# Patient Record
Sex: Female | Born: 1945 | Race: White | Hispanic: No | Marital: Single | Smoking: Former smoker
Health system: Southern US, Community
[De-identification: ages and names within clinical notes are randomized; demographics above are authoritative.]

## PROBLEM LIST (undated history)

## (undated) DIAGNOSIS — R7303 Prediabetes: Secondary | ICD-10-CM

## (undated) DIAGNOSIS — N189 Chronic kidney disease, unspecified: Secondary | ICD-10-CM

## (undated) DIAGNOSIS — N183 Chronic kidney disease, stage 3 unspecified: Secondary | ICD-10-CM

## (undated) DIAGNOSIS — J309 Allergic rhinitis, unspecified: Secondary | ICD-10-CM

## (undated) DIAGNOSIS — I491 Atrial premature depolarization: Secondary | ICD-10-CM

## (undated) DIAGNOSIS — M810 Age-related osteoporosis without current pathological fracture: Secondary | ICD-10-CM

## (undated) DIAGNOSIS — I10 Essential (primary) hypertension: Secondary | ICD-10-CM

## (undated) DIAGNOSIS — G4733 Obstructive sleep apnea (adult) (pediatric): Secondary | ICD-10-CM

## (undated) DIAGNOSIS — J449 Chronic obstructive pulmonary disease, unspecified: Secondary | ICD-10-CM

## (undated) DIAGNOSIS — M47816 Spondylosis without myelopathy or radiculopathy, lumbar region: Secondary | ICD-10-CM

## (undated) DIAGNOSIS — J9690 Respiratory failure, unspecified, unspecified whether with hypoxia or hypercapnia: Secondary | ICD-10-CM

## (undated) DIAGNOSIS — M5136 Other intervertebral disc degeneration, lumbar region: Secondary | ICD-10-CM

## (undated) DIAGNOSIS — E538 Deficiency of other specified B group vitamins: Secondary | ICD-10-CM

## (undated) DIAGNOSIS — E559 Vitamin D deficiency, unspecified: Secondary | ICD-10-CM

## (undated) DIAGNOSIS — R0902 Hypoxemia: Secondary | ICD-10-CM

## (undated) DIAGNOSIS — K219 Gastro-esophageal reflux disease without esophagitis: Secondary | ICD-10-CM

## (undated) DIAGNOSIS — D631 Anemia in chronic kidney disease: Secondary | ICD-10-CM

## (undated) DIAGNOSIS — F5104 Psychophysiologic insomnia: Secondary | ICD-10-CM

## (undated) DIAGNOSIS — M48061 Spinal stenosis, lumbar region without neurogenic claudication: Secondary | ICD-10-CM

## (undated) DIAGNOSIS — E785 Hyperlipidemia, unspecified: Secondary | ICD-10-CM

## (undated) DIAGNOSIS — I83819 Varicose veins of unspecified lower extremities with pain: Secondary | ICD-10-CM

## (undated) DIAGNOSIS — I872 Venous insufficiency (chronic) (peripheral): Secondary | ICD-10-CM

## (undated) DIAGNOSIS — Z9981 Dependence on supplemental oxygen: Secondary | ICD-10-CM

## (undated) HISTORY — DX: Hyperlipidemia, unspecified: E78.5

## (undated) HISTORY — DX: Prediabetes: R73.03

## (undated) HISTORY — DX: Hypoxemia: R09.02

## (undated) HISTORY — DX: Vitamin D deficiency, unspecified: E55.9

## (undated) HISTORY — PX: PARTIAL HYSTERECTOMY: SHX80

## (undated) HISTORY — DX: Atrial premature depolarization: I49.1

## (undated) HISTORY — DX: Deficiency of other specified B group vitamins: E53.8

## (undated) HISTORY — DX: Varicose veins of unspecified lower extremity with pain: I83.819

## (undated) HISTORY — DX: Spondylosis without myelopathy or radiculopathy, lumbar region: M47.816

## (undated) HISTORY — DX: Chronic obstructive pulmonary disease, unspecified: J44.9

## (undated) HISTORY — DX: Chronic kidney disease, stage 3 unspecified: N18.30

## (undated) HISTORY — DX: Spinal stenosis, lumbar region without neurogenic claudication: M48.061

## (undated) HISTORY — DX: Venous insufficiency (chronic) (peripheral): I87.2

## (undated) HISTORY — DX: Allergic rhinitis, unspecified: J30.9

## (undated) HISTORY — DX: Essential (primary) hypertension: I10

## (undated) HISTORY — DX: Obstructive sleep apnea (adult) (pediatric): G47.33

## (undated) HISTORY — DX: Chronic kidney disease, unspecified: N18.9

## (undated) HISTORY — DX: Chronic kidney disease, stage 3 (moderate): N18.3

## (undated) HISTORY — DX: Anemia in chronic kidney disease: D63.1

## (undated) HISTORY — DX: Gastro-esophageal reflux disease without esophagitis: K21.9

## (undated) HISTORY — DX: Age-related osteoporosis without current pathological fracture: M81.0

## (undated) HISTORY — DX: Respiratory failure, unspecified, unspecified whether with hypoxia or hypercapnia: J96.90

## (undated) HISTORY — DX: Other intervertebral disc degeneration, lumbar region: M51.36

## (undated) HISTORY — DX: Psychophysiologic insomnia: F51.04

---

## 2006-05-04 ENCOUNTER — Ambulatory Visit: Payer: Self-pay | Admitting: Cardiology

## 2006-05-25 ENCOUNTER — Ambulatory Visit: Payer: Self-pay

## 2006-06-01 ENCOUNTER — Ambulatory Visit: Payer: Self-pay | Admitting: Cardiology

## 2006-06-15 ENCOUNTER — Ambulatory Visit: Payer: Self-pay | Admitting: Cardiology

## 2014-03-09 LAB — PULMONARY FUNCTION TEST

## 2014-12-07 DIAGNOSIS — J449 Chronic obstructive pulmonary disease, unspecified: Secondary | ICD-10-CM | POA: Diagnosis not present

## 2014-12-08 DIAGNOSIS — D649 Anemia, unspecified: Secondary | ICD-10-CM | POA: Diagnosis not present

## 2014-12-09 DIAGNOSIS — J449 Chronic obstructive pulmonary disease, unspecified: Secondary | ICD-10-CM | POA: Diagnosis not present

## 2014-12-11 DIAGNOSIS — J449 Chronic obstructive pulmonary disease, unspecified: Secondary | ICD-10-CM | POA: Diagnosis not present

## 2014-12-14 DIAGNOSIS — J449 Chronic obstructive pulmonary disease, unspecified: Secondary | ICD-10-CM | POA: Diagnosis not present

## 2014-12-16 DIAGNOSIS — J449 Chronic obstructive pulmonary disease, unspecified: Secondary | ICD-10-CM | POA: Diagnosis not present

## 2014-12-18 DIAGNOSIS — J9610 Chronic respiratory failure, unspecified whether with hypoxia or hypercapnia: Secondary | ICD-10-CM | POA: Diagnosis not present

## 2014-12-18 DIAGNOSIS — Z6841 Body Mass Index (BMI) 40.0 and over, adult: Secondary | ICD-10-CM | POA: Diagnosis not present

## 2014-12-18 DIAGNOSIS — J209 Acute bronchitis, unspecified: Secondary | ICD-10-CM | POA: Diagnosis not present

## 2014-12-18 DIAGNOSIS — J449 Chronic obstructive pulmonary disease, unspecified: Secondary | ICD-10-CM | POA: Diagnosis not present

## 2014-12-19 DIAGNOSIS — M899 Disorder of bone, unspecified: Secondary | ICD-10-CM | POA: Diagnosis not present

## 2014-12-19 DIAGNOSIS — J449 Chronic obstructive pulmonary disease, unspecified: Secondary | ICD-10-CM | POA: Diagnosis not present

## 2014-12-19 DIAGNOSIS — M47817 Spondylosis without myelopathy or radiculopathy, lumbosacral region: Secondary | ICD-10-CM | POA: Diagnosis not present

## 2014-12-19 DIAGNOSIS — R0902 Hypoxemia: Secondary | ICD-10-CM | POA: Diagnosis not present

## 2014-12-21 DIAGNOSIS — J449 Chronic obstructive pulmonary disease, unspecified: Secondary | ICD-10-CM | POA: Diagnosis not present

## 2014-12-23 DIAGNOSIS — J449 Chronic obstructive pulmonary disease, unspecified: Secondary | ICD-10-CM | POA: Diagnosis not present

## 2014-12-29 ENCOUNTER — Institutional Professional Consult (permissible substitution): Payer: Self-pay | Admitting: Critical Care Medicine

## 2014-12-30 DIAGNOSIS — J449 Chronic obstructive pulmonary disease, unspecified: Secondary | ICD-10-CM | POA: Diagnosis not present

## 2015-01-01 DIAGNOSIS — J449 Chronic obstructive pulmonary disease, unspecified: Secondary | ICD-10-CM | POA: Diagnosis not present

## 2015-01-04 DIAGNOSIS — J449 Chronic obstructive pulmonary disease, unspecified: Secondary | ICD-10-CM | POA: Diagnosis not present

## 2015-01-08 DIAGNOSIS — J449 Chronic obstructive pulmonary disease, unspecified: Secondary | ICD-10-CM | POA: Diagnosis not present

## 2015-01-11 DIAGNOSIS — J449 Chronic obstructive pulmonary disease, unspecified: Secondary | ICD-10-CM | POA: Diagnosis not present

## 2015-01-12 ENCOUNTER — Institutional Professional Consult (permissible substitution): Payer: Self-pay | Admitting: Critical Care Medicine

## 2015-01-13 DIAGNOSIS — J449 Chronic obstructive pulmonary disease, unspecified: Secondary | ICD-10-CM | POA: Diagnosis not present

## 2015-01-15 DIAGNOSIS — J449 Chronic obstructive pulmonary disease, unspecified: Secondary | ICD-10-CM | POA: Diagnosis not present

## 2015-01-15 DIAGNOSIS — J9610 Chronic respiratory failure, unspecified whether with hypoxia or hypercapnia: Secondary | ICD-10-CM | POA: Diagnosis not present

## 2015-01-19 ENCOUNTER — Ambulatory Visit (INDEPENDENT_AMBULATORY_CARE_PROVIDER_SITE_OTHER): Payer: Self-pay | Admitting: Critical Care Medicine

## 2015-01-19 ENCOUNTER — Encounter: Payer: Self-pay | Admitting: Critical Care Medicine

## 2015-01-19 VITALS — BP 132/62 | HR 67 | Temp 97.8°F | Ht 62.0 in | Wt 259.0 lb

## 2015-01-19 DIAGNOSIS — N183 Chronic kidney disease, stage 3 (moderate): Secondary | ICD-10-CM | POA: Diagnosis not present

## 2015-01-19 DIAGNOSIS — E785 Hyperlipidemia, unspecified: Secondary | ICD-10-CM

## 2015-01-19 DIAGNOSIS — J9612 Chronic respiratory failure with hypercapnia: Secondary | ICD-10-CM | POA: Diagnosis not present

## 2015-01-19 DIAGNOSIS — M47817 Spondylosis without myelopathy or radiculopathy, lumbosacral region: Secondary | ICD-10-CM | POA: Diagnosis not present

## 2015-01-19 DIAGNOSIS — J418 Mixed simple and mucopurulent chronic bronchitis: Secondary | ICD-10-CM

## 2015-01-19 DIAGNOSIS — E538 Deficiency of other specified B group vitamins: Secondary | ICD-10-CM

## 2015-01-19 DIAGNOSIS — I872 Venous insufficiency (chronic) (peripheral): Secondary | ICD-10-CM

## 2015-01-19 DIAGNOSIS — G4733 Obstructive sleep apnea (adult) (pediatric): Secondary | ICD-10-CM | POA: Diagnosis not present

## 2015-01-19 DIAGNOSIS — Z6841 Body Mass Index (BMI) 40.0 and over, adult: Secondary | ICD-10-CM | POA: Diagnosis not present

## 2015-01-19 DIAGNOSIS — Z1389 Encounter for screening for other disorder: Secondary | ICD-10-CM | POA: Diagnosis not present

## 2015-01-19 DIAGNOSIS — I10 Essential (primary) hypertension: Secondary | ICD-10-CM | POA: Diagnosis not present

## 2015-01-19 DIAGNOSIS — J449 Chronic obstructive pulmonary disease, unspecified: Secondary | ICD-10-CM | POA: Diagnosis not present

## 2015-01-19 DIAGNOSIS — Z9181 History of falling: Secondary | ICD-10-CM | POA: Diagnosis not present

## 2015-01-19 DIAGNOSIS — R0902 Hypoxemia: Secondary | ICD-10-CM | POA: Diagnosis not present

## 2015-01-19 DIAGNOSIS — N189 Chronic kidney disease, unspecified: Secondary | ICD-10-CM | POA: Diagnosis not present

## 2015-01-19 DIAGNOSIS — M899 Disorder of bone, unspecified: Secondary | ICD-10-CM | POA: Diagnosis not present

## 2015-01-19 DIAGNOSIS — K219 Gastro-esophageal reflux disease without esophagitis: Secondary | ICD-10-CM | POA: Insufficient documentation

## 2015-01-19 DIAGNOSIS — R7303 Prediabetes: Secondary | ICD-10-CM

## 2015-01-19 DIAGNOSIS — I83813 Varicose veins of bilateral lower extremities with pain: Secondary | ICD-10-CM

## 2015-01-19 NOTE — Patient Instructions (Signed)
Stay on symbicort two puff twice daily Stay on spiriva respimat two puff daily We will get download on trilogy system and get apria to work with you Stay on oxygen Stay in pulmonary rehab Use spacer with symbicort Nurse will work with you on inhaler use with spacer Return 2 months We will get records of lung function tests and sleep study from dr Alcide Clever

## 2015-01-19 NOTE — Assessment & Plan Note (Signed)
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

## 2015-01-19 NOTE — Progress Notes (Signed)
Subjective:    Patient ID: Amanda Horton, female    DOB: 1946-06-09, 69 y.o.   MRN: [id number]  HPI Comments: Pt was in [place] November, then SNF rehab and now just d/c to home and in pulm rehab.  Prior resp failure 2014 and vent support then hosp 10/2014 and bipap used.  Pt back to snf for 20days. Pt has trilogy, hard time with the device and is smothering the pt.  [employer] is home care company. 2L with trilogy qhs and 2L during the day.    Shortness of Breath This is a chronic problem. The current episode started more than 1 year ago. The problem occurs constantly (worse with exertion, also at rest). The problem has been unchanged. Associated symptoms include orthopnea, PND and wheezing. Pertinent negatives include no abdominal pain, chest pain, claudication, fever, rhinorrhea, sore throat or sputum production. The symptoms are aggravated by any activity, lying flat, fumes, odors and weather changes. Risk factors include smoking. Her past medical history is significant for COPD and pneumonia. There is no history of asthma, CAD, DVT, a heart failure or PE.    Past Medical History  Diagnosis Date  . Lumbar disc narrowing   . Respiratory failure requiring intubation   . Vitamin B12 deficiency   . Hypoxia   . Borderline diabetes   . Chronic insomnia   . Hyperlipemia   . Allergic rhinitis   . Spinal stenosis of lumbar region without neurogenic claudication   . Vitamin D deficiency   . GERD (gastroesophageal reflux disease)   . APC (atrial premature contractions)   . Acute hypokalemia   . COPD (chronic obstructive pulmonary disease)   . Osteoporosis   . Acute on chronic renal failure   . Anemia due to chronic kidney disease   . Benign essential hypertension   . OSA (obstructive sleep apnea)   . Acute hyperkalemia   . Arthritis of lumbar spine   . Varicose veins with pain   . Chronic venous insufficiency      Family History  Problem Relation Age of Onset  . Emphysema Father    . Asthma Father   . Throat cancer Mother      History   Social History  . Marital Status: Single    Spouse Name: N/A  . Number of Children: N/A  . Years of Education: N/A   Occupational History  . Retired     [employer]   Social History Main Topics  . Smoking status: Former Smoker -- 1.00 packs/day for 55 years    Types: Cigarettes    Quit date: 12/04/2012  . Smokeless tobacco: Never Used  . Alcohol Use: No  . Drug Use: No  . Sexual Activity: Not on file   Other Topics Concern  . Not on file   Social History Narrative  . No narrative on file     No Known Allergies   No outpatient prescriptions prior to visit.   No facility-administered medications prior to visit.      Review of Systems  Constitutional: Positive for fatigue. Negative for fever.  HENT: Negative for postnasal drip, rhinorrhea, sinus pressure, sore throat and trouble swallowing.   Respiratory: Positive for cough, chest tightness, shortness of breath and wheezing. Negative for apnea, sputum production and choking.   Cardiovascular: Positive for orthopnea and PND. Negative for chest pain and claudication.  Gastrointestinal: Negative.  Negative for abdominal pain.       Objective:   Physical Exam Filed Vitals:  01/19/15 1101 01/19/15 1115  BP:  132/62  Pulse: 88 67  Temp:  97.8 F (36.6 C)  TempSrc:  Oral  Height:  5\' 2"  (1.575 m)  Weight:  259 lb (117.482 kg)  SpO2: 83% 93%    Gen: Pleasant, well-nourished, in no distress,  normal affect  ENT: No lesions,  mouth clear,  oropharynx clear, no postnasal drip  Neck: No JVD, no TMG, no carotid bruits  Lungs: No use of accessory muscles, no dullness to percussion, distant Bs  Cardiovascular: RRR, heart sounds normal, no murmur or gallops, no peripheral edema  Abdomen: soft and NT, no HSM,  BS normal  Musculoskeletal: No deformities, no cyanosis or clubbing  Neuro: alert, non focal  Skin: Warm, no lesions or rashes  No  results found.      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 Acetaminophen 500 MG coapsule Take 3 capsules by mouth as needed.  Marland Kitchen albuterol (PROVENTIL HFA;VENTOLIN HFA) 108 (90 BASE) MCG/ACT inhaler Inhale 2 puffs into the lungs every 6 (six) hours as needed for wheezing or shortness of breath.  Marland Kitchen alendronate (FOSAMAX) 70 MG tablet Take 1 tablet by mouth every 7 (seven) days.  . Calcium Carbonate-Vitamin D 600-400 MG-UNIT per tablet Take 1 tablet by mouth 2 (two) times daily.  . Cholecalciferol (VITAMIN D PO) Take 5,000 Units by mouth daily.  . cyanocobalamin 500 MCG tablet Take 500 mcg by mouth daily.  . furosemide (LASIX) 80 MG tablet Take 1 tablet by mouth daily.  Marland Kitchen ipratropium-albuterol (DUONEB) 0.5-2.5 (3) MG/3ML SOLN Take 3 mLs by nebulization every 6 (six) hours as needed.  . metoprolol tartrate (LOPRESSOR) 25 MG tablet Take 1 tablet by mouth 2 (two) times daily.  Marland Kitchen omeprazole (PRILOSEC) 40 MG capsule Take 1 capsule by mouth daily.  . potassium chloride SA (K-DUR,KLOR-CON) 20 MEQ tablet Take 1 tablet by mouth 2 (two) times daily.  . ranitidine (ZANTAC) 150 MG tablet Take 1 tablet by mouth 2 (two) times daily.  . SYMBICORT 160-4.5 MCG/ACT inhaler Inhale 2 puffs into the lungs 2 (two) times daily.  . temazepam (RESTORIL) 15 MG capsule Take 1 capsule by mouth at bedtime as needed.  .  Tiotropium Bromide Monohydrate 2.5 MCG/ACT AERS Inhale 2 puffs into the lungs daily.  . Vitamin D, Ergocalciferol, (DRISDOL) 50000 UNITS CAPS capsule Take 1 capsule by mouth every 7 (seven) days.

## 2015-01-20 DIAGNOSIS — J449 Chronic obstructive pulmonary disease, unspecified: Secondary | ICD-10-CM | POA: Diagnosis not present

## 2015-01-22 ENCOUNTER — Telehealth: Payer: Self-pay | Admitting: Critical Care Medicine

## 2015-01-22 DIAGNOSIS — J449 Chronic obstructive pulmonary disease, unspecified: Secondary | ICD-10-CM | POA: Diagnosis not present

## 2015-01-22 NOTE — Telephone Encounter (Signed)
Rec'd [facility] forward 24 pages to Dr. Joya Gaskins

## 2015-01-25 DIAGNOSIS — J449 Chronic obstructive pulmonary disease, unspecified: Secondary | ICD-10-CM | POA: Diagnosis not present

## 2015-01-29 DIAGNOSIS — J449 Chronic obstructive pulmonary disease, unspecified: Secondary | ICD-10-CM | POA: Diagnosis not present

## 2015-02-01 DIAGNOSIS — J449 Chronic obstructive pulmonary disease, unspecified: Secondary | ICD-10-CM | POA: Diagnosis not present

## 2015-02-03 DIAGNOSIS — J449 Chronic obstructive pulmonary disease, unspecified: Secondary | ICD-10-CM | POA: Diagnosis not present

## 2015-02-08 ENCOUNTER — Telehealth: Payer: Self-pay | Admitting: Critical Care Medicine

## 2015-02-08 DIAGNOSIS — J449 Chronic obstructive pulmonary disease, unspecified: Secondary | ICD-10-CM | POA: Diagnosis not present

## 2015-02-08 DIAGNOSIS — G4733 Obstructive sleep apnea (adult) (pediatric): Secondary | ICD-10-CM

## 2015-02-08 NOTE — Telephone Encounter (Signed)
Spoke with pt - Discussed below per Dr. Joya Gaskins.   Pt states she cannot sleep with Trilogy because it gives "bursts of air."  Pt states it is difficult to tolerate this. She would like to change to bipap or cpap.   Dr. Joya Gaskins, pls advise.  Thank you.

## 2015-02-08 NOTE — Telephone Encounter (Signed)
Can [employer] go out to the house and work with the pt on the trilogy,  If not, then place an order for Cpap [id number]

## 2015-02-08 NOTE — Telephone Encounter (Signed)
Let pt know i have received download and pt with adequate settings on trilogy and good compliance No recommendations made

## 2015-02-08 NOTE — Telephone Encounter (Signed)
Called spoke with [place] from [employer]. She will see if a tech can go out to pt home and "troubleshoot" if not they will let us know so we can place order for CPAP. [employer] is going to call pt. I called made pt aware. Nothing further needed

## 2015-02-08 NOTE — Telephone Encounter (Signed)
lmomtcb for pt on home and cell #s 

## 2015-02-12 DIAGNOSIS — J449 Chronic obstructive pulmonary disease, unspecified: Secondary | ICD-10-CM | POA: Diagnosis not present

## 2015-02-13 DIAGNOSIS — J9610 Chronic respiratory failure, unspecified whether with hypoxia or hypercapnia: Secondary | ICD-10-CM | POA: Diagnosis not present

## 2015-02-13 DIAGNOSIS — J449 Chronic obstructive pulmonary disease, unspecified: Secondary | ICD-10-CM | POA: Diagnosis not present

## 2015-02-15 DIAGNOSIS — J449 Chronic obstructive pulmonary disease, unspecified: Secondary | ICD-10-CM | POA: Diagnosis not present

## 2015-02-15 DIAGNOSIS — J440 Chronic obstructive pulmonary disease with acute lower respiratory infection: Secondary | ICD-10-CM | POA: Diagnosis not present

## 2015-02-15 DIAGNOSIS — Z6841 Body Mass Index (BMI) 40.0 and over, adult: Secondary | ICD-10-CM | POA: Diagnosis not present

## 2015-02-17 DIAGNOSIS — R0902 Hypoxemia: Secondary | ICD-10-CM | POA: Diagnosis not present

## 2015-02-17 DIAGNOSIS — M47817 Spondylosis without myelopathy or radiculopathy, lumbosacral region: Secondary | ICD-10-CM | POA: Diagnosis not present

## 2015-02-17 DIAGNOSIS — M899 Disorder of bone, unspecified: Secondary | ICD-10-CM | POA: Diagnosis not present

## 2015-02-17 DIAGNOSIS — J449 Chronic obstructive pulmonary disease, unspecified: Secondary | ICD-10-CM | POA: Diagnosis not present

## 2015-03-01 ENCOUNTER — Telehealth: Payer: Self-pay | Admitting: Critical Care Medicine

## 2015-03-01 NOTE — Telephone Encounter (Addendum)
ATC pt. Unable to leave VM. WCB. Spoke with Crystal , she has not tried to call pt as it would be documented.

## 2015-03-02 NOTE — Telephone Encounter (Signed)
Spoke with the pt and she states that this was an old msg that she had received  There is nothing needed

## 2015-03-02 NOTE — Telephone Encounter (Signed)
LMTCB

## 2015-03-03 DIAGNOSIS — J449 Chronic obstructive pulmonary disease, unspecified: Secondary | ICD-10-CM | POA: Diagnosis not present

## 2015-03-04 DIAGNOSIS — J440 Chronic obstructive pulmonary disease with acute lower respiratory infection: Secondary | ICD-10-CM | POA: Diagnosis not present

## 2015-03-04 DIAGNOSIS — R05 Cough: Secondary | ICD-10-CM | POA: Diagnosis not present

## 2015-03-04 DIAGNOSIS — J449 Chronic obstructive pulmonary disease, unspecified: Secondary | ICD-10-CM | POA: Diagnosis not present

## 2015-03-04 DIAGNOSIS — Z6841 Body Mass Index (BMI) 40.0 and over, adult: Secondary | ICD-10-CM | POA: Diagnosis not present

## 2015-03-16 DIAGNOSIS — J449 Chronic obstructive pulmonary disease, unspecified: Secondary | ICD-10-CM | POA: Diagnosis not present

## 2015-03-16 DIAGNOSIS — J9610 Chronic respiratory failure, unspecified whether with hypoxia or hypercapnia: Secondary | ICD-10-CM | POA: Diagnosis not present

## 2015-03-19 DIAGNOSIS — Z23 Encounter for immunization: Secondary | ICD-10-CM | POA: Diagnosis not present

## 2015-03-19 DIAGNOSIS — Z6841 Body Mass Index (BMI) 40.0 and over, adult: Secondary | ICD-10-CM | POA: Diagnosis not present

## 2015-03-19 DIAGNOSIS — S81801A Unspecified open wound, right lower leg, initial encounter: Secondary | ICD-10-CM | POA: Diagnosis not present

## 2015-03-20 DIAGNOSIS — R0902 Hypoxemia: Secondary | ICD-10-CM | POA: Diagnosis not present

## 2015-03-20 DIAGNOSIS — M899 Disorder of bone, unspecified: Secondary | ICD-10-CM | POA: Diagnosis not present

## 2015-03-20 DIAGNOSIS — M47817 Spondylosis without myelopathy or radiculopathy, lumbosacral region: Secondary | ICD-10-CM | POA: Diagnosis not present

## 2015-03-20 DIAGNOSIS — J449 Chronic obstructive pulmonary disease, unspecified: Secondary | ICD-10-CM | POA: Diagnosis not present

## 2015-03-23 ENCOUNTER — Encounter: Payer: Self-pay | Admitting: Critical Care Medicine

## 2015-03-23 ENCOUNTER — Ambulatory Visit (INDEPENDENT_AMBULATORY_CARE_PROVIDER_SITE_OTHER): Payer: Self-pay | Admitting: Critical Care Medicine

## 2015-03-23 VITALS — BP 132/82 | HR 81 | Temp 98.1°F | Ht 62.0 in | Wt 257.6 lb

## 2015-03-23 DIAGNOSIS — J418 Mixed simple and mucopurulent chronic bronchitis: Secondary | ICD-10-CM

## 2015-03-23 DIAGNOSIS — J449 Chronic obstructive pulmonary disease, unspecified: Secondary | ICD-10-CM | POA: Diagnosis not present

## 2015-03-23 DIAGNOSIS — J9612 Chronic respiratory failure with hypercapnia: Secondary | ICD-10-CM | POA: Diagnosis not present

## 2015-03-23 MED ORDER — CIPROFLOXACIN HCL 750 MG PO TABS: 750.0000 mg | ORAL_TABLET | Freq: Two times a day (BID) | ORAL | Status: DC

## 2015-03-23 MED ORDER — PREDNISONE 10 MG PO TABS: ORAL_TABLET | ORAL | Status: DC

## 2015-03-23 NOTE — Patient Instructions (Addendum)
Resume prednisone 10mg  Take 4 for three days 3 for three days 2 for three days 1 for three days and stop Cipro 750mg  twice daily  Stop fosamax for now We will try to get bipap for you No other changes Return 6 weeks

## 2015-03-23 NOTE — Progress Notes (Signed)
Subjective:    Patient ID: Amanda Horton, female    DOB: September 24, 1946, 69 y.o.   MRN: [id number]  HPI Comments: Pt was in [place] November, then SNF rehab and now just d/c to home and in pulm rehab.  Prior resp failure 2014 and vent support then hosp 10/2014 and bipap used.  Pt back to snf for 20days. Pt has trilogy, hard time with the device and is smothering the pt.  [employer] [facility] is home care company. 2L with trilogy qhs and 2L during the day.    Shortness of Breath This is a chronic problem. The current episode started more than 1 year ago. The problem occurs constantly (worse with exertion, also at rest). The problem has been unchanged. Associated symptoms include orthopnea, PND and wheezing. Pertinent negatives include no abdominal pain, chest pain, claudication, fever, rhinorrhea, sore throat or sputum production. The symptoms are aggravated by any activity, lying flat, fumes, odors and weather changes. Risk factors include smoking. Her past medical history is significant for COPD and pneumonia. There is no history of asthma, CAD, DVT, a heart failure or PE.   03/23/2015 Chief Complaint  Patient presents with  . Follow-up    c/o sob worse with exertion x 2-3 wks.,?wheezing,cough-yellow,rattling with cough,chest tightness,no fcs.Had bronchitis 2-3 wks. ago tx'd by primary.2 rounds of Prednisone,Prednisone shot,Abx.Left leg hit walker-tore skin.Not going to Pulm. Rehab.Not sleeping well with Triology,said BIPAP works better.   Pt with progressive cough and mucus.  Likes bipap better.  More mucus production. No f/c/s Pt denies any significant sore throat, nasal congestion or excess secretions, fever, chills, sweats, unintended weight loss, pleurtic or exertional chest pain, orthopnea PND, or leg swelling Pt denies any increase in rescue therapy over baseline, denies waking up needing it or having any early am or nocturnal exacerbations of coughing/wheezing/or dyspnea. Pt also denies any obvious  fluctuation in symptoms with  weather or environmental change or other alleviating or aggravating factors  Review of Systems  Constitutional: Positive for fatigue. Negative for fever.  HENT: Negative for postnasal drip, rhinorrhea, sinus pressure, sore throat and trouble swallowing.   Respiratory: Positive for cough, chest tightness, shortness of breath and wheezing. Negative for apnea, sputum production and choking.   Cardiovascular: Positive for orthopnea and PND. Negative for chest pain and claudication.  Gastrointestinal: Negative.  Negative for abdominal pain.       Objective:   Physical Exam Filed Vitals:   03/23/15 1158  BP: 132/82  Pulse: 81  Temp: 98.1 F (36.7 C)  TempSrc: Oral  Height: 5\' 2"  (1.575 m)  Weight: 257 lb 9.6 oz (116.847 kg)  SpO2: 92%    Gen: Pleasant, well-nourished, in no distress,  normal affect  ENT: No lesions,  mouth clear,  oropharynx clear, no postnasal drip  Neck: No JVD, no TMG, no carotid bruits  Lungs: No use of accessory muscles, no dullness to percussion, distant Bs  Cardiovascular: RRR, heart sounds normal, no murmur or gallops, no peripheral edema  Abdomen: soft and NT, no HSM,  BS normal  Musculoskeletal: No deformities, no cyanosis or clubbing  Neuro: alert, non focal  Skin: Warm, no lesions or rashes  No results found.      Assessment & Plan:   COPD (chronic obstructive pulmonary disease) Chronic airway obstruction Increased airway inlfammation Fosamax ppt cough Plan Resume prednisone 10mg  Take 4 for three days 3 for three days 2 for three days 1 for three days and stop Cipro 750mg  twice daily  Stop fosamax  for now We will try to get bipap for you No other changes Return 6 weeks      Updated Medication List Outpatient Encounter Prescriptions as of 03/23/2015  Medication Sig  . ACCU-CHEK FASTCLIX LANCETS MISC   . ACCU-CHEK SMARTVIEW test strip   . Acetaminophen 500 MG coapsule Take 3 capsules by mouth as  needed.  Marland Kitchen albuterol (PROVENTIL HFA;VENTOLIN HFA) 108 (90 BASE) MCG/ACT inhaler Inhale 2 puffs into the lungs every 6 (six) hours as needed for wheezing or shortness of breath.  . Calcium Carbonate-Vitamin D 600-400 MG-UNIT per tablet Take 1 tablet by mouth 2 (two) times daily.  . Cholecalciferol (VITAMIN D PO) Take 5,000 Units by mouth daily.  . cyanocobalamin 500 MCG tablet Take 500 mcg by mouth daily.  . furosemide (LASIX) 80 MG tablet Take 1 tablet by mouth daily.  Marland Kitchen ipratropium-albuterol (DUONEB) 0.5-2.5 (3) MG/3ML SOLN Take 3 mLs by nebulization every 6 (six) hours as needed.  . metoprolol tartrate (LOPRESSOR) 25 MG tablet Take 1 tablet by mouth 2 (two) times daily.  Marland Kitchen omeprazole (PRILOSEC) 40 MG capsule Take 1 capsule by mouth daily.  . potassium chloride SA (K-DUR,KLOR-CON) 20 MEQ tablet Take 1 tablet by mouth 2 (two) times daily.  . ranitidine (ZANTAC) 150 MG tablet Take 1 tablet by mouth 2 (two) times daily.  . SYMBICORT 160-4.5 MCG/ACT inhaler Inhale 2 puffs into the lungs 2 (two) times daily.  . temazepam (RESTORIL) 15 MG capsule Take 1 capsule by mouth at bedtime as needed.  . Tiotropium Bromide Monohydrate 2.5 MCG/ACT AERS Inhale 2 puffs into the lungs daily.  . Vitamin D, Ergocalciferol, (DRISDOL) 50000 UNITS CAPS capsule Take 1 capsule by mouth every 7 (seven) days.  . [DISCONTINUED] alendronate (FOSAMAX) 70 MG tablet Take 1 tablet by mouth every 7 (seven) days.  . ciprofloxacin (CIPRO) 750 MG tablet Take 1 tablet (750 mg total) by mouth 2 (two) times daily.  . predniSONE (DELTASONE) 10 MG tablet Take 4 for three days 3 for three days 2 for three days 1 for three days and stop

## 2015-03-24 NOTE — Assessment & Plan Note (Signed)
Chronic airway obstruction Increased airway inlfammation Fosamax ppt cough Plan Resume prednisone 10mg  Take 4 for three days 3 for three days 2 for three days 1 for three days and stop Cipro 750mg  twice daily  Stop fosamax for now We will try to get bipap for you No other changes Return 6 weeks

## 2015-03-26 DIAGNOSIS — N189 Chronic kidney disease, unspecified: Secondary | ICD-10-CM | POA: Diagnosis not present

## 2015-03-26 DIAGNOSIS — D631 Anemia in chronic kidney disease: Secondary | ICD-10-CM | POA: Diagnosis not present

## 2015-04-13 DIAGNOSIS — J9612 Chronic respiratory failure with hypercapnia: Secondary | ICD-10-CM | POA: Diagnosis not present

## 2015-04-15 DIAGNOSIS — J449 Chronic obstructive pulmonary disease, unspecified: Secondary | ICD-10-CM | POA: Diagnosis not present

## 2015-04-15 DIAGNOSIS — S81801S Unspecified open wound, right lower leg, sequela: Secondary | ICD-10-CM | POA: Diagnosis not present

## 2015-04-15 DIAGNOSIS — L03115 Cellulitis of right lower limb: Secondary | ICD-10-CM | POA: Diagnosis not present

## 2015-04-15 DIAGNOSIS — I890 Lymphedema, not elsewhere classified: Secondary | ICD-10-CM | POA: Diagnosis not present

## 2015-04-15 DIAGNOSIS — Z6841 Body Mass Index (BMI) 40.0 and over, adult: Secondary | ICD-10-CM | POA: Diagnosis not present

## 2015-04-19 DIAGNOSIS — M47817 Spondylosis without myelopathy or radiculopathy, lumbosacral region: Secondary | ICD-10-CM | POA: Diagnosis not present

## 2015-04-19 DIAGNOSIS — J449 Chronic obstructive pulmonary disease, unspecified: Secondary | ICD-10-CM | POA: Diagnosis not present

## 2015-04-19 DIAGNOSIS — M899 Disorder of bone, unspecified: Secondary | ICD-10-CM | POA: Diagnosis not present

## 2015-04-19 DIAGNOSIS — R0902 Hypoxemia: Secondary | ICD-10-CM | POA: Diagnosis not present

## 2015-04-22 ENCOUNTER — Telehealth: Payer: Self-pay | Admitting: Critical Care Medicine

## 2015-04-22 DIAGNOSIS — N189 Chronic kidney disease, unspecified: Secondary | ICD-10-CM | POA: Diagnosis not present

## 2015-04-22 DIAGNOSIS — J418 Mixed simple and mucopurulent chronic bronchitis: Secondary | ICD-10-CM

## 2015-04-22 DIAGNOSIS — I10 Essential (primary) hypertension: Secondary | ICD-10-CM | POA: Diagnosis not present

## 2015-04-22 DIAGNOSIS — F419 Anxiety disorder, unspecified: Secondary | ICD-10-CM | POA: Diagnosis not present

## 2015-04-22 DIAGNOSIS — L03115 Cellulitis of right lower limb: Secondary | ICD-10-CM | POA: Diagnosis not present

## 2015-04-22 DIAGNOSIS — K5909 Other constipation: Secondary | ICD-10-CM | POA: Diagnosis not present

## 2015-04-22 DIAGNOSIS — I890 Lymphedema, not elsewhere classified: Secondary | ICD-10-CM | POA: Diagnosis not present

## 2015-04-22 DIAGNOSIS — J449 Chronic obstructive pulmonary disease, unspecified: Secondary | ICD-10-CM | POA: Diagnosis not present

## 2015-04-22 DIAGNOSIS — N183 Chronic kidney disease, stage 3 (moderate): Secondary | ICD-10-CM | POA: Diagnosis not present

## 2015-04-22 NOTE — Telephone Encounter (Signed)
Increase oxygen during day to 3L continuous

## 2015-04-22 NOTE — Telephone Encounter (Signed)
Spoke with pt and advised of Dr Bettina Gavia recommendations.  Order sent to [facility]

## 2015-04-22 NOTE — Telephone Encounter (Signed)
Spoke to pt.  She is wearing BIPAP average 6hr/night with oxygen bled in.  She states she sometimes wakes up after 3 hrs but is able to go back to sleep.  She states in the mornings when she takes bipap off and switches to regular oxygen her sats are 85-88% and has to take some deep breathes to get levels in the 90's.  Pt states SOB is about the same.  Denies cough or wheezing. Does have some chest congestion but is using Mucinex.  Please advise.

## 2015-04-23 DIAGNOSIS — N183 Chronic kidney disease, stage 3 (moderate): Secondary | ICD-10-CM | POA: Diagnosis not present

## 2015-04-23 DIAGNOSIS — N189 Chronic kidney disease, unspecified: Secondary | ICD-10-CM | POA: Diagnosis not present

## 2015-05-04 ENCOUNTER — Ambulatory Visit (INDEPENDENT_AMBULATORY_CARE_PROVIDER_SITE_OTHER): Payer: Self-pay | Admitting: Critical Care Medicine

## 2015-05-04 ENCOUNTER — Encounter: Payer: Self-pay | Admitting: Critical Care Medicine

## 2015-05-04 VITALS — BP 128/60 | HR 83 | Temp 98.3°F | Ht 63.0 in | Wt 254.8 lb

## 2015-05-04 DIAGNOSIS — G4733 Obstructive sleep apnea (adult) (pediatric): Secondary | ICD-10-CM | POA: Diagnosis not present

## 2015-05-04 DIAGNOSIS — J9612 Chronic respiratory failure with hypercapnia: Secondary | ICD-10-CM

## 2015-05-04 DIAGNOSIS — J449 Chronic obstructive pulmonary disease, unspecified: Secondary | ICD-10-CM | POA: Diagnosis not present

## 2015-05-04 DIAGNOSIS — E662 Morbid (severe) obesity with alveolar hypoventilation: Secondary | ICD-10-CM

## 2015-05-04 DIAGNOSIS — J418 Mixed simple and mucopurulent chronic bronchitis: Secondary | ICD-10-CM

## 2015-05-04 MED ORDER — ALBUTEROL SULFATE (2.5 MG/3ML) 0.083% IN NEBU: 2.5000 mg | INHALATION_SOLUTION | Freq: Four times a day (QID) | RESPIRATORY_TRACT | Status: DC | PRN

## 2015-05-04 NOTE — Patient Instructions (Signed)
We will ask them to adjust your account for the rehab charges and find out why you were overcharged Stop duoneb , start albuterol in nebulizer as needed, Rx sent to pharmacy No other changes A download will be obtained from [place] Return 3 months

## 2015-05-04 NOTE — Progress Notes (Signed)
Subjective:    Patient ID: Amanda Horton, female    DOB: [birth year], 69 y.o.   MRN: [id number]  HPI 05/04/2015 Chief Complaint  Patient presents with  . Follow-up    Feeling the same-sob-same,staying inside,wheezing,cough-tan,saw small amts.streaks of blood 2-3 days, no pnd,no fcs, denies cp or tightness. Using BIPAP now sleeping 5-6 hrs.now.   Pt still wheezing and cough prod tan mucus and blood mixed with mucus.  One time episode of bleeding from throat. No chest pain. Using bipap with oxygen and sleeps 6 hours.   Pt denies any significant sore throat, nasal congestion or excess secretions, fever, chills, sweats, unintended weight loss, pleurtic or exertional chest pain, orthopnea PND, or leg swelling Pt denies any increase in rescue therapy over baseline, denies waking up needing it or having any early am or nocturnal exacerbations of coughing/wheezing/or dyspnea. Pt also denies any obvious fluctuation in symptoms with  weather or environmental change or other alleviating or aggravating factors   Current Medications, Allergies, Complete Past Medical History, Past Surgical History, Family History, and Social History were reviewed in [facility] record per todays encounter:  05/04/2015   Review of Systems  Constitutional: Negative.   HENT: Negative.  Negative for ear pain, postnasal drip, rhinorrhea, sinus pressure, sore throat, trouble swallowing and voice change.   Eyes: Negative.   Respiratory: Positive for cough, chest tightness, shortness of breath and wheezing. Negative for apnea, choking and stridor.   Cardiovascular: Negative.  Negative for chest pain, palpitations and leg swelling.  Gastrointestinal: Negative.  Negative for nausea, vomiting, abdominal pain and abdominal distention.  Genitourinary: Negative.   Musculoskeletal: Negative.  Negative for myalgias and arthralgias.  Skin: Negative.  Negative for rash.  Allergic/Immunologic: Negative.  Negative  for environmental allergies and food allergies.  Neurological: Negative.  Negative for dizziness, syncope, weakness and headaches.  Hematological: Negative.  Negative for adenopathy. Does not bruise/bleed easily.  Psychiatric/Behavioral: Negative.  Negative for sleep disturbance and agitation. The patient is not nervous/anxious.        Objective:   Physical Exam Filed Vitals:   05/04/15 1452  BP: 128/60  Pulse: 83  Temp: 98.3 F (36.8 C)  TempSrc: Oral  Height: 5\' 3"  (1.6 m)  Weight: 254 lb 12.8 oz (115.577 kg)  SpO2: 93%    Gen: Pleasant, well-nourished, in no distress,  normal affect  ENT: No lesions,  mouth clear,  oropharynx clear, no postnasal drip  Neck: No JVD, no TMG, no carotid bruits  Lungs: No use of accessory muscles, no dullness to percussion, distant bs  Cardiovascular: RRR, heart sounds normal, no murmur or gallops, no peripheral edema  Abdomen: soft and NT, no HSM,  BS normal  Musculoskeletal: No deformities, no cyanosis or clubbing  Neuro: alert, non focal  Skin: Warm, no lesions or rashes  No results found.        Assessment & Plan:  I personally reviewed all images and lab data in the [facility] system as well as any outside material available during this office visit and agree with the  radiology impressions.   OSA (obstructive sleep apnea) Need  download from BiPAP will obtain from home care company   COPD (chronic obstructive pulmonary disease) Chronic obstructive lung disease asthmatic bronchitic component Over use of anticholinergic agents resulting in mucus plugging Plan Continue Spiriva Discontinue ipratropium from nebulizer Continued other inhaled medications as prescribed    Lisbeth was seen today for follow-up.  Diagnoses and all orders for this visit:  COPD with chronic bronchitis  OSA (obstructive sleep apnea) Orders: -     AMB REFERRAL FOR DME  Chronic respiratory failure with hypercapnia  Obesity hypoventilation  syndrome  Mixed simple and mucopurulent chronic bronchitis  Other orders -     albuterol (PROVENTIL) (2.5 MG/3ML) 0.083% nebulizer solution; Take 3 mLs (2.5 mg total) by nebulization every 6 (six) hours as needed for wheezing or shortness of breath.

## 2015-05-05 NOTE — Assessment & Plan Note (Signed)
Chronic obstructive lung disease asthmatic bronchitic component Over use of anticholinergic agents resulting in mucus plugging Plan Continue Spiriva Discontinue ipratropium from nebulizer Continued other inhaled medications as prescribed

## 2015-05-05 NOTE — Assessment & Plan Note (Signed)
Need  download from BiPAP will obtain from home care company

## 2015-05-10 ENCOUNTER — Telehealth: Payer: Self-pay | Admitting: Critical Care Medicine

## 2015-05-10 DIAGNOSIS — G4733 Obstructive sleep apnea (adult) (pediatric): Secondary | ICD-10-CM

## 2015-05-10 NOTE — Telephone Encounter (Signed)
Let pt know download shows good compliance and improvement in apneas on bipap No changes in settings:  Optimal settings : Ipap  12  epap 8

## 2015-05-11 NOTE — Telephone Encounter (Signed)
ATC pt's home #, [phone], received msg that this # is disconnected or no longer in service. [place] # listed as pt's cell #, [phone], [email or web address]

## 2015-05-11 NOTE — Telephone Encounter (Signed)
Called, spoke with pt's daughter, Butch Penny.  Discussed below results and recs per Dr. Joya Gaskins.  She verbalized understanding and will inform pt.  She will have pt call back if any questions.  Nothing further needed at this time.

## 2015-05-14 DIAGNOSIS — J9612 Chronic respiratory failure with hypercapnia: Secondary | ICD-10-CM | POA: Diagnosis not present

## 2015-05-20 DIAGNOSIS — J449 Chronic obstructive pulmonary disease, unspecified: Secondary | ICD-10-CM | POA: Diagnosis not present

## 2015-05-20 DIAGNOSIS — M47817 Spondylosis without myelopathy or radiculopathy, lumbosacral region: Secondary | ICD-10-CM | POA: Diagnosis not present

## 2015-05-20 DIAGNOSIS — R0902 Hypoxemia: Secondary | ICD-10-CM | POA: Diagnosis not present

## 2015-05-20 DIAGNOSIS — M899 Disorder of bone, unspecified: Secondary | ICD-10-CM | POA: Diagnosis not present

## 2015-05-31 ENCOUNTER — Encounter: Payer: Self-pay | Admitting: Critical Care Medicine

## 2015-06-09 DIAGNOSIS — Z6841 Body Mass Index (BMI) 40.0 and over, adult: Secondary | ICD-10-CM | POA: Diagnosis not present

## 2015-06-09 DIAGNOSIS — Z139 Encounter for screening, unspecified: Secondary | ICD-10-CM | POA: Diagnosis not present

## 2015-06-09 DIAGNOSIS — I890 Lymphedema, not elsewhere classified: Secondary | ICD-10-CM | POA: Diagnosis not present

## 2015-06-13 DIAGNOSIS — J9612 Chronic respiratory failure with hypercapnia: Secondary | ICD-10-CM | POA: Diagnosis not present

## 2015-06-19 DIAGNOSIS — J449 Chronic obstructive pulmonary disease, unspecified: Secondary | ICD-10-CM | POA: Diagnosis not present

## 2015-06-19 DIAGNOSIS — M899 Disorder of bone, unspecified: Secondary | ICD-10-CM | POA: Diagnosis not present

## 2015-06-19 DIAGNOSIS — R0902 Hypoxemia: Secondary | ICD-10-CM | POA: Diagnosis not present

## 2015-06-19 DIAGNOSIS — M47817 Spondylosis without myelopathy or radiculopathy, lumbosacral region: Secondary | ICD-10-CM | POA: Diagnosis not present

## 2015-06-23 DIAGNOSIS — I890 Lymphedema, not elsewhere classified: Secondary | ICD-10-CM | POA: Diagnosis not present

## 2015-06-23 DIAGNOSIS — Z1389 Encounter for screening for other disorder: Secondary | ICD-10-CM | POA: Diagnosis not present

## 2015-06-23 DIAGNOSIS — Z6841 Body Mass Index (BMI) 40.0 and over, adult: Secondary | ICD-10-CM | POA: Diagnosis not present

## 2015-06-23 DIAGNOSIS — Z9181 History of falling: Secondary | ICD-10-CM | POA: Diagnosis not present

## 2015-07-14 DIAGNOSIS — J9612 Chronic respiratory failure with hypercapnia: Secondary | ICD-10-CM | POA: Diagnosis not present

## 2015-07-20 DIAGNOSIS — M899 Disorder of bone, unspecified: Secondary | ICD-10-CM | POA: Diagnosis not present

## 2015-07-20 DIAGNOSIS — M47817 Spondylosis without myelopathy or radiculopathy, lumbosacral region: Secondary | ICD-10-CM | POA: Diagnosis not present

## 2015-07-20 DIAGNOSIS — J449 Chronic obstructive pulmonary disease, unspecified: Secondary | ICD-10-CM | POA: Diagnosis not present

## 2015-07-20 DIAGNOSIS — R0902 Hypoxemia: Secondary | ICD-10-CM | POA: Diagnosis not present

## 2015-07-29 DIAGNOSIS — Z6841 Body Mass Index (BMI) 40.0 and over, adult: Secondary | ICD-10-CM | POA: Diagnosis not present

## 2015-07-29 DIAGNOSIS — J449 Chronic obstructive pulmonary disease, unspecified: Secondary | ICD-10-CM | POA: Diagnosis not present

## 2015-07-29 DIAGNOSIS — I890 Lymphedema, not elsewhere classified: Secondary | ICD-10-CM | POA: Diagnosis not present

## 2015-07-29 DIAGNOSIS — N183 Chronic kidney disease, stage 3 (moderate): Secondary | ICD-10-CM | POA: Diagnosis not present

## 2015-07-29 DIAGNOSIS — F419 Anxiety disorder, unspecified: Secondary | ICD-10-CM | POA: Diagnosis not present

## 2015-07-29 DIAGNOSIS — N189 Chronic kidney disease, unspecified: Secondary | ICD-10-CM | POA: Diagnosis not present

## 2015-07-29 DIAGNOSIS — Z1231 Encounter for screening mammogram for malignant neoplasm of breast: Secondary | ICD-10-CM | POA: Diagnosis not present

## 2015-07-29 DIAGNOSIS — I10 Essential (primary) hypertension: Secondary | ICD-10-CM | POA: Diagnosis not present

## 2015-08-11 DIAGNOSIS — Z1231 Encounter for screening mammogram for malignant neoplasm of breast: Secondary | ICD-10-CM | POA: Diagnosis not present

## 2015-08-14 DIAGNOSIS — J9612 Chronic respiratory failure with hypercapnia: Secondary | ICD-10-CM | POA: Diagnosis not present

## 2015-08-20 DIAGNOSIS — R0902 Hypoxemia: Secondary | ICD-10-CM | POA: Diagnosis not present

## 2015-08-20 DIAGNOSIS — M899 Disorder of bone, unspecified: Secondary | ICD-10-CM | POA: Diagnosis not present

## 2015-08-20 DIAGNOSIS — J449 Chronic obstructive pulmonary disease, unspecified: Secondary | ICD-10-CM | POA: Diagnosis not present

## 2015-08-20 DIAGNOSIS — M47817 Spondylosis without myelopathy or radiculopathy, lumbosacral region: Secondary | ICD-10-CM | POA: Diagnosis not present

## 2015-09-13 DIAGNOSIS — J9612 Chronic respiratory failure with hypercapnia: Secondary | ICD-10-CM | POA: Diagnosis not present

## 2015-09-16 DIAGNOSIS — J440 Chronic obstructive pulmonary disease with acute lower respiratory infection: Secondary | ICD-10-CM | POA: Diagnosis not present

## 2015-09-19 DIAGNOSIS — J449 Chronic obstructive pulmonary disease, unspecified: Secondary | ICD-10-CM | POA: Diagnosis not present

## 2015-09-19 DIAGNOSIS — M47817 Spondylosis without myelopathy or radiculopathy, lumbosacral region: Secondary | ICD-10-CM | POA: Diagnosis not present

## 2015-09-19 DIAGNOSIS — R0902 Hypoxemia: Secondary | ICD-10-CM | POA: Diagnosis not present

## 2015-09-19 DIAGNOSIS — M899 Disorder of bone, unspecified: Secondary | ICD-10-CM | POA: Diagnosis not present

## 2015-09-25 DIAGNOSIS — R05 Cough: Secondary | ICD-10-CM | POA: Diagnosis not present

## 2015-09-25 DIAGNOSIS — R0602 Shortness of breath: Secondary | ICD-10-CM | POA: Diagnosis not present

## 2015-09-25 DIAGNOSIS — J449 Chronic obstructive pulmonary disease, unspecified: Secondary | ICD-10-CM | POA: Diagnosis not present

## 2015-09-25 DIAGNOSIS — J441 Chronic obstructive pulmonary disease with (acute) exacerbation: Secondary | ICD-10-CM | POA: Diagnosis not present

## 2015-10-01 DIAGNOSIS — Z23 Encounter for immunization: Secondary | ICD-10-CM | POA: Diagnosis not present

## 2015-10-01 DIAGNOSIS — J440 Chronic obstructive pulmonary disease with acute lower respiratory infection: Secondary | ICD-10-CM | POA: Diagnosis not present

## 2015-10-01 DIAGNOSIS — Z6841 Body Mass Index (BMI) 40.0 and over, adult: Secondary | ICD-10-CM | POA: Diagnosis not present

## 2015-10-14 DIAGNOSIS — J9612 Chronic respiratory failure with hypercapnia: Secondary | ICD-10-CM | POA: Diagnosis not present

## 2015-10-20 DIAGNOSIS — J449 Chronic obstructive pulmonary disease, unspecified: Secondary | ICD-10-CM | POA: Diagnosis not present

## 2015-10-20 DIAGNOSIS — R0902 Hypoxemia: Secondary | ICD-10-CM | POA: Diagnosis not present

## 2015-10-20 DIAGNOSIS — M47817 Spondylosis without myelopathy or radiculopathy, lumbosacral region: Secondary | ICD-10-CM | POA: Diagnosis not present

## 2015-10-20 DIAGNOSIS — M899 Disorder of bone, unspecified: Secondary | ICD-10-CM | POA: Diagnosis not present

## 2015-11-04 DIAGNOSIS — Z6841 Body Mass Index (BMI) 40.0 and over, adult: Secondary | ICD-10-CM | POA: Diagnosis not present

## 2015-11-04 DIAGNOSIS — I890 Lymphedema, not elsewhere classified: Secondary | ICD-10-CM | POA: Diagnosis not present

## 2015-11-04 DIAGNOSIS — N189 Chronic kidney disease, unspecified: Secondary | ICD-10-CM | POA: Diagnosis not present

## 2015-11-04 DIAGNOSIS — J449 Chronic obstructive pulmonary disease, unspecified: Secondary | ICD-10-CM | POA: Diagnosis not present

## 2015-11-04 DIAGNOSIS — N183 Chronic kidney disease, stage 3 (moderate): Secondary | ICD-10-CM | POA: Diagnosis not present

## 2015-11-04 DIAGNOSIS — I10 Essential (primary) hypertension: Secondary | ICD-10-CM | POA: Diagnosis not present

## 2015-11-04 DIAGNOSIS — F419 Anxiety disorder, unspecified: Secondary | ICD-10-CM | POA: Diagnosis not present

## 2015-11-04 DIAGNOSIS — E663 Overweight: Secondary | ICD-10-CM | POA: Diagnosis not present

## 2015-11-06 DIAGNOSIS — G4733 Obstructive sleep apnea (adult) (pediatric): Secondary | ICD-10-CM | POA: Diagnosis not present

## 2015-11-06 DIAGNOSIS — M810 Age-related osteoporosis without current pathological fracture: Secondary | ICD-10-CM | POA: Diagnosis not present

## 2015-11-06 DIAGNOSIS — R339 Retention of urine, unspecified: Secondary | ICD-10-CM | POA: Diagnosis not present

## 2015-11-06 DIAGNOSIS — Z0000 Encounter for general adult medical examination without abnormal findings: Secondary | ICD-10-CM | POA: Diagnosis not present

## 2015-11-06 DIAGNOSIS — I739 Peripheral vascular disease, unspecified: Secondary | ICD-10-CM | POA: Diagnosis not present

## 2015-11-06 DIAGNOSIS — Z6841 Body Mass Index (BMI) 40.0 and over, adult: Secondary | ICD-10-CM | POA: Diagnosis not present

## 2015-11-06 DIAGNOSIS — I447 Left bundle-branch block, unspecified: Secondary | ICD-10-CM | POA: Diagnosis not present

## 2015-11-06 DIAGNOSIS — G4700 Insomnia, unspecified: Secondary | ICD-10-CM | POA: Diagnosis not present

## 2015-11-06 DIAGNOSIS — R9431 Abnormal electrocardiogram [ECG] [EKG]: Secondary | ICD-10-CM | POA: Diagnosis not present

## 2015-11-13 DIAGNOSIS — J9612 Chronic respiratory failure with hypercapnia: Secondary | ICD-10-CM | POA: Diagnosis not present

## 2015-11-19 DIAGNOSIS — M47817 Spondylosis without myelopathy or radiculopathy, lumbosacral region: Secondary | ICD-10-CM | POA: Diagnosis not present

## 2015-11-19 DIAGNOSIS — R0902 Hypoxemia: Secondary | ICD-10-CM | POA: Diagnosis not present

## 2015-11-19 DIAGNOSIS — J449 Chronic obstructive pulmonary disease, unspecified: Secondary | ICD-10-CM | POA: Diagnosis not present

## 2015-11-19 DIAGNOSIS — M899 Disorder of bone, unspecified: Secondary | ICD-10-CM | POA: Diagnosis not present

## 2015-12-07 DIAGNOSIS — J449 Chronic obstructive pulmonary disease, unspecified: Secondary | ICD-10-CM | POA: Diagnosis not present

## 2015-12-07 DIAGNOSIS — F419 Anxiety disorder, unspecified: Secondary | ICD-10-CM | POA: Diagnosis not present

## 2015-12-07 DIAGNOSIS — Z6841 Body Mass Index (BMI) 40.0 and over, adult: Secondary | ICD-10-CM | POA: Diagnosis not present

## 2015-12-13 DIAGNOSIS — Z1212 Encounter for screening for malignant neoplasm of rectum: Secondary | ICD-10-CM | POA: Diagnosis not present

## 2015-12-14 DIAGNOSIS — J9612 Chronic respiratory failure with hypercapnia: Secondary | ICD-10-CM | POA: Diagnosis not present

## 2015-12-17 DIAGNOSIS — J189 Pneumonia, unspecified organism: Secondary | ICD-10-CM | POA: Diagnosis not present

## 2015-12-17 DIAGNOSIS — R0902 Hypoxemia: Secondary | ICD-10-CM | POA: Diagnosis not present

## 2015-12-17 DIAGNOSIS — J449 Chronic obstructive pulmonary disease, unspecified: Secondary | ICD-10-CM | POA: Diagnosis not present

## 2015-12-20 DIAGNOSIS — M899 Disorder of bone, unspecified: Secondary | ICD-10-CM | POA: Diagnosis not present

## 2015-12-20 DIAGNOSIS — R0902 Hypoxemia: Secondary | ICD-10-CM | POA: Diagnosis not present

## 2015-12-20 DIAGNOSIS — M47817 Spondylosis without myelopathy or radiculopathy, lumbosacral region: Secondary | ICD-10-CM | POA: Diagnosis not present

## 2015-12-20 DIAGNOSIS — J449 Chronic obstructive pulmonary disease, unspecified: Secondary | ICD-10-CM | POA: Diagnosis not present

## 2016-01-02 DIAGNOSIS — R05 Cough: Secondary | ICD-10-CM | POA: Diagnosis not present

## 2016-01-07 DIAGNOSIS — J440 Chronic obstructive pulmonary disease with acute lower respiratory infection: Secondary | ICD-10-CM | POA: Diagnosis not present

## 2016-01-07 DIAGNOSIS — Z6841 Body Mass Index (BMI) 40.0 and over, adult: Secondary | ICD-10-CM | POA: Diagnosis not present

## 2016-01-07 DIAGNOSIS — J9811 Atelectasis: Secondary | ICD-10-CM | POA: Diagnosis not present

## 2016-01-14 DIAGNOSIS — J9612 Chronic respiratory failure with hypercapnia: Secondary | ICD-10-CM | POA: Diagnosis not present

## 2016-01-20 DIAGNOSIS — M899 Disorder of bone, unspecified: Secondary | ICD-10-CM | POA: Diagnosis not present

## 2016-01-20 DIAGNOSIS — M47817 Spondylosis without myelopathy or radiculopathy, lumbosacral region: Secondary | ICD-10-CM | POA: Diagnosis not present

## 2016-01-20 DIAGNOSIS — J449 Chronic obstructive pulmonary disease, unspecified: Secondary | ICD-10-CM | POA: Diagnosis not present

## 2016-01-20 DIAGNOSIS — R0902 Hypoxemia: Secondary | ICD-10-CM | POA: Diagnosis not present

## 2016-02-01 DIAGNOSIS — R05 Cough: Secondary | ICD-10-CM | POA: Diagnosis not present

## 2016-02-01 DIAGNOSIS — R0602 Shortness of breath: Secondary | ICD-10-CM | POA: Diagnosis not present

## 2016-02-01 DIAGNOSIS — J449 Chronic obstructive pulmonary disease, unspecified: Secondary | ICD-10-CM | POA: Diagnosis not present

## 2016-02-03 DIAGNOSIS — I890 Lymphedema, not elsewhere classified: Secondary | ICD-10-CM | POA: Diagnosis not present

## 2016-02-03 DIAGNOSIS — N183 Chronic kidney disease, stage 3 (moderate): Secondary | ICD-10-CM | POA: Diagnosis not present

## 2016-02-03 DIAGNOSIS — I10 Essential (primary) hypertension: Secondary | ICD-10-CM | POA: Diagnosis not present

## 2016-02-03 DIAGNOSIS — J440 Chronic obstructive pulmonary disease with acute lower respiratory infection: Secondary | ICD-10-CM | POA: Diagnosis not present

## 2016-02-03 DIAGNOSIS — Z6841 Body Mass Index (BMI) 40.0 and over, adult: Secondary | ICD-10-CM | POA: Diagnosis not present

## 2016-02-03 DIAGNOSIS — F419 Anxiety disorder, unspecified: Secondary | ICD-10-CM | POA: Diagnosis not present

## 2016-02-03 DIAGNOSIS — N189 Chronic kidney disease, unspecified: Secondary | ICD-10-CM | POA: Diagnosis not present

## 2016-02-11 DIAGNOSIS — J9612 Chronic respiratory failure with hypercapnia: Secondary | ICD-10-CM | POA: Diagnosis not present

## 2016-02-17 DIAGNOSIS — M899 Disorder of bone, unspecified: Secondary | ICD-10-CM | POA: Diagnosis not present

## 2016-02-17 DIAGNOSIS — J449 Chronic obstructive pulmonary disease, unspecified: Secondary | ICD-10-CM | POA: Diagnosis not present

## 2016-02-17 DIAGNOSIS — R0902 Hypoxemia: Secondary | ICD-10-CM | POA: Diagnosis not present

## 2016-02-17 DIAGNOSIS — M47817 Spondylosis without myelopathy or radiculopathy, lumbosacral region: Secondary | ICD-10-CM | POA: Diagnosis not present

## 2016-03-13 DIAGNOSIS — J9612 Chronic respiratory failure with hypercapnia: Secondary | ICD-10-CM | POA: Diagnosis not present

## 2016-03-19 DIAGNOSIS — J449 Chronic obstructive pulmonary disease, unspecified: Secondary | ICD-10-CM | POA: Diagnosis not present

## 2016-03-19 DIAGNOSIS — M47817 Spondylosis without myelopathy or radiculopathy, lumbosacral region: Secondary | ICD-10-CM | POA: Diagnosis not present

## 2016-03-19 DIAGNOSIS — M899 Disorder of bone, unspecified: Secondary | ICD-10-CM | POA: Diagnosis not present

## 2016-03-19 DIAGNOSIS — R0902 Hypoxemia: Secondary | ICD-10-CM | POA: Diagnosis not present

## 2016-04-12 DIAGNOSIS — J9612 Chronic respiratory failure with hypercapnia: Secondary | ICD-10-CM | POA: Diagnosis not present

## 2016-04-18 DIAGNOSIS — J449 Chronic obstructive pulmonary disease, unspecified: Secondary | ICD-10-CM | POA: Diagnosis not present

## 2016-04-18 DIAGNOSIS — R0902 Hypoxemia: Secondary | ICD-10-CM | POA: Diagnosis not present

## 2016-04-18 DIAGNOSIS — M47817 Spondylosis without myelopathy or radiculopathy, lumbosacral region: Secondary | ICD-10-CM | POA: Diagnosis not present

## 2016-04-18 DIAGNOSIS — M899 Disorder of bone, unspecified: Secondary | ICD-10-CM | POA: Diagnosis not present

## 2016-05-03 DIAGNOSIS — Z6841 Body Mass Index (BMI) 40.0 and over, adult: Secondary | ICD-10-CM | POA: Diagnosis not present

## 2016-05-03 DIAGNOSIS — J440 Chronic obstructive pulmonary disease with acute lower respiratory infection: Secondary | ICD-10-CM | POA: Diagnosis not present

## 2016-05-19 DIAGNOSIS — M899 Disorder of bone, unspecified: Secondary | ICD-10-CM | POA: Diagnosis not present

## 2016-05-19 DIAGNOSIS — R0902 Hypoxemia: Secondary | ICD-10-CM | POA: Diagnosis not present

## 2016-05-19 DIAGNOSIS — M47817 Spondylosis without myelopathy or radiculopathy, lumbosacral region: Secondary | ICD-10-CM | POA: Diagnosis not present

## 2016-05-19 DIAGNOSIS — J449 Chronic obstructive pulmonary disease, unspecified: Secondary | ICD-10-CM | POA: Diagnosis not present

## 2016-06-18 DIAGNOSIS — R0902 Hypoxemia: Secondary | ICD-10-CM | POA: Diagnosis not present

## 2016-06-18 DIAGNOSIS — J449 Chronic obstructive pulmonary disease, unspecified: Secondary | ICD-10-CM | POA: Diagnosis not present

## 2016-06-18 DIAGNOSIS — M47817 Spondylosis without myelopathy or radiculopathy, lumbosacral region: Secondary | ICD-10-CM | POA: Diagnosis not present

## 2016-06-18 DIAGNOSIS — M899 Disorder of bone, unspecified: Secondary | ICD-10-CM | POA: Diagnosis not present

## 2016-07-19 DIAGNOSIS — M47817 Spondylosis without myelopathy or radiculopathy, lumbosacral region: Secondary | ICD-10-CM | POA: Diagnosis not present

## 2016-07-19 DIAGNOSIS — R0902 Hypoxemia: Secondary | ICD-10-CM | POA: Diagnosis not present

## 2016-07-19 DIAGNOSIS — M899 Disorder of bone, unspecified: Secondary | ICD-10-CM | POA: Diagnosis not present

## 2016-07-19 DIAGNOSIS — J449 Chronic obstructive pulmonary disease, unspecified: Secondary | ICD-10-CM | POA: Diagnosis not present

## 2016-07-27 DIAGNOSIS — J440 Chronic obstructive pulmonary disease with acute lower respiratory infection: Secondary | ICD-10-CM | POA: Diagnosis not present

## 2016-07-27 DIAGNOSIS — I890 Lymphedema, not elsewhere classified: Secondary | ICD-10-CM | POA: Diagnosis not present

## 2016-07-27 DIAGNOSIS — N183 Chronic kidney disease, stage 3 (moderate): Secondary | ICD-10-CM | POA: Diagnosis not present

## 2016-07-27 DIAGNOSIS — E559 Vitamin D deficiency, unspecified: Secondary | ICD-10-CM | POA: Diagnosis not present

## 2016-07-27 DIAGNOSIS — I10 Essential (primary) hypertension: Secondary | ICD-10-CM | POA: Diagnosis not present

## 2016-07-27 DIAGNOSIS — Z6841 Body Mass Index (BMI) 40.0 and over, adult: Secondary | ICD-10-CM | POA: Diagnosis not present

## 2016-07-27 DIAGNOSIS — N189 Chronic kidney disease, unspecified: Secondary | ICD-10-CM | POA: Diagnosis not present

## 2016-08-19 DIAGNOSIS — R0902 Hypoxemia: Secondary | ICD-10-CM | POA: Diagnosis not present

## 2016-08-19 DIAGNOSIS — J449 Chronic obstructive pulmonary disease, unspecified: Secondary | ICD-10-CM | POA: Diagnosis not present

## 2016-08-19 DIAGNOSIS — M47817 Spondylosis without myelopathy or radiculopathy, lumbosacral region: Secondary | ICD-10-CM | POA: Diagnosis not present

## 2016-08-19 DIAGNOSIS — M899 Disorder of bone, unspecified: Secondary | ICD-10-CM | POA: Diagnosis not present

## 2016-08-26 ENCOUNTER — Inpatient Hospital Stay (HOSPITAL_COMMUNITY)
Admission: EM | Admit: 2016-08-26 | Discharge: 2016-09-01 | DRG: 605 | Disposition: A | Discharge status: Skilled Nursing Facility | Payer: Commercial Managed Care - HMO | Attending: Internal Medicine | Admitting: Internal Medicine

## 2016-08-26 ENCOUNTER — Encounter (HOSPITAL_COMMUNITY): Payer: Self-pay

## 2016-08-26 DIAGNOSIS — Z9981 Dependence on supplemental oxygen: Secondary | ICD-10-CM | POA: Diagnosis not present

## 2016-08-26 DIAGNOSIS — I10 Essential (primary) hypertension: Secondary | ICD-10-CM | POA: Diagnosis not present

## 2016-08-26 DIAGNOSIS — Z23 Encounter for immunization: Secondary | ICD-10-CM

## 2016-08-26 DIAGNOSIS — I739 Peripheral vascular disease, unspecified: Secondary | ICD-10-CM | POA: Diagnosis not present

## 2016-08-26 DIAGNOSIS — M79606 Pain in leg, unspecified: Secondary | ICD-10-CM | POA: Diagnosis not present

## 2016-08-26 DIAGNOSIS — E1122 Type 2 diabetes mellitus with diabetic chronic kidney disease: Secondary | ICD-10-CM | POA: Diagnosis not present

## 2016-08-26 DIAGNOSIS — W19XXXA Unspecified fall, initial encounter: Secondary | ICD-10-CM | POA: Diagnosis not present

## 2016-08-26 DIAGNOSIS — G4733 Obstructive sleep apnea (adult) (pediatric): Secondary | ICD-10-CM | POA: Diagnosis present

## 2016-08-26 DIAGNOSIS — S81801A Unspecified open wound, right lower leg, initial encounter: Secondary | ICD-10-CM

## 2016-08-26 DIAGNOSIS — I517 Cardiomegaly: Secondary | ICD-10-CM | POA: Diagnosis not present

## 2016-08-26 DIAGNOSIS — S81811A Laceration without foreign body, right lower leg, initial encounter: Secondary | ICD-10-CM | POA: Diagnosis not present

## 2016-08-26 DIAGNOSIS — N179 Acute kidney failure, unspecified: Secondary | ICD-10-CM | POA: Diagnosis not present

## 2016-08-26 DIAGNOSIS — E538 Deficiency of other specified B group vitamins: Secondary | ICD-10-CM | POA: Diagnosis present

## 2016-08-26 DIAGNOSIS — E119 Type 2 diabetes mellitus without complications: Secondary | ICD-10-CM | POA: Diagnosis not present

## 2016-08-26 DIAGNOSIS — Y92009 Unspecified place in unspecified non-institutional (private) residence as the place of occurrence of the external cause: Secondary | ICD-10-CM | POA: Diagnosis not present

## 2016-08-26 DIAGNOSIS — S8991XA Unspecified injury of right lower leg, initial encounter: Secondary | ICD-10-CM | POA: Diagnosis not present

## 2016-08-26 DIAGNOSIS — S91001A Unspecified open wound, right ankle, initial encounter: Secondary | ICD-10-CM

## 2016-08-26 DIAGNOSIS — N189 Chronic kidney disease, unspecified: Secondary | ICD-10-CM | POA: Diagnosis present

## 2016-08-26 DIAGNOSIS — F329 Major depressive disorder, single episode, unspecified: Secondary | ICD-10-CM | POA: Diagnosis present

## 2016-08-26 DIAGNOSIS — D631 Anemia in chronic kidney disease: Secondary | ICD-10-CM | POA: Diagnosis present

## 2016-08-26 DIAGNOSIS — S81801D Unspecified open wound, right lower leg, subsequent encounter: Secondary | ICD-10-CM | POA: Diagnosis not present

## 2016-08-26 DIAGNOSIS — K219 Gastro-esophageal reflux disease without esophagitis: Secondary | ICD-10-CM | POA: Diagnosis present

## 2016-08-26 DIAGNOSIS — E785 Hyperlipidemia, unspecified: Secondary | ICD-10-CM | POA: Diagnosis present

## 2016-08-26 DIAGNOSIS — S91001S Unspecified open wound, right ankle, sequela: Secondary | ICD-10-CM | POA: Diagnosis not present

## 2016-08-26 DIAGNOSIS — T148 Other injury of unspecified body region: Secondary | ICD-10-CM | POA: Diagnosis present

## 2016-08-26 DIAGNOSIS — T148XXA Other injury of unspecified body region, initial encounter: Secondary | ICD-10-CM

## 2016-08-26 DIAGNOSIS — E559 Vitamin D deficiency, unspecified: Secondary | ICD-10-CM | POA: Diagnosis present

## 2016-08-26 DIAGNOSIS — D649 Anemia, unspecified: Secondary | ICD-10-CM | POA: Diagnosis not present

## 2016-08-26 DIAGNOSIS — Z7951 Long term (current) use of inhaled steroids: Secondary | ICD-10-CM

## 2016-08-26 DIAGNOSIS — M810 Age-related osteoporosis without current pathological fracture: Secondary | ICD-10-CM | POA: Diagnosis present

## 2016-08-26 DIAGNOSIS — S81801S Unspecified open wound, right lower leg, sequela: Secondary | ICD-10-CM | POA: Diagnosis not present

## 2016-08-26 DIAGNOSIS — L03115 Cellulitis of right lower limb: Secondary | ICD-10-CM | POA: Diagnosis present

## 2016-08-26 DIAGNOSIS — Z87891 Personal history of nicotine dependence: Secondary | ICD-10-CM

## 2016-08-26 DIAGNOSIS — W109XXA Fall (on) (from) unspecified stairs and steps, initial encounter: Secondary | ICD-10-CM | POA: Diagnosis present

## 2016-08-26 DIAGNOSIS — Z825 Family history of asthma and other chronic lower respiratory diseases: Secondary | ICD-10-CM

## 2016-08-26 DIAGNOSIS — I129 Hypertensive chronic kidney disease with stage 1 through stage 4 chronic kidney disease, or unspecified chronic kidney disease: Secondary | ICD-10-CM | POA: Diagnosis present

## 2016-08-26 DIAGNOSIS — J9610 Chronic respiratory failure, unspecified whether with hypoxia or hypercapnia: Secondary | ICD-10-CM | POA: Diagnosis not present

## 2016-08-26 DIAGNOSIS — I872 Venous insufficiency (chronic) (peripheral): Secondary | ICD-10-CM | POA: Diagnosis present

## 2016-08-26 DIAGNOSIS — R918 Other nonspecific abnormal finding of lung field: Secondary | ICD-10-CM | POA: Diagnosis not present

## 2016-08-26 DIAGNOSIS — R7303 Prediabetes: Secondary | ICD-10-CM | POA: Diagnosis not present

## 2016-08-26 DIAGNOSIS — F5104 Psychophysiologic insomnia: Secondary | ICD-10-CM | POA: Diagnosis present

## 2016-08-26 DIAGNOSIS — R58 Hemorrhage, not elsewhere classified: Secondary | ICD-10-CM | POA: Diagnosis not present

## 2016-08-26 DIAGNOSIS — J188 Other pneumonia, unspecified organism: Secondary | ICD-10-CM | POA: Diagnosis not present

## 2016-08-26 DIAGNOSIS — E876 Hypokalemia: Secondary | ICD-10-CM | POA: Diagnosis present

## 2016-08-26 DIAGNOSIS — J449 Chronic obstructive pulmonary disease, unspecified: Secondary | ICD-10-CM

## 2016-08-26 DIAGNOSIS — Z79899 Other long term (current) drug therapy: Secondary | ICD-10-CM

## 2016-08-26 DIAGNOSIS — J418 Mixed simple and mucopurulent chronic bronchitis: Secondary | ICD-10-CM | POA: Diagnosis not present

## 2016-08-26 DIAGNOSIS — M79609 Pain in unspecified limb: Secondary | ICD-10-CM | POA: Diagnosis not present

## 2016-08-26 DIAGNOSIS — Z6841 Body Mass Index (BMI) 40.0 and over, adult: Secondary | ICD-10-CM | POA: Diagnosis not present

## 2016-08-26 DIAGNOSIS — S81001A Unspecified open wound, right knee, initial encounter: Secondary | ICD-10-CM

## 2016-08-26 MED ORDER — HYDROMORPHONE HCL 1 MG/ML IJ SOLN
1.0000 mg | Freq: Once | INTRAMUSCULAR | Status: AC
  Administered 2016-08-27: 1 mg via INTRAVENOUS
  Filled 2016-08-26: qty 1

## 2016-08-26 MED ORDER — TETANUS-DIPHTH-ACELL PERTUSSIS 5-2.5-18.5 LF-MCG/0.5 IM SUSP
0.5000 mL | Freq: Once | INTRAMUSCULAR | Status: AC
  Administered 2016-08-27: 0.5 mL via INTRAMUSCULAR
  Filled 2016-08-26: qty 0.5

## 2016-08-26 NOTE — ED Provider Notes (Addendum)
[facility] DEPT Provider Note   CSN: [id number] Arrival date & time: 08/26/16  2309  By signing my name below, I, Amanda Horton, attest that this documentation has been prepared under the direction and in the presence of [name], MD. Electronically Signed: Irene Horton, ED Scribe. 08/26/16. 11:59 PM.  History   Chief Complaint Chief Complaint  Patient presents with  . Fall   The history is provided by the patient. No language interpreter was used.    HPI Comments: Amanda Horton is a 70 y.o. female with a hx of renal failure, COPD, and osteoporosis brought in by PTAR who presents to the Emergency Department complaining of a fall onset PTA. Pt states that she was trying to walk up a step when she fell and scraped her right leg along the cement stair. Pt currently rates her pain 7/10. Pt says that she was unable to get up after the fall. She had to be assisted by PTAR. Pt's leg is currently wrapped at bedside. Pt denies use of blood thinners. Pt denies hitting head, chest pain, dizziness, lightheadedness, weakness, or LOC. Pt is unsure when her last tdap was. She denies allergies to pain medication.  Past Medical History:  Diagnosis Date  . Acute hyperkalemia   . Acute hypokalemia   . Acute on chronic renal failure ([place])   . Allergic rhinitis   . Anemia due to chronic kidney disease   . APC (atrial premature contractions)   . Arthritis of lumbar spine   . Benign essential hypertension   . Borderline diabetes   . Chronic insomnia   . Chronic venous insufficiency   . COPD (chronic obstructive pulmonary disease) ([place])   . GERD (gastroesophageal reflux disease)   . Hyperlipemia   . Hypoxia   . Lumbar disc narrowing   . OSA (obstructive sleep apnea)   . Osteoporosis   . Respiratory failure requiring intubation ([place])   . Spinal stenosis of lumbar region without neurogenic claudication   . Varicose veins with pain   . Vitamin B12 deficiency   . Vitamin D deficiency      Patient Active Problem List   Diagnosis Date Noted  . Vitamin B12 deficiency   . Borderline diabetes   . Hyperlipemia   . GERD (gastroesophageal reflux disease)   . COPD (chronic obstructive pulmonary disease) ([place])   . OSA (obstructive sleep apnea)   . Varicose veins with pain   . Chronic venous insufficiency     Past Surgical History:  Procedure Laterality Date  . PARTIAL HYSTERECTOMY      OB History    No data available       Home Medications    Prior to Admission medications   Medication Sig Start Date End Date Taking? Authorizing Provider  ACCU-CHEK FASTCLIX LANCETS [place]  02/16/15   Historical Provider, MD  ACCU-CHEK SMARTVIEW test strip  02/16/15   Historical Provider, MD  Acetaminophen 500 MG coapsule Take 3 capsules by mouth as needed. 12/20/14   Historical Provider, MD  albuterol (PROVENTIL HFA;VENTOLIN HFA) 108 (90 BASE) MCG/ACT inhaler Inhale 2 puffs into the lungs every 6 (six) hours as needed for wheezing or shortness of breath.    Historical Provider, MD  albuterol (PROVENTIL) (2.5 MG/3ML) 0.083% nebulizer solution Take 3 mLs (2.5 mg total) by nebulization every 6 (six) hours as needed for wheezing or shortness of breath. 05/04/15   Elsie Stain, MD  Calcium Carbonate-Vitamin D 600-400 MG-UNIT per tablet Take 1 tablet by mouth 2 (  two) times daily.    Historical Provider, MD  Cholecalciferol (VITAMIN D PO) Take 5,000 Units by mouth daily.    Historical Provider, MD  cyanocobalamin 500 MCG tablet Take 500 mcg by mouth daily.    Historical Provider, MD  dextromethorphan-guaiFENesin (MUCINEX DM) 30-600 MG per 12 hr tablet Take 1 tablet by mouth 2 (two) times daily.    Historical Provider, MD  furosemide (LASIX) 80 MG tablet Take 1 tablet by mouth daily. 11/06/14   Historical Provider, MD  LORazepam (ATIVAN) 0.5 MG tablet  04/22/15   Historical Provider, MD  metoprolol tartrate (LOPRESSOR) 25 MG tablet Take 1 tablet by mouth 2 (two) times daily. 11/06/14   Historical  Provider, MD  omeprazole (PRILOSEC) 40 MG capsule Take 1 capsule by mouth daily. 11/06/14   Historical Provider, MD  potassium chloride SA (K-DUR,KLOR-CON) 20 MEQ tablet Take 1 tablet by mouth 2 (two) times daily. 11/06/14   Historical Provider, MD  ranitidine (ZANTAC) 150 MG tablet Take 1 tablet by mouth 2 (two) times daily. 11/06/14   Historical Provider, MD  SYMBICORT 160-4.5 MCG/ACT inhaler Inhale 2 puffs into the lungs 2 (two) times daily. 12/05/14   Historical Provider, MD  temazepam (RESTORIL) 15 MG capsule Take 1 capsule by mouth at bedtime as needed. 11/09/14   Historical Provider, MD  Tiotropium Bromide Monohydrate 2.5 MCG/ACT AERS Inhale 2 puffs into the lungs daily.    Historical Provider, MD  Vitamin D, Ergocalciferol, (DRISDOL) 50000 UNITS CAPS capsule Take 1 capsule by mouth every 7 (seven) days. 11/06/14   Historical Provider, MD    Family History Family History  Problem Relation Age of Onset  . Emphysema Father   . Asthma Father   . Throat cancer Mother     Social History Social History  Substance Use Topics  . Smoking status: Former Smoker    Packs/day: 1.00    Years: 55.00    Types: Cigarettes    Quit date: 12/04/2012  . Smokeless tobacco: Never Used     Comment: Stopped in 2014  . Alcohol use No     Allergies   Review of patient's allergies indicates no known allergies.   Review of Systems Review of Systems  Musculoskeletal: Positive for arthralgias.  Skin: Positive for wound.  Neurological: Negative for syncope and headaches.  All other systems reviewed and are negative.   Physical Exam Updated Vital Signs BP 151/65 (BP Location: Right Arm)   Pulse 87   Temp 98.1 F (36.7 C) (Oral)   Resp 20   Ht 5\' 2"  (1.575 m)   Wt 250 lb (113.4 kg)   SpO2 98%   BMI 45.73 kg/m   Physical Exam  Constitutional: She is oriented to person, place, and time. She appears well-developed and well-nourished.  HENT:  Head: Normocephalic and atraumatic.  Right Ear:  External ear normal.  Left Ear: External ear normal.  Nose: Nose normal.  Eyes: Right eye exhibits no discharge. Left eye exhibits no discharge.  Cardiovascular: Normal rate, regular rhythm and normal heart sounds.   Pulses:      Dorsalis pedis pulses are 2+ on the right side, and 2+ on the left side.  Pulmonary/Chest: Effort normal and breath sounds normal.  Abdominal: Soft. There is no tenderness.  Musculoskeletal:  Normal movement and sensation in right foot/ankle. Equal warmth compared to left foot. Large degloving injury to lateral lower leg, see picture  Neurological: She is alert and oriented to person, place, and time.  CN 3-12 grossly  intact. 5/5 strength in all 4 extremities. Grossly normal sensation. Normal finger to nose.   Skin: Skin is warm and dry.  See pictures below  Nursing note and vitals reviewed.        ED Treatments / Results  DIAGNOSTIC STUDIES: Oxygen Saturation is 98% on 2L O2, normal by my interpretation.    COORDINATION OF CARE: 11:54 PM-Discussed treatment plan which includes pain medication with pt at bedside and pt agreed to plan.    Labs (all labs ordered are listed, but only abnormal results are displayed) Labs Reviewed  BASIC METABOLIC PANEL - Abnormal; Notable for the following:       Result Value   Chloride 95 (*)    CO2 39 (*)    Glucose, Bld 177 (*)    Creatinine, Ser 1.20 (*)    GFR calc non Af Amer 45 (*)    GFR calc Af Amer 52 (*)    All other components within normal limits  CBC WITH DIFFERENTIAL/PLATELET - Abnormal; Notable for the following:    Hemoglobin 11.1 (*)    MCHC 29.3 (*)    All other components within normal limits  PROTIME-INR  TYPE AND SCREEN  ABO/RH    EKG  EKG Interpretation None       Radiology Dg Tibia/fibula Right Port  Result Date: 08/27/2016 CLINICAL DATA:  [place] down steps. Large avulsion of the tib-fib area. EXAM: PORTABLE RIGHT TIBIA AND FIBULA - 2 VIEW COMPARISON:  None. FINDINGS: Large  superficial soft tissue irregularity along the lateral aspect of the right lower leg consistent with history of avulsion. No radiopaque soft tissue foreign bodies are demonstrated. The right tibia and fibula appear intact. No evidence of acute fracture or dislocation. IMPRESSION: Soft tissue avulsion to the lateral aspect of the right lower leg. No radiopaque soft tissue foreign bodies. No acute bony abnormalities. Electronically Signed   By: Lucienne Capers M.D.   On: 08/27/2016 01:12    Procedures .Marland KitchenLaceration Repair Date/Time: 08/27/2016 2:49 AM Performed by: [name] Authorized by: [name]   Consent:    Consent obtained:  Verbal   Consent given by:  Patient Anesthesia (see MAR for exact dosages):    Anesthesia method:  Local infiltration   Local anesthetic:  Lidocaine 2% WITH epi Laceration details:    Location:  Leg   Leg location:  R lower leg Repair type:    Repair type:  Complex Pre-procedure details:    Preparation:  Imaging obtained to evaluate for foreign bodies and patient was prepped and draped in usual sterile fashion Exploration:    Contaminated: no   Treatment:    Amount of cleaning:  Extensive   Irrigation solution:  Sterile saline   Irrigation volume:  2 liters Skin repair:    Repair method:  Sutures   Suture size:  4-0   Suture technique:  Simple interrupted   Number of sutures:  16 Approximation:    Approximation:  Loose Post-procedure details:    Dressing:  Bulky dressing   Patient tolerance of procedure:  Tolerated well, no immediate complications    (including critical care time)  Medications Ordered in ED Medications  LORazepam (ATIVAN) tablet 1 mg (not administered)  ipratropium-albuterol (DUONEB) 0.5-2.5 (3) MG/3ML nebulizer solution 3 mL (not administered)  mometasone-formoterol (DULERA) 200-5 MCG/ACT inhaler 2 puff (not administered)  escitalopram (LEXAPRO) tablet 10 mg (not administered)  guaiFENesin (MUCINEX) 12 hr tablet  600 mg (not administered)  furosemide (LASIX) tablet 80 mg (not administered)  temazepam (RESTORIL) capsule 15 mg (not administered)  potassium chloride SA (K-DUR,KLOR-CON) CR tablet 20 mEq (not administered)  pantoprazole (PROTONIX) EC tablet 40 mg (not administered)  HYDROmorphone (DILAUDID) injection 1 mg (1 mg Intravenous Given 08/27/16 0010)  Tdap (BOOSTRIX) injection 0.5 mL (0.5 mLs Intramuscular Given 08/27/16 0009)  ceFAZolin (ANCEF) IVPB 2g/100 mL premix (0 g Intravenous Stopped 08/27/16 0318)  lidocaine-EPINEPHrine (XYLOCAINE W/EPI) 2 %-1:200000 (PF) injection 20 mL (20 mLs Intradermal Given 08/27/16 0220)   Initial Impression / Assessment and Plan / ED Course  I have reviewed the triage vital signs and the nursing notes.  Pertinent labs & imaging results that were available during my care of the patient were reviewed by me and considered in my medical decision making (see chart for details).  Clinical Course    Patient presents after what was probably a mechanical fall. She was never dizzy or lightheaded. Never lost consciousness. Has significant degloving injury of her right lower leg as above. After discussion with trauma surgery, Dr. Barry Dienes, she recommends loosely approximating parts that can be approximated. However the chance of actual wound healing is low and probably the skin flap will die. She will need plastic surgery follow-up and wound care. Given tetanus immunization and Ancef. IV Dilaudid for pain. She is neurovascularly intact. I have approximated the wound as best as possible and then wrapped it to help keep it sort of stable. There appears to be no fracture or foreign bodies. There are multiple flaps of skin unable to be re-approximated. It was irrigated extensively by myself. She is unable to ambulate currently due to pain and lives by herself. Due to this, d/w Dr Eulas Post, will admit to med surg obs.   Final Clinical Impressions(s) / ED Diagnoses   Final diagnoses:  Skin  avulsion  Fall, initial encounter  Degloving injury of lower leg, right, initial encounter   I personally performed the services described in this documentation, which was scribed in my presence. The recorded information has been reviewed and is accurate.   New Prescriptions New Prescriptions   No medications on file     [name], MD 08/27/16 [id number]    [name], MD 08/27/16 0830

## 2016-08-26 NOTE — ED Triage Notes (Signed)
PTAR reports that the patient was found sitting at the bottom of a 1 step porch at the time of their arrival. Patient reports falling while attempting to step up onto the porch. She reports her leg scraped along the cement stair causing significant abrasion.  She denies hitting her head of losing consciousness. VS with EMS were BP: 120/palp, 81hr, 97% on 2L.

## 2016-08-27 ENCOUNTER — Observation Stay (HOSPITAL_COMMUNITY): Payer: Commercial Managed Care - HMO

## 2016-08-27 ENCOUNTER — Emergency Department (HOSPITAL_COMMUNITY): Payer: Commercial Managed Care - HMO

## 2016-08-27 DIAGNOSIS — I10 Essential (primary) hypertension: Secondary | ICD-10-CM

## 2016-08-27 DIAGNOSIS — G4733 Obstructive sleep apnea (adult) (pediatric): Secondary | ICD-10-CM | POA: Diagnosis not present

## 2016-08-27 DIAGNOSIS — N179 Acute kidney failure, unspecified: Secondary | ICD-10-CM | POA: Diagnosis not present

## 2016-08-27 DIAGNOSIS — J449 Chronic obstructive pulmonary disease, unspecified: Secondary | ICD-10-CM | POA: Diagnosis not present

## 2016-08-27 DIAGNOSIS — R918 Other nonspecific abnormal finding of lung field: Secondary | ICD-10-CM | POA: Diagnosis not present

## 2016-08-27 DIAGNOSIS — W19XXXA Unspecified fall, initial encounter: Secondary | ICD-10-CM

## 2016-08-27 LAB — CBC WITH DIFFERENTIAL/PLATELET
BASOS ABS: 0 10*3/uL (ref 0.0–0.1)
BASOS PCT: 0 %
Eosinophils Absolute: 0.1 10*3/uL (ref 0.0–0.7)
Eosinophils Relative: 1 %
HEMATOCRIT: 37.9 % (ref 36.0–46.0)
HEMOGLOBIN: 11.1 g/dL — AB (ref 12.0–15.0)
Lymphocytes Relative: 19 %
Lymphs Abs: 1.4 10*3/uL (ref 0.7–4.0)
MCH: 27.9 pg (ref 26.0–34.0)
MCHC: 29.3 g/dL — ABNORMAL LOW (ref 30.0–36.0)
MCV: 95.2 fL (ref 78.0–100.0)
MONOS PCT: 13 %
Monocytes Absolute: 1 10*3/uL (ref 0.1–1.0)
NEUTROS ABS: 5.2 10*3/uL (ref 1.7–7.7)
NEUTROS PCT: 67 %
Platelets: 188 10*3/uL (ref 150–400)
RBC: 3.98 MIL/uL (ref 3.87–5.11)
RDW: 13.3 % (ref 11.5–15.5)
WBC: 7.6 10*3/uL (ref 4.0–10.5)

## 2016-08-27 LAB — GLUCOSE, CAPILLARY
GLUCOSE-CAPILLARY: 122 mg/dL — AB (ref 65–99)
Glucose-Capillary: 129 mg/dL — ABNORMAL HIGH (ref 65–99)
Glucose-Capillary: 122 mg/dL — ABNORMAL HIGH (ref 65–99)

## 2016-08-27 LAB — TYPE AND SCREEN
ABO/RH(D): A NEG
ANTIBODY SCREEN: NEGATIVE

## 2016-08-27 LAB — ABO/RH: ABO/RH(D): A NEG

## 2016-08-27 LAB — BASIC METABOLIC PANEL
ANION GAP: 6 (ref 5–15)
BUN: 20 mg/dL (ref 6–20)
CALCIUM: 8.9 mg/dL (ref 8.9–10.3)
CO2: 39 mmol/L — AB (ref 22–32)
Chloride: 95 mmol/L — ABNORMAL LOW (ref 101–111)
Creatinine, Ser: 1.2 mg/dL — ABNORMAL HIGH (ref 0.44–1.00)
GFR, EST AFRICAN AMERICAN: 52 mL/min — AB (ref >60)
GFR, EST NON AFRICAN AMERICAN: 45 mL/min — AB (ref >60)
Glucose, Bld: 177 mg/dL — ABNORMAL HIGH (ref 65–99)
Potassium: 4 mmol/L (ref 3.5–5.1)
SODIUM: 140 mmol/L (ref 135–145)

## 2016-08-27 LAB — PROTIME-INR
INR: 1
PROTHROMBIN TIME: 13.2 s (ref 11.4–15.2)

## 2016-08-27 MED ORDER — IPRATROPIUM-ALBUTEROL 0.5-2.5 (3) MG/3ML IN SOLN
3.0000 mL | RESPIRATORY_TRACT | Status: DC | PRN
  Administered 2016-08-30 – 2016-08-31 (×4): 3 mL via RESPIRATORY_TRACT
  Filled 2016-08-27 (×3): qty 3

## 2016-08-27 MED ORDER — POTASSIUM CHLORIDE CRYS ER 20 MEQ PO TBCR
20.0000 meq | EXTENDED_RELEASE_TABLET | Freq: Two times a day (BID) | ORAL | Status: DC
  Administered 2016-08-27 – 2016-08-29 (×6): 20 meq via ORAL
  Filled 2016-08-27 (×6): qty 1

## 2016-08-27 MED ORDER — GUAIFENESIN ER 600 MG PO TB12
600.0000 mg | ORAL_TABLET | Freq: Two times a day (BID) | ORAL | Status: DC
  Administered 2016-08-27 – 2016-09-01 (×10): 600 mg via ORAL
  Filled 2016-08-27 (×10): qty 1

## 2016-08-27 MED ORDER — FUROSEMIDE 40 MG PO TABS
80.0000 mg | ORAL_TABLET | Freq: Once | ORAL | Status: DC
  Filled 2016-08-27 (×2): qty 2

## 2016-08-27 MED ORDER — ONDANSETRON HCL 4 MG PO TABS
4.0000 mg | ORAL_TABLET | Freq: Four times a day (QID) | ORAL | Status: DC | PRN
  Administered 2016-08-27: 4 mg via ORAL
  Filled 2016-08-27: qty 1

## 2016-08-27 MED ORDER — FUROSEMIDE 40 MG PO TABS
80.0000 mg | ORAL_TABLET | Freq: Every day | ORAL | Status: DC
  Administered 2016-08-27: 80 mg via ORAL
  Filled 2016-08-27: qty 2

## 2016-08-27 MED ORDER — BISACODYL 5 MG PO TBEC
5.0000 mg | DELAYED_RELEASE_TABLET | Freq: Every day | ORAL | Status: DC | PRN
  Administered 2016-08-31: 5 mg via ORAL
  Filled 2016-08-27: qty 1

## 2016-08-27 MED ORDER — OXYCODONE-ACETAMINOPHEN 5-325 MG PO TABS
1.0000 | ORAL_TABLET | Freq: Four times a day (QID) | ORAL | Status: DC | PRN
  Administered 2016-08-28 – 2016-09-01 (×6): 1 via ORAL
  Filled 2016-08-27 (×6): qty 1

## 2016-08-27 MED ORDER — ONDANSETRON HCL 4 MG/2ML IJ SOLN: 4.0000 mg | Freq: Four times a day (QID) | INTRAMUSCULAR | Status: DC | PRN

## 2016-08-27 MED ORDER — LORAZEPAM 1 MG PO TABS
1.0000 mg | ORAL_TABLET | Freq: Three times a day (TID) | ORAL | Status: DC | PRN
Start: 2016-08-27 — End: 2016-09-01
  Administered 2016-08-31: 1 mg via ORAL
  Filled 2016-08-27: qty 1

## 2016-08-27 MED ORDER — ESCITALOPRAM OXALATE 10 MG PO TABS
10.0000 mg | ORAL_TABLET | Freq: Every day | ORAL | Status: DC
  Administered 2016-08-27 – 2016-09-01 (×5): 10 mg via ORAL
  Filled 2016-08-27 (×5): qty 1

## 2016-08-27 MED ORDER — IPRATROPIUM-ALBUTEROL 0.5-2.5 (3) MG/3ML IN SOLN
3.0000 mL | RESPIRATORY_TRACT | Status: DC
  Administered 2016-08-27: 3 mL via RESPIRATORY_TRACT
  Filled 2016-08-27: qty 3

## 2016-08-27 MED ORDER — MOMETASONE FURO-FORMOTEROL FUM 200-5 MCG/ACT IN AERO
2.0000 | INHALATION_SPRAY | Freq: Two times a day (BID) | RESPIRATORY_TRACT | Status: DC
  Administered 2016-08-27 – 2016-09-01 (×9): 2 via RESPIRATORY_TRACT
  Filled 2016-08-27 (×3): qty 8.8

## 2016-08-27 MED ORDER — CEFAZOLIN SODIUM-DEXTROSE 2-4 GM/100ML-% IV SOLN
2.0000 g | Freq: Once | INTRAVENOUS | Status: AC
  Administered 2016-08-27: 2 g via INTRAVENOUS
  Filled 2016-08-27: qty 100

## 2016-08-27 MED ORDER — LIDOCAINE-EPINEPHRINE (PF) 2 %-1:200000 IJ SOLN
20.0000 mL | Freq: Once | INTRAMUSCULAR | Status: AC
  Administered 2016-08-27: 20 mL via INTRADERMAL
  Filled 2016-08-27: qty 20

## 2016-08-27 MED ORDER — INSULIN ASPART 100 UNIT/ML ~~LOC~~ SOLN
0.0000 [IU] | Freq: Three times a day (TID) | SUBCUTANEOUS | Status: DC
  Administered 2016-08-27 – 2016-09-01 (×9): 1 [IU] via SUBCUTANEOUS

## 2016-08-27 MED ORDER — TEMAZEPAM 15 MG PO CAPS: 15.0000 mg | ORAL_CAPSULE | Freq: Every evening | ORAL | Status: DC | PRN

## 2016-08-27 MED ORDER — POLYETHYLENE GLYCOL 3350 17 G PO PACK: 17.0000 g | PACK | Freq: Every day | ORAL | Status: DC | PRN

## 2016-08-27 MED ORDER — ACETAMINOPHEN 325 MG PO TABS
650.0000 mg | ORAL_TABLET | Freq: Four times a day (QID) | ORAL | Status: DC | PRN
  Administered 2016-08-29 – 2016-08-30 (×2): 650 mg via ORAL
  Filled 2016-08-27 (×3): qty 2

## 2016-08-27 MED ORDER — ACETAMINOPHEN 650 MG RE SUPP: 650.0000 mg | Freq: Four times a day (QID) | RECTAL | Status: DC | PRN

## 2016-08-27 MED ORDER — SODIUM CHLORIDE 0.9 % IV SOLN
INTRAVENOUS | Status: DC
  Administered 2016-08-27: 11:00:00 via INTRAVENOUS

## 2016-08-27 MED ORDER — PANTOPRAZOLE SODIUM 40 MG PO TBEC
40.0000 mg | DELAYED_RELEASE_TABLET | Freq: Every day | ORAL | Status: DC
  Administered 2016-08-27 – 2016-09-01 (×5): 40 mg via ORAL
  Filled 2016-08-27 (×5): qty 1

## 2016-08-27 MED ORDER — TRAZODONE HCL 50 MG PO TABS: 25.0000 mg | ORAL_TABLET | Freq: Every evening | ORAL | Status: DC | PRN

## 2016-08-27 NOTE — ED Notes (Signed)
Right leg dressed with gauze and gauze wrap.

## 2016-08-27 NOTE — H&P (Signed)
History and Physical    Amanda Horton [id number] DOB: 03/04/1946 DOA: 08/26/2016  PCP: Nicoletta Dress, MD  Patient coming from:   Home   Chief Complaint:  fall with leg injury HPI: Amanda Horton is a 70 y.o. female with a medical history significant for COPD on home 02. Patient presented to ED last night after a fall at home. She fell while trying to walk up a step and sustained significant injury to right lower leg. Fall was mechanical without any preceding dizziness or weakness.   ED Course:  Afebrile, VSS WBC 7.6, WBC 11.1. Cr 1.2, Glu 177 Ancef x 1.   Review of Systems: As per HPI, otherwise 10 point review of systems negative.   Past Medical History:  Diagnosis Date  . Acute hyperkalemia   . Acute hypokalemia   . Acute on chronic renal failure (Amanda Horton)   . Allergic rhinitis   . Anemia due to chronic kidney disease   . APC (atrial premature contractions)   . Arthritis of lumbar spine   . Benign essential hypertension   . Borderline diabetes   . Chronic insomnia   . Chronic venous insufficiency   . COPD (chronic obstructive pulmonary disease) (Amanda Horton)   . GERD (gastroesophageal reflux disease)   . Hyperlipemia   . Hypoxia   . Lumbar disc narrowing   . OSA (obstructive sleep apnea)   . Osteoporosis   . Respiratory failure requiring intubation (Amanda Horton)   . Spinal stenosis of lumbar region without neurogenic claudication   . Varicose veins with pain   . Vitamin B12 deficiency   . Vitamin D deficiency     Past Surgical History:  Procedure Laterality Date  . PARTIAL HYSTERECTOMY      Social History   Social History  . Marital status: Single    Spouse name: N/A  . Number of children: N/A  . Years of education: N/A   Occupational History  . Retired     [employer]   Social History Main Topics  . Smoking status: Former Smoker    Packs/day: 1.00    Years: 55.00    Types: Cigarettes    Quit date: 12/04/2012  . Smokeless tobacco: Never Used     Comment:  Stopped in 2014  . Alcohol use No  . Drug use: No  . Sexual activity: Not on file   Other Topics Concern  . Not on file   Social History Narrative  . No narrative on file   Lives in house with her son. No assistive devices needed for ambulation.  No Known Allergies  Family History  Problem Relation Age of Onset  . Emphysema Father   . Asthma Father   . Throat cancer Mother     Prior to Admission medications   Medication Sig Start Date End Date Taking? Authorizing Provider  albuterol (PROVENTIL HFA;VENTOLIN HFA) 108 (90 BASE) MCG/ACT inhaler Inhale 2 puffs into the lungs every 6 (six) hours as needed for wheezing or shortness of breath.   Yes Historical Provider, MD  alendronate (FOSAMAX) 70 MG tablet Take 70 mg by mouth once a week. Take with a full glass of water on an empty stomach.   Yes Historical Provider, MD  Calcium Carbonate-Vitamin D 600-400 MG-UNIT per tablet Take 1 tablet by mouth 2 (two) times daily.   Yes Historical Provider, MD  Cholecalciferol (VITAMIN D PO) Take 5,000 Units by mouth daily.   Yes Historical Provider, MD  cyanocobalamin 500 MCG tablet Take 500 mcg by  mouth daily.   Yes Historical Provider, MD  escitalopram (LEXAPRO) 10 MG tablet Take 10 mg by mouth daily.   Yes Historical Provider, MD  furosemide (LASIX) 80 MG tablet Take 1 tablet by mouth daily. 11/06/14  Yes Historical Provider, MD  guaiFENesin (MUCINEX) 600 MG 12 hr tablet Take 600 mg by mouth 2 (two) times daily.   Yes Historical Provider, MD  ipratropium-albuterol (DUONEB) 0.5-2.5 (3) MG/3ML SOLN Take 3 mLs by nebulization every 4 (four) hours as needed (for breathing).   Yes Historical Provider, MD  linaclotide (LINZESS) 145 MCG CAPS capsule Take 145 mcg by mouth daily before breakfast.   Yes Historical Provider, MD  LORazepam (ATIVAN) 1 MG tablet Take 1 mg by mouth every 8 (eight) hours as needed for anxiety.   Yes Historical Provider, MD  metoprolol tartrate (LOPRESSOR) 25 MG tablet Take 1  tablet by mouth 2 (two) times daily. 11/06/14  Yes Historical Provider, MD  mometasone-formoterol (DULERA) 200-5 MCG/ACT AERO Inhale 2 puffs into the lungs 2 (two) times daily.   Yes Historical Provider, MD  omeprazole (PRILOSEC) 40 MG capsule Take 1 capsule by mouth daily. 11/06/14  Yes Historical Provider, MD  potassium chloride SA (K-DUR,KLOR-CON) 20 MEQ tablet Take 1 tablet by mouth 2 (two) times daily. 11/06/14  Yes Historical Provider, MD  ranitidine (ZANTAC) 150 MG tablet Take 1 tablet by mouth 2 (two) times daily. 11/06/14  Yes Historical Provider, MD  temazepam (RESTORIL) 15 MG capsule Take 1 capsule by mouth at bedtime as needed for sleep.  11/09/14  Yes Historical Provider, MD  Tiotropium Bromide Monohydrate 2.5 MCG/ACT AERS Inhale 2 puffs into the lungs daily.   Yes Historical Provider, MD  Vitamin D, Ergocalciferol, (DRISDOL) 50000 UNITS CAPS capsule Take 1 capsule by mouth every 7 (seven) days. 11/06/14  Yes Historical Provider, MD  ACCU-CHEK FASTCLIX LANCETS [place]  02/16/15   Historical Provider, MD  ACCU-CHEK SMARTVIEW test strip  02/16/15   Historical Provider, MD  albuterol (PROVENTIL) (2.5 MG/3ML) 0.083% nebulizer solution Take 3 mLs (2.5 mg total) by nebulization every 6 (six) hours as needed for wheezing or shortness of breath. Patient not taking: Reported on 08/27/2016 05/04/15   Amanda Stain, MD    Physical Exam: Vitals:   08/27/16 0415 08/27/16 0430 08/27/16 0445 08/27/16 0639  BP: 141/66 133/69 148/64 (!) 166/77  Pulse: 74 87 78 88  Resp: 15 20 16 16   Temp:    98.5 F (36.9 C)  TempSrc:    Oral  SpO2: 95% 91% 95% 96%  Weight:    122.4 kg (269 lb 14.4 oz)  Height:        Constitutional:  Pleasant, obese white female in NAD, calm, comfortable Vitals:   08/27/16 0415 08/27/16 0430 08/27/16 0445 08/27/16 0639  BP: 141/66 133/69 148/64 (!) 166/77  Pulse: 74 87 78 88  Resp: 15 20 16 16   Temp:    98.5 F (36.9 C)  TempSrc:    Oral  SpO2: 95% 91% 95% 96%  Weight:     122.4 kg (269 lb 14.4 oz)  Height:       Eyes: PER, lids and conjunctivae normal ENMT: Mucous membranes are moist. Posterior pharynx clear of any exudate or lesions..  Neck: normal, supple, no masses Respiratory:  Bilateral crackles and wheezes. Breathing slightly labored when speaking in long sentences. No accessory use muscles.  Cardiovascular: Regular rate and rhythm, no murmurs / rubs / gallops. No extremity edema. 2+ dorsal pedis pulses.  Abdomen: no tenderness, no masses palpated. No hepatomegaly. Bowel sounds positive.  Musculoskeletal: no clubbing / cyanosis. No joint deformity upper and lower extremities. Good ROM, no contractures. Normal muscle tone.  Skin: no rashes,  ulcers.  RLE: wrapped. Skin flap sutured in ED Neurologic: CN 2-12 grossly intact. Sensation intact, Strength 5/5 in all 4.  Psychiatric: Normal judgment and insight. Alert and oriented x 3. Normal mood.    Labs on Admission: I have personally reviewed following labs and imaging studies Radiological Exams on Admission: Dg Tibia/fibula Right Port  Result Date: 08/27/2016 CLINICAL DATA:  [place] down steps. Large avulsion of the tib-fib area. EXAM: PORTABLE RIGHT TIBIA AND FIBULA - 2 VIEW COMPARISON:  None. FINDINGS: Large superficial soft tissue irregularity along the lateral aspect of the right lower leg consistent with history of avulsion. No radiopaque soft tissue foreign bodies are demonstrated. The right tibia and fibula appear intact. No evidence of acute fracture or dislocation. IMPRESSION: Soft tissue avulsion to the lateral aspect of the right lower leg. No radiopaque soft tissue foreign bodies. No acute bony abnormalities. Electronically Signed   By: Lucienne Capers M.D.   On: 08/27/2016 01:12   Assessment/Plan   Active Problems:   COPD (chronic obstructive pulmonary disease) ([facility])   OSA (obstructive sleep apnea)   AKI (acute kidney injury) ([place])   Borderline diabetes   Hyperlipemia   GERD  (gastroesophageal reflux disease)   Depression   Essential hypertension   Osteoporosis      Mechanical fall with resultant degloving injury RLE. No tib/fib fracture on xray. EDP spoke with Trauma Surgery who recommended approximation / suturing of flap which was carried out by Dr. Regenia Skeeter. Apparently patient unable to beark weight because of the pain.      -place in OBS -Wound Care evaluation -Plastic Surgery evaluation -trial of percocet for pain -PT, OT evaluation. Patient seems deconditioned, also mechanical fall at home.  COPD on home 02. .Breathing at baseline per patient but wheezing / crackles on exam. -obtain CXR -Duonebs -continue home 02  AKI, Cr 1.2, no baseline labs to compare.  -gentle IV hydration next 24 hours.  -am bmet -avoid nephrotoxic medications  OSA  -cpap  GERD.  -continue home PPI  Hypertension, BP controlled in ED -Continue on beta blocker and Lasix  DM2, diet controlled.  Monitor CBGs, SSI -A1c  Osteoporosis, on Fosamax  Depression, stable -continue home anti-depressant.    DVT prophylaxis:     SCD left leg only Code Status:     DNI Family Communication:   None..  Disposition Plan:   Discharge home in 24-48 hours         Consults called:  EDP spoke with Trauma over telephone.  Admission status:   Observation - Medical   Tye Savoy NP [facility] Pager [phone]  If 7PM-7AM, please contact night-coverage [email or web address] Password [facility]  08/27/2016, 7:29 AM

## 2016-08-27 NOTE — ED Notes (Signed)
Spoke with Butch Penny (daughter) and updated her on PT admission status.

## 2016-08-27 NOTE — ED Notes (Signed)
Call Butch Penny (Daughter) for updates and transportation: [phone] cell or [phone] Husband cell or [phone] Home

## 2016-08-27 NOTE — Progress Notes (Signed)
Patient needs Reconstructive Surgery consult. No one on call this weekend but Dr. Lyndee Leo saying GERD dealing hand will be covering inpatient at [facility] tomorrow. Please consult Reconstructive Surgery in the am

## 2016-08-27 NOTE — ED Notes (Signed)
EDP at bedside  

## 2016-08-27 NOTE — Progress Notes (Signed)
Patient refuses CPAP therapy at this time.  Patient states that she wears CPAP at home but does not want to use hospital machine.  Patient was instructed to notify RN or RT if she decides to wear CPAP.

## 2016-08-28 DIAGNOSIS — N189 Chronic kidney disease, unspecified: Secondary | ICD-10-CM | POA: Diagnosis present

## 2016-08-28 DIAGNOSIS — I872 Venous insufficiency (chronic) (peripheral): Secondary | ICD-10-CM | POA: Diagnosis present

## 2016-08-28 DIAGNOSIS — R7303 Prediabetes: Secondary | ICD-10-CM | POA: Diagnosis not present

## 2016-08-28 DIAGNOSIS — E785 Hyperlipidemia, unspecified: Secondary | ICD-10-CM | POA: Diagnosis present

## 2016-08-28 DIAGNOSIS — E1122 Type 2 diabetes mellitus with diabetic chronic kidney disease: Secondary | ICD-10-CM | POA: Diagnosis present

## 2016-08-28 DIAGNOSIS — W109XXA Fall (on) (from) unspecified stairs and steps, initial encounter: Secondary | ICD-10-CM | POA: Diagnosis present

## 2016-08-28 DIAGNOSIS — Z6841 Body Mass Index (BMI) 40.0 and over, adult: Secondary | ICD-10-CM | POA: Diagnosis not present

## 2016-08-28 DIAGNOSIS — G4733 Obstructive sleep apnea (adult) (pediatric): Secondary | ICD-10-CM

## 2016-08-28 DIAGNOSIS — J418 Mixed simple and mucopurulent chronic bronchitis: Secondary | ICD-10-CM

## 2016-08-28 DIAGNOSIS — J9610 Chronic respiratory failure, unspecified whether with hypoxia or hypercapnia: Secondary | ICD-10-CM | POA: Diagnosis present

## 2016-08-28 DIAGNOSIS — D631 Anemia in chronic kidney disease: Secondary | ICD-10-CM | POA: Diagnosis present

## 2016-08-28 DIAGNOSIS — E538 Deficiency of other specified B group vitamins: Secondary | ICD-10-CM | POA: Diagnosis present

## 2016-08-28 DIAGNOSIS — J449 Chronic obstructive pulmonary disease, unspecified: Secondary | ICD-10-CM | POA: Diagnosis present

## 2016-08-28 DIAGNOSIS — Y92009 Unspecified place in unspecified non-institutional (private) residence as the place of occurrence of the external cause: Secondary | ICD-10-CM | POA: Diagnosis not present

## 2016-08-28 DIAGNOSIS — K219 Gastro-esophageal reflux disease without esophagitis: Secondary | ICD-10-CM | POA: Diagnosis present

## 2016-08-28 DIAGNOSIS — S81801A Unspecified open wound, right lower leg, initial encounter: Secondary | ICD-10-CM

## 2016-08-28 DIAGNOSIS — L03115 Cellulitis of right lower limb: Secondary | ICD-10-CM | POA: Diagnosis present

## 2016-08-28 DIAGNOSIS — Z87891 Personal history of nicotine dependence: Secondary | ICD-10-CM | POA: Diagnosis not present

## 2016-08-28 DIAGNOSIS — M810 Age-related osteoporosis without current pathological fracture: Secondary | ICD-10-CM | POA: Diagnosis present

## 2016-08-28 DIAGNOSIS — F329 Major depressive disorder, single episode, unspecified: Secondary | ICD-10-CM | POA: Diagnosis present

## 2016-08-28 DIAGNOSIS — I10 Essential (primary) hypertension: Secondary | ICD-10-CM | POA: Diagnosis not present

## 2016-08-28 DIAGNOSIS — I129 Hypertensive chronic kidney disease with stage 1 through stage 4 chronic kidney disease, or unspecified chronic kidney disease: Secondary | ICD-10-CM | POA: Diagnosis present

## 2016-08-28 DIAGNOSIS — E559 Vitamin D deficiency, unspecified: Secondary | ICD-10-CM | POA: Diagnosis present

## 2016-08-28 DIAGNOSIS — F5104 Psychophysiologic insomnia: Secondary | ICD-10-CM | POA: Diagnosis present

## 2016-08-28 DIAGNOSIS — N179 Acute kidney failure, unspecified: Secondary | ICD-10-CM | POA: Diagnosis present

## 2016-08-28 DIAGNOSIS — E876 Hypokalemia: Secondary | ICD-10-CM | POA: Diagnosis present

## 2016-08-28 DIAGNOSIS — Z23 Encounter for immunization: Secondary | ICD-10-CM | POA: Diagnosis not present

## 2016-08-28 DIAGNOSIS — S81811A Laceration without foreign body, right lower leg, initial encounter: Secondary | ICD-10-CM | POA: Diagnosis present

## 2016-08-28 DIAGNOSIS — Z9981 Dependence on supplemental oxygen: Secondary | ICD-10-CM | POA: Diagnosis not present

## 2016-08-28 DIAGNOSIS — I517 Cardiomegaly: Secondary | ICD-10-CM | POA: Diagnosis not present

## 2016-08-28 DIAGNOSIS — T148 Other injury of unspecified body region: Secondary | ICD-10-CM | POA: Diagnosis present

## 2016-08-28 LAB — BASIC METABOLIC PANEL
Anion gap: 6 (ref 5–15)
BUN: 11 mg/dL (ref 6–20)
CO2: 38 mmol/L — ABNORMAL HIGH (ref 22–32)
Calcium: 8.4 mg/dL — ABNORMAL LOW (ref 8.9–10.3)
Chloride: 95 mmol/L — ABNORMAL LOW (ref 101–111)
Creatinine, Ser: 0.91 mg/dL (ref 0.44–1.00)
GFR calc Af Amer: >60 mL/min (ref >60)
GLUCOSE: 134 mg/dL — AB (ref 65–99)
POTASSIUM: 3.8 mmol/L (ref 3.5–5.1)
SODIUM: 139 mmol/L (ref 135–145)

## 2016-08-28 LAB — CBC
HEMATOCRIT: 32.6 % — AB (ref 36.0–46.0)
HEMOGLOBIN: 9.5 g/dL — AB (ref 12.0–15.0)
MCH: 27.5 pg (ref 26.0–34.0)
MCHC: 29.1 g/dL — AB (ref 30.0–36.0)
MCV: 94.2 fL (ref 78.0–100.0)
Platelets: 158 10*3/uL (ref 150–400)
RBC: 3.46 MIL/uL — ABNORMAL LOW (ref 3.87–5.11)
RDW: 13.4 % (ref 11.5–15.5)
WBC: 7 10*3/uL (ref 4.0–10.5)

## 2016-08-28 LAB — GLUCOSE, CAPILLARY
Glucose-Capillary: 128 mg/dL — ABNORMAL HIGH (ref 65–99)
Glucose-Capillary: 132 mg/dL — ABNORMAL HIGH (ref 65–99)
Glucose-Capillary: 110 mg/dL — ABNORMAL HIGH (ref 65–99)
Glucose-Capillary: 130 mg/dL — ABNORMAL HIGH (ref 65–99)

## 2016-08-28 LAB — HEMOGLOBIN A1C
HEMOGLOBIN A1C: 5.8 % — AB (ref 4.8–5.6)
MEAN PLASMA GLUCOSE: 120 mg/dL

## 2016-08-28 MED ORDER — FUROSEMIDE 10 MG/ML IJ SOLN
60.0000 mg | Freq: Two times a day (BID) | INTRAMUSCULAR | Status: DC
  Administered 2016-08-28 – 2016-09-01 (×7): 60 mg via INTRAVENOUS
  Filled 2016-08-28 (×7): qty 6

## 2016-08-28 MED ORDER — FUROSEMIDE 10 MG/ML IJ SOLN: 40.0000 mg | Freq: Once | INTRAMUSCULAR | Status: DC

## 2016-08-28 MED ORDER — IPRATROPIUM-ALBUTEROL 0.5-2.5 (3) MG/3ML IN SOLN
3.0000 mL | Freq: Four times a day (QID) | RESPIRATORY_TRACT | Status: DC
  Administered 2016-08-28 – 2016-08-29 (×5): 3 mL via RESPIRATORY_TRACT
  Filled 2016-08-28 (×5): qty 3

## 2016-08-28 NOTE — Care Management Obs Status (Signed)
[facility] NOTIFICATION   Patient Details  Name: Amanda Horton MRN: [id number] Date of Birth: 06/17/1946   Medicare Observation Status Notification Given:  Yes    Ninfa Meeker, RN 08/28/2016, 12:06 PM

## 2016-08-28 NOTE — Progress Notes (Signed)
[facility] PROGRESS NOTE  Amanda Horton [id number] DOB: 06-Apr-1946 DOA: 08/26/2016  PCP: Nicoletta Dress, MD  Brief History/Interval Summary: Amanda Horton is a 70 y.o. female with a medical history significant for COPD on home 02. Patient presented to ED after a fall at home. She fell while trying to walk up a step and sustained significant injury to right lower leg. Fall was mechanical without any preceding dizziness or weakness. There was avulsion injury to the skin which was sutured by the EDP.  Reason for Visit: RLE skin avulsion  Consultants: Plastics  Procedures: None yet.  Antibiotics: None  Subjective/Interval History: Patient continues to have some difficulty breathing. She has a cough with clear expectoration. Denies any chest pain. Pain in the right leg is reasonably well controlled.  ROS: Denies nausea, vomiting.  Objective:  Vital Signs  Vitals:   08/28/16 0423 08/28/16 1018 08/28/16 1020 08/28/16 1233  BP: (!) 137/56     Pulse: (!) 101     Resp: 18     Temp: 99.3 F (37.4 C)     TempSrc: Oral     SpO2: 93% 94% 98% 97%  Weight:      Height:        Intake/Output Summary (Last 24 hours) at 08/28/16 1323 Last data filed at 08/28/16 0748  Gross per 24 hour  Intake           1042.5 ml  Output                0 ml  Net           1042.5 ml   Filed Weights   08/26/16 2319 08/27/16 0639  Weight: 113.4 kg (250 lb) 122.4 kg (269 lb 14.4 oz)    General appearance: alert, cooperative, appears stated age and no distress Resp: Scattered wheezes bilaterally. Crackles at the bases. Poor air entry. Cardio: regular rate and rhythm, S1, S2 normal, no murmur, click, rub or gallop GI: soft, non-tender; bowel sounds normal; no masses,  no organomegaly Extremities: Right leg is covered in a dressing. No drainage is noted. Pulses: 2+ and symmetric Neurologic: Awake and alert. Oriented 3. No focal neurological deficits.  Lab Results:  Data Reviewed:  I have personally reviewed following labs and imaging studies  CBC:  Recent Labs Lab 08/26/16 0004 08/28/16 0513  WBC 7.6 7.0  NEUTROABS 5.2  --   HGB 11.1* 9.5*  HCT 37.9 32.6*  MCV 95.2 94.2  PLT 188 0000000    Basic Metabolic Panel:  Recent Labs Lab 08/26/16 0004 08/28/16 0513  NA 140 139  K 4.0 3.8  CL 95* 95*  CO2 39* 38*  GLUCOSE 177* 134*  BUN 20 11  CREATININE 1.20* 0.91  CALCIUM 8.9 8.4*    GFR: Estimated Creatinine Clearance: 71.7 mL/min (by C-G formula based on SCr of 0.91 mg/dL).  Coagulation Profile:  Recent Labs Lab 08/26/16 0004  INR 1.00    HbA1C:  Recent Labs  08/27/16 0916  HGBA1C 5.8*    CBG:  Recent Labs Lab 08/27/16 1126 08/27/16 1628 08/27/16 2322 08/28/16 0706 08/28/16 1147  GLUCAP 129* 122* 122* 128* 132*    Radiology Studies: Dg Chest Port 1 View  Result Date: 08/27/2016 CLINICAL DATA:  70 year old female with a history of COPD EXAM: PORTABLE CHEST 1 VIEW COMPARISON:  None. FINDINGS: Cardiomediastinal silhouette enlarged. Calcifications of the aortic arch. Mixed airspace disease at the bilateral lung bases partially obscuring the heart borders. Right costophrenic angle is excluded.  No pneumothorax. Questionable interlobular septal thickening. IMPRESSION: Cardiomegaly with questionable interstitial edema. Opacities at the lung bases may reflect low lung volumes with atelectasis or consolidation. Correlation with lab values may be useful. Aortic atherosclerosis. Signed, Dulcy Fanny. Earleen Newport, DO [facility] Radiology Electronically Signed   By: Corrie Mckusick D.O.   On: 08/27/2016 16:54   Dg Tibia/fibula Right Port  Result Date: 08/27/2016 CLINICAL DATA:  [place] down steps. Large avulsion of the tib-fib area. EXAM: PORTABLE RIGHT TIBIA AND FIBULA - 2 VIEW COMPARISON:  None. FINDINGS: Large superficial soft tissue irregularity along the lateral aspect of the right lower leg consistent with  history of avulsion. No radiopaque soft tissue foreign bodies are demonstrated. The right tibia and fibula appear intact. No evidence of acute fracture or dislocation. IMPRESSION: Soft tissue avulsion to the lateral aspect of the right lower leg. No radiopaque soft tissue foreign bodies. No acute bony abnormalities. Electronically Signed   By: Lucienne Capers M.D.   On: 08/27/2016 01:12     Medications:  Scheduled: . escitalopram  10 mg Oral Daily  . furosemide  60 mg Intravenous Q12H  . furosemide  80 mg Oral Once  . guaiFENesin  600 mg Oral BID  . insulin aspart  0-9 Units Subcutaneous TID WC  . ipratropium-albuterol  3 mL Nebulization QID  . mometasone-formoterol  2 puff Inhalation BID  . pantoprazole  40 mg Oral Daily  . potassium chloride SA  20 mEq Oral BID   Continuous:  [id number] **OR** acetaminophen, bisacodyl, ipratropium-albuterol, LORazepam, ondansetron **OR** ondansetron (ZOFRAN) IV, oxyCODONE-acetaminophen, polyethylene glycol, temazepam, traZODone  Assessment/Plan:  Active Problems:   Borderline diabetes   Hyperlipemia   GERD (gastroesophageal reflux disease)   COPD (chronic obstructive pulmonary disease) (HCC)   OSA (obstructive sleep apnea)   Depression   Essential hypertension   Osteoporosis   AKI (acute kidney injury) ([place])   Degloving injury of lower leg   Fall    Mechanical fall with resultant degloving injury RLE No fractures identified on x-ray. EDP spoke with Trauma Surgery who recommended approximation / suturing of flap which was carried out by Dr. Regenia Skeeter. Patient with significant pain. Wound care has been consulted. Discussed with Dr. Marla Roe with plastic surgery, who will evaluate the patient tomorrow morning. Continue no code home care. PT and OT evaluation.  History of COPD on home O2 with possible superimposed pulmonary edema Has scattered wheezes bilaterally but also has crackles. There could be an element of pulmonary edema.  Patient denies any history of heart failure. However she is noted to be on Lasix at home. We will give it intravenously for now. Order echocardiogram. Continue with nebulizer treatments. Hold off on steroids. Continue oxygen. Strict ins and outs. Daily weight.  Mild AKI Creatinine is 1.2. Patient renal function is normal today. Continue to monitor closely. Stop IV fluids.  Normocytic anemia Some drop in hemoglobin as noted. Some of this could be due to bleeding associated with the fall and the right leg injury. Continue to monitor for now. No active bleeding is noted at this time.  OSA  Continue CPAP  GERD.  Continue home PPI  History of essential Hypertension Continue home medications. Monitor Blood pressures closely.  DM2, diet controlled.  Monitor CBGs, SSI. HbA1c is 5.8.  Osteoporosis Continue home medications  Depression Continue home anti-depressant.   DVT Prophylaxis: SCDs   Code Status: Partial code  Family Communication: Discussed with the patient. No family at bedside  Disposition Plan:  Management as outlined above.    LOS: 0 days   [facility] Pager [phone] 08/28/2016, 1:23 PM  If 7PM-7AM, please contact night-coverage at [email or web address], password [facility]

## 2016-08-28 NOTE — Evaluation (Signed)
Physical Therapy Evaluation Patient Details Name: Jermica Cramm MRN: [id number] DOB: 05-Sep-1946 Today's Date: 08/28/2016   History of Present Illness  This is a 82 7.o. female s/p fall and degloving injury of her R lower leg that was reapproximated in ED. 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   Clinical Impression  Pt overall very deconditioned and requires A for all aspects of mobility.  Pt indicates she has been limiting her activity at home due to the amount of effort it has been taking her just to mobilize.  Pt indicates her family work during the day, so she is alone throughout the day.  Feel pt would benefit from continued therapy at a SNF level to maximize independence and decrease overall burden of care.  Will continue to follow.      Follow Up Recommendations SNF    Equipment Recommendations  Rolling walker with 5" wheels;Wheelchair (measurements PT);Wheelchair cushion (measurements PT)    Recommendations for Other Services       Precautions / Restrictions Precautions Precautions: Fall Precaution Comments: Home O2 Restrictions Weight Bearing Restrictions: No      Mobility  Bed Mobility Overal bed mobility: Needs Assistance Bed Mobility: Supine to Sit     Supine to sit: Min assist;HOB elevated     General bed mobility comments: pt needs increased time and increased effort.  A needed for bringing trunk up to sitting only.    Transfers Overall transfer level: Needs assistance Equipment used: Rolling walker (2 wheeled) Transfers: Sit to/from Stand Sit to Stand: Min guard         General transfer comment: cues for UE use.    Ambulation/Gait Ambulation/Gait assistance: Min guard Ambulation Distance (Feet): 15 Feet (x2) Assistive device: Rolling walker (2 wheeled) Gait  Pattern/deviations: Step-through pattern;Decreased stride length     General Gait Details: pt indicates fatigue with ambulation and was only able to toelrate ambulating to the toilet and back.  pt with increased WOB and requested O2 increased to 3L as she does at home when mobilizing.    Stairs            Wheelchair Mobility    Modified Rankin (Stroke Patients Only)       Balance Overall balance assessment: Needs assistance Sitting-balance support: No upper extremity supported;Feet supported Sitting balance-Leahy Scale: Fair     Standing balance support: Single extremity supported;Bilateral upper extremity supported;During functional activity Standing balance-Leahy Scale: Poor                               Pertinent Vitals/Pain Pain Assessment: Faces Faces Pain Scale: Hurts little more Pain Location: R LE Pain Descriptors / Indicators: Aching;Grimacing;Guarding Pain Intervention(s): Monitored during session;Premedicated before session;Repositioned    Home Living Family/patient expects to be discharged to:: Private residence Living Arrangements: Children (Son available at night) Available Help at Discharge: Family;Available PRN/intermittently Type of Home: House Home Access: Stairs to enter Entrance Stairs-Rails: None Entrance Stairs-Number of Steps: 2 Home Layout: Two level;Able to live on main level with bedroom/bathroom Home Equipment: Walker - 4 wheels;Grab bars - tub/shower;Shower seat Additional Comments: Family works during the day; available at night and intermittently    Prior Function Level of Independence: Needs assistance (Daughter assists with bathing)   Gait / Transfers Assistance Needed: Min guard assist  ADL's / Homemaking Assistance Needed: Pt reports duaghter helps with bath and son helps to don/doff shoes/socks        Hand Dominance   Dominant Hand: Right    Extremity/Trunk Assessment   Upper Extremity Assessment: Defer to  OT evaluation RUE Deficits / Details: Limited ability to reach behind back for ADLs     LUE Deficits / Details: Limited ability to reach behind back for ADLs.   Lower Extremity Assessment: Generalized weakness;RLE deficits/detail RLE Deficits / Details: Overall weak.  Sensation intact, Strength not formally tested due to large wound on R lower leg.  AROM WFL.      Cervical / Trunk Assessment: Kyphotic  Communication   Communication: No difficulties  Cognition Arousal/Alertness: Awake/alert Behavior During Therapy: WFL for tasks assessed/performed Overall Cognitive Status: Within Functional Limits for tasks assessed                      General Comments      Exercises     Assessment/Plan    PT Assessment Patient needs continued PT services  PT Problem List Decreased strength;Decreased activity tolerance;Decreased balance;Decreased mobility;Decreased knowledge of use of DME;Cardiopulmonary status limiting activity;Obesity          PT Treatment Interventions DME instruction;Gait training;Stair training;Functional mobility training;Therapeutic activities;Therapeutic exercise;Balance training;Patient/family education    PT Goals (Current goals can be found in the Care Plan section)  Acute Rehab PT Goals Patient Stated Goal: to get better and go home PT Goal Formulation: With patient Time For Goal Achievement: 09/04/16 Potential to Achieve Goals: Good    Frequency Min 3X/week   Barriers to discharge Decreased caregiver support      Co-evaluation PT/OT/SLP Co-Evaluation/Treatment: Yes Reason for Co-Treatment: For patient/therapist safety PT goals addressed during session: Mobility/safety with mobility;Balance;Proper use of DME OT goals addressed during session: ADL's and self-care;Proper use of Adaptive equipment and DME       End of Session Equipment Utilized During Treatment: Gait belt;Oxygen Activity Tolerance: Patient limited by fatigue Patient left: in  chair;with call bell/phone within reach Nurse Communication: Mobility status         Time: [id number] PT Time Calculation (min) (ACUTE ONLY): 37 min   Charges:   PT Evaluation $PT Eval Moderate Complexity: 1 Procedure     PT G CodesCatarina Hartshorn, [place] 08/28/2016, 12:31 PM

## 2016-08-28 NOTE — Evaluation (Signed)
Occupational Therapy Evaluation Patient Details Name: Amanda Horton MRN: [id number] DOB: [birth year] Today's Date: 08/28/2016    History of Present Illness This is a 42 7.o. female s/p fall and degloving injury of her R lower leg that was reapproximated in ED. 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    Clinical Impression   PTA, pt required min assistance with LB bathing and dressing tasks from family. Currently, pt requires max assistance with LB dressing and bathing as well as min guard assist for functional mobility secondary to fall with R lower leg injury and decreased activity tolerance due to COPD. Began education concerning use of AE to increase independence with toileting hygeine and energy conservation. Pt verbalizes understanding but requires further reinforcement. OT will continue to follow acutely and recommend D/C to SNF as pt does not have 24 hour assistance/supervision at home at this time.   Follow Up Recommendations  SNF    Equipment Recommendations  None recommended by OT       Precautions / Restrictions Precautions Precautions: Fall Precaution Comments: Home O2 Restrictions Weight Bearing Restrictions: No      Mobility Bed Mobility Overal bed mobility: Needs Assistance Bed Mobility: Supine to Sit     Supine to sit: Min assist;HOB elevated     General bed mobility comments: pt needs increased time and increased effort.  A needed for bringing trunk up to sitting only.    Transfers Overall transfer level: Needs assistance Equipment used: Rolling walker (2 wheeled) Transfers: Sit to/from Stand Sit to Stand: Min guard         General transfer comment: cues for UE use.      Balance Overall balance assessment: Needs assistance Sitting-balance support: No upper  extremity supported;Feet supported Sitting balance-Leahy Scale: Fair     Standing balance support: Single extremity supported;Bilateral upper extremity supported;During functional activity Standing balance-Leahy Scale: Poor                              ADL Overall ADL's : Needs assistance/impaired Eating/Feeding: Supervision/ safety;Set up;Sitting   Grooming: Supervision/safety;Set up;Sitting   Upper Body Bathing: Sitting;Minimal assitance   Lower Body Bathing: Moderate assistance;Sit to/from stand (min guard sit<>stand) Lower Body Bathing Details (indicate cue type and reason): At baseline, pt's daughter assists with bathing Upper Body Dressing : Minimal assistance (Limited ability to reach behind back)   Lower Body Dressing: Maximal assistance;Sit to/from stand (min guard sit <>stand; typically wears dresses)   Toilet Transfer: Min guard;Ambulation;BSC;RW (BSC over toilet)   Toileting- Clothing Manipulation and Hygiene: Maximal assistance (Max Assist for pericare/toileting hygeine for thoroughness)       Functional mobility during ADLs: Min guard                 Pertinent Vitals/Pain Pain Assessment: Faces Faces Pain Scale: Hurts little more Pain Location: R LE Pain Descriptors / Indicators: Aching;Grimacing;Guarding Pain Intervention(s): Monitored during session;Premedicated before session;Repositioned     Hand Dominance Right   Extremity/Trunk Assessment Upper Extremity Assessment Upper Extremity Assessment: Defer to OT evaluation RUE Deficits / Details: Limited ability to reach behind back for ADLs LUE Deficits / Details: Limited ability to reach behind back for ADLs.   Lower Extremity Assessment Lower Extremity Assessment: Generalized weakness;RLE deficits/detail RLE Deficits / Details: Overall weak.  Sensation  intact, Strength not formally tested due to large wound on R lower leg.  AROM WFL.     Cervical / Trunk Assessment Cervical / Trunk  Assessment: Kyphotic   Communication Communication Communication: No difficulties   Cognition Arousal/Alertness: Awake/alert Behavior During Therapy: WFL for tasks assessed/performed Overall Cognitive Status: Within Functional Limits for tasks assessed                                Home Living Family/patient expects to be discharged to:: Private residence Living Arrangements: Children (Son available at night) Available Help at Discharge: Family;Available PRN/intermittently Type of Home: House Home Access: Stairs to enter [employer] of Steps: 2 Entrance Stairs-Rails: None Home Layout: Two level;Able to live on main level with bedroom/bathroom Alternate Level Stairs-Number of Steps: 2 Alternate Level Stairs-Rails: Right Bathroom Shower/Tub: Tub/shower unit Shower/tub characteristics: Curtain Biochemist, clinical: Standard     Home Equipment: Environmental consultant - 4 wheels;Grab bars - tub/shower;Shower seat   Additional Comments: Family works during the day; available at night and intermittently      Prior Functioning/Environment Level of Independence: Needs assistance (Daughter assists with bathing)  Gait / Transfers Assistance Needed: Min guard assist ADL's / Homemaking Assistance Needed: Pt reports duaghter helps with bath and son helps to don/doff shoes/socks            OT Problem List: Decreased strength;Decreased range of motion;Decreased activity tolerance;Impaired balance (sitting and/or standing);Decreased knowledge of use of DME or AE;Cardiopulmonary status limiting activity;Pain   OT Treatment/Interventions: Self-care/ADL training;Energy conservation;DME and/or AE instruction;Therapeutic activities;Patient/family education    OT Goals(Current goals can be found in the care plan section) Acute Rehab OT Goals Patient Stated Goal: to get better and go home OT Goal Formulation: With patient Time For Goal Achievement: 09/10/16 Potential to Achieve Goals:  Good ADL Goals Pt Will Perform Upper Body Bathing: with min guard assist;sitting Pt Will Perform Upper Body Dressing: with min guard assist;sitting Pt Will Perform Tub/Shower Transfer: with min guard assist Additional ADL Goal #1: Pt will verbalize and/or demonstrate energy conservation techniques during UB dressing while seated at EOB.  OT Frequency: Min 2X/week   Barriers to D/C:            Co-evaluation PT/OT/SLP Co-Evaluation/Treatment: Yes Reason for Co-Treatment: For patient/therapist safety PT goals addressed during session: Mobility/safety with mobility;Balance;Proper use of DME OT goals addressed during session: ADL's and self-care;Proper use of Adaptive equipment and DME      End of Session Equipment Utilized During Treatment: Rolling walker;Gait belt  Activity Tolerance: Patient tolerated treatment well Patient left: in chair;with call bell/phone within reach   Time: [id number] OT Time Calculation (min): 40 min Charges:  OT General Charges $OT Visit: 1 Procedure OT Evaluation $OT Eval Moderate Complexity: 1 Procedure G-Codes: OT G-codes **NOT FOR INPATIENT CLASS** Functional Limitation: Self care Self Care Current Status [id number]): At least 20 percent but less than 40 percent impaired, limited or restricted Self Care Goal Status [id number]): At least 1 percent but less than 20 percent impaired, limited or restricted  Norman Herrlich, OTR/L [phone] 08/28/2016, 12:34 PM

## 2016-08-28 NOTE — Consult Note (Signed)
[facility] Nurse wound consult note Reason for Consult: avulsion of right lateral lower extremity Wound type: traumatic full thickness avulsion wound Pressure Ulcer POA: NA Measurement: 45 x 5 x 0.2 cm Wound bed: beefy red with sutures holding skin flaps, some areas of approximated edges and some unapproximated.  Widest area is 5 cm x 0.2 cm deep. Drainage (amount, consistency, odor) scant sanguinous drainage, thin, no odor Periwound: ecchymotic with hematoma to posterior calf, mild cellulitis from medial to lateral ankle Dressing procedure/placement/frequency: Cleanse right lateral lower extremity with NS.  Apply Xeroform petrolatum dressing x 3 to suture/wound line.  Cover with telfa pads x 3.  Pad with ABDs x 3-4 and wrap with kerlix.  Secure with paper tape.  Avoid use of tape to skin especially under kerlix. Change dressing daily. Elevate leg at all times.   Discussed POC with patient and bedside nurse.  Will see pt on Wednesday.  If discharged before Wednesday, pt will need to be seen at a wound care clinic for follow up and will need home care RN for daily wound care. Dorna Bloom BSN, RN, CWOCN

## 2016-08-28 NOTE — Progress Notes (Signed)
Patient refuses CPAP for the night  

## 2016-08-29 ENCOUNTER — Ambulatory Visit: Payer: Self-pay | Admitting: Plastic Surgery

## 2016-08-29 ENCOUNTER — Encounter (HOSPITAL_COMMUNITY): Payer: Self-pay

## 2016-08-29 DIAGNOSIS — S81801A Unspecified open wound, right lower leg, initial encounter: Principal | ICD-10-CM

## 2016-08-29 DIAGNOSIS — S81001A Unspecified open wound, right knee, initial encounter: Secondary | ICD-10-CM

## 2016-08-29 DIAGNOSIS — S91001A Unspecified open wound, right ankle, initial encounter: Principal | ICD-10-CM

## 2016-08-29 LAB — FERRITIN: FERRITIN: 66 ng/mL (ref 11–307)

## 2016-08-29 LAB — GLUCOSE, CAPILLARY
GLUCOSE-CAPILLARY: 120 mg/dL — AB (ref 65–99)
GLUCOSE-CAPILLARY: 98 mg/dL (ref 65–99)
GLUCOSE-CAPILLARY: 121 mg/dL — AB (ref 65–99)
Glucose-Capillary: 128 mg/dL — ABNORMAL HIGH (ref 65–99)

## 2016-08-29 LAB — IRON AND TIBC
Iron: 18 ug/dL — ABNORMAL LOW (ref 28–170)
Saturation Ratios: 6 % — ABNORMAL LOW (ref 10.4–31.8)
TIBC: 315 ug/dL (ref 250–450)
UIBC: 297 ug/dL

## 2016-08-29 LAB — CBC
HCT: 32.1 % — ABNORMAL LOW (ref 36.0–46.0)
Hemoglobin: 9.4 g/dL — ABNORMAL LOW (ref 12.0–15.0)
MCH: 27.7 pg (ref 26.0–34.0)
MCHC: 29.3 g/dL — AB (ref 30.0–36.0)
MCV: 94.7 fL (ref 78.0–100.0)
PLATELETS: 164 10*3/uL (ref 150–400)
RBC: 3.39 MIL/uL — ABNORMAL LOW (ref 3.87–5.11)
RDW: 13.4 % (ref 11.5–15.5)
WBC: 6.9 10*3/uL (ref 4.0–10.5)

## 2016-08-29 LAB — BASIC METABOLIC PANEL
Anion gap: 7 (ref 5–15)
BUN: 7 mg/dL (ref 6–20)
CO2: 44 mmol/L — ABNORMAL HIGH (ref 22–32)
CREATININE: 1.01 mg/dL — AB (ref 0.44–1.00)
Calcium: 8.4 mg/dL — ABNORMAL LOW (ref 8.9–10.3)
Chloride: 91 mmol/L — ABNORMAL LOW (ref 101–111)
GFR calc Af Amer: >60 mL/min (ref >60)
GFR, EST NON AFRICAN AMERICAN: 55 mL/min — AB (ref >60)
GLUCOSE: 121 mg/dL — AB (ref 65–99)
POTASSIUM: 3.6 mmol/L (ref 3.5–5.1)
Sodium: 142 mmol/L (ref 135–145)

## 2016-08-29 LAB — FOLATE: Folate: 15.9 ng/mL (ref >5.9)

## 2016-08-29 LAB — RETICULOCYTES
RBC.: 3.39 MIL/uL — ABNORMAL LOW (ref 3.87–5.11)
RETIC CT PCT: 1.7 % (ref 0.4–3.1)
Retic Count, Absolute: 57.6 10*3/uL (ref 19.0–186.0)

## 2016-08-29 LAB — VITAMIN B12: Vitamin B-12: 844 pg/mL (ref 180–914)

## 2016-08-29 MED ORDER — VANCOMYCIN HCL IN DEXTROSE 1-5 GM/200ML-% IV SOLN
1000.0000 mg | Freq: Two times a day (BID) | INTRAVENOUS | Status: DC
  Administered 2016-08-29 – 2016-09-01 (×5): 1000 mg via INTRAVENOUS
  Filled 2016-08-29 (×7): qty 200

## 2016-08-29 MED ORDER — VANCOMYCIN HCL 10 G IV SOLR
2000.0000 mg | Freq: Once | INTRAVENOUS | Status: AC
  Administered 2016-08-29: 2000 mg via INTRAVENOUS
  Filled 2016-08-29: qty 2000

## 2016-08-29 NOTE — Progress Notes (Signed)
Dressing changed to RLE wound, sutures intact, large amount of serosanguinous drainage noted with big blister, wound cleaner with NS , Xeroform applied with Telfa and covered with 3 ABD pads and Kerlix roll, patient tolerated well.

## 2016-08-29 NOTE — [place] FL2 (Signed)
[place] MEDICAID FL2 LEVEL OF CARE SCREENING TOOL     IDENTIFICATION  Patient Name: Amanda Horton Birthdate: 12-15-1945 Sex: female Admission Date (Current Location): 08/26/2016  [facility] and [place] Number:  Herbalist and Address:  [facility] [place]. [facility], [place] [address] [place], [place] [address]      Provider Number: [id number]  Attending Physician Name and Address:  Bonnielee Haff, MD  Relative Name and Phone Number:       Current Level of Care: Hospital Recommended Level of Care: [place] Prior Approval Number:    Date Approved/Denied:   PASRR Number:  ([id number])  Discharge Plan: SNF    Current Diagnoses: Patient Active Problem List   Diagnosis Date Noted  . Degloving injury 08/27/2016  . Depression 08/27/2016  . Essential hypertension 08/27/2016  . Osteoporosis 08/27/2016  . AKI (acute kidney injury) ([place]) 08/27/2016  . Degloving injury of lower leg   . Fall   . Vitamin B12 deficiency   . Borderline diabetes   . Hyperlipemia   . GERD (gastroesophageal reflux disease)   . COPD (chronic obstructive pulmonary disease) ([place])   . OSA (obstructive sleep apnea)   . Varicose veins with pain   . Chronic venous insufficiency     Orientation RESPIRATION BLADDER Height & Weight     Self, Time, Situation, Place  O2 Continent Weight: 269 lb 14.4 oz (122.4 kg) Height:  5\' 2"  (157.5 cm)  BEHAVIORAL SYMPTOMS/MOOD NEUROLOGICAL BOWEL NUTRITION STATUS      Continent  (cARB MODIFIED DIET)  AMBULATORY STATUS COMMUNICATION OF NEEDS Skin   Limited Assist Verbally Normal                       Personal Care Assistance Level of Assistance  Dressing, Bathing Bathing Assistance: Maximum assistance   Dressing Assistance: Maximum assistance     Functional Limitations Info             SPECIAL CARE FACTORS FREQUENCY                       Contractures      Additional Factors Info   (pARTIAL)               Current Medications (08/29/2016):  This is the current hospital active medication list Current Facility-Administered Medications  Medication Dose Route Frequency Provider Last Rate Last Dose  . acetaminophen (TYLENOL) tablet 650 mg  650 mg Oral Q6H PRN Willia Craze, NP       Or  . acetaminophen (TYLENOL) suppository 650 mg  650 mg Rectal Q6H PRN Willia Craze, NP      . bisacodyl (DULCOLAX) EC tablet 5 mg  5 mg Oral Daily PRN Willia Craze, NP      . escitalopram (LEXAPRO) tablet 10 mg  10 mg Oral Daily Willia Craze, NP   10 mg at 08/29/16 0930  . furosemide (LASIX) injection 60 mg  60 mg Intravenous Q12H Bonnielee Haff, MD   60 mg at 08/29/16 0930  . furosemide (LASIX) tablet 80 mg  80 mg Oral Once Truett Mainland, DO      . guaiFENesin (MUCINEX) 12 hr tablet 600 mg  600 mg Oral BID Willia Craze, NP   600 mg at 08/29/16 0931  . insulin aspart (novoLOG) injection 0-9 Units  0-9 Units Subcutaneous TID WC Willia Craze, NP   1 Units at 08/29/16 1200  .  ipratropium-albuterol (DUONEB) 0.5-2.5 (3) MG/3ML nebulizer solution 3 mL  3 mL Nebulization Q4H PRN Willia Craze, NP      . LORazepam (ATIVAN) tablet 1 mg  1 mg Oral Q8H PRN Willia Craze, NP      . mometasone-formoterol [facility]) 200-5 MCG/ACT inhaler 2 puff  2 puff Inhalation BID Willia Craze, NP   2 puff at 08/29/16 [phone]  . ondansetron (ZOFRAN) tablet 4 mg  4 mg Oral Q6H PRN Willia Craze, NP   4 mg at 08/27/16 2332   Or  . ondansetron (ZOFRAN) injection 4 mg  4 mg Intravenous Q6H PRN Willia Craze, NP      . oxyCODONE-acetaminophen (PERCOCET/ROXICET) 5-325 MG per tablet 1 tablet  1 tablet Oral Q6H PRN Willia Craze, NP   1 tablet at 08/29/16 0547  . pantoprazole (PROTONIX) EC tablet 40 mg  40 mg Oral Daily Willia Craze, NP   40 mg at 08/29/16 0930  . polyethylene glycol (MIRALAX / GLYCOLAX) packet 17 g  17 g Oral Daily PRN Willia Craze, NP      . potassium chloride SA (K-DUR,KLOR-CON) CR  tablet 20 mEq  20 mEq Oral BID Willia Craze, NP   20 mEq at 08/29/16 0930  . temazepam (RESTORIL) capsule 15 mg  15 mg Oral QHS PRN Willia Craze, NP      . traZODone (DESYREL) tablet 25 mg  25 mg Oral QHS PRN Willia Craze, NP      . Derrill Memo ON 08/30/2016] vancomycin (VANCOCIN) IVPB 1000 mg/200 mL premix  1,000 mg Intravenous Q12H Theone Murdoch Hammons, [facility]         Discharge Medications: Please see discharge summary for a list of discharge medications.  Relevant Imaging Results:  Relevant Lab Results:   Additional Information  (SS:; [id number])  Venetia Maxon, Lynnda Child

## 2016-08-29 NOTE — Consult Note (Addendum)
WOC follow-up: Plastics team is now following for assessment and plan of care; refer to progress notes on 9/26.  They plan to take pt to surgery for ACell and Vac placement, according to the EMR.   Please refer to their team for further plan of care and re-consult if further assistance is needed.  Thank-you,  Julien Girt MSN, [place], [place], [place], [place]

## 2016-08-29 NOTE — Plan of Care (Signed)
Problem: Safety: Goal: Ability to remain free from injury will improve Outcome: Progressing Safety precautions maintained  Problem: Pain Managment: Goal: General experience of comfort will improve Outcome: Progressing Denies pain  Problem: Skin Integrity: Goal: Risk for impaired skin integrity will decrease Outcome: Not Progressing Wound to RLE, bruises to arms and legs  Problem: Tissue Perfusion: Goal: Risk factors for ineffective tissue perfusion will decrease Outcome: Progressing Denies S/S of DVT  Problem: Activity: Goal: Risk for activity intolerance will decrease Outcome: Progressing oob to chair with assistance, tolerates well  Problem: Bowel/Gastric: Goal: Will not experience complications related to bowel motility Outcome: Progressing No bowel issues reported

## 2016-08-29 NOTE — Plan of Care (Signed)
Problem: Education: Goal: Knowledge of [facility] Education information/materials will improve Outcome: Progressing Pt compliant with plan of care

## 2016-08-29 NOTE — Progress Notes (Signed)
[facility] PROGRESS NOTE  Amanda Horton [id number] DOB: 1946/09/09 DOA: 08/26/2016  PCP: Nicoletta Dress, MD  Brief History/Interval Summary: Amanda Horton is a 70 y.o. female with a medical history significant for COPD on home 02. Patient presented to ED after a fall at home. She fell while trying to walk up a step and sustained significant injury to right lower leg. Fall was mechanical without any preceding dizziness or weakness. There was avulsion injury to the skin which was sutured by the EDP.  Reason for Visit: RLE skin avulsion  Consultants: Plastics  Procedures: None yet.  Antibiotics: None  Subjective/Interval History: Patient noted to be comfortable lying flat on the bed. She states that her breathing continues to be a little labored and that she breathes better than this at home. Denies any cough. Some wheezing is present. Denies any chest pain.   ROS: Denies nausea, vomiting.  Objective:  Vital Signs  Vitals:   08/28/16 1948 08/28/16 2004 08/29/16 0431 08/29/16 0823  BP:  (!) 118/52 (!) 133/53   Pulse: 92 97 100   Resp: 20 18 18    Temp:  98.3 F (36.8 C) 99 F (37.2 C)   TempSrc:  Oral Oral   SpO2: 98% 93% 96% 96%  Weight:      Height:        Intake/Output Summary (Last 24 hours) at 08/29/16 0945 Last data filed at 08/29/16 0656  Gross per 24 hour  Intake              150 ml  Output                6 ml  Net              144 ml   Filed Weights   08/26/16 2319 08/27/16 0639  Weight: 113.4 kg (250 lb) 122.4 kg (269 lb 14.4 oz)    General appearance: alert, cooperative, appears stated age and no distress Resp: Improved air entry bilaterally. Less wheezing today. Fewer crackles.  Cardio: regular rate and rhythm, S1, S2 normal, no murmur, click, rub or gallop GI: soft, non-tender; bowel sounds normal; no masses,  no organomegaly Extremities: Right leg is covered in a dressing. No drainage is noted. Pulses: 2+ and symmetric Neurologic:  Awake and alert. Oriented 3. No focal neurological deficits.  Lab Results:  Data Reviewed: I have personally reviewed following labs and imaging studies  CBC:  Recent Labs Lab 08/26/16 0004 08/28/16 0513 08/29/16 0405  WBC 7.6 7.0 6.9  NEUTROABS 5.2  --   --   HGB 11.1* 9.5* 9.4*  HCT 37.9 32.6* 32.1*  MCV 95.2 94.2 94.7  PLT 188 158 [id number]    Basic Metabolic Panel:  Recent Labs Lab 08/26/16 0004 08/28/16 0513 08/29/16 0405  NA 140 139 142  K 4.0 3.8 3.6  CL 95* 95* 91*  CO2 39* 38* 44*  GLUCOSE 177* 134* 121*  BUN 20 11 7   CREATININE 1.20* 0.91 1.01*  CALCIUM 8.9 8.4* 8.4*    GFR: Estimated Creatinine Clearance: 64.6 mL/min (by C-G formula based on SCr of 1.01 mg/dL (H)).  Coagulation Profile:  Recent Labs Lab 08/26/16 0004  INR 1.00    HbA1C:  Recent Labs  08/27/16 0916  HGBA1C 5.8*    CBG:  Recent Labs Lab 08/28/16 0706 08/28/16 1147 08/28/16 1648 08/28/16 2312 08/29/16 0616  GLUCAP 128* 132* 110* 130* 120*    Radiology Studies: Dg Chest Port 1 View  Result Date: 08/27/2016  CLINICAL DATA:  70 year old female with a history of COPD EXAM: PORTABLE CHEST 1 VIEW COMPARISON:  None. FINDINGS: Cardiomediastinal silhouette enlarged. Calcifications of the aortic arch. Mixed airspace disease at the bilateral lung bases partially obscuring the heart borders. Right costophrenic angle is excluded. No pneumothorax. Questionable interlobular septal thickening. IMPRESSION: Cardiomegaly with questionable interstitial edema. Opacities at the lung bases may reflect low lung volumes with atelectasis or consolidation. Correlation with lab values may be useful. Aortic atherosclerosis. Signed, Dulcy Fanny. Earleen Newport, DO [facility] Radiology Electronically Signed   By: Corrie Mckusick D.O.   On: 08/27/2016 16:54     Medications:  Scheduled: . escitalopram  10 mg Oral Daily  . furosemide  60 mg Intravenous Q12H  .  furosemide  80 mg Oral Once  . guaiFENesin  600 mg Oral BID  . insulin aspart  0-9 Units Subcutaneous TID WC  . mometasone-formoterol  2 puff Inhalation BID  . pantoprazole  40 mg Oral Daily  . potassium chloride SA  20 mEq Oral BID   Continuous:  [id number] **OR** acetaminophen, bisacodyl, ipratropium-albuterol, LORazepam, ondansetron **OR** ondansetron (ZOFRAN) IV, oxyCODONE-acetaminophen, polyethylene glycol, temazepam, traZODone  Assessment/Plan:  Active Problems:   Borderline diabetes   Hyperlipemia   GERD (gastroesophageal reflux disease)   COPD (chronic obstructive pulmonary disease) (HCC)   OSA (obstructive sleep apnea)   Degloving injury   Depression   Essential hypertension   Osteoporosis   AKI (acute kidney injury) ([place])   Degloving injury of lower leg   Fall    Mechanical fall with resultant degloving injury RLE No fractures identified on x-ray. EDP spoke with Trauma Surgery who recommended approximation / suturing of flap which was carried out by Dr. Regenia Skeeter. Patient's pain is reasonably well controlled. Wound care has been consulted. Discussed with Dr. Marla Roe with plastic surgery, who has seen the patient this morning. Plans for surgical intervention tomorrow. Patient noted to have low-grade fever. Plastic surgery note mentions that there is erythema. In view of this, we will initiate intravenous antibiotics in the form of vancomycin. She denies any history of diabetes. PT and OT evaluation.  History of COPD on home O2 with possible superimposed pulmonary edema Date entry seems to have improved some. She has fewer crackles today compared to yesterday. Less wheezing as well. Continue Lasix intravenously for one more day and then transitioned to oral tomorrow. Echocardiogram is pending. Continue oxygen. Hold off on steroids. Strict ins and outs and daily weights.   Mild AKI Creatinine is stable. Continue to monitor closely. Stopped IV fluids.  Normocytic  anemia Some drop in hemoglobin as noted. Some of this could be due to bleeding associated with the fall and the right leg injury. Continue to monitor for now. No active bleeding is noted at this time.Anemia panel reviewed.  OSA  Continue CPAP  GERD.  Continue home PPI  History of essential Hypertension Continue home medications. Monitor Blood pressures closely.  Questionable history of DM2, diet controlled.  Patient denies any history of diabetes. HbA1c is 5.8.  Osteoporosis Continue home medications  Depression Continue home anti-depressant.   DVT Prophylaxis: SCDs   Code Status: Partial code  Family Communication: Discussed with the patient. No family at bedside  Disposition Plan: Management as outlined above. Will likely need placement when ready for discharge.    LOS: 1 day   [facility] Pager [phone] 08/29/2016, 9:45 AM  If 7PM-7AM, please contact night-coverage at [email or web address], password [email or web address]

## 2016-08-29 NOTE — Plan of Care (Signed)
Problem: Pain Managment: Goal: General experience of comfort will improve Outcome: Progressing Pain controlled

## 2016-08-29 NOTE — Progress Notes (Signed)
Pharmacy Antibiotic Note  Amanda Horton is a 70 y.o. female admitted on 08/26/2016 with cellulitis s/p degloving injury to RLE.  Pharmacy has been consulted for Vancomycin dosing.  Plan: Vancomycin 2gm IV x 1 loading dose followed by Vancomycin 1gm IV every 12 hours.  Goal trough 10-15 mcg/mL.  Height: 5\' 2"  (157.5 cm) Weight: 269 lb 14.4 oz (122.4 kg) IBW/kg (Calculated) : 50.1  Temp (24hrs), Avg:98.6 F (37 C), Min:98.3 F (36.8 C), Max:99 F (37.2 C)   Recent Labs Lab 08/26/16 0004 08/28/16 0513 08/29/16 0405  WBC 7.6 7.0 6.9  CREATININE 1.20* 0.91 1.01*    Estimated Creatinine Clearance: 64.6 mL/min (by C-G formula based on SCr of 1.01 mg/dL (H)).    No Known Allergies  Antimicrobials this admission: Vanc 9/26 >>  Dose adjustments this admission:   Microbiology results: No cx data (as of 9/26)  Thank you for allowing pharmacy to be a part of this patient's care.  [employer], Pharm.D., BCPS Clinical Pharmacist Pager [phone] 08/29/2016 11:11 AM

## 2016-08-29 NOTE — Progress Notes (Addendum)
Occupational Therapy Treatment Patient Details Name: Amanda Horton MRN: [id number] DOB: Jul 08, 1946 Today's Date: 08/29/2016    History of present illness This is a 70 y.o. female s/p fall and degloving injury of her R lower leg that was reapproximated in ED. 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    OT comments  Pt continues to require min guard assistance for ADL transfers and functional mobility this date to ambulate for grooming task while standing at sink. Decreased activity tolerance noted with all activity. SpO2 stats prior to and during activity as follows: 92% at rest on 2 L O2, 91% after standing on 3 L O2, 84% on 3 L O2 following 3 minutes of activity. Promtply returned to sitting and encouraged pursed lip breathing techniques with SpO2 returning to 96% at rest on 2L O2 within 2 minutes. Completed education concerning energy conservation during ADLs as well as pursed lip breathing techniques. Handout provided concerning energy conservation. Pt needs further reinforcement. Will continue to follow acutely. D/C recommendations remain appropriate.  Follow Up Recommendations  SNF    Equipment Recommendations  None recommended by OT    Recommendations for Other Services      Precautions / Restrictions Precautions Precautions: Fall Precaution Comments: Home O2 Restrictions Weight Bearing Restrictions: No       Mobility Bed Mobility Overal bed mobility: Needs Assistance Bed Mobility: Supine to Sit     Supine to sit: Min assist;HOB elevated     General bed mobility comments: Pt required increased time and minimum assistance to bring trunk to sitting position.  Transfers Overall transfer level: Needs assistance Equipment used: Rolling walker (2 wheeled) Transfers: Sit to/from  Stand Sit to Stand: Min guard         General transfer comment: Required VC's for safe hand placement with RW during transfers.    Balance Overall balance assessment: Needs assistance Sitting-balance support: No upper extremity supported;Feet supported Sitting balance-Leahy Scale: Fair     Standing balance support: Single extremity supported;During functional activity Standing balance-Leahy Scale: Poor                     ADL Overall ADL's : Needs assistance/impaired     Grooming: Wash/dry hands;Min guard;Standing                       Toileting- Clothing Manipulation and Hygiene: Bed level;Set up;Supervision/safety Toileting - Clothing Manipulation Details (indicate cue type and reason): Pt performed toileting hygeine from bed level following use of bed pan.       General ADL Comments: Education concerning energy conservation during ADLs; requires further reinforcement.      Vision                     Perception     Praxis      Cognition   Behavior During Therapy: [facility] for tasks assessed/performed Overall Cognitive Status: Within Functional Limits for tasks assessed                       Extremity/Trunk Assessment               Exercises     Shoulder Instructions       General Comments      Pertinent Vitals/ Pain  Pain Assessment: 0-10 Pain Score: 6  Pain Location: R LE Pain Descriptors / Indicators: Sore;Aching;Discomfort Pain Intervention(s): Monitored during session;Repositioned  Home Living                                          Prior Functioning/Environment              Frequency  Min 2X/week        Progress Toward Goals  OT Goals(current goals can now be found in the care plan section)  Progress towards OT goals: Progressing toward goals  Acute Rehab OT Goals Patient Stated Goal: to get better and go home OT Goal Formulation: With patient Time For Goal  Achievement: 09/10/16 Potential to Achieve Goals: Good ADL Goals Pt Will Perform Upper Body Bathing: with min guard assist;sitting Pt Will Perform Upper Body Dressing: with min guard assist;sitting Pt Will Perform Tub/Shower Transfer: with min guard assist Additional ADL Goal #1: Pt will verbalize and/or demonstrate energy conservation techniques during UB dressing while seated at EOB.  Plan Discharge plan remains appropriate    Co-evaluation                 End of Session Equipment Utilized During Treatment: Rolling walker;Gait belt   Activity Tolerance Patient tolerated treatment well   Patient Left in chair;with call bell/phone within reach   Nurse Communication          Time: [id number] OT Time Calculation (min): 39 min  Charges: OT General Charges $OT Visit: 1 Procedure OT Treatments $Self Care/Home Management : 38-52 mins  Norman Herrlich, OTR/L [phone] 08/29/2016, 11:51 AM

## 2016-08-29 NOTE — Consult Note (Signed)
[facility] CM Primary Care Navigator  08/29/2016  Amanda Horton 04-Mar-1946 [id number]  Met with patient at the bedside to identify possible discharge needs.  Patient states she loss balance, fell in the yard and scraped her right leg on the concrete that led to this admission.  Patient endorses Dr. Nelda Bucks as her primary care provider at [facility].    Patient shared using [place] and [facility] in [place] to obtain medications without difficulty. She manages her own medications weekly using pill box system.   Her son [name]) provides transportation to doctors' appointments and he is the primary caregiver at home since he currently lives with patient, as stated. Discussed with patient her [employer] transportation benefits as well.  Patient voiced understanding to call primary care provider's office for a post discharge follow-up appointment within a week or sooner if needs arise. Patient letter provided as a reminder.   Patient had refused referral to [facility] care management for disease management/ education (COPD) and resources at this time.         For additional questions please contact:  Edwena Felty A. Bode Pieper, BSN, RN-BC [facility] PRIMARY CARE Navigator Cell: [phone]

## 2016-08-29 NOTE — Plan of Care (Signed)
Problem: Physical Regulation: Goal: Ability to maintain clinical measurements within normal limits will improve Outcome: Progressing Meeting goals

## 2016-08-29 NOTE — Consult Note (Signed)
Reason for Consult: RIght leg wound Referring Physician: Dr. Bonnielee Haff  Amanda Horton is an 70 y.o. female.  HPI: The patient is a 70 yrs old wf here for treatment after a fall at home.  She lost her balance and fell.  Her son tried to catch her.  She sustained a degloving injury to the majority of the right anterior leg.  She has multiple medical conditions including COPD and heart disease. She states pain in the leg.  There are no known fractures from the fall.  The wound is to fat with loss of most of the skin over the 20 x 25 cm wound.  The leg is red and markedly swollen.  Past Medical History:  Diagnosis Date  . Acute hyperkalemia   . Acute hypokalemia   . Acute on chronic renal failure ([place])   . Allergic rhinitis   . Anemia due to chronic kidney disease   . APC (atrial premature contractions)   . Arthritis of lumbar spine   . Benign essential hypertension   . Borderline diabetes   . Chronic insomnia   . Chronic venous insufficiency   . COPD (chronic obstructive pulmonary disease) ([place])   . GERD (gastroesophageal reflux disease)   . Hyperlipemia   . Hypoxia   . Lumbar disc narrowing   . OSA (obstructive sleep apnea)   . Osteoporosis   . Respiratory failure requiring intubation ([place])   . Spinal stenosis of lumbar region without neurogenic claudication   . Varicose veins with pain   . Vitamin B12 deficiency   . Vitamin D deficiency     Past Surgical History:  Procedure Laterality Date  . PARTIAL HYSTERECTOMY      Family History  Problem Relation Age of Onset  . Emphysema Father   . Asthma Father   . Throat cancer Mother     Social History:  reports that she quit smoking about 3 years ago. Her smoking use included Cigarettes. She has a 55.00 pack-year smoking history. She has never used smokeless tobacco. She reports that she does not drink alcohol or use drugs.  Allergies: No Known Allergies  Medications: I have reviewed the patient's current  medications.  Results for orders placed or performed during the hospital encounter of 08/26/16 (from the past 48 hour(s))  Hemoglobin A1c     Status: Abnormal   Collection Time: 08/27/16  9:16 AM  Result Value Ref Range   Hgb A1c MFr Bld 5.8 (H) 4.8 - 5.6 %    Comment: (NOTE)         Pre-diabetes: 5.7 - 6.4         Diabetes: >6.4         Glycemic control for adults with diabetes: <7.0    Mean Plasma Glucose 120 mg/dL    Comment: (NOTE) Performed At: [facility] [address] [place], [place] [id number] Lindon Romp MD WU:[id number]   Glucose, capillary     Status: Abnormal   Collection Time: 08/27/16 11:26 AM  Result Value Ref Range   Glucose-Capillary 129 (H) 65 - 99 mg/dL   Comment 1 Notify RN   Glucose, capillary     Status: Abnormal   Collection Time: 08/27/16  4:28 PM  Result Value Ref Range   Glucose-Capillary 122 (H) 65 - 99 mg/dL   Comment 1 Notify RN   Glucose, capillary     Status: Abnormal   Collection Time: 08/27/16 11:22 PM  Result Value Ref Range   Glucose-Capillary 122 (  H) 65 - 99 mg/dL  Basic metabolic panel     Status: Abnormal   Collection Time: 08/28/16  5:13 AM  Result Value Ref Range   Sodium 139 135 - 145 mmol/L   Potassium 3.8 3.5 - 5.1 mmol/L   Chloride 95 (L) 101 - 111 mmol/L   CO2 38 (H) 22 - 32 mmol/L   Glucose, Bld 134 (H) 65 - 99 mg/dL   BUN 11 6 - 20 mg/dL   Creatinine, Ser 0.91 0.44 - 1.00 mg/dL   Calcium 8.4 (L) 8.9 - 10.3 mg/dL   GFR calc non Af Amer >60 >60 mL/min   GFR calc Af Amer >60 >60 mL/min    Comment: (NOTE) The eGFR has been calculated using the CKD EPI equation. This calculation has not been validated in all clinical situations. eGFR's persistently <60 mL/min signify possible Chronic Kidney Disease.    Anion gap 6 5 - 15  CBC     Status: Abnormal   Collection Time: 08/28/16  5:13 AM  Result Value Ref Range   WBC 7.0 4.0 - 10.5 K/uL   RBC 3.46 (L) 3.87 - 5.11 MIL/uL   Hemoglobin 9.5 (L) 12.0 - 15.0 g/dL    HCT 32.6 (L) 36.0 - 46.0 %   MCV 94.2 78.0 - 100.0 fL   MCH 27.5 26.0 - 34.0 pg   MCHC 29.1 (L) 30.0 - 36.0 g/dL   RDW 13.4 11.5 - 15.5 %   Platelets 158 150 - 400 K/uL  Glucose, capillary     Status: Abnormal   Collection Time: 08/28/16  7:06 AM  Result Value Ref Range   Glucose-Capillary 128 (H) 65 - 99 mg/dL  Glucose, capillary     Status: Abnormal   Collection Time: 08/28/16 11:47 AM  Result Value Ref Range   Glucose-Capillary 132 (H) 65 - 99 mg/dL  Glucose, capillary     Status: Abnormal   Collection Time: 08/28/16  4:48 PM  Result Value Ref Range   Glucose-Capillary 110 (H) 65 - 99 mg/dL  Glucose, capillary     Status: Abnormal   Collection Time: 08/28/16 11:12 PM  Result Value Ref Range   Glucose-Capillary 130 (H) 65 - 99 mg/dL  CBC     Status: Abnormal   Collection Time: 08/29/16  4:05 AM  Result Value Ref Range   WBC 6.9 4.0 - 10.5 K/uL   RBC 3.39 (L) 3.87 - 5.11 MIL/uL   Hemoglobin 9.4 (L) 12.0 - 15.0 g/dL   HCT 32.1 (L) 36.0 - 46.0 %   MCV 94.7 78.0 - 100.0 fL   MCH 27.7 26.0 - 34.0 pg   MCHC 29.3 (L) 30.0 - 36.0 g/dL   RDW 13.4 11.5 - 15.5 %   Platelets 164 150 - 400 K/uL  Basic metabolic panel     Status: Abnormal   Collection Time: 08/29/16  4:05 AM  Result Value Ref Range   Sodium 142 135 - 145 mmol/L   Potassium 3.6 3.5 - 5.1 mmol/L   Chloride 91 (L) 101 - 111 mmol/L   CO2 44 (H) 22 - 32 mmol/L   Glucose, Bld 121 (H) 65 - 99 mg/dL   BUN 7 6 - 20 mg/dL   Creatinine, Ser 1.01 (H) 0.44 - 1.00 mg/dL   Calcium 8.4 (L) 8.9 - 10.3 mg/dL   GFR calc non Af Amer 55 (L) >60 mL/min   GFR calc Af Amer >60 >60 mL/min    Comment: (NOTE) The eGFR has been  calculated using the CKD EPI equation. This calculation has not been validated in all clinical situations. eGFR's persistently <60 mL/min signify possible Chronic Kidney Disease.    Anion gap 7 5 - 15  Vitamin B12     Status: None   Collection Time: 08/29/16  4:05 AM  Result Value Ref Range   Vitamin B-12  844 180 - 914 pg/mL    Comment: (NOTE) This assay is not validated for testing neonatal or myeloproliferative syndrome specimens for Vitamin B12 levels.   Folate     Status: None   Collection Time: 08/29/16  4:05 AM  Result Value Ref Range   Folate 15.9 >5.9 ng/mL  Iron and TIBC     Status: Abnormal   Collection Time: 08/29/16  4:05 AM  Result Value Ref Range   Iron 18 (L) 28 - 170 ug/dL   TIBC 315 250 - 450 ug/dL   Saturation Ratios 6 (L) 10.4 - 31.8 %   UIBC 297 ug/dL  Ferritin     Status: None   Collection Time: 08/29/16  4:05 AM  Result Value Ref Range   Ferritin 66 11 - 307 ng/mL  Reticulocytes     Status: Abnormal   Collection Time: 08/29/16  4:05 AM  Result Value Ref Range   Retic Ct Pct 1.7 0.4 - 3.1 %   RBC. 3.39 (L) 3.87 - 5.11 MIL/uL   Retic Count, Manual 57.6 19.0 - 186.0 K/uL  Glucose, capillary     Status: Abnormal   Collection Time: 08/29/16  6:16 AM  Result Value Ref Range   Glucose-Capillary 120 (H) 65 - 99 mg/dL    Dg Chest Port 1 View  Result Date: 08/27/2016 CLINICAL DATA:  70 year old female with a history of COPD EXAM: PORTABLE CHEST 1 VIEW COMPARISON:  None. FINDINGS: Cardiomediastinal silhouette enlarged. Calcifications of the aortic arch. Mixed airspace disease at the bilateral lung bases partially obscuring the heart borders. Right costophrenic angle is excluded. No pneumothorax. Questionable interlobular septal thickening. IMPRESSION: Cardiomegaly with questionable interstitial edema. Opacities at the lung bases may reflect low lung volumes with atelectasis or consolidation. Correlation with lab values may be useful. Aortic atherosclerosis. Signed, Dulcy Fanny. Earleen Newport, DO [facility] Radiology Electronically Signed   By: Corrie Mckusick D.O.   On: 08/27/2016 16:54    Review of Systems  Constitutional: Negative.   HENT: Negative.   Eyes: Negative.   Respiratory: Negative.   Cardiovascular: Negative.    Gastrointestinal: Negative.   Genitourinary: Negative.   Musculoskeletal: Negative.   Skin: Negative.   Neurological: Negative.   Psychiatric/Behavioral: Negative.    Blood pressure (!) 133/53, pulse 100, temperature 99 F (37.2 C), temperature source Oral, resp. rate 18, height _0  (1.575 m), weight 122.4 kg (269 lb 14.4 oz), SpO2 96 %. Physical Exam  Constitutional: She is oriented to person, place, and time. She appears well-developed and well-nourished.  HENT:  Head: Normocephalic and atraumatic.  Eyes: EOM are normal. Pupils are equal, round, and reactive to light.  Respiratory: Effort normal.  GI: Soft.  Musculoskeletal: She exhibits edema and tenderness.       Legs: Neurological: She is alert and oriented to person, place, and time.  Skin: Skin is warm. There is erythema.    Assessment/Plan: Plan for debridement of right leg with Acell and VAC placement.  Needs to keep her leg elevated as much as possible.  Nutrition consult.  Multivitamin daily, Vit C 500 mg twice daily and protein  drinks. Zinc 220 mg daily.  Wallace Going 08/29/2016, 8:48 AM

## 2016-08-30 ENCOUNTER — Encounter (HOSPITAL_COMMUNITY): Payer: Self-pay | Admitting: Plastic Surgery

## 2016-08-30 ENCOUNTER — Encounter (HOSPITAL_COMMUNITY)
Admission: EM | Disposition: A | Discharge status: Skilled Nursing Facility | Payer: Self-pay | Source: Home / Self Care | Attending: Internal Medicine

## 2016-08-30 ENCOUNTER — Inpatient Hospital Stay (HOSPITAL_COMMUNITY): Payer: Commercial Managed Care - HMO | Admitting: Anesthesiology

## 2016-08-30 DIAGNOSIS — R7303 Prediabetes: Secondary | ICD-10-CM

## 2016-08-30 DIAGNOSIS — K219 Gastro-esophageal reflux disease without esophagitis: Secondary | ICD-10-CM

## 2016-08-30 HISTORY — PX: APPLICATION OF WOUND VAC: SHX5189

## 2016-08-30 HISTORY — PX: I & D EXTREMITY: SHX5045

## 2016-08-30 HISTORY — PX: APPLICATION OF A-CELL OF EXTREMITY: SHX6303

## 2016-08-30 LAB — BASIC METABOLIC PANEL
Anion gap: 7 (ref 5–15)
BUN: 7 mg/dL (ref 6–20)
CALCIUM: 8.3 mg/dL — AB (ref 8.9–10.3)
CO2: 41 mmol/L — ABNORMAL HIGH (ref 22–32)
CREATININE: 0.99 mg/dL (ref 0.44–1.00)
Chloride: 91 mmol/L — ABNORMAL LOW (ref 101–111)
GFR calc non Af Amer: 56 mL/min — ABNORMAL LOW (ref >60)
Glucose, Bld: 114 mg/dL — ABNORMAL HIGH (ref 65–99)
Potassium: 3.5 mmol/L (ref 3.5–5.1)
SODIUM: 139 mmol/L (ref 135–145)

## 2016-08-30 LAB — GLUCOSE, CAPILLARY
Glucose-Capillary: 109 mg/dL — ABNORMAL HIGH (ref 65–99)
Glucose-Capillary: 116 mg/dL — ABNORMAL HIGH (ref 65–99)
Glucose-Capillary: 126 mg/dL — ABNORMAL HIGH (ref 65–99)
Glucose-Capillary: 124 mg/dL — ABNORMAL HIGH (ref 65–99)

## 2016-08-30 LAB — CBC
HCT: 30.7 % — ABNORMAL LOW (ref 36.0–46.0)
HEMOGLOBIN: 9.3 g/dL — AB (ref 12.0–15.0)
MCH: 28.3 pg (ref 26.0–34.0)
MCHC: 30.3 g/dL (ref 30.0–36.0)
MCV: 93.3 fL (ref 78.0–100.0)
Platelets: 181 10*3/uL (ref 150–400)
RBC: 3.29 MIL/uL — AB (ref 3.87–5.11)
RDW: 13.2 % (ref 11.5–15.5)
WBC: 5.9 10*3/uL (ref 4.0–10.5)

## 2016-08-30 LAB — SURGICAL PCR SCREEN
MRSA, PCR: NEGATIVE
STAPHYLOCOCCUS AUREUS: NEGATIVE

## 2016-08-30 SURGERY — IRRIGATION AND DEBRIDEMENT EXTREMITY
Anesthesia: General | Site: Leg Lower | Laterality: Right

## 2016-08-30 MED ORDER — MIDAZOLAM HCL 5 MG/5ML IJ SOLN
INTRAMUSCULAR | Status: DC | PRN
  Administered 2016-08-30 (×2): 1 mg via INTRAVENOUS

## 2016-08-30 MED ORDER — FENTANYL CITRATE (PF) 100 MCG/2ML IJ SOLN
INTRAMUSCULAR | Status: AC
  Filled 2016-08-30: qty 4

## 2016-08-30 MED ORDER — LACTATED RINGERS IV SOLN
INTRAVENOUS | Status: DC | PRN
  Administered 2016-08-30: 14:00:00 via INTRAVENOUS

## 2016-08-30 MED ORDER — MAGNESIUM SULFATE 2 GM/50ML IV SOLN
2.0000 g | Freq: Once | INTRAVENOUS | Status: DC
  Filled 2016-08-30: qty 50

## 2016-08-30 MED ORDER — SUGAMMADEX SODIUM 200 MG/2ML IV SOLN
INTRAVENOUS | Status: DC | PRN
  Administered 2016-08-30: 500 mg via INTRAVENOUS

## 2016-08-30 MED ORDER — FENTANYL CITRATE (PF) 100 MCG/2ML IJ SOLN
INTRAMUSCULAR | Status: DC | PRN
  Administered 2016-08-30 (×4): 50 ug via INTRAVENOUS

## 2016-08-30 MED ORDER — CHLORHEXIDINE GLUCONATE CLOTH 2 % EX PADS
6.0000 | MEDICATED_PAD | Freq: Once | CUTANEOUS | Status: AC
  Administered 2016-08-30: 6 via TOPICAL

## 2016-08-30 MED ORDER — ROCURONIUM BROMIDE 100 MG/10ML IV SOLN
INTRAVENOUS | Status: DC | PRN
  Administered 2016-08-30: 50 mg via INTRAVENOUS

## 2016-08-30 MED ORDER — SODIUM CHLORIDE 0.9 % IR SOLN
Status: DC | PRN
  Administered 2016-08-30 (×2): 1000 mL

## 2016-08-30 MED ORDER — IPRATROPIUM-ALBUTEROL 0.5-2.5 (3) MG/3ML IN SOLN
RESPIRATORY_TRACT | Status: AC
  Administered 2016-08-30: 3 mL via RESPIRATORY_TRACT
  Filled 2016-08-30: qty 3

## 2016-08-30 MED ORDER — POTASSIUM CHLORIDE CRYS ER 20 MEQ PO TBCR
40.0000 meq | EXTENDED_RELEASE_TABLET | Freq: Two times a day (BID) | ORAL | Status: DC
  Administered 2016-08-30 – 2016-09-01 (×4): 40 meq via ORAL
  Filled 2016-08-30 (×4): qty 2

## 2016-08-30 MED ORDER — PROPOFOL 10 MG/ML IV BOLUS
INTRAVENOUS | Status: AC
Start: 2016-08-30 — End: 2016-08-30
  Filled 2016-08-30: qty 20

## 2016-08-30 MED ORDER — ONDANSETRON HCL 4 MG/2ML IJ SOLN
INTRAMUSCULAR | Status: DC | PRN
  Administered 2016-08-30: 4 mg via INTRAVENOUS

## 2016-08-30 MED ORDER — LIDOCAINE HCL (CARDIAC) 20 MG/ML IV SOLN
INTRAVENOUS | Status: DC | PRN
  Administered 2016-08-30: 100 mg via INTRAVENOUS

## 2016-08-30 MED ORDER — FENTANYL CITRATE (PF) 100 MCG/2ML IJ SOLN: 25.0000 ug | INTRAMUSCULAR | Status: DC | PRN

## 2016-08-30 MED ORDER — SUGAMMADEX SODIUM 500 MG/5ML IV SOLN
INTRAVENOUS | Status: AC
Start: 2016-08-30 — End: 2016-08-30
  Filled 2016-08-30: qty 5

## 2016-08-30 MED ORDER — PROMETHAZINE HCL 25 MG/ML IJ SOLN: 6.2500 mg | INTRAMUSCULAR | Status: DC | PRN

## 2016-08-30 MED ORDER — SODIUM CHLORIDE 0.9 % IR SOLN
Status: DC | PRN
  Administered 2016-08-30: 500 mL

## 2016-08-30 MED ORDER — ALBUTEROL SULFATE (2.5 MG/3ML) 0.083% IN NEBU
INHALATION_SOLUTION | RESPIRATORY_TRACT | Status: AC
  Filled 2016-08-30: qty 3

## 2016-08-30 MED ORDER — ALBUTEROL SULFATE HFA 108 (90 BASE) MCG/ACT IN AERS
INHALATION_SPRAY | RESPIRATORY_TRACT | Status: DC | PRN
  Administered 2016-08-30: 6 via RESPIRATORY_TRACT

## 2016-08-30 MED ORDER — MIDAZOLAM HCL 2 MG/2ML IJ SOLN
INTRAMUSCULAR | Status: AC
  Filled 2016-08-30: qty 2

## 2016-08-30 MED ORDER — CEFAZOLIN SODIUM-DEXTROSE 2-4 GM/100ML-% IV SOLN
2.0000 g | INTRAVENOUS | Status: DC
  Filled 2016-08-30: qty 100

## 2016-08-30 MED ORDER — CHLORHEXIDINE GLUCONATE CLOTH 2 % EX PADS: 6.0000 | MEDICATED_PAD | Freq: Once | CUTANEOUS | Status: AC

## 2016-08-30 MED ORDER — ORAL CARE MOUTH RINSE
15.0000 mL | Freq: Two times a day (BID) | OROMUCOSAL | Status: DC
  Administered 2016-08-31 – 2016-09-01 (×2): 15 mL via OROMUCOSAL

## 2016-08-30 MED ORDER — METOPROLOL TARTRATE 5 MG/5ML IV SOLN
INTRAVENOUS | Status: AC
  Filled 2016-08-30: qty 5

## 2016-08-30 MED ORDER — PROPOFOL 10 MG/ML IV BOLUS
INTRAVENOUS | Status: DC | PRN
Start: 2016-08-30 — End: 2016-08-30
  Administered 2016-08-30: 200 mg via INTRAVENOUS

## 2016-08-30 MED ORDER — PHENYLEPHRINE HCL 10 MG/ML IJ SOLN
INTRAMUSCULAR | Status: DC | PRN
  Administered 2016-08-30: 80 ug via INTRAVENOUS

## 2016-08-30 MED ORDER — LACTATED RINGERS IV SOLN
Freq: Once | INTRAVENOUS | Status: AC
  Administered 2016-08-30: 12:00:00 via INTRAVENOUS

## 2016-08-30 MED ORDER — METOPROLOL TARTRATE 5 MG/5ML IV SOLN
2.5000 mg | Freq: Once | INTRAVENOUS | Status: AC
  Administered 2016-08-30: 2.5 mg via INTRAVENOUS

## 2016-08-30 SURGICAL SUPPLY — 47 items
BANDAGE ACE 4X5 VEL STRL LF (GAUZE/BANDAGES/DRESSINGS) ×4 IMPLANT
BLADE SURG ROTATE 9660 (MISCELLANEOUS) IMPLANT
BNDG GAUZE ELAST 4 BULKY (GAUZE/BANDAGES/DRESSINGS) ×4 IMPLANT
CANISTER SUCTION 2500CC (MISCELLANEOUS) ×3 IMPLANT
CANISTER WOUND CARE 500ML ATS (WOUND CARE) ×2 IMPLANT
CHLORAPREP W/TINT 26ML (MISCELLANEOUS) IMPLANT
COVER SURGICAL LIGHT HANDLE (MISCELLANEOUS) ×3 IMPLANT
DRAPE EXTREMITY T 121X128X90 (DRAPE) IMPLANT
DRAPE INCISE IOBAN 66X45 STRL (DRAPES) ×2 IMPLANT
DRAPE ORTHO SPLIT 77X108 STRL (DRAPES)
DRAPE SURG ORHT 6 SPLT 77X108 (DRAPES) IMPLANT
DRESSING HYDROCOLLOID 4X4 (GAUZE/BANDAGES/DRESSINGS) ×5 IMPLANT
DRSG ADAPTIC 3X8 NADH LF (GAUZE/BANDAGES/DRESSINGS) IMPLANT
DRSG CUTIMED SORBACT 7X9 (GAUZE/BANDAGES/DRESSINGS) ×2 IMPLANT
DRSG PAD ABDOMINAL 8X10 ST (GAUZE/BANDAGES/DRESSINGS) IMPLANT
DRSG VAC ATS LRG SENSATRAC (GAUZE/BANDAGES/DRESSINGS) ×2 IMPLANT
DRSG VAC ATS MED SENSATRAC (GAUZE/BANDAGES/DRESSINGS) IMPLANT
DRSG VAC ATS SM SENSATRAC (GAUZE/BANDAGES/DRESSINGS) IMPLANT
ELECT REM PT RETURN 9FT ADLT (ELECTROSURGICAL) ×3
ELECTRODE REM PT RTRN 9FT ADLT (ELECTROSURGICAL) ×1 IMPLANT
GAUZE SPONGE 4X4 12PLY STRL (GAUZE/BANDAGES/DRESSINGS) IMPLANT
GEL ULTRASOUND 20GR AQUASONIC (MISCELLANEOUS) IMPLANT
GLOVE BIO SURGEON STRL SZ 6.5 (GLOVE) ×5 IMPLANT
GLOVE BIO SURGEONS STRL SZ 6.5 (GLOVE) ×3
GLOVE BIOGEL PI IND STRL 8.5 (GLOVE) IMPLANT
GLOVE BIOGEL PI INDICATOR 8.5 (GLOVE) ×2
GLOVE SURG SS PI 8.0 STRL IVOR (GLOVE) ×2 IMPLANT
GOWN STRL REUS W/ TWL LRG LVL3 (GOWN DISPOSABLE) ×2 IMPLANT
GOWN STRL REUS W/TWL LRG LVL3 (GOWN DISPOSABLE) ×6
HANDPIECE INTERPULSE COAX TIP (DISPOSABLE)
KIT BASIN OR (CUSTOM PROCEDURE TRAY) ×3 IMPLANT
KIT ROOM TURNOVER OR (KITS) ×3 IMPLANT
MATRIX SURGICAL PSM 10X15CM (Tissue) ×4 IMPLANT
MICROMATRIX 1000MG (Tissue) ×12 IMPLANT
NS IRRIG 1000ML POUR BTL (IV SOLUTION) ×5 IMPLANT
PACK GENERAL/GYN (CUSTOM PROCEDURE TRAY) ×3 IMPLANT
PAD ABD 8X10 STRL (GAUZE/BANDAGES/DRESSINGS) ×2 IMPLANT
PAD ARMBOARD 7.5X6 YLW CONV (MISCELLANEOUS) ×6 IMPLANT
PAD NEG PRESSURE SENSATRAC (MISCELLANEOUS) IMPLANT
SET HNDPC FAN SPRY TIP SCT (DISPOSABLE) IMPLANT
SOLUTION PARTIC MCRMTRX 1000MG (Tissue) IMPLANT
STAPLER VISISTAT 35W (STAPLE) ×2 IMPLANT
SUT SILK 4 0 PS 2 (SUTURE) IMPLANT
SUT VIC AB 5-0 PS2 18 (SUTURE) ×14 IMPLANT
TOWEL OR 17X24 6PK STRL BLUE (TOWEL DISPOSABLE) IMPLANT
TOWEL OR 17X26 10 PK STRL BLUE (TOWEL DISPOSABLE) ×3 IMPLANT
UNDERPAD 30X30 (UNDERPADS AND DIAPERS) IMPLANT

## 2016-08-30 NOTE — Progress Notes (Signed)
Set pt up on Cpap 9.0 cmh20 with 3 lpm bled in 02.  Tolerating well will continue to monitor.

## 2016-08-30 NOTE — Anesthesia Preprocedure Evaluation (Addendum)
Anesthesia Evaluation  Patient identified by MRN, date of birth, ID band Patient awake    Reviewed: Allergy & Precautions, NPO status , Patient's Chart, lab work & pertinent test results  Airway Mallampati: II  TM Distance: >3 FB Neck ROM: Full    Dental no notable dental hx.    Pulmonary neg pulmonary ROS, sleep apnea and Continuous Positive Airway Pressure Ventilation , COPD,  COPD inhaler, former smoker (quit 3 years ago),    Pulmonary exam normal        Cardiovascular hypertension, Pt. on medications and Pt. on home beta blockers + Peripheral Vascular Disease  Normal cardiovascular exam     Neuro/Psych PSYCHIATRIC DISORDERS Depression negative neurological ROS     GI/Hepatic Neg liver ROS, GERD  Medicated,  Endo/Other  Morbid obesity  Renal/GU negative Renal ROS     Musculoskeletal negative musculoskeletal ROS (+) Arthritis , Osteoarthritis,    Abdominal   Peds  Hematology  (+) Blood dyscrasia, anemia ,   Anesthesia Other Findings   Reproductive/Obstetrics                             Anesthesia Physical Anesthesia Plan  ASA: III  Anesthesia Plan: General   Post-op Pain Management:    Induction: Intravenous  Airway Management Planned: Oral ETT  Additional Equipment:   Intra-op Plan:   Post-operative Plan: Extubation in OR  Informed Consent: I have reviewed the patients History and Physical, chart, labs and discussed the procedure including the risks, benefits and alternatives for the proposed anesthesia with the patient or authorized representative who has indicated his/her understanding and acceptance.   Dental advisory given  Plan Discussed with: CRNA  Anesthesia Plan Comments:        Anesthesia Quick Evaluation

## 2016-08-30 NOTE — Op Note (Signed)
Operative Note   DATE OF OPERATION: 08/30/2016  LOCATION: [facility] Inpatient  SURGICAL DIVISION: Plastic Surgery  PREOPERATIVE DIAGNOSES:  Right leg wound  POSTOPERATIVE DIAGNOSES:  same  PROCEDURE:   1.  Excisional debridement of right leg wound 14 x 30 x 1.5 cm skin and fat 2.  Placement of Acell (powder 3 gm and 10 x 15 cm x 2 sheet) 3.  Placement of VAC  SURGEON: Jobany Montellano [employer], DO  ASSISTANT: Shawn Rayburn, PA  ANESTHESIA:  General.   COMPLICATIONS: None.   INDICATIONS FOR PROCEDURE:  The patient, Amanda Horton is a 70 y.o. female born on November 21, 1946, is here for treatment of a large right leg wound after a fall 4 days ago.   MRN: [id number]  CONSENT:  Informed consent was obtained directly from the patient. Risks, benefits and alternatives were fully discussed. Specific risks including but not limited to bleeding, infection, hematoma, seroma, scarring, pain, infection, contracture, asymmetry, wound healing problems, and need for further surgery were all discussed. The patient did have an ample opportunity to have questions answered to satisfaction.   DESCRIPTION OF PROCEDURE:  The patient was taken to the operating room. SCD placed of left leg and IV antibiotics were given. The patient's operative site was prepped and draped in a sterile fashion. A time out was performed and all information was confirmed to be correct.  General anesthesia was administered.  The leg was irrigated with saline and antibiotic solution.  There was a large area of skin flap and fat that was nonviable.  This was debrided with a pair of scissors for 14 x 30 cm wound.  Hemostasis was achieved with electrocautery.  All of the Acell powder and sheet was used.  The sheet was secured with 5-0 Vicryl.  The sorbact was placed and secured with staples.  The VAC was applied and there was an excellent seal.  The leg was wrapped with kerlex and an ace wrap.  The patient tolerated the procedure  well.  There were no complications. The patient was allowed to wake from anesthesia, extubated and taken to the recovery room in satisfactory condition.

## 2016-08-30 NOTE — Progress Notes (Signed)
RT called to PACU for pt needing ordered QHS cpap while in pacu. Pt denies SOB, no Increased WOB. Placed pt on cpap per OSA protocol. RN at bedside. RT will continue to monitor.

## 2016-08-30 NOTE — Anesthesia Procedure Notes (Signed)
Procedure Name: Intubation Date/Time: 08/30/2016 2:26 PM Performed by: Ollen Bowl Pre-anesthesia Checklist: Patient identified, Emergency Drugs available, Suction available, Patient being monitored and Timeout performed Oxygen Delivery Method: Circle system utilized and Simple face mask Preoxygenation: Pre-oxygenation with 100% oxygen Intubation Type: IV induction Ventilation: Mask ventilation without difficulty Laryngoscope Size: Miller and 2 Grade View: Grade I Tube type: Oral Tube size: 7.5 mm Number of attempts: 1 Airway Equipment and Method: Patient positioned with wedge pillow and Stylet Placement Confirmation: ETT inserted through vocal cords under direct vision,  positive ETCO2 and breath sounds checked- equal and bilateral Secured at: 22 cm Tube secured with: Tape Dental Injury: Teeth and Oropharynx as per pre-operative assessment

## 2016-08-30 NOTE — Progress Notes (Signed)
Pt is alert and orientated . NPO status effective.  Given IV lasix as ordered and other PO meds were held. Blood sugar was taken at 1110 before leaving the floor it was 116 and no insulin per protocol .   Pt was taken down for surgery. No jewelry or false teeth.   Saline locked.   Called short stay to give report.   PT was taken off of telemetry.    Paulla Fore, RN

## 2016-08-30 NOTE — Progress Notes (Signed)
PT Cancellation Note  Patient Details Name: Amanda Horton MRN: [id number] DOB: 07/03/1946   Cancelled Treatment:    Reason Eval/Treat Not Completed: Patient at procedure or test/unavailable.  Pt to OR.  Will f/u tomorrow.     Voncile Schwarz, Thornton Papas 08/30/2016, 12:21 PM

## 2016-08-30 NOTE — Interval H&P Note (Signed)
History and Physical Interval Note:  08/30/2016 11:48 AM  Amanda Horton  has presented today for surgery, with the diagnosis of Wound Right LE  The various methods of treatment have been discussed with the patient and family. After consideration of risks, benefits and other options for treatment, the patient has consented to  Procedure(s): IRRIGATION AND DEBRIDEMENT EXTREMITY RIGHT W/ A-CELL AND VAC PLACEMENT (Right) as a surgical intervention .  The patient's history has been reviewed, patient examined, no change in status, stable for surgery.  I have reviewed the patient's chart and labs.  Questions were answered to the patient's satisfaction.     Wallace Going

## 2016-08-30 NOTE — Anesthesia Postprocedure Evaluation (Signed)
Anesthesia Post Note  Patient: Amanda Horton  Procedure(s) Performed: Procedure(s) (LRB): IRRIGATION AND DEBRIDEMENT EXTREMITY RIGHT (Right) APPLICATION OF WOUND VAC (Right) APPLICATION OF A-CELL OF EXTREMITY (Right)  Patient location during evaluation: PACU Anesthesia Type: General Level of consciousness: awake and alert Pain management: pain level controlled Vital Signs Assessment: post-procedure vital signs reviewed and stable Respiratory status: spontaneous breathing, nonlabored ventilation, respiratory function stable and patient connected to nasal cannula oxygen Cardiovascular status: blood pressure returned to baseline and stable Postop Assessment: no signs of nausea or vomiting Anesthetic complications: no    Last Vitals:  Vitals:   08/30/16 1606 08/30/16 1615  BP: (!) 161/78   Pulse: (!) 104 (!) 106  Resp: 19 18  Temp:      Last Pain:  Vitals:   08/30/16 1545  TempSrc:   PainSc: 0-No pain                 Reginal Lutes

## 2016-08-30 NOTE — Progress Notes (Signed)
[facility] PROGRESS NOTE  Amanda Horton [id number] DOB: 1946-06-06 DOA: 08/26/2016  PCP: Nicoletta Dress, MD  Brief History/Interval Summary: Amanda Horton is a 70 y.o. female with a medical history significant for COPD on home 02. Patient presented to ED after a fall at home. She fell while trying to walk up a step and sustained significant injury to right lower leg. Fall was mechanical without any preceding dizziness or weakness. There was avulsion injury to the skin which was sutured by the EDP.  Reason for Visit: RLE skin avulsion  Consultants: Plastics  Procedures: None yet.  Antibiotics: None  Subjective/Interval History: Seen with her son at bedside, denies any new complaints. Anxious a little bit about her surgery but questions answered.  ROS: Denies nausea, vomiting.  Objective:  Vital Signs  Vitals:   08/29/16 2056 08/30/16 0036 08/30/16 0423 08/30/16 0808  BP: (!) 110/57  138/64   Pulse: 93 98 96   Resp: 16  17   Temp: 99.2 F (37.3 C)  98.4 F (36.9 C)   TempSrc: Oral  Oral   SpO2: 95% 96% 97% 97%  Weight:      Height:        Intake/Output Summary (Last 24 hours) at 08/30/16 1123 Last data filed at 08/29/16 2301  Gross per 24 hour  Intake              830 ml  Output                0 ml  Net              830 ml   Filed Weights   08/26/16 2319 08/27/16 0639  Weight: 113.4 kg (250 lb) 122.4 kg (269 lb 14.4 oz)    General appearance: alert, cooperative, appears stated age and no distress Resp: Improved air entry bilaterally. Less wheezing today. Fewer crackles.  Cardio: regular rate and rhythm, S1, S2 normal, no murmur, click, rub or gallop GI: soft, non-tender; bowel sounds normal; no masses,  no organomegaly Extremities: Right leg is covered in a dressing. No drainage is noted. Pulses: 2+ and symmetric Neurologic: Awake and alert. Oriented 3. No focal neurological deficits.  Lab Results:  Data Reviewed: I have personally  reviewed following labs and imaging studies  CBC:  Recent Labs Lab 08/26/16 0004 08/28/16 0513 08/29/16 0405 08/30/16 0532  WBC 7.6 7.0 6.9 5.9  NEUTROABS 5.2  --   --   --   HGB 11.1* 9.5* 9.4* 9.3*  HCT 37.9 32.6* 32.1* 30.7*  MCV 95.2 94.2 94.7 93.3  PLT 188 158 164 0000000    Basic Metabolic Panel:  Recent Labs Lab 08/26/16 0004 08/28/16 0513 08/29/16 0405 08/30/16 0532  NA 140 139 142 139  K 4.0 3.8 3.6 3.5  CL 95* 95* 91* 91*  CO2 39* 38* 44* 41*  GLUCOSE 177* 134* 121* 114*  BUN 20 11 7 7   CREATININE 1.20* 0.91 1.01* 0.99  CALCIUM 8.9 8.4* 8.4* 8.3*    GFR: Estimated Creatinine Clearance: 65.9 mL/min (by C-G formula based on SCr of 0.99 mg/dL).  Coagulation Profile:  Recent Labs Lab 08/26/16 0004  INR 1.00    HbA1C: No results for input(s): HGBA1C in the last 72 hours.  CBG:  Recent Labs Lab 08/29/16 1101 08/29/16 1614 08/29/16 2158 08/30/16 0633 08/30/16 1110  GLUCAP 128* 98 121* 109* 116*    Radiology Studies: No results found.   Medications:  Scheduled: .  ceFAZolin (ANCEF) IV  2 g Intravenous On Call to OR  . escitalopram  10 mg Oral Daily  . furosemide  60 mg Intravenous Q12H  . furosemide  80 mg Oral Once  . guaiFENesin  600 mg Oral BID  . insulin aspart  0-9 Units Subcutaneous TID WC  . mometasone-formoterol  2 puff Inhalation BID  . pantoprazole  40 mg Oral Daily  . potassium chloride SA  20 mEq Oral BID  . vancomycin  1,000 mg Intravenous Q12H   Continuous:  [id number] **OR** acetaminophen, bisacodyl, ipratropium-albuterol, LORazepam, ondansetron **OR** ondansetron (ZOFRAN) IV, oxyCODONE-acetaminophen, polyethylene glycol, temazepam, traZODone  Assessment/Plan:  Active Problems:   Borderline diabetes   Hyperlipemia   GERD (gastroesophageal reflux disease)   COPD (chronic obstructive pulmonary disease) ([facility])   OSA (obstructive sleep apnea)   Degloving injury   Depression   Essential hypertension    Osteoporosis   AKI (acute kidney injury) ([place])   Degloving injury of lower leg   Fall    Mechanical fall with resultant degloving injury RLE No fractures identified on x-ray. EDP spoke with Trauma Surgery who recommended approximation / suturing of flap which was carried out by Dr. Regenia Skeeter. Patient's pain is reasonably well controlled. Wound care has been consulted. Discussed with Dr. Marla Roe with plastic surgery, who has seen the patient this morning.  Plans for surgical intervention today noted. Continue antibiotics for now.  History of COPD on home O2 with possible superimposed pulmonary edema Date entry seems to have improved some. She has fewer crackles today compared to yesterday. Less wheezing as well. Continue Lasix intravenously for one more day and then transitioned to oral tomorrow.  Echocardiogram is pending. Continue oxygen. Hold off on steroids. Strict ins and outs and daily weights.   Mild AKI Creatinine is stable. Continue to monitor closely. Stopped IV fluids.  Normocytic anemia Some drop in hemoglobin as noted. Some of this could be due to bleeding associated with the fall and the right leg injury. Continue to monitor for now. No active bleeding is noted at this time.Anemia panel reviewed.  OSA  Continue CPAP  GERD.  Continue home PPI  History of essential Hypertension Continue home medications. Monitor Blood pressures closely.  Questionable history of DM2, diet controlled.  Patient denies any history of diabetes. HbA1c is 5.8.  Osteoporosis Continue home medications  Depression Continue home anti-depressant.   DVT Prophylaxis: SCDs   Code Status: Partial code  Family Communication: Discussed with the patient. No family at bedside  Disposition Plan: Management as outlined above. Will likely need placement when ready for discharge.    LOS: 2 days   [facility] Pager [phone] 08/30/2016, 11:23 AM  If 7PM-7AM,  please contact night-coverage at [email or web address], password [facility]

## 2016-08-30 NOTE — H&P (View-Only) (Signed)
Reason for Consult: RIght leg wound Referring Physician: Dr. Bonnielee Haff  Amanda Horton is an 70 y.o. female.  HPI: The patient is a 70 yrs old wf here for treatment after a fall at home.  She lost her balance and fell.  Her son tried to catch her.  She sustained a degloving injury to the majority of the right anterior leg.  She has multiple medical conditions including COPD and heart disease. She states pain in the leg.  There are no known fractures from the fall.  The wound is to fat with loss of most of the skin over the 20 x 25 cm wound.  The leg is red and markedly swollen.  Past Medical History:  Diagnosis Date  . Acute hyperkalemia   . Acute hypokalemia   . Acute on chronic renal failure ([place])   . Allergic rhinitis   . Anemia due to chronic kidney disease   . APC (atrial premature contractions)   . Arthritis of lumbar spine   . Benign essential hypertension   . Borderline diabetes   . Chronic insomnia   . Chronic venous insufficiency   . COPD (chronic obstructive pulmonary disease) ([place])   . GERD (gastroesophageal reflux disease)   . Hyperlipemia   . Hypoxia   . Lumbar disc narrowing   . OSA (obstructive sleep apnea)   . Osteoporosis   . Respiratory failure requiring intubation ([place])   . Spinal stenosis of lumbar region without neurogenic claudication   . Varicose veins with pain   . Vitamin B12 deficiency   . Vitamin D deficiency     Past Surgical History:  Procedure Laterality Date  . PARTIAL HYSTERECTOMY      Family History  Problem Relation Age of Onset  . Emphysema Father   . Asthma Father   . Throat cancer Mother     Social History:  reports that she quit smoking about 3 years ago. Her smoking use included Cigarettes. She has a 55.00 pack-year smoking history. She has never used smokeless tobacco. She reports that she does not drink alcohol or use drugs.  Allergies: No Known Allergies  Medications: I have reviewed the patient's current  medications.  Results for orders placed or performed during the hospital encounter of 08/26/16 (from the past 48 hour(s))  Hemoglobin A1c     Status: Abnormal   Collection Time: 08/27/16  9:16 AM  Result Value Ref Range   Hgb A1c MFr Bld 5.8 (H) 4.8 - 5.6 %    Comment: (NOTE)         Pre-diabetes: 5.7 - 6.4         Diabetes: >6.4         Glycemic control for adults with diabetes: <7.0    Mean Plasma Glucose 120 mg/dL    Comment: (NOTE) Performed At: [facility] [address] [place], [place] [id number] Lindon Romp MD WU:[id number]   Glucose, capillary     Status: Abnormal   Collection Time: 08/27/16 11:26 AM  Result Value Ref Range   Glucose-Capillary 129 (H) 65 - 99 mg/dL   Comment 1 Notify RN   Glucose, capillary     Status: Abnormal   Collection Time: 08/27/16  4:28 PM  Result Value Ref Range   Glucose-Capillary 122 (H) 65 - 99 mg/dL   Comment 1 Notify RN   Glucose, capillary     Status: Abnormal   Collection Time: 08/27/16 11:22 PM  Result Value Ref Range   Glucose-Capillary 122 (  H) 65 - 99 mg/dL  Basic metabolic panel     Status: Abnormal   Collection Time: 08/28/16  5:13 AM  Result Value Ref Range   Sodium 139 135 - 145 mmol/L   Potassium 3.8 3.5 - 5.1 mmol/L   Chloride 95 (L) 101 - 111 mmol/L   CO2 38 (H) 22 - 32 mmol/L   Glucose, Bld 134 (H) 65 - 99 mg/dL   BUN 11 6 - 20 mg/dL   Creatinine, Ser 0.91 0.44 - 1.00 mg/dL   Calcium 8.4 (L) 8.9 - 10.3 mg/dL   GFR calc non Af Amer >60 >60 mL/min   GFR calc Af Amer >60 >60 mL/min    Comment: (NOTE) The eGFR has been calculated using the CKD EPI equation. This calculation has not been validated in all clinical situations. eGFR's persistently <60 mL/min signify possible Chronic Kidney Disease.    Anion gap 6 5 - 15  CBC     Status: Abnormal   Collection Time: 08/28/16  5:13 AM  Result Value Ref Range   WBC 7.0 4.0 - 10.5 K/uL   RBC 3.46 (L) 3.87 - 5.11 MIL/uL   Hemoglobin 9.5 (L) 12.0 - 15.0 g/dL    HCT 32.6 (L) 36.0 - 46.0 %   MCV 94.2 78.0 - 100.0 fL   MCH 27.5 26.0 - 34.0 pg   MCHC 29.1 (L) 30.0 - 36.0 g/dL   RDW 13.4 11.5 - 15.5 %   Platelets 158 150 - 400 K/uL  Glucose, capillary     Status: Abnormal   Collection Time: 08/28/16  7:06 AM  Result Value Ref Range   Glucose-Capillary 128 (H) 65 - 99 mg/dL  Glucose, capillary     Status: Abnormal   Collection Time: 08/28/16 11:47 AM  Result Value Ref Range   Glucose-Capillary 132 (H) 65 - 99 mg/dL  Glucose, capillary     Status: Abnormal   Collection Time: 08/28/16  4:48 PM  Result Value Ref Range   Glucose-Capillary 110 (H) 65 - 99 mg/dL  Glucose, capillary     Status: Abnormal   Collection Time: 08/28/16 11:12 PM  Result Value Ref Range   Glucose-Capillary 130 (H) 65 - 99 mg/dL  CBC     Status: Abnormal   Collection Time: 08/29/16  4:05 AM  Result Value Ref Range   WBC 6.9 4.0 - 10.5 K/uL   RBC 3.39 (L) 3.87 - 5.11 MIL/uL   Hemoglobin 9.4 (L) 12.0 - 15.0 g/dL   HCT 32.1 (L) 36.0 - 46.0 %   MCV 94.7 78.0 - 100.0 fL   MCH 27.7 26.0 - 34.0 pg   MCHC 29.3 (L) 30.0 - 36.0 g/dL   RDW 13.4 11.5 - 15.5 %   Platelets 164 150 - 400 K/uL  Basic metabolic panel     Status: Abnormal   Collection Time: 08/29/16  4:05 AM  Result Value Ref Range   Sodium 142 135 - 145 mmol/L   Potassium 3.6 3.5 - 5.1 mmol/L   Chloride 91 (L) 101 - 111 mmol/L   CO2 44 (H) 22 - 32 mmol/L   Glucose, Bld 121 (H) 65 - 99 mg/dL   BUN 7 6 - 20 mg/dL   Creatinine, Ser 1.01 (H) 0.44 - 1.00 mg/dL   Calcium 8.4 (L) 8.9 - 10.3 mg/dL   GFR calc non Af Amer 55 (L) >60 mL/min   GFR calc Af Amer >60 >60 mL/min    Comment: (NOTE) The eGFR has been  calculated using the CKD EPI equation. This calculation has not been validated in all clinical situations. eGFR's persistently <60 mL/min signify possible Chronic Kidney Disease.    Anion gap 7 5 - 15  Vitamin B12     Status: None   Collection Time: 08/29/16  4:05 AM  Result Value Ref Range   Vitamin B-12  844 180 - 914 pg/mL    Comment: (NOTE) This assay is not validated for testing neonatal or myeloproliferative syndrome specimens for Vitamin B12 levels.   Folate     Status: None   Collection Time: 08/29/16  4:05 AM  Result Value Ref Range   Folate 15.9 >5.9 ng/mL  Iron and TIBC     Status: Abnormal   Collection Time: 08/29/16  4:05 AM  Result Value Ref Range   Iron 18 (L) 28 - 170 ug/dL   TIBC 315 250 - 450 ug/dL   Saturation Ratios 6 (L) 10.4 - 31.8 %   UIBC 297 ug/dL  Ferritin     Status: None   Collection Time: 08/29/16  4:05 AM  Result Value Ref Range   Ferritin 66 11 - 307 ng/mL  Reticulocytes     Status: Abnormal   Collection Time: 08/29/16  4:05 AM  Result Value Ref Range   Retic Ct Pct 1.7 0.4 - 3.1 %   RBC. 3.39 (L) 3.87 - 5.11 MIL/uL   Retic Count, Manual 57.6 19.0 - 186.0 K/uL  Glucose, capillary     Status: Abnormal   Collection Time: 08/29/16  6:16 AM  Result Value Ref Range   Glucose-Capillary 120 (H) 65 - 99 mg/dL    Dg Chest Port 1 View  Result Date: 08/27/2016 CLINICAL DATA:  70 year old female with a history of COPD EXAM: PORTABLE CHEST 1 VIEW COMPARISON:  None. FINDINGS: Cardiomediastinal silhouette enlarged. Calcifications of the aortic arch. Mixed airspace disease at the bilateral lung bases partially obscuring the heart borders. Right costophrenic angle is excluded. No pneumothorax. Questionable interlobular septal thickening. IMPRESSION: Cardiomegaly with questionable interstitial edema. Opacities at the lung bases may reflect low lung volumes with atelectasis or consolidation. Correlation with lab values may be useful. Aortic atherosclerosis. Signed, Dulcy Fanny. Earleen Newport, DO [facility] Radiology Electronically Signed   By: Corrie Mckusick D.O.   On: 08/27/2016 16:54    Review of Systems  Constitutional: Negative.   HENT: Negative.   Eyes: Negative.   Respiratory: Negative.   Cardiovascular: Negative.    Gastrointestinal: Negative.   Genitourinary: Negative.   Musculoskeletal: Negative.   Skin: Negative.   Neurological: Negative.   Psychiatric/Behavioral: Negative.    Blood pressure (!) 133/53, pulse 100, temperature 99 F (37.2 C), temperature source Oral, resp. rate 18, height _0  (1.575 m), weight 122.4 kg (269 lb 14.4 oz), SpO2 96 %. Physical Exam  Constitutional: She is oriented to person, place, and time. She appears well-developed and well-nourished.  HENT:  Head: Normocephalic and atraumatic.  Eyes: EOM are normal. Pupils are equal, round, and reactive to light.  Respiratory: Effort normal.  GI: Soft.  Musculoskeletal: She exhibits edema and tenderness.       Legs: Neurological: She is alert and oriented to person, place, and time.  Skin: Skin is warm. There is erythema.    Assessment/Plan: Plan for debridement of right leg with Acell and VAC placement.  Needs to keep her leg elevated as much as possible.  Nutrition consult.  Multivitamin daily, Vit C 500 mg twice daily and protein  drinks. Zinc 220 mg daily.  Wallace Going 08/29/2016, 8:48 AM

## 2016-08-30 NOTE — Transfer of Care (Signed)
Immediate Anesthesia Transfer of Care Note  Patient: Amanda Horton  Procedure(s) Performed: Procedure(s): IRRIGATION AND DEBRIDEMENT EXTREMITY RIGHT (Right) APPLICATION OF WOUND VAC (Right) APPLICATION OF A-CELL OF EXTREMITY (Right)  Patient Location: PACU  Anesthesia Type:General  Level of Consciousness: awake and alert   Airway & Oxygen Therapy: Patient Spontanous Breathing and Patient connected to nasal cannula oxygen  Post-op Assessment: Report given to RN and Post -op Vital signs reviewed and stable  Post vital signs: Reviewed and stable  Last Vitals:  Vitals:   08/30/16 0036 08/30/16 0423  BP:  138/64  Pulse: 98 96  Resp:  17  Temp:  36.9 C    Last Pain:  Vitals:   08/30/16 0423  TempSrc: Oral  PainSc:       Patients Stated Pain Goal: 3 ([id number])  Complications: No apparent anesthesia complications

## 2016-08-30 NOTE — OR Nursing (Signed)
Pt requested to have CPAP removed.  She has a productive cough and stated "I get my breath better without that on". Pt placed on [place].

## 2016-08-30 NOTE — Brief Op Note (Signed)
08/26/2016 - 08/30/2016  3:12 PM  PATIENT:  [name]  70 y.o. female  PRE-OPERATIVE DIAGNOSIS:  Wound Right LE  POST-OPERATIVE DIAGNOSIS:  Right leg wound  PROCEDURE:  Procedure(s): IRRIGATION AND DEBRIDEMENT EXTREMITY RIGHT W/ A-CELL AND VAC PLACEMENT (Right) APPLICATION OF WOUND VAC (Right) APPLICATION OF A-CELL OF EXTREMITY (Right)  SURGEON:  Surgeon(s) and Role:    * Michaeline Eckersley S Darnice Comrie, DO - Primary  PHYSICIAN ASSISTANT: Shawn Rayburn, PA  ASSISTANTS: none   ANESTHESIA:   general  EBL:  Total I/O In: -  Out: 25 [Blood:25]  BLOOD ADMINISTERED:none  DRAINS: none   LOCAL MEDICATIONS USED:  NONE  SPECIMEN:  No Specimen  DISPOSITION OF SPECIMEN:  N/A  COUNTS:  YES  TOURNIQUET:  * No tourniquets in log *  DICTATION: .Dragon Dictation  PLAN OF CARE: Admit to inpatient   PATIENT DISPOSITION:  PACU - hemodynamically stable.   Delay start of Pharmacological VTE agent (>24hrs) due to surgical blood loss or risk of bleeding: no

## 2016-08-30 NOTE — OR Nursing (Signed)
Ms. Ashburn received in pacu from the OR on a bed.  Medical devices attached. Hand off report received.  Sleep Apnea protocol initiated.  Respiratory therapy notified for CPAP.  Pt. In moderate respiratory distress with prolonged forced exhalation and minimal expiratory wheeze.  CRNA gave albuterol during case.

## 2016-08-31 ENCOUNTER — Inpatient Hospital Stay (HOSPITAL_COMMUNITY): Payer: Commercial Managed Care - HMO

## 2016-08-31 DIAGNOSIS — I517 Cardiomegaly: Secondary | ICD-10-CM

## 2016-08-31 DIAGNOSIS — F329 Major depressive disorder, single episode, unspecified: Secondary | ICD-10-CM

## 2016-08-31 LAB — ECHOCARDIOGRAM COMPLETE
CHL CUP MV DEC (S): 225
E/e' ratio: 12.28
EWDT: 225 ms
FS: 32 % (ref 28–44)
HEIGHTINCHES: 62 in
IVS/LV PW RATIO, ED: 0.96
LA ID, A-P, ES: 39 mm
LA vol index: 19.3 mL/m2
LADIAMINDEX: 1.8 cm/m2
LAVOL: 41.8 mL
LAVOLA4C: 56.7 mL
LEFT ATRIUM END SYS DIAM: 39 mm
LV PW d: 11.7 mm — AB (ref 0.6–1.1)
LV TDI E'LATERAL: 7.94
LVEEAVG: 12.28
LVEEMED: 12.28
LVELAT: 7.94 cm/s
MV Peak grad: 4 mmHg
MV pk A vel: 112 m/s
MV pk E vel: 97.5 m/s
TDI e' medial: 8.38
WEIGHTICAEL: 4318.4 [oz_av]

## 2016-08-31 LAB — BASIC METABOLIC PANEL
ANION GAP: 5 (ref 5–15)
BUN: 8 mg/dL (ref 6–20)
CHLORIDE: 94 mmol/L — AB (ref 101–111)
CO2: 42 mmol/L — AB (ref 22–32)
Calcium: 8.3 mg/dL — ABNORMAL LOW (ref 8.9–10.3)
Creatinine, Ser: 1 mg/dL (ref 0.44–1.00)
GFR calc non Af Amer: 56 mL/min — ABNORMAL LOW (ref >60)
Glucose, Bld: 113 mg/dL — ABNORMAL HIGH (ref 65–99)
POTASSIUM: 3.5 mmol/L (ref 3.5–5.1)
Sodium: 141 mmol/L (ref 135–145)

## 2016-08-31 LAB — CBC
HEMATOCRIT: 30.9 % — AB (ref 36.0–46.0)
HEMOGLOBIN: 9.1 g/dL — AB (ref 12.0–15.0)
MCH: 27.5 pg (ref 26.0–34.0)
MCHC: 29.4 g/dL — ABNORMAL LOW (ref 30.0–36.0)
MCV: 93.4 fL (ref 78.0–100.0)
Platelets: 207 10*3/uL (ref 150–400)
RBC: 3.31 MIL/uL — AB (ref 3.87–5.11)
RDW: 13.2 % (ref 11.5–15.5)
WBC: 5.8 10*3/uL (ref 4.0–10.5)

## 2016-08-31 LAB — GLUCOSE, CAPILLARY
GLUCOSE-CAPILLARY: 124 mg/dL — AB (ref 65–99)
GLUCOSE-CAPILLARY: 128 mg/dL — AB (ref 65–99)
GLUCOSE-CAPILLARY: 133 mg/dL — AB (ref 65–99)
Glucose-Capillary: 127 mg/dL — ABNORMAL HIGH (ref 65–99)

## 2016-08-31 LAB — MAGNESIUM: Magnesium: 2 mg/dL (ref 1.7–2.4)

## 2016-08-31 MED ORDER — PERFLUTREN LIPID MICROSPHERE
1.0000 mL | INTRAVENOUS | Status: AC | PRN
  Administered 2016-08-31: 3 mL via INTRAVENOUS
  Filled 2016-08-31: qty 10

## 2016-08-31 NOTE — Progress Notes (Signed)
Occupational Therapy Treatment Patient Details Name: Amanda Horton MRN: [id number] DOB: Jul 08, 1946 Today's Date: 08/31/2016    History of present illness This is a 70 y.o. female s/p fall and degloving injury of her R lower leg that was reapproximated in ED. 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    OT comments  Pt is improving with functional mobility tasks requiring decreased hands on assistance with toilet transfers. Pt continues to demonstrate decreased activity tolerance, strength, and independence with ADLs s/p fall and degloving injury. Pt instructed in BUE strengthening exercises to improve overall activity tolerance and strength with functional mobility and ADLs. Pt plans to D/C to SNF for continued rehabilitation services. She would continue to benefit from OT services while in acute care in order to increase independence and safety with ADLs.    Follow Up Recommendations  SNF    Equipment Recommendations  None recommended by OT    Recommendations for Other Services      Precautions / Restrictions Precautions Precautions: Fall Precaution Comments: Home O2 (2L at rest; 3L with activity) Restrictions Weight Bearing Restrictions: No       Mobility Bed Mobility Overal bed mobility: Needs Assistance Bed Mobility: Sit to Supine     Supine to sit: Supervision     General bed mobility comments: Required increased time and min assist to reposition once in supine position.  Transfers Overall transfer level: Needs assistance Equipment used: Rolling walker (2 wheeled) Transfers: Sit to/from Stand Sit to Stand: Min guard              Balance Overall balance assessment: Needs assistance Sitting-balance support: No upper extremity supported;Feet supported Sitting balance-Leahy  Scale: Good     Standing balance support: During functional activity;Single extremity supported Standing balance-Leahy Scale: Fair                     ADL Overall ADL's : Needs assistance/impaired                         Toilet Transfer: Min guard;Ambulation;BSC;RW Toilet Transfer Details (indicate cue type and reason): Simulated transfer this session.         Functional mobility during ADLs: Min guard;Rolling walker General ADL Comments: Continued education concerning energy conservation.      Vision                     Perception     Praxis      Cognition   Behavior During Therapy: [facility] for tasks assessed/performed Overall Cognitive Status: Within Functional Limits for tasks assessed                       Extremity/Trunk Assessment               Exercises General Exercises - Upper Extremity Shoulder Flexion: Strengthening;10 reps;Theraband Theraband Level (Shoulder Flexion): Level 1 (Yellow) Shoulder Horizontal ABduction: Strengthening;10 reps;Theraband Theraband Level (Shoulder Horizontal Abduction): Level 1 (Yellow) Elbow Flexion: Strengthening;10 reps;Theraband Theraband Level (Elbow Flexion): Level 1 (Yellow)   Shoulder Instructions       General Comments      Pertinent Vitals/ Pain       Pain Assessment: Faces Faces Pain Scale: Hurts little more Pain Location: R LE Pain Descriptors / Indicators: Discomfort;Sore;Guarding Pain  Intervention(s): Monitored during session;Repositioned  Home Living                                          Prior Functioning/Environment              Frequency  Min 2X/week        Progress Toward Goals  OT Goals(current goals can now be found in the care plan section)  Progress towards OT goals: Progressing toward goals  Acute Rehab OT Goals Patient Stated Goal: to get better and go home OT Goal Formulation: With patient Time For Goal Achievement:  09/10/16 Potential to Achieve Goals: Good ADL Goals Pt Will Perform Upper Body Bathing: with min guard assist;sitting Pt Will Perform Upper Body Dressing: with min guard assist;sitting Pt Will Perform Tub/Shower Transfer: with min guard assist Additional ADL Goal #1: Pt will verbalize and/or demonstrate energy conservation techniques during UB dressing while seated at EOB.  Plan Discharge plan remains appropriate    Co-evaluation                 End of Session Equipment Utilized During Treatment: Rolling walker;Gait belt   Activity Tolerance Patient tolerated treatment well   Patient Left in chair;with call bell/phone within reach   Nurse Communication          Time: [id number] OT Time Calculation (min): 33 min  Charges: OT Treatments $Self Care/Home Management : 8-22 mins $Therapeutic Exercise: 8-22 mins  Norman Herrlich, OTR/L [phone] 08/31/2016, 3:37 PM

## 2016-08-31 NOTE — Progress Notes (Signed)
Pt had a good day today a little disorientated at times but a good day all in all.  IV infiltrated and was D/C, IV team called

## 2016-08-31 NOTE — Progress Notes (Signed)
  Echocardiogram 2D Echocardiogram has been performed.  Amanda Horton 08/31/2016, 12:10 PM

## 2016-08-31 NOTE — Clinical Social Work Note (Signed)
Clinical Social Work Assessment  Patient Details  Name: Amanda Horton MRN: [id number] Date of Birth: [birth year]  Date of referral:  08/31/16               Reason for consult:  Facility Placement                Permission sought to share information with:   (Facilities) Permission granted to share information::   (Facilities)  Name::        Agency::     Relationship::     Contact Information:     Housing/Transportation Living arrangements for the past 2 months:  Single Family Home Source of Information:  Patient Patient Interpreter Needed:  None Criminal Activity/Legal Involvement Pertinent to Current Situation/Hospitalization:  No - Comment as needed Significant Relationships:  Adult Children (Patient states that her sons live with her.) Lives with:  Adult Children Do you feel safe going back to the place where you live?   (Patient is interested in facility.) Need for family participation in patient care:  Yes (Comment)  Care giving concerns:  Patient states that she now needs assistance with ADL's.   Social Worker assessment / plan:  SW met with patient at bedside. Patient is alert and oriented. There was no family present. Patient states that due to surgery she will now need assistance with completing ADLs. Patient is interested in SNF. SW will refer pt to SNF.  Employment status:  Retired Forensic scientist:   [employer].) PT Recommendations:  [place] / Referral to community resources:   (SNF)  Patient/Family's Response to care:  Appropriate.  Patient/Family's Understanding of and Emotional Response to Diagnosis, Current Treatment, and Prognosis:  No questions for SW.  Emotional Assessment Appearance:    Attitude/Demeanor/Rapport:   (Appropriate.) Affect (typically observed):  Accepting Orientation:  Oriented to Self, Oriented to Place, Oriented to  Time, Oriented to Situation Alcohol / Substance use:  Not Applicable Psych  involvement (Current and /or in the community):     Discharge Needs  Concerns to be addressed:  No discharge needs identified Readmission within the last 30 days:  No Current discharge risk:  None Barriers to Discharge:  No Barriers Identified   Bernita Buffy 08/31/2016, 10:32 AM

## 2016-08-31 NOTE — Clinical Social Work Placement (Signed)
   CLINICAL SOCIAL WORK PLACEMENT  NOTE  Date:  08/31/2016  Patient Details  Name: Amanda Horton MRN: [id number] Date of Birth: Sep 28, 1946  Clinical Social Work is seeking post-discharge placement for this patient at the [place] level of care (*CSW will initial, date and re-position this form in  chart as items are completed):  Yes   Patient/family provided with [place] Work Department's list of facilities offering this level of care within the geographic area requested by the patient (or if unable, by the patient's family).  Yes   Patient/family informed of their freedom to choose among providers that offer the needed level of care, that participate in Medicare, Medicaid or managed care program needed by the patient, have an available bed and are willing to accept the patient.  Yes   Patient/family informed of [facility]'s ownership interest in [facility] and [facility], as well as of the fact that they are under no obligation to receive care at these facilities.  PASRR submitted to EDS on       PASRR number received on       Existing PASRR number confirmed on       FL2 transmitted to all facilities in geographic area requested by pt/family on       FL2 transmitted to all facilities within larger geographic area on       Patient informed that his/her managed care company has contracts with or will negotiate with certain facilities, including the following:        Yes   Patient/family informed of bed offers received.  Patient [facility] bed at  [facility])     Physician recommends and patient chooses bed at      Patient to be transferred to  [facility]) on 09/01/16.  Patient to be transferred to facility by  [facility])     Patient family notified on 08/31/16 of transfer.  Name of family member notified:   (SW spoke with patient and informed her upon discharge PTAR will be arranged.)     PHYSICIAN       Additional  Comment:    _______________________________________________ Bernita Buffy 08/31/2016, 10:40 AM

## 2016-08-31 NOTE — Progress Notes (Signed)
[facility] PROGRESS NOTE  Amanda Horton [id number] DOB: Apr 25, 1946 DOA: 08/26/2016  PCP: Nicoletta Dress, MD  Brief History/Interval Summary: Amanda Horton is a 70 y.o. female with a medical history significant for COPD on home 02. Patient presented to ED after a fall at home. She fell while trying to walk up a step and sustained significant injury to right lower leg. Fall was mechanical without any preceding dizziness or weakness. There was avulsion injury to the skin which was sutured by the EDP.  Reason for Visit: RLE skin avulsion  Consultants: PRS  Procedures: None yet.  Antibiotics: None  Subjective/Interval History: Feels much better, denies any new complaints.  ROS: Denies nausea, vomiting.  Objective:  Vital Signs  Vitals:   08/30/16 1745 08/30/16 2234 08/31/16 0145 08/31/16 0455  BP: 131/67 (!) 151/82  (!) 145/64  Pulse: 95 100 (!) 102 88  Resp:   18   Temp: 98.1 F (36.7 C) 98.4 F (36.9 C)  98.1 F (36.7 C)  TempSrc: Oral Oral  Oral  SpO2: 99% 96% 98% 97%  Weight:      Height:        Intake/Output Summary (Last 24 hours) at 08/31/16 1102 Last data filed at 08/31/16 0900  Gross per 24 hour  Intake             1340 ml  Output               26 ml  Net             1314 ml   Filed Weights   08/26/16 2319 08/27/16 0639  Weight: 113.4 kg (250 lb) 122.4 kg (269 lb 14.4 oz)    General appearance: alert, cooperative, appears stated age and no distress Resp: Improved air entry bilaterally. Less wheezing today. Fewer crackles.  Cardio: regular rate and rhythm, S1, S2 normal, no murmur, click, rub or gallop GI: soft, non-tender; bowel sounds normal; no masses,  no organomegaly Extremities: Right leg is covered in a dressing. No drainage is noted. Pulses: 2+ and symmetric Neurologic: Awake and alert. Oriented 3. No focal neurological deficits.  Lab Results:  Data Reviewed: I have personally reviewed following labs and imaging  studies  CBC:  Recent Labs Lab 08/26/16 0004 08/28/16 0513 08/29/16 0405 08/30/16 0532 08/31/16 0437  WBC 7.6 7.0 6.9 5.9 5.8  NEUTROABS 5.2  --   --   --   --   HGB 11.1* 9.5* 9.4* 9.3* 9.1*  HCT 37.9 32.6* 32.1* 30.7* 30.9*  MCV 95.2 94.2 94.7 93.3 93.4  PLT 188 158 164 181 [id number]    Basic Metabolic Panel:  Recent Labs Lab 08/26/16 0004 08/28/16 0513 08/29/16 0405 08/30/16 0532 08/31/16 0437  NA 140 139 142 139 141  K 4.0 3.8 3.6 3.5 3.5  CL 95* 95* 91* 91* 94*  CO2 39* 38* 44* 41* 42*  GLUCOSE 177* 134* 121* 114* 113*  BUN 20 11 7 7 8   CREATININE 1.20* 0.91 1.01* 0.99 1.00  CALCIUM 8.9 8.4* 8.4* 8.3* 8.3*  MG  --   --   --   --  2.0    GFR: Estimated Creatinine Clearance: 65.3 mL/min (by C-G formula based on SCr of 1 mg/dL).  Coagulation Profile:  Recent Labs Lab 08/26/16 0004  INR 1.00    HbA1C: No results for input(s): HGBA1C in the last 72 hours.  CBG:  Recent Labs Lab 08/30/16 1110 08/30/16 1743 08/30/16 2327 08/31/16 0626 08/31/16 1059  GLUCAP  116* 126* 124* 124* 127*    Radiology Studies: No results found.   Medications:  Scheduled: . escitalopram  10 mg Oral Daily  . furosemide  60 mg Intravenous Q12H  . furosemide  80 mg Oral Once  . guaiFENesin  600 mg Oral BID  . insulin aspart  0-9 Units Subcutaneous TID WC  . magnesium sulfate 1 - 4 g bolus IVPB  2 g Intravenous Once  . mouth rinse  15 mL Mouth Rinse BID  . mometasone-formoterol  2 puff Inhalation BID  . pantoprazole  40 mg Oral Daily  . potassium chloride SA  40 mEq Oral BID  . vancomycin  1,000 mg Intravenous Q12H   Continuous:  [id number] **OR** acetaminophen, bisacodyl, ipratropium-albuterol, LORazepam, ondansetron **OR** ondansetron (ZOFRAN) IV, oxyCODONE-acetaminophen, polyethylene glycol, temazepam, traZODone  Assessment/Plan:  Active Problems:   Borderline diabetes   Hyperlipemia   GERD (gastroesophageal reflux disease)   COPD (chronic obstructive  pulmonary disease) (HCC)   OSA (obstructive sleep apnea)   Degloving injury   Depression   Essential hypertension   Osteoporosis   AKI (acute kidney injury) ([place])   Degloving injury of lower leg   Fall    Mechanical fall with resultant degloving injury RLE No fractures identified on x-ray. EDP spoke with Trauma Surgery who recommended approximation / suturing of flap which was carried out by Dr. Regenia Skeeter. Patient's pain is reasonably well controlled. Wound care has been consulted. Discussed with seen by PRS, Dr. Marla Roe, surgery was done yesterday with placement of wound VAC. Plans for discharge tomorrow to SNF if it's okay with PRS.  History of COPD on home O2 with possible superimposed pulmonary edema Date entry seems to have improved some. She has fewer crackles today compared to yesterday. Less wheezing as well. Echocardiogram is pending. Continue oxygen. Hold off on steroids. Strict ins and outs and daily weights.  On IV Lasix, continue and continue potassium supplementation. Check BMP in a.m.  Mild AKI Creatinine is stable. Continue to monitor closely. Stopped IV fluids.  Normocytic anemia Some drop in hemoglobin as noted. Some of this could be due to bleeding associated with the fall and the right leg injury. Continue to monitor for now. No active bleeding is noted at this time.Anemia panel reviewed.  OSA  Continue CPAP  GERD.  Continue home PPI  History of essential Hypertension Continue home medications. Monitor Blood pressures closely.  Questionable history of DM2, diet controlled.  Patient denies any history of diabetes. HbA1c is 5.8.  Osteoporosis Continue home medications  Depression Continue home anti-depressant.   DVT Prophylaxis: SCDs   Code Status: Partial code  Family Communication: Discussed with the patient. No family at bedside  Disposition Plan: Management as outlined above. Will likely need placement when ready for discharge.     LOS: 3 days   [facility] A  [facility] Pager [phone] 08/31/2016, 11:02 AM  If 7PM-7AM, please contact night-coverage at [email or web address], password [facility]

## 2016-08-31 NOTE — Care Management Important Message (Signed)
Important Message  Patient Details  Name: Amanda Horton MRN: [id number] Date of Birth: 10-29-1946   Medicare Important Message Given:       Orbie Pyo 08/31/2016, 3:18 PM

## 2016-08-31 NOTE — Progress Notes (Signed)
Physical Therapy Treatment Patient Details Name: Amanda Horton MRN: [id number] DOB: [birth year] Today's Date: 08/31/2016    History of Present Illness This is a 70 y.o. female s/p fall and degloving injury of her R lower leg that was reapproximated in ED. 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     PT Comments    Pt indicates having been up all morning and just returned to bed, but was agreeable to bed exercises.  Pt moving Bil LEs very well despite wound to R LE.  Pt ed on continuing to perform exercises on her own in bed or in the chair to improve overall strength.  Continue to feel pt would benefit from SNF level of therapies at D/C to maximize independence.    Follow Up Recommendations  SNF     Equipment Recommendations  Rolling walker with 5" wheels;Wheelchair (measurements PT);Wheelchair cushion (measurements PT)    Recommendations for Other Services       Precautions / Restrictions Precautions Precautions: Fall Precaution Comments: Home O2 Restrictions Weight Bearing Restrictions: No    Mobility  Bed Mobility Overal bed mobility: Needs Assistance Bed Mobility: Supine to Sit;Sit to Supine     Supine to sit: Supervision Sit to supine: Supervision   General bed mobility comments: pt able to perform without A, but needs increased time.  pt with heavy reliance on UEs and has increased WOB.    Transfers Overall transfer level: Needs assistance Equipment used: None Transfers: Stand Pivot Transfers   Stand pivot transfers: Supervision       General transfer comment: pt able to pivot to/from 3-in-1 with only A for management of lines.    Ambulation/Gait                 Stairs            Wheelchair Mobility    Modified Rankin (Stroke Patients Only)        Balance Overall balance assessment: Needs assistance Sitting-balance support: No upper extremity supported;Feet supported Sitting balance-Leahy Scale: Good     Standing balance support: No upper extremity supported;During functional activity Standing balance-Leahy Scale: Fair                      Cognition Arousal/Alertness: Awake/alert Behavior During Therapy: WFL for tasks assessed/performed Overall Cognitive Status: Within Functional Limits for tasks assessed                      Exercises General Exercises - Lower Extremity Ankle Circles/Pumps: AROM;Both;10 reps Quad Sets: AROM;Both;10 reps Heel Slides: AROM;Both;10 reps Hip ABduction/ADduction: AROM;Both;10 reps Straight Leg Raises: AROM;Both;10 reps    General Comments        Pertinent Vitals/Pain Pain Assessment: 0-10 Pain Score: 3  Pain Location: R LE Pain Descriptors / Indicators: Sore Pain Intervention(s): Monitored during session;Premedicated before session;Repositioned    Home Living                      Prior Function            PT Goals (current goals can now be found in the care plan section) Acute Rehab PT Goals Patient Stated Goal: to get better and go home PT Goal Formulation: With patient Time For Goal Achievement: 09/04/16 Potential to Achieve Goals: Good Progress towards PT  goals: Progressing toward goals    Frequency    Min 3X/week      PT Plan Current plan remains appropriate    Co-evaluation             End of Session Equipment Utilized During Treatment: Oxygen Activity Tolerance: Patient limited by fatigue Patient left: in bed;with call bell/phone within reach     Time: 0937-1004 PT Time Calculation (min) (ACUTE ONLY): 27 min  Charges:  $Therapeutic Exercise: 8-22 mins $Therapeutic Activity: 8-22 mins                    G CodesCatarina Horton, [place] 08/31/2016, 10:29 AM

## 2016-09-01 ENCOUNTER — Encounter (HOSPITAL_COMMUNITY): Payer: Self-pay | Admitting: Plastic Surgery

## 2016-09-01 DIAGNOSIS — J418 Mixed simple and mucopurulent chronic bronchitis: Secondary | ICD-10-CM | POA: Diagnosis not present

## 2016-09-01 DIAGNOSIS — E785 Hyperlipidemia, unspecified: Secondary | ICD-10-CM | POA: Diagnosis not present

## 2016-09-01 DIAGNOSIS — Z9981 Dependence on supplemental oxygen: Secondary | ICD-10-CM | POA: Diagnosis not present

## 2016-09-01 DIAGNOSIS — J441 Chronic obstructive pulmonary disease with (acute) exacerbation: Secondary | ICD-10-CM | POA: Diagnosis not present

## 2016-09-01 DIAGNOSIS — M79609 Pain in unspecified limb: Secondary | ICD-10-CM | POA: Diagnosis not present

## 2016-09-01 DIAGNOSIS — Z993 Dependence on wheelchair: Secondary | ICD-10-CM | POA: Diagnosis not present

## 2016-09-01 DIAGNOSIS — T148 Other injury of unspecified body region: Secondary | ICD-10-CM | POA: Diagnosis present

## 2016-09-01 DIAGNOSIS — N179 Acute kidney failure, unspecified: Secondary | ICD-10-CM | POA: Diagnosis not present

## 2016-09-01 DIAGNOSIS — M899 Disorder of bone, unspecified: Secondary | ICD-10-CM | POA: Diagnosis not present

## 2016-09-01 DIAGNOSIS — R262 Difficulty in walking, not elsewhere classified: Secondary | ICD-10-CM | POA: Diagnosis not present

## 2016-09-01 DIAGNOSIS — M1990 Unspecified osteoarthritis, unspecified site: Secondary | ICD-10-CM | POA: Diagnosis not present

## 2016-09-01 DIAGNOSIS — F329 Major depressive disorder, single episode, unspecified: Secondary | ICD-10-CM | POA: Diagnosis not present

## 2016-09-01 DIAGNOSIS — S81801D Unspecified open wound, right lower leg, subsequent encounter: Secondary | ICD-10-CM | POA: Diagnosis not present

## 2016-09-01 DIAGNOSIS — M810 Age-related osteoporosis without current pathological fracture: Secondary | ICD-10-CM | POA: Diagnosis not present

## 2016-09-01 DIAGNOSIS — S81801A Unspecified open wound, right lower leg, initial encounter: Secondary | ICD-10-CM | POA: Diagnosis not present

## 2016-09-01 DIAGNOSIS — R0603 Acute respiratory distress: Secondary | ICD-10-CM | POA: Diagnosis not present

## 2016-09-01 DIAGNOSIS — I872 Venous insufficiency (chronic) (peripheral): Secondary | ICD-10-CM | POA: Diagnosis not present

## 2016-09-01 DIAGNOSIS — W19XXXA Unspecified fall, initial encounter: Secondary | ICD-10-CM | POA: Diagnosis not present

## 2016-09-01 DIAGNOSIS — K219 Gastro-esophageal reflux disease without esophagitis: Secondary | ICD-10-CM | POA: Diagnosis not present

## 2016-09-01 DIAGNOSIS — J449 Chronic obstructive pulmonary disease, unspecified: Secondary | ICD-10-CM | POA: Diagnosis not present

## 2016-09-01 DIAGNOSIS — D631 Anemia in chronic kidney disease: Secondary | ICD-10-CM | POA: Diagnosis present

## 2016-09-01 DIAGNOSIS — Y92009 Unspecified place in unspecified non-institutional (private) residence as the place of occurrence of the external cause: Secondary | ICD-10-CM | POA: Diagnosis not present

## 2016-09-01 DIAGNOSIS — M47817 Spondylosis without myelopathy or radiculopathy, lumbosacral region: Secondary | ICD-10-CM | POA: Diagnosis not present

## 2016-09-01 DIAGNOSIS — N189 Chronic kidney disease, unspecified: Secondary | ICD-10-CM | POA: Diagnosis not present

## 2016-09-01 DIAGNOSIS — I129 Hypertensive chronic kidney disease with stage 1 through stage 4 chronic kidney disease, or unspecified chronic kidney disease: Secondary | ICD-10-CM | POA: Diagnosis not present

## 2016-09-01 DIAGNOSIS — S91001S Unspecified open wound, right ankle, sequela: Secondary | ICD-10-CM | POA: Diagnosis not present

## 2016-09-01 DIAGNOSIS — S81801S Unspecified open wound, right lower leg, sequela: Secondary | ICD-10-CM | POA: Diagnosis not present

## 2016-09-01 DIAGNOSIS — R0902 Hypoxemia: Secondary | ICD-10-CM | POA: Diagnosis not present

## 2016-09-01 DIAGNOSIS — Z87891 Personal history of nicotine dependence: Secondary | ICD-10-CM | POA: Diagnosis not present

## 2016-09-01 DIAGNOSIS — J9610 Chronic respiratory failure, unspecified whether with hypoxia or hypercapnia: Secondary | ICD-10-CM | POA: Diagnosis present

## 2016-09-01 DIAGNOSIS — R7303 Prediabetes: Secondary | ICD-10-CM | POA: Diagnosis not present

## 2016-09-01 DIAGNOSIS — I10 Essential (primary) hypertension: Secondary | ICD-10-CM | POA: Diagnosis not present

## 2016-09-01 DIAGNOSIS — I491 Atrial premature depolarization: Secondary | ICD-10-CM | POA: Diagnosis not present

## 2016-09-01 DIAGNOSIS — W109XXA Fall (on) (from) unspecified stairs and steps, initial encounter: Secondary | ICD-10-CM | POA: Diagnosis present

## 2016-09-01 DIAGNOSIS — J9601 Acute respiratory failure with hypoxia: Secondary | ICD-10-CM | POA: Diagnosis not present

## 2016-09-01 DIAGNOSIS — E538 Deficiency of other specified B group vitamins: Secondary | ICD-10-CM | POA: Diagnosis not present

## 2016-09-01 DIAGNOSIS — D649 Anemia, unspecified: Secondary | ICD-10-CM | POA: Diagnosis not present

## 2016-09-01 DIAGNOSIS — E1122 Type 2 diabetes mellitus with diabetic chronic kidney disease: Secondary | ICD-10-CM | POA: Diagnosis present

## 2016-09-01 DIAGNOSIS — L03115 Cellulitis of right lower limb: Secondary | ICD-10-CM | POA: Diagnosis present

## 2016-09-01 DIAGNOSIS — J188 Other pneumonia, unspecified organism: Secondary | ICD-10-CM | POA: Diagnosis not present

## 2016-09-01 DIAGNOSIS — R0602 Shortness of breath: Secondary | ICD-10-CM | POA: Diagnosis not present

## 2016-09-01 DIAGNOSIS — Z6841 Body Mass Index (BMI) 40.0 and over, adult: Secondary | ICD-10-CM | POA: Diagnosis not present

## 2016-09-01 DIAGNOSIS — E559 Vitamin D deficiency, unspecified: Secondary | ICD-10-CM | POA: Diagnosis not present

## 2016-09-01 DIAGNOSIS — M439 Deforming dorsopathy, unspecified: Secondary | ICD-10-CM | POA: Diagnosis not present

## 2016-09-01 DIAGNOSIS — Z23 Encounter for immunization: Secondary | ICD-10-CM | POA: Diagnosis not present

## 2016-09-01 DIAGNOSIS — S81811A Laceration without foreign body, right lower leg, initial encounter: Secondary | ICD-10-CM | POA: Diagnosis present

## 2016-09-01 DIAGNOSIS — I739 Peripheral vascular disease, unspecified: Secondary | ICD-10-CM | POA: Diagnosis not present

## 2016-09-01 DIAGNOSIS — M48061 Spinal stenosis, lumbar region without neurogenic claudication: Secondary | ICD-10-CM | POA: Diagnosis not present

## 2016-09-01 DIAGNOSIS — Z90711 Acquired absence of uterus with remaining cervical stump: Secondary | ICD-10-CM | POA: Diagnosis not present

## 2016-09-01 DIAGNOSIS — G4733 Obstructive sleep apnea (adult) (pediatric): Secondary | ICD-10-CM | POA: Diagnosis not present

## 2016-09-01 DIAGNOSIS — E119 Type 2 diabetes mellitus without complications: Secondary | ICD-10-CM | POA: Diagnosis not present

## 2016-09-01 LAB — GLUCOSE, CAPILLARY
GLUCOSE-CAPILLARY: 102 mg/dL — AB (ref 65–99)
GLUCOSE-CAPILLARY: 131 mg/dL — AB (ref 65–99)
Glucose-Capillary: 149 mg/dL — ABNORMAL HIGH (ref 65–99)

## 2016-09-01 LAB — BASIC METABOLIC PANEL
Anion gap: 6 (ref 5–15)
BUN: 10 mg/dL (ref 6–20)
CHLORIDE: 93 mmol/L — AB (ref 101–111)
CO2: 42 mmol/L — ABNORMAL HIGH (ref 22–32)
CREATININE: 1.04 mg/dL — AB (ref 0.44–1.00)
Calcium: 8.2 mg/dL — ABNORMAL LOW (ref 8.9–10.3)
GFR, EST NON AFRICAN AMERICAN: 53 mL/min — AB (ref >60)
Glucose, Bld: 98 mg/dL (ref 65–99)
POTASSIUM: 3.4 mmol/L — AB (ref 3.5–5.1)
SODIUM: 141 mmol/L (ref 135–145)

## 2016-09-01 MED ORDER — POTASSIUM CHLORIDE CRYS ER 20 MEQ PO TBCR
60.0000 meq | EXTENDED_RELEASE_TABLET | Freq: Once | ORAL | Status: AC
  Administered 2016-09-01: 60 meq via ORAL

## 2016-09-01 MED ORDER — LORAZEPAM 1 MG PO TABS: 1.0000 mg | ORAL_TABLET | Freq: Three times a day (TID) | ORAL | 0 refills | Status: DC | PRN

## 2016-09-01 MED ORDER — OXYCODONE-ACETAMINOPHEN 5-325 MG PO TABS: 1.0000 | ORAL_TABLET | Freq: Four times a day (QID) | ORAL | 0 refills | Status: DC | PRN

## 2016-09-01 MED ORDER — TEMAZEPAM 15 MG PO CAPS: 15.0000 mg | ORAL_CAPSULE | Freq: Every evening | ORAL | 0 refills | Status: DC | PRN

## 2016-09-01 NOTE — Progress Notes (Signed)
Patient stated she was not ready for CPAP at this time, she didn't have a good night with it last night. She only wore it for 2 hours and took it off. Patient stated she would call if she wore it tonight.

## 2016-09-01 NOTE — Care Management Note (Signed)
Case Management Note  Patient Details  Name: Amanda Horton MRN: [id number] Date of Birth: September 13, 1946  Subjective/Objective:  Mechanical fall with injury to RLE                  Action/Plan: Discharge Planning: AVS reviewed:  Chart reviewed. Scheduled dc to SNF. CSW arranged SNF.    Expected Discharge Date:  09/01/2016              Expected Discharge Plan:  Skilled Nursing Facility  In-House Referral:  Clinical Social Work  Discharge planning Services  CM Consult  Post Acute Care Choice:  NA Choice offered to:  NA  DME Arranged:  N/A DME Agency:  NA  HH Arranged:  NA HH Agency:  NA  Status of Service:  Completed, signed off  If discussed at [employer] of Stay Meetings, dates discussed:    Additional Comments:  Erenest Rasher, RN 09/01/2016, 2:09 PM

## 2016-09-01 NOTE — Discharge Summary (Signed)
Physician Discharge Summary  Amanda Horton [id number] DOB: 1946-02-11 DOA: 08/26/2016  PCP: Nicoletta Dress, MD  Admit date: 08/26/2016 Discharge date: 09/01/2016  Admitted From: Home Disposition:  [facility] SNF  Recommendations for Outpatient Follow-up:  1. Follow up with PCP in 1-2 weeks 2. Please obtain BMP/CBC in one week 3. He will have wound VAC removal late next week. Dr. Marla Roe office will contact you.  Home Health: N/A Equipment/Devices: N/A  Discharge Condition: Stable CODE STATUS: Full Diet recommendation: Heart Healthy  Brief/Interim Summary:  Amanda Horton is a 70 y.o. female with a medical history significant for COPD on home 02. Patient presented to ED last night after a fall at home. She fell while trying to walk up a step and sustained significant injury to right lower leg. Fall was mechanical without any preceding dizziness or weakness.   Discharge Diagnoses:  Active Problems:   Borderline diabetes   Hyperlipemia   GERD (gastroesophageal reflux disease)   COPD (chronic obstructive pulmonary disease) (HCC)   OSA (obstructive sleep apnea)   Degloving injury   Depression   Essential hypertension   Osteoporosis   AKI (acute kidney injury) ([place])   Degloving injury of lower leg   Fall   Mechanical fall with resultant degloving injury RLE No fractures identified on x-ray. EDP spoke with Trauma Surgery who recommended approximation / suturing of flap which was carried out by Dr. Regenia Skeeter. Patient's pain is reasonably well controlled. Wound care has been consulted. Discussed with seen by PRS, Dr. Marla Roe, surgery was done on 08/30/2016 with placement of wound VAC and application of A-cell. Plans for discharge checked with Dr. Marla Roe of plastic surgery prior to discharge. Plastics plan for wound removal late next week, will contact the rehabilitation facility.  History of COPD on home O2 with possible superimposed pulmonary edema Date entry  seems to have improved some. She has fewer crackles today compared to yesterday. Less wheezing as well. Echocardiogram is pending. Continue oxygen. Hold off on steroids. Strict ins and outs and daily weights.  Given IV Lasix, back on her oral Lasix on discharge.  Mild AKI Creatinine is stable. Continue to monitor closely.   Normocytic anemia Some drop in hemoglobin as noted. Some of this could be due to bleeding associated with the fall and the right leg injury. Continue to monitor for now. No active bleeding is noted at this time.Anemia panel reviewed.  OSA  Continue CPAP  GERD.  Continue home PPI  History of essential Hypertension Continue home medications. Monitor Blood pressures closely.  Questionable history of DM2, diet controlled.  Patient denies any history of diabetes. HbA1c is 5.8.  Osteoporosis Continue home medications  Depression Continue home anti-depressant.   Hypokalemia -Mild hypokalemia with potassium 3.4, potassium given prior to discharge.  Discharge Instructions  Discharge Instructions    Diet - low sodium heart healthy    Complete by:  As directed    Increase activity slowly    Complete by:  As directed        Medication List    TAKE these medications   ACCU-CHEK FASTCLIX LANCETS Misc   ACCU-CHEK SMARTVIEW test strip Generic drug:  glucose blood   albuterol 108 (90 Base) MCG/ACT inhaler Commonly known as:  PROVENTIL HFA;VENTOLIN HFA Inhale 2 puffs into the lungs every 6 (six) hours as needed for wheezing or shortness of breath.   albuterol (2.5 MG/3ML) 0.083% nebulizer solution Commonly known as:  PROVENTIL Take 3 mLs (2.5 mg total) by nebulization every 6 (six)  hours as needed for wheezing or shortness of breath.   alendronate 70 MG tablet Commonly known as:  FOSAMAX Take 70 mg by mouth once a week. Take with a full glass of water on an empty stomach.   Calcium Carbonate-Vitamin D 600-400 MG-UNIT tablet Take 1 tablet by  mouth 2 (two) times daily.   cyanocobalamin 500 MCG tablet Take 500 mcg by mouth daily.   DULERA 200-5 MCG/ACT Aero Generic drug:  mometasone-formoterol Inhale 2 puffs into the lungs 2 (two) times daily.   escitalopram 10 MG tablet Commonly known as:  LEXAPRO Take 10 mg by mouth daily.   furosemide 80 MG tablet Commonly known as:  LASIX Take 1 tablet by mouth daily.   guaiFENesin 600 MG 12 hr tablet Commonly known as:  MUCINEX Take 600 mg by mouth 2 (two) times daily.   ipratropium-albuterol 0.5-2.5 (3) MG/3ML Soln Commonly known as:  DUONEB Take 3 mLs by nebulization every 4 (four) hours as needed (for breathing).   LINZESS 145 MCG Caps capsule Generic drug:  linaclotide Take 145 mcg by mouth daily before breakfast.   LORazepam 1 MG tablet Commonly known as:  ATIVAN Take 1 tablet (1 mg total) by mouth every 8 (eight) hours as needed for anxiety.   metoprolol tartrate 25 MG tablet Commonly known as:  LOPRESSOR Take 1 tablet by mouth 2 (two) times daily.   omeprazole 40 MG capsule Commonly known as:  PRILOSEC Take 1 capsule by mouth daily.   oxyCODONE-acetaminophen 5-325 MG tablet Commonly known as:  PERCOCET/ROXICET Take 1 tablet by mouth every 6 (six) hours as needed for moderate pain.   potassium chloride SA 20 MEQ tablet Commonly known as:  K-DUR,KLOR-CON Take 1 tablet by mouth 2 (two) times daily.   ranitidine 150 MG tablet Commonly known as:  ZANTAC Take 1 tablet by mouth 2 (two) times daily.   temazepam 15 MG capsule Commonly known as:  RESTORIL Take 1 capsule (15 mg total) by mouth at bedtime as needed for sleep.   Tiotropium Bromide Monohydrate 2.5 MCG/ACT Aers Inhale 2 puffs into the lungs daily.   Vitamin D (Ergocalciferol) 50000 units Caps capsule Commonly known as:  DRISDOL Take 1 capsule by mouth every 7 (seven) days.   VITAMIN D PO Take 5,000 Units by mouth daily.       Contact information for follow-up providers    CLAIRE S  DILLINGHAM, DO In 2 weeks.   Specialty:  Plastic Surgery Contact information: [place] [address] [phone]            Contact information for after-discharge care    Destination    [facility] .   Specialty:  [place] information: [address] [place] [address] [phone]                 No Known Allergies  Consultations: PRS  Procedures/Studies: Dg Chest Port 1 View  Result Date: 08/27/2016 CLINICAL DATA:  70 year old female with a history of COPD EXAM: PORTABLE CHEST 1 VIEW COMPARISON:  None. FINDINGS: Cardiomediastinal silhouette enlarged. Calcifications of the aortic arch. Mixed airspace disease at the bilateral lung bases partially obscuring the heart borders. Right costophrenic angle is excluded. No pneumothorax. Questionable interlobular septal thickening. IMPRESSION: Cardiomegaly with questionable interstitial edema. Opacities at the lung bases may reflect low lung volumes with atelectasis or consolidation. Correlation with lab values may be useful. Aortic atherosclerosis. Signed, Dulcy Fanny. Earleen [place], DO [facility] Electronically Signed   By: Corrie Mckusick D.O.   On: 08/27/2016 16:54   Dg Tibia/fibula Right Port  Result Date: 08/27/2016 CLINICAL DATA:  [place] down steps. Large avulsion of the tib-fib area. EXAM: PORTABLE RIGHT TIBIA AND FIBULA - 2 VIEW COMPARISON:  None. FINDINGS: Large superficial soft tissue irregularity along the lateral aspect of the right lower leg consistent with history of avulsion. No radiopaque soft tissue foreign bodies are demonstrated. The right tibia and fibula appear intact. No evidence of acute fracture or dislocation. IMPRESSION: Soft tissue avulsion to the lateral aspect of the right lower leg. No radiopaque soft tissue foreign bodies. No acute bony abnormalities. Electronically Signed   By:  Lucienne Capers M.D.   On: 08/27/2016 01:12    (Echo, Carotid, EGD, Colonoscopy, ERCP)    Subjective:   Discharge Exam: Vitals:   08/31/16 2027 09/01/16 0458  BP: (!) 115/56 130/65  Pulse: 100 87  Resp: 16 16  Temp: 98.9 F (37.2 C) 98.6 F (37 C)   Vitals:   08/31/16 1225 08/31/16 2027 08/31/16 2051 09/01/16 0458  BP: 130/63 (!) 115/56  130/65  Pulse: 87 100  87  Resp: 16 16  16   Temp: 98.5 F (36.9 C) 98.9 F (37.2 C)  98.6 F (37 C)  TempSrc: Oral Oral  Oral  SpO2: 97% 97% 95% 100%  Weight:    112.5 kg (248 lb)  Height:        General: Pt is alert, awake, not in acute distress Cardiovascular: RRR, S1/S2 +, no rubs, no gallops Respiratory: CTA bilaterally, no wheezing, no rhonchi Abdominal: Soft, NT, ND, bowel sounds + Extremities: no edema, no cyanosis    The results of significant diagnostics from this hospitalization (including imaging, microbiology, ancillary and laboratory) are listed below for reference.     Microbiology: Recent Results (from the past 240 hour(s))  Surgical pcr screen     Status: None   Collection Time: 08/30/16  4:22 AM  Result Value Ref Range Status   MRSA, PCR NEGATIVE NEGATIVE Final   Staphylococcus aureus NEGATIVE NEGATIVE Final    Comment:        The Xpert SA Assay (FDA approved for NASAL specimens in patients over 59 years of age), is one component of a comprehensive surveillance program.  Test performance has been validated by [facility] for patients greater than or equal to 48 year old. It is not intended to diagnose infection nor to guide or monitor treatment.      Labs: BNP (last 3 results) No results for input(s): BNP in the last 8760 hours. Basic Metabolic Panel:  Recent Labs Lab 08/28/16 0513 08/29/16 0405 08/30/16 0532 08/31/16 0437 09/01/16 0602  NA 139 142 139 141 141  K 3.8 3.6 3.5 3.5 3.4*  CL 95* 91* 91* 94* 93*  CO2 38* 44* 41* 42* 42*  GLUCOSE 134* 121* 114* 113* 98  BUN 11 7 7 8 10    CREATININE 0.91 1.01* 0.99 1.00 1.04*  CALCIUM 8.4* 8.4* 8.3* 8.3* 8.2*  MG  --   --   --  2.0  --    Liver Function Tests: No results for input(s): AST, ALT, ALKPHOS, BILITOT, PROT, ALBUMIN in the last 168 hours. No results for input(s): LIPASE, AMYLASE in the last 168 hours. No results for input(s): AMMONIA in the last 168 hours. CBC:  Recent Labs Lab 08/26/16 0004 08/28/16 0513 08/29/16 0405 08/30/16 0532 08/31/16 0437  WBC 7.6 7.0 6.9  5.9 5.8  NEUTROABS 5.2  --   --   --   --   HGB 11.1* 9.5* 9.4* 9.3* 9.1*  HCT 37.9 32.6* 32.1* 30.7* 30.9*  MCV 95.2 94.2 94.7 93.3 93.4  PLT 188 158 164 181 207   Cardiac Enzymes: No results for input(s): CKTOTAL, CKMB, CKMBINDEX, TROPONINI in the last 168 hours. BNP: Invalid input(s): POCBNP CBG:  Recent Labs Lab 08/31/16 0626 08/31/16 1059 08/31/16 1616 08/31/16 2026 09/01/16 0643  GLUCAP 124* 127* 128* 133* 102*   D-Dimer No results for input(s): DDIMER in the last 72 hours. Hgb A1c No results for input(s): HGBA1C in the last 72 hours. Lipid Profile No results for input(s): CHOL, HDL, LDLCALC, TRIG, CHOLHDL, LDLDIRECT in the last 72 hours. Thyroid function studies No results for input(s): TSH, T4TOTAL, T3FREE, THYROIDAB in the last 72 hours.  Invalid input(s): FREET3 Anemia work up No results for input(s): VITAMINB12, FOLATE, FERRITIN, TIBC, IRON, RETICCTPCT in the last 72 hours. Urinalysis No results found for: COLORURINE, APPEARANCEUR, [place], [place], [place], [place], [place], [place], PROTEINUR, UROBILINOGEN, NITRITE, LEUKOCYTESUR Sepsis Labs Invalid input(s): PROCALCITONIN,  WBC,  LACTICIDVEN Microbiology Recent Results (from the past 240 hour(s))  Surgical pcr screen     Status: None   Collection Time: 08/30/16  4:22 AM  Result Value Ref Range Status   MRSA, PCR NEGATIVE NEGATIVE Final   Staphylococcus aureus NEGATIVE NEGATIVE Final    Comment:        The Xpert SA Assay (FDA approved for NASAL  specimens in patients over 18 years of age), is one component of a comprehensive surveillance program.  Test performance has been validated by [facility] for patients greater than or equal to 61 year old. It is not intended to diagnose infection nor to guide or monitor treatment.      Time coordinating discharge: Over 30 minutes  SIGNED:   Birdie Hopes, MD  [facility] 09/01/2016, 10:37 AM Pager   If 7PM-7AM, please contact night-coverage [email or web address] Password [email or web address]

## 2016-09-01 NOTE — Progress Notes (Signed)
Pharmacy Antibiotic Note  Amanda Horton is a 70 y.o. female admitted on 08/26/2016 with cellulitis s/p degloving injury to RLE.  Pharmacy has been consulted for vancomycin dosing.  Her renal function is stable and patient remains afebrile with WNL WBC.  Plan: Continue vanc 1gm IV Q12H Monitor clinical picture, renal function, vanc trough if not discharged F/U C&S, abx deescalation / LOT Watch K+   Height: 5\' 2"  (157.5 cm) Weight: 248 lb (112.5 kg) IBW/kg (Calculated) : 50.1  Temp (24hrs), Avg:98.5 F (36.9 C), Min:98.1 F (36.7 C), Max:98.9 F (37.2 C)   Recent Labs Lab 08/26/16 0004 08/28/16 0513 08/29/16 0405 08/30/16 0532 08/31/16 0437 09/01/16 0602  WBC 7.6 7.0 6.9 5.9 5.8  --   CREATININE 1.20* 0.91 1.01* 0.99 1.00 1.04*    Estimated Creatinine Clearance: 59.7 mL/min (by C-G formula based on SCr of 1.04 mg/dL (H)).    No Known Allergies   Antimicrobials this admission:  Vanc 9/26 >>  Dose adjustments this admission:  N/A  Microbiology results:  9/27 Surgical PCR: negative   Genecis Veley D. Mina Marble, PharmD, BCPS Pager:  [phone] 09/01/2016, 2:51 PM

## 2016-09-01 NOTE — Progress Notes (Signed)
SW spoke with patient at bedside. Patient was alert and oriented. SW informed her that Corey Harold will be called. Patient was accepting has no questions as this time.  Nurse Report Number: [phone]  Tilda Burrow, MSW [phone]

## 2016-09-01 NOTE — Progress Notes (Signed)
Physical Therapy Treatment Patient Details Name: Amanda Horton MRN: [id number] DOB: [birth year] Today's Date: 09/01/2016    History of Present Illness This is a 70 y.o. female s/p fall and degloving injury of her R lower leg that was reapproximated in ED. 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     PT Comments    Pt performed increased mobility from previous session. Pt required cues for pacing and seated break to improve O2 sats.  Pt has poor activity tolerance and would continue to benefit from skilled rehab in post acute setting to improve strength and endurance.    Follow Up Recommendations  SNF     Equipment Recommendations  Rolling walker with 5" wheels;Wheelchair (measurements PT);Wheelchair cushion (measurements PT)    Recommendations for Other Services       Precautions / Restrictions Precautions Precautions: Fall Precaution Comments: Home O2 (2L at rest 3L with activity) Restrictions Weight Bearing Restrictions: Yes    Mobility  Bed Mobility Overal bed mobility: Needs Assistance Bed Mobility: Supine to Sit     Supine to sit: Modified independent (Device/Increase time)     General bed mobility comments: Good technique.  Transfers Overall transfer level: Needs assistance Equipment used: Rolling walker (2 wheeled) Transfers: Sit to/from Stand Sit to Stand: Min guard         General transfer comment: Cues for hand placement to and from seated surface.    Ambulation/Gait Ambulation/Gait assistance: Min guard Ambulation Distance (Feet): 12 Feet (+28 ft) Assistive device: Rolling walker (2 wheeled) Gait Pattern/deviations: Step-through pattern;Decreased stride length   Gait velocity interpretation: Below normal speed for age/gender General Gait Details: Pt performed  limited gait training.  On 2L O2 sats decreased to 78%, increased O2 to 4L and O2 sats improved to 87%, decreased back to 2L for 2nd trial.  Close chair follow required as patient fatigues quickly. Cues for pacing and upper trunk control.     Stairs            Wheelchair Mobility    Modified Rankin (Stroke Patients Only)       Balance Overall balance assessment: Needs assistance   Sitting balance-Leahy Scale: Good       Standing balance-Leahy Scale: Fair                      Cognition Arousal/Alertness: Awake/alert Behavior During Therapy: WFL for tasks assessed/performed Overall Cognitive Status: Within Functional Limits for tasks assessed                      Exercises      General Comments        Pertinent Vitals/Pain Pain Assessment: 0-10 Pain Score: 2  Pain Location: RLE.   Pain Descriptors / Indicators: Discomfort;Grimacing;Guarding Pain Intervention(s): Monitored during session;Repositioned    Home Living                      Prior Function            PT Goals (current goals can now be found in the care plan section) Acute Rehab PT Goals Patient Stated Goal: to get better and go home Potential to Achieve Goals: Good Progress towards PT goals: Progressing toward goals    Frequency    Min 3X/week      PT  Plan Current plan remains appropriate    Co-evaluation             End of Session Equipment Utilized During Treatment: Oxygen Activity Tolerance: Patient limited by fatigue Patient left: in bed;with call bell/phone within reach     Time: 1252-1325 PT Time Calculation (min) (ACUTE ONLY): 33 min  Charges:  $Therapeutic Exercise: 23-37 mins                    G Codes:      Cristela Blue 2016-09-23, 1:28 PM  Governor Rooks, PTA pager [phone]

## 2016-09-04 DIAGNOSIS — M439 Deforming dorsopathy, unspecified: Secondary | ICD-10-CM | POA: Diagnosis not present

## 2016-09-04 DIAGNOSIS — J9601 Acute respiratory failure with hypoxia: Secondary | ICD-10-CM | POA: Diagnosis not present

## 2016-09-04 DIAGNOSIS — J441 Chronic obstructive pulmonary disease with (acute) exacerbation: Secondary | ICD-10-CM | POA: Diagnosis not present

## 2016-09-04 DIAGNOSIS — R262 Difficulty in walking, not elsewhere classified: Secondary | ICD-10-CM | POA: Diagnosis not present

## 2016-09-05 DIAGNOSIS — J449 Chronic obstructive pulmonary disease, unspecified: Secondary | ICD-10-CM | POA: Diagnosis not present

## 2016-09-05 DIAGNOSIS — Z9981 Dependence on supplemental oxygen: Secondary | ICD-10-CM | POA: Diagnosis not present

## 2016-09-05 DIAGNOSIS — R0602 Shortness of breath: Secondary | ICD-10-CM | POA: Diagnosis not present

## 2016-09-05 DIAGNOSIS — Z993 Dependence on wheelchair: Secondary | ICD-10-CM | POA: Diagnosis not present

## 2016-09-05 DIAGNOSIS — S81801D Unspecified open wound, right lower leg, subsequent encounter: Secondary | ICD-10-CM | POA: Diagnosis not present

## 2016-09-05 DIAGNOSIS — R0603 Acute respiratory distress: Secondary | ICD-10-CM | POA: Diagnosis not present

## 2016-09-11 ENCOUNTER — Ambulatory Visit: Admit: 2016-09-11 | Payer: Commercial Managed Care - HMO | Admitting: Plastic Surgery

## 2016-09-11 SURGERY — IRRIGATION AND DEBRIDEMENT WOUND
Anesthesia: General | Laterality: Right

## 2016-09-12 DIAGNOSIS — S81801D Unspecified open wound, right lower leg, subsequent encounter: Secondary | ICD-10-CM | POA: Diagnosis not present

## 2016-09-15 ENCOUNTER — Encounter (HOSPITAL_COMMUNITY): Payer: Self-pay | Admitting: Plastic Surgery

## 2016-09-18 ENCOUNTER — Ambulatory Visit: Payer: Self-pay | Admitting: Physician Assistant

## 2016-09-18 DIAGNOSIS — Z23 Encounter for immunization: Secondary | ICD-10-CM | POA: Diagnosis not present

## 2016-09-19 ENCOUNTER — Encounter (HOSPITAL_COMMUNITY): Payer: Self-pay | Admitting: *Deleted

## 2016-09-19 NOTE — Pre-Procedure Instructions (Signed)
    Jodiann Smouse  09/19/2016     Katherina Mires procedure is scheduled on Wednesday, September 20, 2016 at 12:15 PM.   Report to [facility] Entrance "A" Admitting Office at 9:45 AM.   Call this number if you have problems the morning of surgery: [phone]   Remember:  Do not eat food or drink liquids after midnight tonight.  Take these medicines the morning of surgery with A SIP OF WATER: Guaifenesin (Mucinex), Metoprolol (Lopressor), Omeprazole (Prilosec), Ranitidine (Zantac), Lorazepam (Ativan) - if needed. Dulera inhaler, Tiotropium Bromide inhaler, Duoneb - if needed, Albuterol inhaler - if needed, Albuterol nebulizer - if needed. Have pt bring Albuterol inhaler with her. Per Dr. Roderic Palau, Anesthesiologist/T. Stan Head, RN   Do not wear jewelry, make-up or nail polish.  Do not wear lotions, powders, or perfumes.  Do not shave 48 hours prior to surgery.    Do not bring valuables to the hospital.  [facility] is not responsible for any belongings or valuables.  Contacts, dentures or bridgework may not be worn into surgery.  Leave your suitcase in the car.  After surgery it may be brought to your room.  For patients admitted to the hospital, discharge time will be determined by your treatment team.  Patients discharged the day of surgery will not be allowed to drive home.   Any questions today, please call me, Lilia Pro, RN at [phone].

## 2016-09-19 NOTE — OR Nursing (Signed)
Late entry due to charting error

## 2016-09-19 NOTE — Progress Notes (Signed)
Pt is a resident at [facility]. Spoke with Marzetta Board, LPN for pre-op call. She states pt is alert and oriented, able to speak for herself. Pt is pre-diabetic, they do not check her blood sugar the facility. Last A1C was 5.8 on 08/27/16. Pt does have sleep apnea and is using CPAP "most of the time". Marzetta Board states pt had flu shot yesterday. Pt will be returning to [facility] after surgery. Faxed pre-op instructions to [place] at [facility].

## 2016-09-20 ENCOUNTER — Encounter (HOSPITAL_COMMUNITY): Payer: Self-pay | Admitting: Plastic Surgery

## 2016-09-20 ENCOUNTER — Ambulatory Visit (HOSPITAL_COMMUNITY): Payer: Commercial Managed Care - HMO | Admitting: Certified Registered Nurse Anesthetist

## 2016-09-20 ENCOUNTER — Encounter (HOSPITAL_COMMUNITY)
Admission: RE | Disposition: A | Discharge status: Skilled Nursing Facility | Payer: Self-pay | Source: Ambulatory Visit | Attending: Plastic Surgery

## 2016-09-20 ENCOUNTER — Ambulatory Visit (HOSPITAL_COMMUNITY)
Admission: RE | Admit: 2016-09-20 | Discharge: 2016-09-20 | Disposition: A | Discharge status: Skilled Nursing Facility | Payer: Commercial Managed Care - HMO | Source: Ambulatory Visit | Attending: Plastic Surgery | Admitting: Plastic Surgery

## 2016-09-20 DIAGNOSIS — I491 Atrial premature depolarization: Secondary | ICD-10-CM | POA: Diagnosis not present

## 2016-09-20 DIAGNOSIS — E538 Deficiency of other specified B group vitamins: Secondary | ICD-10-CM | POA: Insufficient documentation

## 2016-09-20 DIAGNOSIS — E785 Hyperlipidemia, unspecified: Secondary | ICD-10-CM | POA: Insufficient documentation

## 2016-09-20 DIAGNOSIS — Z90711 Acquired absence of uterus with remaining cervical stump: Secondary | ICD-10-CM | POA: Insufficient documentation

## 2016-09-20 DIAGNOSIS — N189 Chronic kidney disease, unspecified: Secondary | ICD-10-CM | POA: Diagnosis not present

## 2016-09-20 DIAGNOSIS — S81801A Unspecified open wound, right lower leg, initial encounter: Secondary | ICD-10-CM | POA: Insufficient documentation

## 2016-09-20 DIAGNOSIS — K219 Gastro-esophageal reflux disease without esophagitis: Secondary | ICD-10-CM | POA: Diagnosis not present

## 2016-09-20 DIAGNOSIS — G4733 Obstructive sleep apnea (adult) (pediatric): Secondary | ICD-10-CM | POA: Diagnosis not present

## 2016-09-20 DIAGNOSIS — I129 Hypertensive chronic kidney disease with stage 1 through stage 4 chronic kidney disease, or unspecified chronic kidney disease: Secondary | ICD-10-CM | POA: Insufficient documentation

## 2016-09-20 DIAGNOSIS — I10 Essential (primary) hypertension: Secondary | ICD-10-CM | POA: Diagnosis not present

## 2016-09-20 DIAGNOSIS — M1990 Unspecified osteoarthritis, unspecified site: Secondary | ICD-10-CM | POA: Insufficient documentation

## 2016-09-20 DIAGNOSIS — S81801D Unspecified open wound, right lower leg, subsequent encounter: Secondary | ICD-10-CM | POA: Diagnosis not present

## 2016-09-20 DIAGNOSIS — Z6841 Body Mass Index (BMI) 40.0 and over, adult: Secondary | ICD-10-CM | POA: Insufficient documentation

## 2016-09-20 DIAGNOSIS — E559 Vitamin D deficiency, unspecified: Secondary | ICD-10-CM | POA: Insufficient documentation

## 2016-09-20 DIAGNOSIS — M48061 Spinal stenosis, lumbar region without neurogenic claudication: Secondary | ICD-10-CM | POA: Insufficient documentation

## 2016-09-20 DIAGNOSIS — J449 Chronic obstructive pulmonary disease, unspecified: Secondary | ICD-10-CM | POA: Diagnosis not present

## 2016-09-20 DIAGNOSIS — I872 Venous insufficiency (chronic) (peripheral): Secondary | ICD-10-CM | POA: Diagnosis not present

## 2016-09-20 DIAGNOSIS — S81801S Unspecified open wound, right lower leg, sequela: Secondary | ICD-10-CM | POA: Diagnosis not present

## 2016-09-20 DIAGNOSIS — M810 Age-related osteoporosis without current pathological fracture: Secondary | ICD-10-CM | POA: Insufficient documentation

## 2016-09-20 DIAGNOSIS — D649 Anemia, unspecified: Secondary | ICD-10-CM | POA: Insufficient documentation

## 2016-09-20 DIAGNOSIS — W19XXXA Unspecified fall, initial encounter: Secondary | ICD-10-CM | POA: Insufficient documentation

## 2016-09-20 DIAGNOSIS — Z87891 Personal history of nicotine dependence: Secondary | ICD-10-CM | POA: Insufficient documentation

## 2016-09-20 DIAGNOSIS — I739 Peripheral vascular disease, unspecified: Secondary | ICD-10-CM | POA: Insufficient documentation

## 2016-09-20 HISTORY — PX: INCISION AND DRAINAGE OF WOUND: SHX1803

## 2016-09-20 HISTORY — PX: APPLICATION OF A-CELL OF EXTREMITY: SHX6303

## 2016-09-20 HISTORY — DX: Dependence on supplemental oxygen: Z99.81

## 2016-09-20 HISTORY — PX: APPLICATION OF WOUND VAC: SHX5189

## 2016-09-20 LAB — BASIC METABOLIC PANEL
ANION GAP: 9 (ref 5–15)
BUN: 11 mg/dL (ref 6–20)
CO2: 33 mmol/L — ABNORMAL HIGH (ref 22–32)
Calcium: 8.8 mg/dL — ABNORMAL LOW (ref 8.9–10.3)
Chloride: 98 mmol/L — ABNORMAL LOW (ref 101–111)
Creatinine, Ser: 0.85 mg/dL (ref 0.44–1.00)
GFR calc Af Amer: >60 mL/min (ref >60)
Glucose, Bld: 124 mg/dL — ABNORMAL HIGH (ref 65–99)
POTASSIUM: 3.9 mmol/L (ref 3.5–5.1)
SODIUM: 140 mmol/L (ref 135–145)

## 2016-09-20 LAB — CBC
HCT: 32.6 % — ABNORMAL LOW (ref 36.0–46.0)
Hemoglobin: 9.8 g/dL — ABNORMAL LOW (ref 12.0–15.0)
MCH: 26.9 pg (ref 26.0–34.0)
MCHC: 30.1 g/dL (ref 30.0–36.0)
MCV: 89.6 fL (ref 78.0–100.0)
PLATELETS: 257 10*3/uL (ref 150–400)
RBC: 3.64 MIL/uL — AB (ref 3.87–5.11)
RDW: 13.2 % (ref 11.5–15.5)
WBC: 6.5 10*3/uL (ref 4.0–10.5)

## 2016-09-20 LAB — GLUCOSE, CAPILLARY
GLUCOSE-CAPILLARY: 122 mg/dL — AB (ref 65–99)
GLUCOSE-CAPILLARY: 143 mg/dL — AB (ref 65–99)

## 2016-09-20 SURGERY — IRRIGATION AND DEBRIDEMENT WOUND
Anesthesia: General | Site: Leg Lower | Laterality: Right

## 2016-09-20 MED ORDER — 0.9 % SODIUM CHLORIDE (POUR BTL) OPTIME
TOPICAL | Status: DC | PRN
  Administered 2016-09-20: 1000 mL

## 2016-09-20 MED ORDER — EPHEDRINE SULFATE 50 MG/ML IJ SOLN
INTRAMUSCULAR | Status: DC | PRN
  Administered 2016-09-20: 10 mg via INTRAVENOUS

## 2016-09-20 MED ORDER — FENTANYL CITRATE (PF) 100 MCG/2ML IJ SOLN: 25.0000 ug | INTRAMUSCULAR | Status: DC | PRN

## 2016-09-20 MED ORDER — LIDOCAINE-EPINEPHRINE 1 %-1:100000 IJ SOLN
INTRAMUSCULAR | Status: AC
  Filled 2016-09-20: qty 1

## 2016-09-20 MED ORDER — CEFAZOLIN SODIUM-DEXTROSE 2-4 GM/100ML-% IV SOLN
2.0000 g | INTRAVENOUS | Status: AC
  Administered 2016-09-20: 2 g via INTRAVENOUS
  Filled 2016-09-20: qty 100

## 2016-09-20 MED ORDER — FENTANYL CITRATE (PF) 100 MCG/2ML IJ SOLN
INTRAMUSCULAR | Status: DC | PRN
  Administered 2016-09-20: 50 ug via INTRAVENOUS

## 2016-09-20 MED ORDER — METOPROLOL TARTRATE 25 MG PO TABS
25.0000 mg | ORAL_TABLET | Freq: Once | ORAL | Status: AC
  Administered 2016-09-20: 25 mg via ORAL

## 2016-09-20 MED ORDER — PHENYLEPHRINE HCL 10 MG/ML IJ SOLN
INTRAMUSCULAR | Status: DC | PRN
  Administered 2016-09-20: 80 ug via INTRAVENOUS
  Administered 2016-09-20: 120 ug via INTRAVENOUS

## 2016-09-20 MED ORDER — SODIUM CHLORIDE 0.9 % IR SOLN
Status: DC | PRN
  Administered 2016-09-20: 500 mL

## 2016-09-20 MED ORDER — PHENYLEPHRINE 40 MCG/ML (10ML) SYRINGE FOR IV PUSH (FOR BLOOD PRESSURE SUPPORT)
PREFILLED_SYRINGE | INTRAVENOUS | Status: AC
  Filled 2016-09-20: qty 10

## 2016-09-20 MED ORDER — LACTATED RINGERS IV SOLN
INTRAVENOUS | Status: DC
  Administered 2016-09-20: 11:00:00 via INTRAVENOUS

## 2016-09-20 MED ORDER — METOPROLOL TARTRATE 12.5 MG HALF TABLET
ORAL_TABLET | ORAL | Status: AC
  Filled 2016-09-20: qty 2

## 2016-09-20 MED ORDER — SUCCINYLCHOLINE CHLORIDE 20 MG/ML IJ SOLN
INTRAMUSCULAR | Status: DC | PRN
  Administered 2016-09-20: 100 mg via INTRAVENOUS

## 2016-09-20 MED ORDER — LIDOCAINE HCL (CARDIAC) 20 MG/ML IV SOLN
INTRAVENOUS | Status: DC | PRN
  Administered 2016-09-20: 70 mg via INTRAVENOUS

## 2016-09-20 MED ORDER — FENTANYL CITRATE (PF) 100 MCG/2ML IJ SOLN
INTRAMUSCULAR | Status: AC
  Filled 2016-09-20: qty 4

## 2016-09-20 MED ORDER — ALBUTEROL SULFATE HFA 108 (90 BASE) MCG/ACT IN AERS
INHALATION_SPRAY | RESPIRATORY_TRACT | Status: AC
  Filled 2016-09-20: qty 13.4

## 2016-09-20 MED ORDER — PROPOFOL 10 MG/ML IV BOLUS
INTRAVENOUS | Status: DC | PRN
  Administered 2016-09-20: 160 mg via INTRAVENOUS

## 2016-09-20 MED ORDER — ALBUTEROL SULFATE HFA 108 (90 BASE) MCG/ACT IN AERS
INHALATION_SPRAY | RESPIRATORY_TRACT | Status: DC | PRN
  Administered 2016-09-20: 3 via RESPIRATORY_TRACT

## 2016-09-20 MED ORDER — ONDANSETRON HCL 4 MG/2ML IJ SOLN
INTRAMUSCULAR | Status: DC | PRN
Start: 2016-09-20 — End: 2016-09-20
  Administered 2016-09-20: 4 mg via INTRAVENOUS

## 2016-09-20 SURGICAL SUPPLY — 59 items
APL SKNCLS STERI-STRIP NONHPOA (GAUZE/BANDAGES/DRESSINGS)
APPLICATOR COTTON TIP 6IN STRL (MISCELLANEOUS) IMPLANT
BAG DECANTER FOR FLEXI CONT (MISCELLANEOUS) ×2 IMPLANT
BANDAGE ACE 4X5 VEL STRL LF (GAUZE/BANDAGES/DRESSINGS) ×2 IMPLANT
BENZOIN TINCTURE PRP APPL 2/3 (GAUZE/BANDAGES/DRESSINGS) ×1 IMPLANT
BNDG GAUZE ELAST 4 BULKY (GAUZE/BANDAGES/DRESSINGS) ×2 IMPLANT
CANISTER SUCTION 2500CC (MISCELLANEOUS) ×1 IMPLANT
CANISTER WOUND CARE 500ML ATS (WOUND CARE) ×3 IMPLANT
CONT SPEC STER OR (MISCELLANEOUS) IMPLANT
COVER SURGICAL LIGHT HANDLE (MISCELLANEOUS) ×3 IMPLANT
DRAPE IMP U-DRAPE 54X76 (DRAPES) ×3 IMPLANT
DRAPE INCISE IOBAN 66X45 STRL (DRAPES) IMPLANT
DRAPE LAPAROSCOPIC ABDOMINAL (DRAPES) IMPLANT
DRAPE LAPAROTOMY 100X72 PEDS (DRAPES) ×3 IMPLANT
DRAPE PROXIMA HALF (DRAPES) IMPLANT
DRESSING HYDROCOLLOID 4X4 XTH (GAUZE/BANDAGES/DRESSINGS) ×2 IMPLANT
DRSG ADAPTIC 3X8 NADH LF (GAUZE/BANDAGES/DRESSINGS) IMPLANT
DRSG CUTIMED SORBACT 7X9 (GAUZE/BANDAGES/DRESSINGS) ×2 IMPLANT
DRSG PAD ABDOMINAL 8X10 ST (GAUZE/BANDAGES/DRESSINGS) IMPLANT
DRSG VAC ATS LRG SENSATRAC (GAUZE/BANDAGES/DRESSINGS) IMPLANT
DRSG VAC ATS MED SENSATRAC (GAUZE/BANDAGES/DRESSINGS) ×2 IMPLANT
DRSG VAC ATS SM SENSATRAC (GAUZE/BANDAGES/DRESSINGS) IMPLANT
ELECT CAUTERY BLADE 6.4 (BLADE) ×3 IMPLANT
ELECT REM PT RETURN 9FT ADLT (ELECTROSURGICAL) ×3
ELECTRODE REM PT RTRN 9FT ADLT (ELECTROSURGICAL) ×1 IMPLANT
GAUZE SPONGE 4X4 12PLY STRL (GAUZE/BANDAGES/DRESSINGS) IMPLANT
GEL ULTRASOUND 20GR AQUASONIC (MISCELLANEOUS) IMPLANT
GLOVE BIO SURGEON STRL SZ 6.5 (GLOVE) ×5 IMPLANT
GLOVE BIO SURGEONS STRL SZ 6.5 (GLOVE) ×3
GOWN STRL REUS W/ TWL LRG LVL3 (GOWN DISPOSABLE) ×3 IMPLANT
GOWN STRL REUS W/TWL LRG LVL3 (GOWN DISPOSABLE) ×9
KIT BASIN OR (CUSTOM PROCEDURE TRAY) ×3 IMPLANT
KIT ROOM TURNOVER OR (KITS) ×3 IMPLANT
MATRIX SURGICAL PSM 5X5CM (Tissue) ×2 IMPLANT
MICROMATRIX 500MG (Tissue) ×3 IMPLANT
NDL HYPO 25GX1X1/2 BEV (NEEDLE) ×1 IMPLANT
NEEDLE HYPO 25GX1X1/2 BEV (NEEDLE) ×3 IMPLANT
NS IRRIG 1000ML POUR BTL (IV SOLUTION) ×3 IMPLANT
PACK GENERAL/GYN (CUSTOM PROCEDURE TRAY) ×3 IMPLANT
PACK ORTHO EXTREMITY (CUSTOM PROCEDURE TRAY) ×3 IMPLANT
PACK UNIVERSAL I (CUSTOM PROCEDURE TRAY) ×3 IMPLANT
PAD ARMBOARD 7.5X6 YLW CONV (MISCELLANEOUS) ×6 IMPLANT
SOLUTION PARTIC MCRMTRX 500MG (Tissue) IMPLANT
STAPLER VISISTAT 35W (STAPLE) ×3 IMPLANT
STOCKINETTE IMPERVIOUS 9X36 MD (GAUZE/BANDAGES/DRESSINGS) IMPLANT
STOCKINETTE IMPERVIOUS LG (DRAPES) ×2 IMPLANT
SURGILUBE 2OZ TUBE FLIPTOP (MISCELLANEOUS) ×2 IMPLANT
SUT MNCRL AB 4-0 PS2 18 (SUTURE) IMPLANT
SUT SILK 4 0 P 3 (SUTURE) IMPLANT
SUT VIC AB 5-0 PS2 18 (SUTURE) IMPLANT
SWAB COLLECTION DEVICE MRSA (MISCELLANEOUS) IMPLANT
SYR CONTROL 10ML LL (SYRINGE) ×3 IMPLANT
TOWEL OR 17X24 6PK STRL BLUE (TOWEL DISPOSABLE) ×3 IMPLANT
TOWEL OR 17X26 10 PK STRL BLUE (TOWEL DISPOSABLE) ×3 IMPLANT
TUBE ANAEROBIC SPECIMEN COL (MISCELLANEOUS) IMPLANT
TUBE CONNECTING 12'X1/4 (SUCTIONS) ×1
TUBE CONNECTING 12X1/4 (SUCTIONS) ×2 IMPLANT
UNDERPAD 30X30 (UNDERPADS AND DIAPERS) ×3 IMPLANT
YANKAUER SUCT BULB TIP NO VENT (SUCTIONS) ×3 IMPLANT

## 2016-09-20 NOTE — Anesthesia Procedure Notes (Signed)
Procedure Name: Intubation Date/Time: 09/20/2016 11:51 AM Performed by: Salli Quarry Dent Plantz Pre-anesthesia Checklist: Patient identified, Emergency Drugs available, Suction available and Patient being monitored Patient Re-evaluated:Patient Re-evaluated prior to inductionOxygen Delivery Method: Circle System Utilized Preoxygenation: Pre-oxygenation with 100% oxygen Intubation Type: IV induction Ventilation: Mask ventilation without difficulty Laryngoscope Size: Mac and 3 Grade View: Grade I Tube type: Oral Tube size: 7.0 mm Number of attempts: 1 Airway Equipment and Method: Stylet Placement Confirmation: ETT inserted through vocal cords under direct vision,  positive ETCO2 and breath sounds checked- equal and bilateral Secured at: 22 cm Tube secured with: Tape Dental Injury: Teeth and Oropharynx as per pre-operative assessment

## 2016-09-20 NOTE — Anesthesia Postprocedure Evaluation (Signed)
Anesthesia Post Note  Patient: Amanda Horton  Procedure(s) Performed: Procedure(s) (LRB): IRRIGATION AND DEBRIDEMENT right lower leg wound (Right) APPLICATION OF A-CELL OF right lower leg wound (Right) APPLICATION OF WOUND VAC right lower leg wound (Right)  Patient location during evaluation: PACU Anesthesia Type: General Level of consciousness: awake and alert Pain management: pain level controlled Vital Signs Assessment: post-procedure vital signs reviewed and stable Respiratory status: spontaneous breathing, nonlabored ventilation, respiratory function stable and patient connected to nasal cannula oxygen Cardiovascular status: blood pressure returned to baseline and stable Postop Assessment: no signs of nausea or vomiting Anesthetic complications: no    Last Vitals:  Vitals:   09/20/16 1301 09/20/16 1316  BP: 118/63 127/63  Pulse: 64 65  Resp: (!) 30 (!) 21  Temp:  36.2 C    Last Pain:  Vitals:   09/20/16 1035  TempSrc: Oral                 Breigh Annett,JAMES TERRILL

## 2016-09-20 NOTE — H&P (Signed)
Amanda Horton is an 70 y.o. female.   Chief Complaint: right leg wound HPI: Amanda Horton a 70 yo female with multiple medical problems who was seen while hospitalized following a fall in which she sustained a degloving injury to her rightlower leg. She had significant necrotic tissue about the skin flap and this was debrided in the OR approximately 30 x 10 cm and Acell and the VAC were applied in the OR.  2 weeks post op: The patient's VAC has remained in place and she has been tolerating it well.   Past Medical History:  Diagnosis Date  . Acute hyperkalemia   . Acute hypokalemia   . Acute on chronic renal failure ([place])   . Allergic rhinitis   . Anemia due to chronic kidney disease   . APC (atrial premature contractions)   . Arthritis of lumbar spine ([place])   . Benign essential hypertension   . Borderline diabetes   . Chronic insomnia   . Chronic venous insufficiency   . COPD (chronic obstructive pulmonary disease) ([place])   . GERD (gastroesophageal reflux disease)   . Hyperlipemia   . Hypoxia   . Lumbar disc narrowing   . OSA (obstructive sleep apnea)   . Osteoporosis   . Respiratory failure requiring intubation ([place])   . Spinal stenosis of lumbar region without neurogenic claudication   . Varicose veins with pain   . Vitamin B12 deficiency   . Vitamin D deficiency     Past Surgical History:  Procedure Laterality Date  . APPLICATION OF A-CELL OF EXTREMITY Right 08/30/2016   Procedure: APPLICATION OF A-CELL OF EXTREMITY;  Surgeon: Loel Lofty Zarahi Fuerst, DO;  Location: [place];  Service: Plastics;  Laterality: Right;  . APPLICATION OF WOUND VAC Right 08/30/2016   Procedure: APPLICATION OF WOUND VAC;  Surgeon: Loel Lofty Emalina Dubreuil, DO;  Location: [place];  Service: Plastics;  Laterality: Right;  . I&D EXTREMITY Right 08/30/2016   Procedure: IRRIGATION AND DEBRIDEMENT EXTREMITY RIGHT;  Surgeon: Loel Lofty Cliford Sequeira, DO;  Location: [place];  Service: Plastics;  Laterality: Right;  .  PARTIAL HYSTERECTOMY      Family History  Problem Relation Age of Onset  . Emphysema Father   . Asthma Father   . Throat cancer Mother    Social History:  reports that she quit smoking about 3 years ago. Her smoking use included Cigarettes. She has a 55.00 pack-year smoking history. She has never used smokeless tobacco. She reports that she does not drink alcohol or use drugs.  Allergies:  Allergies  Allergen Reactions  . No Known Allergies     No prescriptions prior to admission.    No results found for this or any previous visit (from the past 48 hour(s)). No results found.  Review of Systems  Constitutional: Negative.   HENT: Negative.   Eyes: Negative.   Respiratory: Negative.   Cardiovascular: Negative.   Gastrointestinal: Negative.   Genitourinary: Negative.   Musculoskeletal: Negative.   Skin: Negative.   Neurological: Negative.   Psychiatric/Behavioral: Negative.     There were no vitals taken for this visit. Physical Exam  Constitutional: She is oriented to person, place, and time. She appears well-developed and well-nourished.  HENT:  Head: Normocephalic and atraumatic.  Eyes: EOM are normal. Pupils are equal, round, and reactive to light.  Cardiovascular: Normal rate.   Respiratory: Effort normal.  GI: Soft. She exhibits no distension.  Neurological: She is alert and oriented to person, place, and time.  Skin: Skin  is warm.  Psychiatric: She has a normal mood and affect. Her behavior is normal. Judgment and thought content normal.     Assessment/Plan Debridement of right leg wound with Acell and VAC placement.  Wallace Going, DO 09/20/2016, 8:30 AM

## 2016-09-20 NOTE — Transfer of Care (Signed)
Immediate Anesthesia Transfer of Care Note  Patient: Amanda Horton  Procedure(s) Performed: Procedure(s): [place] right lower leg wound (Right) APPLICATION OF A-CELL OF right lower leg wound (Right) APPLICATION OF WOUND VAC right lower leg wound (Right)  Patient Location: PACU  Anesthesia Type:General  Level of Consciousness: awake, alert , oriented and patient cooperative  Airway & Oxygen Therapy: Patient Spontanous Breathing and Patient connected to nasal cannula oxygen  Post-op Assessment: Report given to RN and Post -op Vital signs reviewed and stable  Post vital signs: Reviewed and stable  Last Vitals:  Vitals:   09/20/16 1035 09/20/16 1052  BP: 120/62   Pulse: 78 79  Resp: 16   Temp: 36.7 C     Last Pain:  Vitals:   09/20/16 1035  TempSrc: Oral         Complications: No apparent anesthesia complications

## 2016-09-20 NOTE — Op Note (Addendum)
Operative Note   DATE OF OPERATION: 09/20/2016  LOCATION: [facility]  SURGICAL DIVISION: Plastic Surgery  PREOPERATIVE DIAGNOSES:  Right leg wound  POSTOPERATIVE DIAGNOSES:  same  PROCEDURE:   1.  Preparation of right leg wound 10 x 27 x .5 cm for placement of Acell (powder 500 mg and 5 x 5 cm)  SURGEON: Demetric Dunnaway Sanger Cara Aguino, DO  ASSISTANT: Shawn Rayburn, PA  ANESTHESIA:  General.   COMPLICATIONS: None.   INDICATIONS FOR PROCEDURE:  The patient, Amanda Horton is a 70 y.o. female born on 04-06-1946, is here for treatment of a large right leg wound after a fall 4 days ago.   MRN: [id number]  CONSENT:  Informed consent was obtained directly from the patient. Risks, benefits and alternatives were fully discussed. Specific risks including but not limited to bleeding, infection, hematoma, seroma, scarring, pain, infection, contracture, asymmetry, wound healing problems, and need for further surgery were all discussed. The patient did have an ample opportunity to have questions answered to satisfaction.   DESCRIPTION OF PROCEDURE:  The patient was taken to the operating room. SCD placed of left leg and IV antibiotics were given. The patient's operative site was prepped and draped in a sterile fashion. A time out was performed and all information was confirmed to be correct.  General anesthesia was administered.  The leg wound 10 x 27 cm was irrigated with saline and antibiotic solution.  The previously placed Acell was incorporating in the center.  All of the Acell powder and sheet were applied to the peripheral areas that had fully incorporated Acell.   The sorbact was placed and secured with staples.  The VAC was applied and there was an excellent seal.  The leg was wrapped with kerlex and an ace wrap.  The patient tolerated the procedure well.  There were no complications. The patient was allowed to wake from anesthesia, extubated and taken to the recovery room in  satisfactory condition.

## 2016-09-20 NOTE — Discharge Instructions (Signed)
Please change VAC weekly (or every 5 days) starting next Wednesday.

## 2016-09-20 NOTE — Brief Op Note (Signed)
09/20/2016  12:26 PM  PATIENT:  [name]  70 y.o. female  PRE-OPERATIVE DIAGNOSIS:  right lower leg wound  POST-OPERATIVE DIAGNOSIS:  right lower leg wound  PROCEDURE:  Procedure(s): IRRIGATION AND DEBRIDEMENT right lower leg wound (Right) APPLICATION OF A-CELL OF right lower leg wound (Right) APPLICATION OF WOUND VAC right lower leg wound (Right)  SURGEON:  Surgeon(s) and Role:    Loel Lofty Saryn Cherry, DO - Primary  PHYSICIAN ASSISTANT: Shawn Rayburn, PA  ASSISTANTS: none   ANESTHESIA:   general  EBL:  No intake/output data recorded.  BLOOD ADMINISTERED:none  DRAINS: none   LOCAL MEDICATIONS USED:  NONE  SPECIMEN:  No Specimen  DISPOSITION OF SPECIMEN:  N/A  COUNTS:  YES  TOURNIQUET:  * No tourniquets in log *  DICTATION: .Dragon Dictation  PLAN OF CARE: discharge to nursing facility  PATIENT DISPOSITION:  PACU - hemodynamically stable.   Delay start of Pharmacological VTE agent (>24hrs) due to surgical blood loss or risk of bleeding: no

## 2016-09-20 NOTE — Anesthesia Preprocedure Evaluation (Addendum)
Anesthesia Evaluation  Patient identified by MRN, date of birth, ID band Patient awake    Reviewed: Allergy & Precautions, NPO status , Patient's Chart, lab work & pertinent test results  History of Anesthesia Complications Negative for: history of anesthetic complications  Airway Mallampati: II  TM Distance: >3 FB Neck ROM: Full    Dental no notable dental hx. (+) Teeth Intact, Dental Advisory Given   Pulmonary sleep apnea and Continuous Positive Airway Pressure Ventilation , COPD,  COPD inhaler and oxygen dependent, former smoker,    Pulmonary exam normal  + decreased breath sounds+ wheezing      Cardiovascular hypertension, Pt. on medications and Pt. on home beta blockers + Peripheral Vascular Disease  Normal cardiovascular exam Rhythm:Regular Rate:Normal  ECHO from Sept noted. Mildly educed LV function   Neuro/Psych PSYCHIATRIC DISORDERS Depression negative neurological ROS     GI/Hepatic Neg liver ROS, GERD  Medicated,  Endo/Other  Morbid obesity  Renal/GU Renal InsufficiencyRenal diseasenegative Renal ROS     Musculoskeletal negative musculoskeletal ROS (+) Arthritis , Osteoarthritis,    Abdominal (+) + obese,   Peds  Hematology  (+) Blood dyscrasia, anemia ,   Anesthesia Other Findings   Reproductive/Obstetrics                           Anesthesia Physical  Anesthesia Plan  ASA: III  Anesthesia Plan: General   Post-op Pain Management:    Induction: Intravenous  Airway Management Planned: Oral ETT  Additional Equipment:   Intra-op Plan:   Post-operative Plan: Extubation in OR  Informed Consent: I have reviewed the patients History and Physical, chart, labs and discussed the procedure including the risks, benefits and alternatives for the proposed anesthesia with the patient or authorized representative who has indicated his/her understanding and acceptance.   Dental  advisory given  Plan Discussed with: CRNA  Anesthesia Plan Comments:         Anesthesia Quick Evaluation

## 2016-09-21 ENCOUNTER — Encounter (HOSPITAL_COMMUNITY): Payer: Self-pay | Admitting: Plastic Surgery

## 2016-09-23 DIAGNOSIS — S81801D Unspecified open wound, right lower leg, subsequent encounter: Secondary | ICD-10-CM | POA: Diagnosis not present

## 2016-09-23 DIAGNOSIS — I872 Venous insufficiency (chronic) (peripheral): Secondary | ICD-10-CM | POA: Diagnosis not present

## 2016-09-23 DIAGNOSIS — J9610 Chronic respiratory failure, unspecified whether with hypoxia or hypercapnia: Secondary | ICD-10-CM | POA: Diagnosis not present

## 2016-09-23 DIAGNOSIS — Z7982 Long term (current) use of aspirin: Secondary | ICD-10-CM | POA: Diagnosis not present

## 2016-09-23 DIAGNOSIS — S80922D Unspecified superficial injury of left lower leg, subsequent encounter: Secondary | ICD-10-CM | POA: Diagnosis not present

## 2016-09-23 DIAGNOSIS — Z9181 History of falling: Secondary | ICD-10-CM | POA: Diagnosis not present

## 2016-09-23 DIAGNOSIS — J441 Chronic obstructive pulmonary disease with (acute) exacerbation: Secondary | ICD-10-CM | POA: Diagnosis not present

## 2016-09-23 DIAGNOSIS — I129 Hypertensive chronic kidney disease with stage 1 through stage 4 chronic kidney disease, or unspecified chronic kidney disease: Secondary | ICD-10-CM | POA: Diagnosis not present

## 2016-09-23 DIAGNOSIS — N182 Chronic kidney disease, stage 2 (mild): Secondary | ICD-10-CM | POA: Diagnosis not present

## 2016-09-26 ENCOUNTER — Emergency Department (HOSPITAL_COMMUNITY): Payer: Commercial Managed Care - HMO

## 2016-09-26 ENCOUNTER — Encounter (HOSPITAL_COMMUNITY): Payer: Self-pay | Admitting: Emergency Medicine

## 2016-09-26 ENCOUNTER — Emergency Department (HOSPITAL_COMMUNITY)
Admission: EM | Admit: 2016-09-26 | Discharge: 2016-09-27 | Disposition: A | Discharge status: Home / Self Care | Payer: Commercial Managed Care - HMO | Attending: Emergency Medicine | Admitting: Emergency Medicine

## 2016-09-26 DIAGNOSIS — Z7982 Long term (current) use of aspirin: Secondary | ICD-10-CM | POA: Insufficient documentation

## 2016-09-26 DIAGNOSIS — Z9181 History of falling: Secondary | ICD-10-CM | POA: Diagnosis not present

## 2016-09-26 DIAGNOSIS — E872 Acidosis: Secondary | ICD-10-CM

## 2016-09-26 DIAGNOSIS — Z87891 Personal history of nicotine dependence: Secondary | ICD-10-CM | POA: Diagnosis not present

## 2016-09-26 DIAGNOSIS — Z79899 Other long term (current) drug therapy: Secondary | ICD-10-CM | POA: Insufficient documentation

## 2016-09-26 DIAGNOSIS — R410 Disorientation, unspecified: Secondary | ICD-10-CM | POA: Diagnosis not present

## 2016-09-26 DIAGNOSIS — I129 Hypertensive chronic kidney disease with stage 1 through stage 4 chronic kidney disease, or unspecified chronic kidney disease: Secondary | ICD-10-CM | POA: Diagnosis not present

## 2016-09-26 DIAGNOSIS — R0602 Shortness of breath: Secondary | ICD-10-CM | POA: Diagnosis not present

## 2016-09-26 DIAGNOSIS — R7303 Prediabetes: Secondary | ICD-10-CM | POA: Diagnosis not present

## 2016-09-26 DIAGNOSIS — J441 Chronic obstructive pulmonary disease with (acute) exacerbation: Secondary | ICD-10-CM | POA: Diagnosis not present

## 2016-09-26 DIAGNOSIS — J449 Chronic obstructive pulmonary disease, unspecified: Secondary | ICD-10-CM | POA: Insufficient documentation

## 2016-09-26 DIAGNOSIS — E8729 Other acidosis: Secondary | ICD-10-CM

## 2016-09-26 DIAGNOSIS — I10 Essential (primary) hypertension: Secondary | ICD-10-CM | POA: Insufficient documentation

## 2016-09-26 DIAGNOSIS — N182 Chronic kidney disease, stage 2 (mild): Secondary | ICD-10-CM | POA: Diagnosis not present

## 2016-09-26 DIAGNOSIS — J9610 Chronic respiratory failure, unspecified whether with hypoxia or hypercapnia: Secondary | ICD-10-CM | POA: Diagnosis not present

## 2016-09-26 DIAGNOSIS — R4182 Altered mental status, unspecified: Secondary | ICD-10-CM | POA: Diagnosis present

## 2016-09-26 DIAGNOSIS — S81802A Unspecified open wound, left lower leg, initial encounter: Secondary | ICD-10-CM | POA: Diagnosis not present

## 2016-09-26 DIAGNOSIS — S81801D Unspecified open wound, right lower leg, subsequent encounter: Secondary | ICD-10-CM | POA: Diagnosis not present

## 2016-09-26 DIAGNOSIS — I872 Venous insufficiency (chronic) (peripheral): Secondary | ICD-10-CM | POA: Diagnosis not present

## 2016-09-26 DIAGNOSIS — S80922D Unspecified superficial injury of left lower leg, subsequent encounter: Secondary | ICD-10-CM | POA: Diagnosis not present

## 2016-09-26 LAB — CBC WITH DIFFERENTIAL/PLATELET
Basophils Absolute: 0 10*3/uL (ref 0.0–0.1)
Basophils Relative: 0 %
EOS ABS: 0.1 10*3/uL (ref 0.0–0.7)
EOS PCT: 1 %
HCT: 33.6 % — ABNORMAL LOW (ref 36.0–46.0)
HEMOGLOBIN: 9.8 g/dL — AB (ref 12.0–15.0)
LYMPHS ABS: 1.4 10*3/uL (ref 0.7–4.0)
LYMPHS PCT: 15 %
MCH: 26.3 pg (ref 26.0–34.0)
MCHC: 29.2 g/dL — ABNORMAL LOW (ref 30.0–36.0)
MCV: 90.3 fL (ref 78.0–100.0)
Monocytes Absolute: 0.9 10*3/uL (ref 0.1–1.0)
Monocytes Relative: 10 %
Neutro Abs: 6.9 10*3/uL (ref 1.7–7.7)
Neutrophils Relative %: 74 %
PLATELETS: 258 10*3/uL (ref 150–400)
RBC: 3.72 MIL/uL — AB (ref 3.87–5.11)
RDW: 13.5 % (ref 11.5–15.5)
WBC: 9.3 10*3/uL (ref 4.0–10.5)

## 2016-09-26 LAB — COMPREHENSIVE METABOLIC PANEL
ALT: 14 U/L (ref 14–54)
AST: 20 U/L (ref 15–41)
Albumin: 3.3 g/dL — ABNORMAL LOW (ref 3.5–5.0)
Alkaline Phosphatase: 69 U/L (ref 38–126)
Anion gap: 8 (ref 5–15)
BUN: 10 mg/dL (ref 6–20)
CHLORIDE: 94 mmol/L — AB (ref 101–111)
CO2: 37 mmol/L — AB (ref 22–32)
CREATININE: 0.97 mg/dL (ref 0.44–1.00)
Calcium: 9.5 mg/dL (ref 8.9–10.3)
GFR calc non Af Amer: 58 mL/min — ABNORMAL LOW (ref >60)
Glucose, Bld: 155 mg/dL — ABNORMAL HIGH (ref 65–99)
Potassium: 3.7 mmol/L (ref 3.5–5.1)
SODIUM: 139 mmol/L (ref 135–145)
Total Bilirubin: 0.4 mg/dL (ref 0.3–1.2)
Total Protein: 6.3 g/dL — ABNORMAL LOW (ref 6.5–8.1)

## 2016-09-26 LAB — URINE MICROSCOPIC-ADD ON

## 2016-09-26 LAB — URINALYSIS, ROUTINE W REFLEX MICROSCOPIC
GLUCOSE, UA: NEGATIVE mg/dL
KETONES UR: NEGATIVE mg/dL
Nitrite: NEGATIVE
PH: 6 (ref 5.0–8.0)
Protein, ur: 30 mg/dL — AB
SPECIFIC GRAVITY, URINE: 1.024 (ref 1.005–1.030)

## 2016-09-26 NOTE — ED Triage Notes (Signed)
Family reported that they noticed pt.'s has been sleepy and intermittently confused yesterday , alert and oriented at triage , respirations unlabored , speech clear / no facial asymmetry , equal grips with no arm drift .

## 2016-09-27 DIAGNOSIS — S80922D Unspecified superficial injury of left lower leg, subsequent encounter: Secondary | ICD-10-CM | POA: Diagnosis not present

## 2016-09-27 DIAGNOSIS — I129 Hypertensive chronic kidney disease with stage 1 through stage 4 chronic kidney disease, or unspecified chronic kidney disease: Secondary | ICD-10-CM | POA: Diagnosis not present

## 2016-09-27 DIAGNOSIS — N182 Chronic kidney disease, stage 2 (mild): Secondary | ICD-10-CM | POA: Diagnosis not present

## 2016-09-27 DIAGNOSIS — Z9181 History of falling: Secondary | ICD-10-CM | POA: Diagnosis not present

## 2016-09-27 DIAGNOSIS — I872 Venous insufficiency (chronic) (peripheral): Secondary | ICD-10-CM | POA: Diagnosis not present

## 2016-09-27 DIAGNOSIS — Z7982 Long term (current) use of aspirin: Secondary | ICD-10-CM | POA: Diagnosis not present

## 2016-09-27 DIAGNOSIS — J9610 Chronic respiratory failure, unspecified whether with hypoxia or hypercapnia: Secondary | ICD-10-CM | POA: Diagnosis not present

## 2016-09-27 DIAGNOSIS — S81801D Unspecified open wound, right lower leg, subsequent encounter: Secondary | ICD-10-CM | POA: Diagnosis not present

## 2016-09-27 DIAGNOSIS — J441 Chronic obstructive pulmonary disease with (acute) exacerbation: Secondary | ICD-10-CM | POA: Diagnosis not present

## 2016-09-27 LAB — I-STAT ARTERIAL BLOOD GAS, ED
Acid-Base Excess: 13 mmol/L — ABNORMAL HIGH (ref 0.0–2.0)
Bicarbonate: 41.2 mmol/L — ABNORMAL HIGH (ref 20.0–28.0)
O2 SAT: 99 %
PCO2 ART: 71.1 mmHg — AB (ref 32.0–48.0)
PO2 ART: 130 mmHg — AB (ref 83.0–108.0)
Patient temperature: 98.6
TCO2: 43 mmol/L (ref 0–100)
pH, Arterial: 7.372 (ref 7.350–7.450)

## 2016-09-27 MED ORDER — IPRATROPIUM-ALBUTEROL 0.5-2.5 (3) MG/3ML IN SOLN
3.0000 mL | Freq: Once | RESPIRATORY_TRACT | Status: AC
  Administered 2016-09-27: 3 mL via RESPIRATORY_TRACT
  Filled 2016-09-27: qty 3

## 2016-09-27 NOTE — Discharge Instructions (Signed)
Your mother was seen today for delirium. This is likely related to her recent surgery and prolonged hospital and rehabilitation stay. She also has compensated respiratory acidosis which can cause some sleepiness. You need to ensure that she wears her BiPAP. Avoid sedating medications. If she has any new or worsening symptoms she needs to be reevaluated.  After history, exam, and medical workup I feel the patient has been appropriately medically screened and is safe for discharge home. Pertinent diagnoses were discussed with the patient. Patient was given return precautions.

## 2016-09-27 NOTE — ED Provider Notes (Signed)
[facility] DEPT Provider Note   CSN: [id number] Arrival date & time: 09/26/16  2001  By signing my name below, I, [name], attest that this documentation has been prepared under the direction and in the presence of Merryl Hacker, MD  Electronically Signed: [name], ED Scribe. 09/27/16. 12:38 AM.   History   Chief Complaint Chief Complaint  Patient presents with  . Altered Mental Status  . Somnolence    The history is provided by the patient and a relative. No language interpreter was used.   HPI Comments:  Amanda Horton is a 70 y.o. female who presents to the Emergency Department complaining of somnolence x 1 day. Pt's daughter notes pt has been talking and doing various actions in her sleep. She is awake, alert and oriented but falls asleep easily and appears to pick at things and say things that don't make sense. Pt is not aware of what she is saying in her sleep. She also notes SOB At baseline. She is on home oxygen. Pt denies vision changes, weakness, tingling, numbness, fevers, and recent illnesses. She has been prescribed oxycodone but has not been taking them. Pt has been taking Dulera for about 3 weeks. No alleviating factors noted. Pt denies any other complaints at this time.    Of note, patient recently had multiple surgeries for a wound over her right lower extremity secondary to trauma. She has been in rehabilitation for 21 days. Her somnolence and picking behaviors began in rehabilitation. She was seen by her plastic surgeon this morning and her wound VAC was replaced with an impregnated dressing.  Past Medical History:  Diagnosis Date  . Acute hyperkalemia   . Acute hypokalemia   . Acute on chronic renal failure ([place])   . Allergic rhinitis   . Anemia due to chronic kidney disease   . APC (atrial premature contractions)   . Arthritis of lumbar spine ([place])   . Benign essential hypertension   . Borderline diabetes   . Chronic insomnia   . Chronic  venous insufficiency   . COPD (chronic obstructive pulmonary disease) ([place])   . GERD (gastroesophageal reflux disease)   . Hyperlipemia   . Hypoxia   . Lumbar disc narrowing   . On home oxygen therapy   . OSA (obstructive sleep apnea)   . Osteoporosis   . Respiratory failure requiring intubation ([place])   . Spinal stenosis of lumbar region without neurogenic claudication   . Varicose veins with pain   . Vitamin B12 deficiency   . Vitamin D deficiency     Patient Active Problem List   Diagnosis Date Noted  . Degloving injury 08/27/2016  . Depression 08/27/2016  . Essential hypertension 08/27/2016  . Osteoporosis 08/27/2016  . AKI (acute kidney injury) ([place]) 08/27/2016  . Degloving injury of lower leg   . Fall   . Vitamin B12 deficiency   . Borderline diabetes   . Hyperlipemia   . GERD (gastroesophageal reflux disease)   . COPD (chronic obstructive pulmonary disease) ([place])   . OSA (obstructive sleep apnea)   . Varicose veins with pain   . Chronic venous insufficiency     Past Surgical History:  Procedure Laterality Date  . APPLICATION OF A-CELL OF EXTREMITY Right 08/30/2016   Procedure: APPLICATION OF A-CELL OF EXTREMITY;  Surgeon: Loel Lofty Dillingham, DO;  Location: [facility];  Service: Plastics;  Laterality: Right;  . APPLICATION OF A-CELL OF EXTREMITY Right 09/20/2016   Procedure: APPLICATION OF A-CELL OF right  lower leg wound;  Surgeon: Loel Lofty Dillingham, DO;  Location: [facility];  Service: Plastics;  Laterality: Right;  . APPLICATION OF WOUND VAC Right 08/30/2016   Procedure: APPLICATION OF WOUND VAC;  Surgeon: Loel Lofty Dillingham, DO;  Location: [facility];  Service: Plastics;  Laterality: Right;  . APPLICATION OF WOUND VAC Right 09/20/2016   Procedure: APPLICATION OF WOUND VAC right lower leg wound;  Surgeon: Loel Lofty Dillingham, DO;  Location: [facility];  Service: Plastics;  Laterality: Right;  . I&D EXTREMITY Right 08/30/2016   Procedure: IRRIGATION AND DEBRIDEMENT EXTREMITY RIGHT;   Surgeon: Loel Lofty Dillingham, DO;  Location: [facility];  Service: Plastics;  Laterality: Right;  . INCISION AND DRAINAGE OF WOUND Right 09/20/2016   Procedure: IRRIGATION AND DEBRIDEMENT right lower leg wound;  Surgeon: Loel Lofty Dillingham, DO;  Location: [facility];  Service: Plastics;  Laterality: Right;  . PARTIAL HYSTERECTOMY      OB History    No data available       Home Medications    Prior to Admission medications   Medication Sig Start Date End Date Taking? Authorizing Provider  albuterol (PROVENTIL HFA;VENTOLIN HFA) 108 (90 BASE) MCG/ACT inhaler Inhale 2 puffs into the lungs every 6 (six) hours as needed for wheezing or shortness of breath.   Yes Historical Provider, MD  albuterol (PROVENTIL) (2.5 MG/3ML) 0.083% nebulizer solution Take 3 mLs (2.5 mg total) by nebulization every 6 (six) hours as needed for wheezing or shortness of breath. 05/04/15  Yes Elsie Stain, MD  alendronate (FOSAMAX) 70 MG tablet Take 70 mg by mouth once a week. Take with a full glass of water on an empty stomach.   Yes Historical Provider, MD  aspirin 81 MG tablet Take 81 mg by mouth daily.   Yes Historical Provider, MD  Calcium Carbonate-Vitamin D 600-400 MG-UNIT per tablet Take 1 tablet by mouth 2 (two) times daily.   Yes Historical Provider, MD  Cholecalciferol (VITAMIN D PO) Take 5,000 Units by mouth daily.   Yes Historical Provider, MD  cyanocobalamin 500 MCG tablet Take 500 mcg by mouth daily.   Yes Historical Provider, MD  escitalopram (LEXAPRO) 20 MG tablet Take 20 mg by mouth at bedtime.   Yes Historical Provider, MD  furosemide (LASIX) 80 MG tablet Take 1 tablet by mouth daily. 11/06/14  Yes Historical Provider, MD  guaiFENesin (MUCINEX) 600 MG 12 hr tablet Take 600 mg by mouth 2 (two) times daily.   Yes Historical Provider, MD  ipratropium-albuterol (DUONEB) 0.5-2.5 (3) MG/3ML SOLN Take 3 mLs by nebulization every 4 (four) hours as needed (for breathing).   Yes Historical Provider, MD  linaclotide  (LINZESS) 145 MCG CAPS capsule Take 145 mcg by mouth daily before breakfast.   Yes Historical Provider, MD  LORazepam (ATIVAN) 1 MG tablet Take 1 tablet (1 mg total) by mouth every 8 (eight) hours as needed for anxiety. 09/01/16  Yes Verlee Monte, MD  metoprolol tartrate (LOPRESSOR) 25 MG tablet Take 1 tablet by mouth 2 (two) times daily. 11/06/14  Yes Historical Provider, MD  mometasone-formoterol (DULERA) 200-5 MCG/ACT AERO Inhale 2 puffs into the lungs 2 (two) times daily.   Yes Historical Provider, MD  omeprazole (PRILOSEC) 40 MG capsule Take 1 capsule by mouth daily. 11/06/14  Yes Historical Provider, MD  potassium chloride SA (K-DUR,KLOR-CON) 20 MEQ tablet Take 1 tablet by mouth 2 (two) times daily. 11/06/14  Yes Historical Provider, MD  ranitidine (ZANTAC) 150 MG tablet Take 1 tablet by mouth  2 (two) times daily. 11/06/14  Yes Historical Provider, MD  temazepam (RESTORIL) 15 MG capsule Take 1 capsule (15 mg total) by mouth at bedtime as needed for sleep. 09/01/16  Yes Verlee Monte, MD  Tiotropium Bromide Monohydrate 2.5 MCG/ACT AERS Inhale 2 puffs into the lungs daily.   Yes Historical Provider, MD  Vitamin D, Ergocalciferol, (DRISDOL) 50000 UNITS CAPS capsule Take 1 capsule by mouth every 7 (seven) days. 11/06/14  Yes Historical Provider, MD  ACCU-CHEK FASTCLIX LANCETS [place]  02/16/15   Historical Provider, MD  ACCU-CHEK SMARTVIEW test strip  02/16/15   Historical Provider, MD  oxyCODONE-acetaminophen (PERCOCET/ROXICET) 5-325 MG tablet Take 1 tablet by mouth every 6 (six) hours as needed for moderate pain. 09/01/16   Verlee Monte, MD    Family History Family History  Problem Relation Age of Onset  . Emphysema Father   . Asthma Father   . Throat cancer Mother     Social History Social History  Substance Use Topics  . Smoking status: Former Smoker    Packs/day: 1.00    Years: 55.00    Types: Cigarettes    Quit date: 12/04/2012  . Smokeless tobacco: Never Used     Comment: Stopped in 2014  .  Alcohol use No     Allergies   No known allergies   Review of Systems Review of Systems  Constitutional: Negative for fever.  Eyes: Negative for visual disturbance.  Respiratory: Positive for shortness of breath.   Cardiovascular: Negative for chest pain.  Gastrointestinal: Negative for abdominal pain.  Genitourinary: Negative for dysuria.  Skin: Positive for wound.  Neurological: Negative for weakness and numbness.  All other systems reviewed and are negative.    Physical Exam Updated Vital Signs BP 110/78 (BP Location: Right Arm)   Pulse 89   Temp 98.2 F (36.8 C) (Oral)   Resp 18   Ht 5\' 2"  (1.575 m)   Wt 240 lb (108.9 kg)   SpO2 97%   BMI 43.90 kg/m   Physical Exam  Constitutional: She is oriented to person, place, and time.  Obese  HENT:  Head: Normocephalic and atraumatic.  Cardiovascular: Normal rate, regular rhythm and normal heart sounds.   No murmur heard. Pulmonary/Chest: Effort normal. No respiratory distress. She has wheezes.  Abdominal: Soft. Bowel sounds are normal. There is no tenderness. There is no guarding.  Neurological: She is alert and oriented to person, place, and time.  Awake, alert, oriented 3, cranial nerves II through XII intact, 5 out of 5 strength in all 4 extremities  Skin: Skin is warm and dry.  Large wound right lateral calf with impregnated dressing applied, adjacent erythema noted 1+ DP pulse  Psychiatric: She has a normal mood and affect.  Nursing note and vitals reviewed.    ED Treatments / Results  DIAGNOSTIC STUDIES:  Oxygen Saturation is 99% on [place], normal by my interpretation.    COORDINATION OF CARE:  12:18 AM Discussed treatment plan with pt at bedside and pt agreed to plan.  Labs (all labs ordered are listed, but only abnormal results are displayed) Labs Reviewed  CBC WITH DIFFERENTIAL/PLATELET - Abnormal; Notable for the following:       Result Value   RBC 3.72 (*)    Hemoglobin 9.8 (*)    HCT 33.6 (*)      MCHC 29.2 (*)    All other components within normal limits  COMPREHENSIVE METABOLIC PANEL - Abnormal; Notable for the following:    Chloride 94 (*)  CO2 37 (*)    Glucose, Bld 155 (*)    Total Protein 6.3 (*)    Albumin 3.3 (*)    GFR calc non Af Amer 58 (*)    All other components within normal limits  URINALYSIS, ROUTINE W REFLEX MICROSCOPIC (NOT AT [facility]) - Abnormal; Notable for the following:    Color, Urine AMBER (*)    APPearance CLOUDY (*)    Hgb urine dipstick LARGE (*)    Bilirubin Urine SMALL (*)    Protein, ur 30 (*)    Leukocytes, UA SMALL (*)    All other components within normal limits  URINE MICROSCOPIC-ADD ON - Abnormal; Notable for the following:    Squamous Epithelial / LPF 0-5 (*)    Bacteria, UA RARE (*)    Crystals CA OXALATE CRYSTALS (*)    All other components within normal limits  I-STAT ARTERIAL BLOOD GAS, ED - Abnormal; Notable for the following:    pCO2 arterial 71.1 (*)    pO2, Arterial 130.0 (*)    Bicarbonate 41.2 (*)    Acid-Base Excess 13.0 (*)    All other components within normal limits    EKG  EKG Interpretation  Date/Time:  Tuesday September 26 2016 20:07:13 EDT Ventricular Rate:  93 PR Interval:  140 QRS Duration: 122 QT Interval:  396 QTC Calculation: 492 R Axis:   65 Text Interpretation:  Normal sinus rhythm Left bundle branch block Abnormal ECG Confirmed by HORTON  MD, COURTNEY ([id number]) on 09/26/2016 11:33:33 PM       Radiology Dg Chest 2 View  Result Date: 09/26/2016 CLINICAL DATA:  Altered mental status, chronic shortness of breath EXAM: CHEST  2 VIEW COMPARISON:  08/27/2016 FINDINGS: There is no focal infiltrate or significant pleural effusion. Stable mild to moderate enlargement of the cardiomediastinal none. No overt edema. No pneumothorax. IMPRESSION: 1. Stable mild to moderate cardiomegaly without overt failure. 2. No acute infiltrates Electronically Signed   By: Donavan Foil M.D.   On: 09/26/2016 23:56     Procedures Procedures (including critical care time)  Medications Ordered in ED Medications  ipratropium-albuterol (DUONEB) 0.5-2.5 (3) MG/3ML nebulizer solution 3 mL (3 mLs Nebulization Given 09/27/16 0034)     Initial Impression / Assessment and Plan / ED Course  I have reviewed the triage vital signs and the nursing notes.  Pertinent labs & imaging results that were available during my care of the patient were reviewed by me and considered in my medical decision making (see chart for details).  Clinical Course    Patient presents with her daughter with concerns for confusion, somnolence, and talking out of her head. History is most suggestive of delirium. Patient is currently awake, alert, and oriented. She is able to provide history. She was noted when she slepted to be very restless and would mumble. Her lab work is all reassuring including urinalysis. No signs or symptoms of infection. She does have this chronic wound but this is being managed by plastic surgery and she was seen earlier today for this. ABG was obtained given her history of chronic respiratory disease. She has a compensated respiratory acidosis. I have no prior for comparison. However she does appear appropriately compensated. I discussed at length with the daughter that her delirium is likely multifactorial including medications, recent illness and hospitalization with rehabilitation, chronic respiratory failure. Daughter also states that she has her days and nights mixed up and has not warm her CPAP. I stressed the daughter that she needs  a normalized schedule and needs to wear her CPAP at night.  She may have a small component of CO2 retention but she again was awake, alert, and oriented during my evaluation without significant somnolence. Feel her ABG is reassuring.  Daughter was encouraged to follow-up with primary physician by Friday for recheck. If patient develops fevers, worsening delirium, worsening somnolence  or any new or worsening symptoms she needs to be reevaluated immediately.  Daughter is comfortable with plan.  After history, exam, and medical workup I feel the patient has been appropriately medically screened and is safe for discharge home. Pertinent diagnoses were discussed with the patient. Patient was given return precautions.   Final Clinical Impressions(s) / ED Diagnoses   Final diagnoses:  Delirium  Compensated respiratory acidosis    New Prescriptions Discharge Medication List as of 09/27/2016  1:54 AM     I personally performed the services described in this documentation, which was scribed in my presence. The recorded information has been reviewed and is accurate.     Merryl Hacker, MD 09/27/16 [phone]

## 2016-09-27 NOTE — ED Notes (Signed)
Patient and family verbalize understanding of discharge instructions, home care and follow up care. Patient out of department at this time. 

## 2016-09-28 DIAGNOSIS — Z7982 Long term (current) use of aspirin: Secondary | ICD-10-CM | POA: Diagnosis not present

## 2016-09-28 DIAGNOSIS — Z9181 History of falling: Secondary | ICD-10-CM | POA: Diagnosis not present

## 2016-09-28 DIAGNOSIS — S81801D Unspecified open wound, right lower leg, subsequent encounter: Secondary | ICD-10-CM | POA: Diagnosis not present

## 2016-09-28 DIAGNOSIS — I129 Hypertensive chronic kidney disease with stage 1 through stage 4 chronic kidney disease, or unspecified chronic kidney disease: Secondary | ICD-10-CM | POA: Diagnosis not present

## 2016-09-28 DIAGNOSIS — I872 Venous insufficiency (chronic) (peripheral): Secondary | ICD-10-CM | POA: Diagnosis not present

## 2016-09-28 DIAGNOSIS — J441 Chronic obstructive pulmonary disease with (acute) exacerbation: Secondary | ICD-10-CM | POA: Diagnosis not present

## 2016-09-28 DIAGNOSIS — N182 Chronic kidney disease, stage 2 (mild): Secondary | ICD-10-CM | POA: Diagnosis not present

## 2016-09-28 DIAGNOSIS — J9610 Chronic respiratory failure, unspecified whether with hypoxia or hypercapnia: Secondary | ICD-10-CM | POA: Diagnosis not present

## 2016-09-28 DIAGNOSIS — S80922D Unspecified superficial injury of left lower leg, subsequent encounter: Secondary | ICD-10-CM | POA: Diagnosis not present

## 2016-09-30 DIAGNOSIS — J441 Chronic obstructive pulmonary disease with (acute) exacerbation: Secondary | ICD-10-CM | POA: Diagnosis not present

## 2016-09-30 DIAGNOSIS — I872 Venous insufficiency (chronic) (peripheral): Secondary | ICD-10-CM | POA: Diagnosis not present

## 2016-09-30 DIAGNOSIS — S80922D Unspecified superficial injury of left lower leg, subsequent encounter: Secondary | ICD-10-CM | POA: Diagnosis not present

## 2016-09-30 DIAGNOSIS — Z7982 Long term (current) use of aspirin: Secondary | ICD-10-CM | POA: Diagnosis not present

## 2016-09-30 DIAGNOSIS — Z9181 History of falling: Secondary | ICD-10-CM | POA: Diagnosis not present

## 2016-09-30 DIAGNOSIS — N182 Chronic kidney disease, stage 2 (mild): Secondary | ICD-10-CM | POA: Diagnosis not present

## 2016-09-30 DIAGNOSIS — S81801D Unspecified open wound, right lower leg, subsequent encounter: Secondary | ICD-10-CM | POA: Diagnosis not present

## 2016-09-30 DIAGNOSIS — J9610 Chronic respiratory failure, unspecified whether with hypoxia or hypercapnia: Secondary | ICD-10-CM | POA: Diagnosis not present

## 2016-09-30 DIAGNOSIS — I129 Hypertensive chronic kidney disease with stage 1 through stage 4 chronic kidney disease, or unspecified chronic kidney disease: Secondary | ICD-10-CM | POA: Diagnosis not present

## 2016-10-02 ENCOUNTER — Other Ambulatory Visit: Payer: Self-pay

## 2016-10-02 NOTE — Patient Outreach (Signed)
[facility]) Care Management  10/02/2016  Amanda Horton 1946/03/24 [id number]   REFERRAL DATE; 09/29/16  REFERRAL SOURCE: [facility]/  [employer] REFERRAL REASON: Status post admission, discharge date 09/01/16 1 admission, 1 surgical procedure, and 1 emergency room visit within the last month with history of multiple falls.  Non- compliance with CPAP usage.  Has not made follow up appointment following emergency room discharge.   Telephone call to patient regarding [facility] referral. Unable to reach patient. HIPAA compliant voice message left with call back phone number.  PLAN; RNCM will attempt 2nd telephone outreach to patient within  1 week.   Quinn Plowman RN,BSN,CCM [facility] Telephonic  [phone]

## 2016-10-03 ENCOUNTER — Other Ambulatory Visit: Payer: Self-pay

## 2016-10-03 DIAGNOSIS — S81801D Unspecified open wound, right lower leg, subsequent encounter: Secondary | ICD-10-CM | POA: Diagnosis not present

## 2016-10-03 DIAGNOSIS — S80922D Unspecified superficial injury of left lower leg, subsequent encounter: Secondary | ICD-10-CM | POA: Diagnosis not present

## 2016-10-03 DIAGNOSIS — Z9181 History of falling: Secondary | ICD-10-CM | POA: Diagnosis not present

## 2016-10-03 DIAGNOSIS — Z7982 Long term (current) use of aspirin: Secondary | ICD-10-CM | POA: Diagnosis not present

## 2016-10-03 DIAGNOSIS — N182 Chronic kidney disease, stage 2 (mild): Secondary | ICD-10-CM | POA: Diagnosis not present

## 2016-10-03 DIAGNOSIS — J9610 Chronic respiratory failure, unspecified whether with hypoxia or hypercapnia: Secondary | ICD-10-CM | POA: Diagnosis not present

## 2016-10-03 DIAGNOSIS — J440 Chronic obstructive pulmonary disease with acute lower respiratory infection: Secondary | ICD-10-CM | POA: Diagnosis not present

## 2016-10-03 DIAGNOSIS — Z6841 Body Mass Index (BMI) 40.0 and over, adult: Secondary | ICD-10-CM | POA: Diagnosis not present

## 2016-10-03 DIAGNOSIS — Z1389 Encounter for screening for other disorder: Secondary | ICD-10-CM | POA: Diagnosis not present

## 2016-10-03 DIAGNOSIS — I129 Hypertensive chronic kidney disease with stage 1 through stage 4 chronic kidney disease, or unspecified chronic kidney disease: Secondary | ICD-10-CM | POA: Diagnosis not present

## 2016-10-03 DIAGNOSIS — I872 Venous insufficiency (chronic) (peripheral): Secondary | ICD-10-CM | POA: Diagnosis not present

## 2016-10-03 DIAGNOSIS — J441 Chronic obstructive pulmonary disease with (acute) exacerbation: Secondary | ICD-10-CM | POA: Diagnosis not present

## 2016-10-03 NOTE — Patient Outreach (Addendum)
[facility]) Care Management  10/03/2016  Amanda Horton 05-15-1946 [id number]  REFERRAL DATE; 09/29/16   REFERRAL SOURCE: [facility]/  [employer] REFERRAL REASON: Status post admission, discharge date 09/01/16 1 admission, 1 surgical procedure, and 1 emergency room visit within the last month with history of multiple falls.  Non- compliance with CPAP usage.  Has not made follow up appointment following emergency room discharge.   Second telephone call to patient regarding [facility] referral.  Person answering phone states patient is taking a breathing treatment. Request call back.   Received return call from patient:  HIPAA verified with patient.  Discussed and offered [facility] care management services with patient.  Patient states she currently has a home health nurse seeing her 2 times per week.  States she does not want another service at this time. Patient verbally agreed to receive [facility] care management outreach letter and brochure for future reference.   RNCM contacted patients primary MD office.  Spoke with Crystal and notified her of patients refusal of [facility] care management services. Requested assistance with engaging patient to [facility] care management services.   PLAN:  RNCM will refer patient to care management assistant to close patient due to refusal of services.  RNCM will send patient [facility] care management outreach letter and brochure as requested.    Quinn Plowman RN,BSN,CCM [facility] Telephonic  [phone]

## 2016-10-04 DIAGNOSIS — Z9181 History of falling: Secondary | ICD-10-CM | POA: Diagnosis not present

## 2016-10-04 DIAGNOSIS — J441 Chronic obstructive pulmonary disease with (acute) exacerbation: Secondary | ICD-10-CM | POA: Diagnosis not present

## 2016-10-04 DIAGNOSIS — J9610 Chronic respiratory failure, unspecified whether with hypoxia or hypercapnia: Secondary | ICD-10-CM | POA: Diagnosis not present

## 2016-10-04 DIAGNOSIS — S80922D Unspecified superficial injury of left lower leg, subsequent encounter: Secondary | ICD-10-CM | POA: Diagnosis not present

## 2016-10-04 DIAGNOSIS — N182 Chronic kidney disease, stage 2 (mild): Secondary | ICD-10-CM | POA: Diagnosis not present

## 2016-10-04 DIAGNOSIS — I129 Hypertensive chronic kidney disease with stage 1 through stage 4 chronic kidney disease, or unspecified chronic kidney disease: Secondary | ICD-10-CM | POA: Diagnosis not present

## 2016-10-04 DIAGNOSIS — Z7982 Long term (current) use of aspirin: Secondary | ICD-10-CM | POA: Diagnosis not present

## 2016-10-04 DIAGNOSIS — I872 Venous insufficiency (chronic) (peripheral): Secondary | ICD-10-CM | POA: Diagnosis not present

## 2016-10-04 DIAGNOSIS — S81801D Unspecified open wound, right lower leg, subsequent encounter: Secondary | ICD-10-CM | POA: Diagnosis not present

## 2016-10-05 ENCOUNTER — Ambulatory Visit: Payer: Self-pay

## 2016-10-09 DIAGNOSIS — S81801D Unspecified open wound, right lower leg, subsequent encounter: Secondary | ICD-10-CM | POA: Diagnosis not present

## 2016-10-09 DIAGNOSIS — I129 Hypertensive chronic kidney disease with stage 1 through stage 4 chronic kidney disease, or unspecified chronic kidney disease: Secondary | ICD-10-CM | POA: Diagnosis not present

## 2016-10-09 DIAGNOSIS — J441 Chronic obstructive pulmonary disease with (acute) exacerbation: Secondary | ICD-10-CM | POA: Diagnosis not present

## 2016-10-09 DIAGNOSIS — J9610 Chronic respiratory failure, unspecified whether with hypoxia or hypercapnia: Secondary | ICD-10-CM | POA: Diagnosis not present

## 2016-10-09 DIAGNOSIS — I872 Venous insufficiency (chronic) (peripheral): Secondary | ICD-10-CM | POA: Diagnosis not present

## 2016-10-09 DIAGNOSIS — N182 Chronic kidney disease, stage 2 (mild): Secondary | ICD-10-CM | POA: Diagnosis not present

## 2016-10-09 DIAGNOSIS — S80922D Unspecified superficial injury of left lower leg, subsequent encounter: Secondary | ICD-10-CM | POA: Diagnosis not present

## 2016-10-09 DIAGNOSIS — Z7982 Long term (current) use of aspirin: Secondary | ICD-10-CM | POA: Diagnosis not present

## 2016-10-09 DIAGNOSIS — Z9181 History of falling: Secondary | ICD-10-CM | POA: Diagnosis not present

## 2016-10-10 DIAGNOSIS — S80922D Unspecified superficial injury of left lower leg, subsequent encounter: Secondary | ICD-10-CM | POA: Diagnosis not present

## 2016-10-10 DIAGNOSIS — J9610 Chronic respiratory failure, unspecified whether with hypoxia or hypercapnia: Secondary | ICD-10-CM | POA: Diagnosis not present

## 2016-10-10 DIAGNOSIS — Z9181 History of falling: Secondary | ICD-10-CM | POA: Diagnosis not present

## 2016-10-10 DIAGNOSIS — S81801D Unspecified open wound, right lower leg, subsequent encounter: Secondary | ICD-10-CM | POA: Diagnosis not present

## 2016-10-10 DIAGNOSIS — J441 Chronic obstructive pulmonary disease with (acute) exacerbation: Secondary | ICD-10-CM | POA: Diagnosis not present

## 2016-10-10 DIAGNOSIS — I129 Hypertensive chronic kidney disease with stage 1 through stage 4 chronic kidney disease, or unspecified chronic kidney disease: Secondary | ICD-10-CM | POA: Diagnosis not present

## 2016-10-10 DIAGNOSIS — Z7982 Long term (current) use of aspirin: Secondary | ICD-10-CM | POA: Diagnosis not present

## 2016-10-10 DIAGNOSIS — I872 Venous insufficiency (chronic) (peripheral): Secondary | ICD-10-CM | POA: Diagnosis not present

## 2016-10-10 DIAGNOSIS — N182 Chronic kidney disease, stage 2 (mild): Secondary | ICD-10-CM | POA: Diagnosis not present

## 2016-10-12 DIAGNOSIS — I129 Hypertensive chronic kidney disease with stage 1 through stage 4 chronic kidney disease, or unspecified chronic kidney disease: Secondary | ICD-10-CM | POA: Diagnosis not present

## 2016-10-12 DIAGNOSIS — S80922D Unspecified superficial injury of left lower leg, subsequent encounter: Secondary | ICD-10-CM | POA: Diagnosis not present

## 2016-10-12 DIAGNOSIS — I872 Venous insufficiency (chronic) (peripheral): Secondary | ICD-10-CM | POA: Diagnosis not present

## 2016-10-12 DIAGNOSIS — J9610 Chronic respiratory failure, unspecified whether with hypoxia or hypercapnia: Secondary | ICD-10-CM | POA: Diagnosis not present

## 2016-10-12 DIAGNOSIS — Z9181 History of falling: Secondary | ICD-10-CM | POA: Diagnosis not present

## 2016-10-12 DIAGNOSIS — J441 Chronic obstructive pulmonary disease with (acute) exacerbation: Secondary | ICD-10-CM | POA: Diagnosis not present

## 2016-10-12 DIAGNOSIS — S81801D Unspecified open wound, right lower leg, subsequent encounter: Secondary | ICD-10-CM | POA: Diagnosis not present

## 2016-10-12 DIAGNOSIS — Z7982 Long term (current) use of aspirin: Secondary | ICD-10-CM | POA: Diagnosis not present

## 2016-10-12 DIAGNOSIS — N182 Chronic kidney disease, stage 2 (mild): Secondary | ICD-10-CM | POA: Diagnosis not present

## 2016-10-13 DIAGNOSIS — I129 Hypertensive chronic kidney disease with stage 1 through stage 4 chronic kidney disease, or unspecified chronic kidney disease: Secondary | ICD-10-CM | POA: Diagnosis not present

## 2016-10-13 DIAGNOSIS — J9610 Chronic respiratory failure, unspecified whether with hypoxia or hypercapnia: Secondary | ICD-10-CM | POA: Diagnosis not present

## 2016-10-13 DIAGNOSIS — Z9181 History of falling: Secondary | ICD-10-CM | POA: Diagnosis not present

## 2016-10-13 DIAGNOSIS — S81801D Unspecified open wound, right lower leg, subsequent encounter: Secondary | ICD-10-CM | POA: Diagnosis not present

## 2016-10-13 DIAGNOSIS — J441 Chronic obstructive pulmonary disease with (acute) exacerbation: Secondary | ICD-10-CM | POA: Diagnosis not present

## 2016-10-13 DIAGNOSIS — S80922D Unspecified superficial injury of left lower leg, subsequent encounter: Secondary | ICD-10-CM | POA: Diagnosis not present

## 2016-10-13 DIAGNOSIS — N182 Chronic kidney disease, stage 2 (mild): Secondary | ICD-10-CM | POA: Diagnosis not present

## 2016-10-13 DIAGNOSIS — I872 Venous insufficiency (chronic) (peripheral): Secondary | ICD-10-CM | POA: Diagnosis not present

## 2016-10-13 DIAGNOSIS — Z7982 Long term (current) use of aspirin: Secondary | ICD-10-CM | POA: Diagnosis not present

## 2016-10-16 DIAGNOSIS — J449 Chronic obstructive pulmonary disease, unspecified: Secondary | ICD-10-CM | POA: Diagnosis not present

## 2016-10-16 DIAGNOSIS — S81801D Unspecified open wound, right lower leg, subsequent encounter: Secondary | ICD-10-CM | POA: Diagnosis not present

## 2016-10-16 DIAGNOSIS — R7303 Prediabetes: Secondary | ICD-10-CM | POA: Diagnosis not present

## 2016-10-17 DIAGNOSIS — I872 Venous insufficiency (chronic) (peripheral): Secondary | ICD-10-CM | POA: Diagnosis not present

## 2016-10-17 DIAGNOSIS — Z9181 History of falling: Secondary | ICD-10-CM | POA: Diagnosis not present

## 2016-10-17 DIAGNOSIS — J9610 Chronic respiratory failure, unspecified whether with hypoxia or hypercapnia: Secondary | ICD-10-CM | POA: Diagnosis not present

## 2016-10-17 DIAGNOSIS — N182 Chronic kidney disease, stage 2 (mild): Secondary | ICD-10-CM | POA: Diagnosis not present

## 2016-10-17 DIAGNOSIS — S81801D Unspecified open wound, right lower leg, subsequent encounter: Secondary | ICD-10-CM | POA: Diagnosis not present

## 2016-10-17 DIAGNOSIS — I129 Hypertensive chronic kidney disease with stage 1 through stage 4 chronic kidney disease, or unspecified chronic kidney disease: Secondary | ICD-10-CM | POA: Diagnosis not present

## 2016-10-17 DIAGNOSIS — Z7982 Long term (current) use of aspirin: Secondary | ICD-10-CM | POA: Diagnosis not present

## 2016-10-17 DIAGNOSIS — J441 Chronic obstructive pulmonary disease with (acute) exacerbation: Secondary | ICD-10-CM | POA: Diagnosis not present

## 2016-10-17 DIAGNOSIS — S80922D Unspecified superficial injury of left lower leg, subsequent encounter: Secondary | ICD-10-CM | POA: Diagnosis not present

## 2016-10-18 DIAGNOSIS — J449 Chronic obstructive pulmonary disease, unspecified: Secondary | ICD-10-CM | POA: Diagnosis not present

## 2016-10-18 DIAGNOSIS — J440 Chronic obstructive pulmonary disease with acute lower respiratory infection: Secondary | ICD-10-CM | POA: Diagnosis not present

## 2016-10-18 DIAGNOSIS — J209 Acute bronchitis, unspecified: Secondary | ICD-10-CM | POA: Diagnosis not present

## 2016-10-18 DIAGNOSIS — I700 Atherosclerosis of aorta: Secondary | ICD-10-CM | POA: Diagnosis not present

## 2016-10-18 DIAGNOSIS — J9811 Atelectasis: Secondary | ICD-10-CM | POA: Diagnosis not present

## 2016-10-18 DIAGNOSIS — I517 Cardiomegaly: Secondary | ICD-10-CM | POA: Diagnosis not present

## 2016-10-18 DIAGNOSIS — Z6841 Body Mass Index (BMI) 40.0 and over, adult: Secondary | ICD-10-CM | POA: Diagnosis not present

## 2016-10-19 DIAGNOSIS — S80922D Unspecified superficial injury of left lower leg, subsequent encounter: Secondary | ICD-10-CM | POA: Diagnosis not present

## 2016-10-19 DIAGNOSIS — R0902 Hypoxemia: Secondary | ICD-10-CM | POA: Diagnosis not present

## 2016-10-19 DIAGNOSIS — S81801D Unspecified open wound, right lower leg, subsequent encounter: Secondary | ICD-10-CM | POA: Diagnosis not present

## 2016-10-19 DIAGNOSIS — M899 Disorder of bone, unspecified: Secondary | ICD-10-CM | POA: Diagnosis not present

## 2016-10-19 DIAGNOSIS — I129 Hypertensive chronic kidney disease with stage 1 through stage 4 chronic kidney disease, or unspecified chronic kidney disease: Secondary | ICD-10-CM | POA: Diagnosis not present

## 2016-10-19 DIAGNOSIS — Z7982 Long term (current) use of aspirin: Secondary | ICD-10-CM | POA: Diagnosis not present

## 2016-10-19 DIAGNOSIS — Z9181 History of falling: Secondary | ICD-10-CM | POA: Diagnosis not present

## 2016-10-19 DIAGNOSIS — N182 Chronic kidney disease, stage 2 (mild): Secondary | ICD-10-CM | POA: Diagnosis not present

## 2016-10-19 DIAGNOSIS — M47817 Spondylosis without myelopathy or radiculopathy, lumbosacral region: Secondary | ICD-10-CM | POA: Diagnosis not present

## 2016-10-19 DIAGNOSIS — I872 Venous insufficiency (chronic) (peripheral): Secondary | ICD-10-CM | POA: Diagnosis not present

## 2016-10-19 DIAGNOSIS — J441 Chronic obstructive pulmonary disease with (acute) exacerbation: Secondary | ICD-10-CM | POA: Diagnosis not present

## 2016-10-19 DIAGNOSIS — J9610 Chronic respiratory failure, unspecified whether with hypoxia or hypercapnia: Secondary | ICD-10-CM | POA: Diagnosis not present

## 2016-10-19 DIAGNOSIS — J449 Chronic obstructive pulmonary disease, unspecified: Secondary | ICD-10-CM | POA: Diagnosis not present

## 2016-10-20 DIAGNOSIS — J441 Chronic obstructive pulmonary disease with (acute) exacerbation: Secondary | ICD-10-CM | POA: Diagnosis not present

## 2016-10-20 DIAGNOSIS — I872 Venous insufficiency (chronic) (peripheral): Secondary | ICD-10-CM | POA: Diagnosis not present

## 2016-10-20 DIAGNOSIS — S80922D Unspecified superficial injury of left lower leg, subsequent encounter: Secondary | ICD-10-CM | POA: Diagnosis not present

## 2016-10-20 DIAGNOSIS — Z9181 History of falling: Secondary | ICD-10-CM | POA: Diagnosis not present

## 2016-10-20 DIAGNOSIS — J9610 Chronic respiratory failure, unspecified whether with hypoxia or hypercapnia: Secondary | ICD-10-CM | POA: Diagnosis not present

## 2016-10-20 DIAGNOSIS — N182 Chronic kidney disease, stage 2 (mild): Secondary | ICD-10-CM | POA: Diagnosis not present

## 2016-10-20 DIAGNOSIS — S81801D Unspecified open wound, right lower leg, subsequent encounter: Secondary | ICD-10-CM | POA: Diagnosis not present

## 2016-10-20 DIAGNOSIS — Z7982 Long term (current) use of aspirin: Secondary | ICD-10-CM | POA: Diagnosis not present

## 2016-10-20 DIAGNOSIS — I129 Hypertensive chronic kidney disease with stage 1 through stage 4 chronic kidney disease, or unspecified chronic kidney disease: Secondary | ICD-10-CM | POA: Diagnosis not present

## 2016-10-23 DIAGNOSIS — Z9181 History of falling: Secondary | ICD-10-CM | POA: Diagnosis not present

## 2016-10-23 DIAGNOSIS — N182 Chronic kidney disease, stage 2 (mild): Secondary | ICD-10-CM | POA: Diagnosis not present

## 2016-10-23 DIAGNOSIS — J9610 Chronic respiratory failure, unspecified whether with hypoxia or hypercapnia: Secondary | ICD-10-CM | POA: Diagnosis not present

## 2016-10-23 DIAGNOSIS — S80922D Unspecified superficial injury of left lower leg, subsequent encounter: Secondary | ICD-10-CM | POA: Diagnosis not present

## 2016-10-23 DIAGNOSIS — I872 Venous insufficiency (chronic) (peripheral): Secondary | ICD-10-CM | POA: Diagnosis not present

## 2016-10-23 DIAGNOSIS — I129 Hypertensive chronic kidney disease with stage 1 through stage 4 chronic kidney disease, or unspecified chronic kidney disease: Secondary | ICD-10-CM | POA: Diagnosis not present

## 2016-10-23 DIAGNOSIS — S81801D Unspecified open wound, right lower leg, subsequent encounter: Secondary | ICD-10-CM | POA: Diagnosis not present

## 2016-10-23 DIAGNOSIS — Z7982 Long term (current) use of aspirin: Secondary | ICD-10-CM | POA: Diagnosis not present

## 2016-10-23 DIAGNOSIS — J441 Chronic obstructive pulmonary disease with (acute) exacerbation: Secondary | ICD-10-CM | POA: Diagnosis not present

## 2016-10-24 DIAGNOSIS — I129 Hypertensive chronic kidney disease with stage 1 through stage 4 chronic kidney disease, or unspecified chronic kidney disease: Secondary | ICD-10-CM | POA: Diagnosis not present

## 2016-10-24 DIAGNOSIS — N182 Chronic kidney disease, stage 2 (mild): Secondary | ICD-10-CM | POA: Diagnosis not present

## 2016-10-24 DIAGNOSIS — I872 Venous insufficiency (chronic) (peripheral): Secondary | ICD-10-CM | POA: Diagnosis not present

## 2016-10-24 DIAGNOSIS — Z9181 History of falling: Secondary | ICD-10-CM | POA: Diagnosis not present

## 2016-10-24 DIAGNOSIS — S80922D Unspecified superficial injury of left lower leg, subsequent encounter: Secondary | ICD-10-CM | POA: Diagnosis not present

## 2016-10-24 DIAGNOSIS — J441 Chronic obstructive pulmonary disease with (acute) exacerbation: Secondary | ICD-10-CM | POA: Diagnosis not present

## 2016-10-24 DIAGNOSIS — J9610 Chronic respiratory failure, unspecified whether with hypoxia or hypercapnia: Secondary | ICD-10-CM | POA: Diagnosis not present

## 2016-10-24 DIAGNOSIS — S81801D Unspecified open wound, right lower leg, subsequent encounter: Secondary | ICD-10-CM | POA: Diagnosis not present

## 2016-10-24 DIAGNOSIS — Z7982 Long term (current) use of aspirin: Secondary | ICD-10-CM | POA: Diagnosis not present

## 2016-10-25 ENCOUNTER — Ambulatory Visit: Admit: 2016-10-25 | Payer: Commercial Managed Care - HMO | Admitting: Plastic Surgery

## 2016-10-25 DIAGNOSIS — Z7982 Long term (current) use of aspirin: Secondary | ICD-10-CM | POA: Diagnosis not present

## 2016-10-25 DIAGNOSIS — Z9181 History of falling: Secondary | ICD-10-CM | POA: Diagnosis not present

## 2016-10-25 DIAGNOSIS — S80922D Unspecified superficial injury of left lower leg, subsequent encounter: Secondary | ICD-10-CM | POA: Diagnosis not present

## 2016-10-25 DIAGNOSIS — N182 Chronic kidney disease, stage 2 (mild): Secondary | ICD-10-CM | POA: Diagnosis not present

## 2016-10-25 DIAGNOSIS — I872 Venous insufficiency (chronic) (peripheral): Secondary | ICD-10-CM | POA: Diagnosis not present

## 2016-10-25 DIAGNOSIS — J441 Chronic obstructive pulmonary disease with (acute) exacerbation: Secondary | ICD-10-CM | POA: Diagnosis not present

## 2016-10-25 DIAGNOSIS — I129 Hypertensive chronic kidney disease with stage 1 through stage 4 chronic kidney disease, or unspecified chronic kidney disease: Secondary | ICD-10-CM | POA: Diagnosis not present

## 2016-10-25 DIAGNOSIS — J9610 Chronic respiratory failure, unspecified whether with hypoxia or hypercapnia: Secondary | ICD-10-CM | POA: Diagnosis not present

## 2016-10-25 DIAGNOSIS — S81801D Unspecified open wound, right lower leg, subsequent encounter: Secondary | ICD-10-CM | POA: Diagnosis not present

## 2016-10-25 SURGERY — IRRIGATION AND DEBRIDEMENT EXTREMITY
Anesthesia: General | Laterality: Right

## 2016-10-30 DIAGNOSIS — S81801D Unspecified open wound, right lower leg, subsequent encounter: Secondary | ICD-10-CM | POA: Diagnosis not present

## 2016-10-31 DIAGNOSIS — Z9181 History of falling: Secondary | ICD-10-CM | POA: Diagnosis not present

## 2016-10-31 DIAGNOSIS — S80922D Unspecified superficial injury of left lower leg, subsequent encounter: Secondary | ICD-10-CM | POA: Diagnosis not present

## 2016-10-31 DIAGNOSIS — I872 Venous insufficiency (chronic) (peripheral): Secondary | ICD-10-CM | POA: Diagnosis not present

## 2016-10-31 DIAGNOSIS — S81801D Unspecified open wound, right lower leg, subsequent encounter: Secondary | ICD-10-CM | POA: Diagnosis not present

## 2016-10-31 DIAGNOSIS — J441 Chronic obstructive pulmonary disease with (acute) exacerbation: Secondary | ICD-10-CM | POA: Diagnosis not present

## 2016-10-31 DIAGNOSIS — J9610 Chronic respiratory failure, unspecified whether with hypoxia or hypercapnia: Secondary | ICD-10-CM | POA: Diagnosis not present

## 2016-10-31 DIAGNOSIS — Z7982 Long term (current) use of aspirin: Secondary | ICD-10-CM | POA: Diagnosis not present

## 2016-10-31 DIAGNOSIS — I129 Hypertensive chronic kidney disease with stage 1 through stage 4 chronic kidney disease, or unspecified chronic kidney disease: Secondary | ICD-10-CM | POA: Diagnosis not present

## 2016-10-31 DIAGNOSIS — N182 Chronic kidney disease, stage 2 (mild): Secondary | ICD-10-CM | POA: Diagnosis not present

## 2016-11-02 DIAGNOSIS — Z9181 History of falling: Secondary | ICD-10-CM | POA: Diagnosis not present

## 2016-11-02 DIAGNOSIS — N182 Chronic kidney disease, stage 2 (mild): Secondary | ICD-10-CM | POA: Diagnosis not present

## 2016-11-02 DIAGNOSIS — S81801D Unspecified open wound, right lower leg, subsequent encounter: Secondary | ICD-10-CM | POA: Diagnosis not present

## 2016-11-02 DIAGNOSIS — J9610 Chronic respiratory failure, unspecified whether with hypoxia or hypercapnia: Secondary | ICD-10-CM | POA: Diagnosis not present

## 2016-11-02 DIAGNOSIS — I129 Hypertensive chronic kidney disease with stage 1 through stage 4 chronic kidney disease, or unspecified chronic kidney disease: Secondary | ICD-10-CM | POA: Diagnosis not present

## 2016-11-02 DIAGNOSIS — S80922D Unspecified superficial injury of left lower leg, subsequent encounter: Secondary | ICD-10-CM | POA: Diagnosis not present

## 2016-11-02 DIAGNOSIS — Z7982 Long term (current) use of aspirin: Secondary | ICD-10-CM | POA: Diagnosis not present

## 2016-11-02 DIAGNOSIS — J441 Chronic obstructive pulmonary disease with (acute) exacerbation: Secondary | ICD-10-CM | POA: Diagnosis not present

## 2016-11-02 DIAGNOSIS — I872 Venous insufficiency (chronic) (peripheral): Secondary | ICD-10-CM | POA: Diagnosis not present

## 2016-11-07 DIAGNOSIS — Z9181 History of falling: Secondary | ICD-10-CM | POA: Diagnosis not present

## 2016-11-07 DIAGNOSIS — J441 Chronic obstructive pulmonary disease with (acute) exacerbation: Secondary | ICD-10-CM | POA: Diagnosis not present

## 2016-11-07 DIAGNOSIS — Z7982 Long term (current) use of aspirin: Secondary | ICD-10-CM | POA: Diagnosis not present

## 2016-11-07 DIAGNOSIS — S81801D Unspecified open wound, right lower leg, subsequent encounter: Secondary | ICD-10-CM | POA: Diagnosis not present

## 2016-11-07 DIAGNOSIS — S80922D Unspecified superficial injury of left lower leg, subsequent encounter: Secondary | ICD-10-CM | POA: Diagnosis not present

## 2016-11-07 DIAGNOSIS — I872 Venous insufficiency (chronic) (peripheral): Secondary | ICD-10-CM | POA: Diagnosis not present

## 2016-11-07 DIAGNOSIS — I129 Hypertensive chronic kidney disease with stage 1 through stage 4 chronic kidney disease, or unspecified chronic kidney disease: Secondary | ICD-10-CM | POA: Diagnosis not present

## 2016-11-07 DIAGNOSIS — J9610 Chronic respiratory failure, unspecified whether with hypoxia or hypercapnia: Secondary | ICD-10-CM | POA: Diagnosis not present

## 2016-11-07 DIAGNOSIS — N182 Chronic kidney disease, stage 2 (mild): Secondary | ICD-10-CM | POA: Diagnosis not present

## 2016-11-08 DIAGNOSIS — J441 Chronic obstructive pulmonary disease with (acute) exacerbation: Secondary | ICD-10-CM | POA: Diagnosis not present

## 2016-11-08 DIAGNOSIS — J9610 Chronic respiratory failure, unspecified whether with hypoxia or hypercapnia: Secondary | ICD-10-CM | POA: Diagnosis not present

## 2016-11-08 DIAGNOSIS — N182 Chronic kidney disease, stage 2 (mild): Secondary | ICD-10-CM | POA: Diagnosis not present

## 2016-11-08 DIAGNOSIS — S81801D Unspecified open wound, right lower leg, subsequent encounter: Secondary | ICD-10-CM | POA: Diagnosis not present

## 2016-11-08 DIAGNOSIS — Z9181 History of falling: Secondary | ICD-10-CM | POA: Diagnosis not present

## 2016-11-08 DIAGNOSIS — I872 Venous insufficiency (chronic) (peripheral): Secondary | ICD-10-CM | POA: Diagnosis not present

## 2016-11-08 DIAGNOSIS — S80922D Unspecified superficial injury of left lower leg, subsequent encounter: Secondary | ICD-10-CM | POA: Diagnosis not present

## 2016-11-08 DIAGNOSIS — Z7982 Long term (current) use of aspirin: Secondary | ICD-10-CM | POA: Diagnosis not present

## 2016-11-08 DIAGNOSIS — I129 Hypertensive chronic kidney disease with stage 1 through stage 4 chronic kidney disease, or unspecified chronic kidney disease: Secondary | ICD-10-CM | POA: Diagnosis not present

## 2016-11-15 DIAGNOSIS — J9610 Chronic respiratory failure, unspecified whether with hypoxia or hypercapnia: Secondary | ICD-10-CM | POA: Diagnosis not present

## 2016-11-15 DIAGNOSIS — J441 Chronic obstructive pulmonary disease with (acute) exacerbation: Secondary | ICD-10-CM | POA: Diagnosis not present

## 2016-11-15 DIAGNOSIS — S81801D Unspecified open wound, right lower leg, subsequent encounter: Secondary | ICD-10-CM | POA: Diagnosis not present

## 2016-11-15 DIAGNOSIS — Z9181 History of falling: Secondary | ICD-10-CM | POA: Diagnosis not present

## 2016-11-15 DIAGNOSIS — I872 Venous insufficiency (chronic) (peripheral): Secondary | ICD-10-CM | POA: Diagnosis not present

## 2016-11-15 DIAGNOSIS — Z7982 Long term (current) use of aspirin: Secondary | ICD-10-CM | POA: Diagnosis not present

## 2016-11-15 DIAGNOSIS — S80922D Unspecified superficial injury of left lower leg, subsequent encounter: Secondary | ICD-10-CM | POA: Diagnosis not present

## 2016-11-15 DIAGNOSIS — N182 Chronic kidney disease, stage 2 (mild): Secondary | ICD-10-CM | POA: Diagnosis not present

## 2016-11-15 DIAGNOSIS — I129 Hypertensive chronic kidney disease with stage 1 through stage 4 chronic kidney disease, or unspecified chronic kidney disease: Secondary | ICD-10-CM | POA: Diagnosis not present

## 2016-11-16 DIAGNOSIS — I872 Venous insufficiency (chronic) (peripheral): Secondary | ICD-10-CM | POA: Diagnosis not present

## 2016-11-16 DIAGNOSIS — Z9181 History of falling: Secondary | ICD-10-CM | POA: Diagnosis not present

## 2016-11-16 DIAGNOSIS — S81801D Unspecified open wound, right lower leg, subsequent encounter: Secondary | ICD-10-CM | POA: Diagnosis not present

## 2016-11-16 DIAGNOSIS — J441 Chronic obstructive pulmonary disease with (acute) exacerbation: Secondary | ICD-10-CM | POA: Diagnosis not present

## 2016-11-16 DIAGNOSIS — I129 Hypertensive chronic kidney disease with stage 1 through stage 4 chronic kidney disease, or unspecified chronic kidney disease: Secondary | ICD-10-CM | POA: Diagnosis not present

## 2016-11-16 DIAGNOSIS — J9610 Chronic respiratory failure, unspecified whether with hypoxia or hypercapnia: Secondary | ICD-10-CM | POA: Diagnosis not present

## 2016-11-16 DIAGNOSIS — Z7982 Long term (current) use of aspirin: Secondary | ICD-10-CM | POA: Diagnosis not present

## 2016-11-16 DIAGNOSIS — N182 Chronic kidney disease, stage 2 (mild): Secondary | ICD-10-CM | POA: Diagnosis not present

## 2016-11-16 DIAGNOSIS — S80922D Unspecified superficial injury of left lower leg, subsequent encounter: Secondary | ICD-10-CM | POA: Diagnosis not present

## 2016-11-18 DIAGNOSIS — M47817 Spondylosis without myelopathy or radiculopathy, lumbosacral region: Secondary | ICD-10-CM | POA: Diagnosis not present

## 2016-11-18 DIAGNOSIS — M899 Disorder of bone, unspecified: Secondary | ICD-10-CM | POA: Diagnosis not present

## 2016-11-18 DIAGNOSIS — R0902 Hypoxemia: Secondary | ICD-10-CM | POA: Diagnosis not present

## 2016-11-18 DIAGNOSIS — J449 Chronic obstructive pulmonary disease, unspecified: Secondary | ICD-10-CM | POA: Diagnosis not present

## 2016-11-21 DIAGNOSIS — R7303 Prediabetes: Secondary | ICD-10-CM | POA: Diagnosis not present

## 2016-11-21 DIAGNOSIS — J441 Chronic obstructive pulmonary disease with (acute) exacerbation: Secondary | ICD-10-CM | POA: Diagnosis not present

## 2016-11-21 DIAGNOSIS — S81801D Unspecified open wound, right lower leg, subsequent encounter: Secondary | ICD-10-CM | POA: Diagnosis not present

## 2016-11-21 DIAGNOSIS — S80922D Unspecified superficial injury of left lower leg, subsequent encounter: Secondary | ICD-10-CM | POA: Diagnosis not present

## 2016-11-21 DIAGNOSIS — I129 Hypertensive chronic kidney disease with stage 1 through stage 4 chronic kidney disease, or unspecified chronic kidney disease: Secondary | ICD-10-CM | POA: Diagnosis not present

## 2016-11-21 DIAGNOSIS — N182 Chronic kidney disease, stage 2 (mild): Secondary | ICD-10-CM | POA: Diagnosis not present

## 2016-11-21 DIAGNOSIS — Z7982 Long term (current) use of aspirin: Secondary | ICD-10-CM | POA: Diagnosis not present

## 2016-11-21 DIAGNOSIS — J9610 Chronic respiratory failure, unspecified whether with hypoxia or hypercapnia: Secondary | ICD-10-CM | POA: Diagnosis not present

## 2016-11-21 DIAGNOSIS — Z9181 History of falling: Secondary | ICD-10-CM | POA: Diagnosis not present

## 2016-11-21 DIAGNOSIS — J449 Chronic obstructive pulmonary disease, unspecified: Secondary | ICD-10-CM | POA: Diagnosis not present

## 2016-11-21 DIAGNOSIS — I872 Venous insufficiency (chronic) (peripheral): Secondary | ICD-10-CM | POA: Diagnosis not present

## 2016-12-03 ENCOUNTER — Emergency Department (HOSPITAL_COMMUNITY): Payer: Commercial Managed Care - HMO

## 2016-12-03 ENCOUNTER — Inpatient Hospital Stay (HOSPITAL_COMMUNITY)
Admission: EM | Admit: 2016-12-03 | Discharge: 2016-12-13 | DRG: 871 | Disposition: A | Discharge status: Skilled Nursing Facility | Payer: Commercial Managed Care - HMO | Attending: Internal Medicine | Admitting: Internal Medicine

## 2016-12-03 DIAGNOSIS — J9622 Acute and chronic respiratory failure with hypercapnia: Secondary | ICD-10-CM | POA: Diagnosis present

## 2016-12-03 DIAGNOSIS — Z9981 Dependence on supplemental oxygen: Secondary | ICD-10-CM

## 2016-12-03 DIAGNOSIS — I248 Other forms of acute ischemic heart disease: Secondary | ICD-10-CM | POA: Diagnosis not present

## 2016-12-03 DIAGNOSIS — N171 Acute kidney failure with acute cortical necrosis: Secondary | ICD-10-CM | POA: Diagnosis not present

## 2016-12-03 DIAGNOSIS — F418 Other specified anxiety disorders: Secondary | ICD-10-CM | POA: Diagnosis not present

## 2016-12-03 DIAGNOSIS — K219 Gastro-esophageal reflux disease without esophagitis: Secondary | ICD-10-CM | POA: Diagnosis present

## 2016-12-03 DIAGNOSIS — Z7982 Long term (current) use of aspirin: Secondary | ICD-10-CM

## 2016-12-03 DIAGNOSIS — F329 Major depressive disorder, single episode, unspecified: Secondary | ICD-10-CM | POA: Diagnosis present

## 2016-12-03 DIAGNOSIS — L97921 Non-pressure chronic ulcer of unspecified part of left lower leg limited to breakdown of skin: Secondary | ICD-10-CM | POA: Diagnosis present

## 2016-12-03 DIAGNOSIS — I447 Left bundle-branch block, unspecified: Secondary | ICD-10-CM | POA: Diagnosis present

## 2016-12-03 DIAGNOSIS — E873 Alkalosis: Secondary | ICD-10-CM | POA: Diagnosis not present

## 2016-12-03 DIAGNOSIS — I493 Ventricular premature depolarization: Secondary | ICD-10-CM | POA: Diagnosis not present

## 2016-12-03 DIAGNOSIS — G4733 Obstructive sleep apnea (adult) (pediatric): Secondary | ICD-10-CM | POA: Diagnosis present

## 2016-12-03 DIAGNOSIS — J9811 Atelectasis: Secondary | ICD-10-CM | POA: Diagnosis present

## 2016-12-03 DIAGNOSIS — L98491 Non-pressure chronic ulcer of skin of other sites limited to breakdown of skin: Secondary | ICD-10-CM | POA: Diagnosis present

## 2016-12-03 DIAGNOSIS — I5042 Chronic combined systolic (congestive) and diastolic (congestive) heart failure: Secondary | ICD-10-CM | POA: Diagnosis not present

## 2016-12-03 DIAGNOSIS — N183 Chronic kidney disease, stage 3 (moderate): Secondary | ICD-10-CM | POA: Diagnosis not present

## 2016-12-03 DIAGNOSIS — J9601 Acute respiratory failure with hypoxia: Secondary | ICD-10-CM

## 2016-12-03 DIAGNOSIS — E785 Hyperlipidemia, unspecified: Secondary | ICD-10-CM | POA: Diagnosis present

## 2016-12-03 DIAGNOSIS — F32A Depression, unspecified: Secondary | ICD-10-CM | POA: Diagnosis present

## 2016-12-03 DIAGNOSIS — D72829 Elevated white blood cell count, unspecified: Secondary | ICD-10-CM | POA: Diagnosis present

## 2016-12-03 DIAGNOSIS — R0902 Hypoxemia: Secondary | ICD-10-CM | POA: Diagnosis not present

## 2016-12-03 DIAGNOSIS — I10 Essential (primary) hypertension: Secondary | ICD-10-CM | POA: Diagnosis present

## 2016-12-03 DIAGNOSIS — N179 Acute kidney failure, unspecified: Secondary | ICD-10-CM | POA: Diagnosis present

## 2016-12-03 DIAGNOSIS — I5032 Chronic diastolic (congestive) heart failure: Secondary | ICD-10-CM | POA: Diagnosis present

## 2016-12-03 DIAGNOSIS — I5043 Acute on chronic combined systolic (congestive) and diastolic (congestive) heart failure: Secondary | ICD-10-CM | POA: Diagnosis present

## 2016-12-03 DIAGNOSIS — N184 Chronic kidney disease, stage 4 (severe): Secondary | ICD-10-CM | POA: Diagnosis not present

## 2016-12-03 DIAGNOSIS — J189 Pneumonia, unspecified organism: Secondary | ICD-10-CM | POA: Diagnosis not present

## 2016-12-03 DIAGNOSIS — F419 Anxiety disorder, unspecified: Secondary | ICD-10-CM | POA: Diagnosis present

## 2016-12-03 DIAGNOSIS — I5033 Acute on chronic diastolic (congestive) heart failure: Secondary | ICD-10-CM | POA: Diagnosis not present

## 2016-12-03 DIAGNOSIS — I130 Hypertensive heart and chronic kidney disease with heart failure and stage 1 through stage 4 chronic kidney disease, or unspecified chronic kidney disease: Secondary | ICD-10-CM | POA: Diagnosis present

## 2016-12-03 DIAGNOSIS — N172 Acute kidney failure with medullary necrosis: Secondary | ICD-10-CM | POA: Diagnosis not present

## 2016-12-03 DIAGNOSIS — L97911 Non-pressure chronic ulcer of unspecified part of right lower leg limited to breakdown of skin: Secondary | ICD-10-CM | POA: Diagnosis present

## 2016-12-03 DIAGNOSIS — Z90710 Acquired absence of both cervix and uterus: Secondary | ICD-10-CM

## 2016-12-03 DIAGNOSIS — Z882 Allergy status to sulfonamides status: Secondary | ICD-10-CM

## 2016-12-03 DIAGNOSIS — J9602 Acute respiratory failure with hypercapnia: Secondary | ICD-10-CM | POA: Diagnosis not present

## 2016-12-03 DIAGNOSIS — M810 Age-related osteoporosis without current pathological fracture: Secondary | ICD-10-CM | POA: Diagnosis present

## 2016-12-03 DIAGNOSIS — Z87891 Personal history of nicotine dependence: Secondary | ICD-10-CM

## 2016-12-03 DIAGNOSIS — R0602 Shortness of breath: Secondary | ICD-10-CM | POA: Diagnosis not present

## 2016-12-03 DIAGNOSIS — I83009 Varicose veins of unspecified lower extremity with ulcer of unspecified site: Secondary | ICD-10-CM | POA: Diagnosis present

## 2016-12-03 DIAGNOSIS — J449 Chronic obstructive pulmonary disease, unspecified: Secondary | ICD-10-CM

## 2016-12-03 DIAGNOSIS — A419 Sepsis, unspecified organism: Secondary | ICD-10-CM | POA: Diagnosis present

## 2016-12-03 DIAGNOSIS — R748 Abnormal levels of other serum enzymes: Secondary | ICD-10-CM | POA: Diagnosis not present

## 2016-12-03 DIAGNOSIS — J440 Chronic obstructive pulmonary disease with acute lower respiratory infection: Secondary | ICD-10-CM | POA: Diagnosis present

## 2016-12-03 DIAGNOSIS — J441 Chronic obstructive pulmonary disease with (acute) exacerbation: Secondary | ICD-10-CM | POA: Diagnosis present

## 2016-12-03 DIAGNOSIS — R05 Cough: Secondary | ICD-10-CM | POA: Diagnosis not present

## 2016-12-03 DIAGNOSIS — N189 Chronic kidney disease, unspecified: Secondary | ICD-10-CM

## 2016-12-03 DIAGNOSIS — J9690 Respiratory failure, unspecified, unspecified whether with hypoxia or hypercapnia: Secondary | ICD-10-CM | POA: Diagnosis not present

## 2016-12-03 DIAGNOSIS — I471 Supraventricular tachycardia: Secondary | ICD-10-CM | POA: Diagnosis not present

## 2016-12-03 DIAGNOSIS — Z9119 Patient's noncompliance with other medical treatment and regimen: Secondary | ICD-10-CM

## 2016-12-03 DIAGNOSIS — R778 Other specified abnormalities of plasma proteins: Secondary | ICD-10-CM | POA: Diagnosis present

## 2016-12-03 DIAGNOSIS — Z7951 Long term (current) use of inhaled steroids: Secondary | ICD-10-CM

## 2016-12-03 DIAGNOSIS — L97901 Non-pressure chronic ulcer of unspecified part of unspecified lower leg limited to breakdown of skin: Secondary | ICD-10-CM | POA: Diagnosis present

## 2016-12-03 DIAGNOSIS — J9621 Acute and chronic respiratory failure with hypoxia: Secondary | ICD-10-CM | POA: Diagnosis not present

## 2016-12-03 DIAGNOSIS — N170 Acute kidney failure with tubular necrosis: Secondary | ICD-10-CM | POA: Diagnosis not present

## 2016-12-03 DIAGNOSIS — Z79899 Other long term (current) drug therapy: Secondary | ICD-10-CM

## 2016-12-03 DIAGNOSIS — I509 Heart failure, unspecified: Secondary | ICD-10-CM | POA: Diagnosis not present

## 2016-12-03 DIAGNOSIS — R069 Unspecified abnormalities of breathing: Secondary | ICD-10-CM | POA: Diagnosis not present

## 2016-12-03 DIAGNOSIS — L97909 Non-pressure chronic ulcer of unspecified part of unspecified lower leg with unspecified severity: Secondary | ICD-10-CM

## 2016-12-03 DIAGNOSIS — I878 Other specified disorders of veins: Secondary | ICD-10-CM | POA: Diagnosis present

## 2016-12-03 DIAGNOSIS — Z825 Family history of asthma and other chronic lower respiratory diseases: Secondary | ICD-10-CM

## 2016-12-03 DIAGNOSIS — Z6841 Body Mass Index (BMI) 40.0 and over, adult: Secondary | ICD-10-CM | POA: Diagnosis not present

## 2016-12-03 DIAGNOSIS — E559 Vitamin D deficiency, unspecified: Secondary | ICD-10-CM | POA: Diagnosis present

## 2016-12-03 DIAGNOSIS — T501X5A Adverse effect of loop [high-ceiling] diuretics, initial encounter: Secondary | ICD-10-CM | POA: Diagnosis present

## 2016-12-03 DIAGNOSIS — R7989 Other specified abnormal findings of blood chemistry: Secondary | ICD-10-CM | POA: Diagnosis present

## 2016-12-03 DIAGNOSIS — D631 Anemia in chronic kidney disease: Secondary | ICD-10-CM | POA: Diagnosis present

## 2016-12-03 DIAGNOSIS — I872 Venous insufficiency (chronic) (peripheral): Secondary | ICD-10-CM | POA: Diagnosis present

## 2016-12-03 DIAGNOSIS — E538 Deficiency of other specified B group vitamins: Secondary | ICD-10-CM | POA: Diagnosis present

## 2016-12-03 DIAGNOSIS — J9600 Acute respiratory failure, unspecified whether with hypoxia or hypercapnia: Secondary | ICD-10-CM | POA: Diagnosis not present

## 2016-12-03 LAB — I-STAT ARTERIAL BLOOD GAS, ED
Acid-Base Excess: 14 mmol/L — ABNORMAL HIGH (ref 0.0–2.0)
Bicarbonate: 42.2 mmol/L — ABNORMAL HIGH (ref 20.0–28.0)
O2 Saturation: 97 %
Patient temperature: 101
TCO2: 44 mmol/L (ref 0–100)
pCO2 arterial: 76.8 mmHg (ref 32.0–48.0)
pH, Arterial: 7.354 (ref 7.350–7.450)
pO2, Arterial: 112 mmHg — ABNORMAL HIGH (ref 83.0–108.0)

## 2016-12-03 LAB — URINALYSIS, ROUTINE W REFLEX MICROSCOPIC
BILIRUBIN URINE: NEGATIVE
GLUCOSE, UA: NEGATIVE mg/dL
Ketones, ur: NEGATIVE mg/dL
LEUKOCYTES UA: NEGATIVE
NITRITE: NEGATIVE
PH: 5 (ref 5.0–8.0)
Protein, ur: 100 mg/dL — AB
SPECIFIC GRAVITY, URINE: 1.013 (ref 1.005–1.030)

## 2016-12-03 LAB — COMPREHENSIVE METABOLIC PANEL
ALBUMIN: 2.8 g/dL — AB (ref 3.5–5.0)
ALK PHOS: 58 U/L (ref 38–126)
ALT: 9 U/L — ABNORMAL LOW (ref 14–54)
AST: 19 U/L (ref 15–41)
Anion gap: 13 (ref 5–15)
BILIRUBIN TOTAL: 0.6 mg/dL (ref 0.3–1.2)
BUN: 11 mg/dL (ref 6–20)
CALCIUM: 8.7 mg/dL — AB (ref 8.9–10.3)
CO2: 34 mmol/L — ABNORMAL HIGH (ref 22–32)
CREATININE: 1.67 mg/dL — AB (ref 0.44–1.00)
Chloride: 86 mmol/L — ABNORMAL LOW (ref 101–111)
GFR calc Af Amer: 35 mL/min — ABNORMAL LOW (ref >60)
GFR, EST NON AFRICAN AMERICAN: 30 mL/min — AB (ref >60)
Glucose, Bld: 132 mg/dL — ABNORMAL HIGH (ref 65–99)
Potassium: 3.5 mmol/L (ref 3.5–5.1)
Sodium: 133 mmol/L — ABNORMAL LOW (ref 135–145)
TOTAL PROTEIN: 6.7 g/dL (ref 6.5–8.1)

## 2016-12-03 LAB — BRAIN NATRIURETIC PEPTIDE: B Natriuretic Peptide: 165.1 pg/mL — ABNORMAL HIGH (ref 0.0–100.0)

## 2016-12-03 LAB — TROPONIN I: Troponin I: 0.16 ng/mL

## 2016-12-03 LAB — CBC WITH DIFFERENTIAL/PLATELET
BASOS PCT: 0 %
Basophils Absolute: 0 10*3/uL (ref 0.0–0.1)
EOS ABS: 0 10*3/uL (ref 0.0–0.7)
EOS PCT: 0 %
HCT: 31.7 % — ABNORMAL LOW (ref 36.0–46.0)
Hemoglobin: 9.1 g/dL — ABNORMAL LOW (ref 12.0–15.0)
Lymphocytes Relative: 6 %
Lymphs Abs: 0.9 10*3/uL (ref 0.7–4.0)
MCH: 24.2 pg — ABNORMAL LOW (ref 26.0–34.0)
MCHC: 28.7 g/dL — ABNORMAL LOW (ref 30.0–36.0)
MCV: 84.3 fL (ref 78.0–100.0)
MONO ABS: 1.5 10*3/uL — AB (ref 0.1–1.0)
Monocytes Relative: 11 %
Neutro Abs: 11.6 10*3/uL — ABNORMAL HIGH (ref 1.7–7.7)
Neutrophils Relative %: 83 %
PLATELETS: 272 10*3/uL (ref 150–400)
RBC: 3.76 MIL/uL — ABNORMAL LOW (ref 3.87–5.11)
RDW: 15.3 % (ref 11.5–15.5)
WBC: 14 10*3/uL — AB (ref 4.0–10.5)

## 2016-12-03 LAB — PROCALCITONIN: PROCALCITONIN: 0.24 ng/mL

## 2016-12-03 LAB — I-STAT CG4 LACTIC ACID, ED: Lactic Acid, Venous: 1.58 mmol/L (ref 0.5–1.9)

## 2016-12-03 MED ORDER — ASPIRIN 325 MG PO TABS
325.0000 mg | ORAL_TABLET | Freq: Once | ORAL | Status: AC
  Administered 2016-12-03: 325 mg via ORAL
  Filled 2016-12-03: qty 1

## 2016-12-03 MED ORDER — METHYLPREDNISOLONE SODIUM SUCC 125 MG IJ SOLR
125.0000 mg | Freq: Once | INTRAMUSCULAR | Status: AC
  Administered 2016-12-03: 125 mg via INTRAVENOUS
  Filled 2016-12-03: qty 2

## 2016-12-03 MED ORDER — AZITHROMYCIN 500 MG IV SOLR
500.0000 mg | Freq: Once | INTRAVENOUS | Status: AC
  Administered 2016-12-03: 500 mg via INTRAVENOUS
  Filled 2016-12-03: qty 500

## 2016-12-03 MED ORDER — VITAMIN B-12 100 MCG PO TABS
500.0000 ug | ORAL_TABLET | Freq: Every day | ORAL | Status: DC
  Administered 2016-12-03 – 2016-12-13 (×11): 500 ug via ORAL
  Filled 2016-12-03: qty 1
  Filled 2016-12-03: qty 5
  Filled 2016-12-03 (×3): qty 1
  Filled 2016-12-03: qty 5
  Filled 2016-12-03 (×4): qty 1
  Filled 2016-12-03: qty 5

## 2016-12-03 MED ORDER — VITAMIN D (ERGOCALCIFEROL) 1.25 MG (50000 UNIT) PO CAPS
50000.0000 [IU] | ORAL_CAPSULE | ORAL | Status: DC
  Administered 2016-12-11: 50000 [IU] via ORAL
  Filled 2016-12-03 (×2): qty 1

## 2016-12-03 MED ORDER — ONDANSETRON HCL 4 MG/2ML IJ SOLN: 4.0000 mg | Freq: Four times a day (QID) | INTRAMUSCULAR | Status: DC | PRN

## 2016-12-03 MED ORDER — OXYCODONE-ACETAMINOPHEN 5-325 MG PO TABS: 1.0000 | ORAL_TABLET | Freq: Four times a day (QID) | ORAL | Status: DC | PRN

## 2016-12-03 MED ORDER — DEXTROSE 5 % IV SOLN: 500.0000 mg | Freq: Once | INTRAVENOUS | Status: DC

## 2016-12-03 MED ORDER — ALENDRONATE SODIUM 70 MG PO TABS: 70.0000 mg | ORAL_TABLET | ORAL | Status: DC

## 2016-12-03 MED ORDER — TEMAZEPAM 15 MG PO CAPS
15.0000 mg | ORAL_CAPSULE | Freq: Every evening | ORAL | Status: DC | PRN
  Administered 2016-12-03 – 2016-12-04 (×2): 15 mg via ORAL
  Filled 2016-12-03 (×2): qty 1

## 2016-12-03 MED ORDER — GUAIFENESIN ER 600 MG PO TB12: 600.0000 mg | ORAL_TABLET | Freq: Two times a day (BID) | ORAL | Status: DC | PRN

## 2016-12-03 MED ORDER — DOCUSATE SODIUM 100 MG PO CAPS
100.0000 mg | ORAL_CAPSULE | Freq: Every day | ORAL | Status: DC
  Administered 2016-12-04 – 2016-12-13 (×10): 100 mg via ORAL
  Filled 2016-12-03 (×10): qty 1

## 2016-12-03 MED ORDER — FAMOTIDINE 20 MG PO TABS
20.0000 mg | ORAL_TABLET | Freq: Every day | ORAL | Status: DC
  Administered 2016-12-03 – 2016-12-13 (×11): 20 mg via ORAL
  Filled 2016-12-03 (×12): qty 1

## 2016-12-03 MED ORDER — ALBUTEROL (5 MG/ML) CONTINUOUS INHALATION SOLN
10.0000 mg/h | INHALATION_SOLUTION | RESPIRATORY_TRACT | Status: DC
  Administered 2016-12-03: 10 mg/h via RESPIRATORY_TRACT
  Filled 2016-12-03: qty 20

## 2016-12-03 MED ORDER — POTASSIUM CHLORIDE CRYS ER 20 MEQ PO TBCR
20.0000 meq | EXTENDED_RELEASE_TABLET | Freq: Two times a day (BID) | ORAL | Status: DC
  Administered 2016-12-03 – 2016-12-13 (×20): 20 meq via ORAL
  Filled 2016-12-03 (×20): qty 1

## 2016-12-03 MED ORDER — LORAZEPAM 1 MG PO TABS
1.0000 mg | ORAL_TABLET | Freq: Three times a day (TID) | ORAL | Status: DC | PRN
  Administered 2016-12-06: 1 mg via ORAL
  Filled 2016-12-03: qty 1

## 2016-12-03 MED ORDER — ONDANSETRON HCL 4 MG PO TABS: 4.0000 mg | ORAL_TABLET | Freq: Four times a day (QID) | ORAL | Status: DC | PRN

## 2016-12-03 MED ORDER — FUROSEMIDE 80 MG PO TABS
80.0000 mg | ORAL_TABLET | Freq: Every day | ORAL | Status: DC
  Administered 2016-12-03 – 2016-12-04 (×2): 80 mg via ORAL
  Filled 2016-12-03 (×2): qty 1

## 2016-12-03 MED ORDER — METOPROLOL TARTRATE 25 MG PO TABS
25.0000 mg | ORAL_TABLET | Freq: Two times a day (BID) | ORAL | Status: DC
  Administered 2016-12-03 – 2016-12-10 (×15): 25 mg via ORAL
  Filled 2016-12-03 (×15): qty 1

## 2016-12-03 MED ORDER — CEFTRIAXONE SODIUM 1 G IJ SOLR
1.0000 g | Freq: Once | INTRAMUSCULAR | Status: AC
  Administered 2016-12-03: 1 g via INTRAVENOUS
  Filled 2016-12-03: qty 10

## 2016-12-03 MED ORDER — ACETAMINOPHEN 325 MG PO TABS: 650.0000 mg | ORAL_TABLET | Freq: Four times a day (QID) | ORAL | Status: DC | PRN

## 2016-12-03 MED ORDER — ACETAMINOPHEN 650 MG RE SUPP: 650.0000 mg | Freq: Four times a day (QID) | RECTAL | Status: DC | PRN

## 2016-12-03 MED ORDER — ESCITALOPRAM OXALATE 10 MG PO TABS
20.0000 mg | ORAL_TABLET | Freq: Every day | ORAL | Status: DC
  Administered 2016-12-03 – 2016-12-12 (×10): 20 mg via ORAL
  Filled 2016-12-03 (×12): qty 2

## 2016-12-03 MED ORDER — PANTOPRAZOLE SODIUM 40 MG PO TBEC
40.0000 mg | DELAYED_RELEASE_TABLET | Freq: Every day | ORAL | Status: DC
  Administered 2016-12-03 – 2016-12-13 (×11): 40 mg via ORAL
  Filled 2016-12-03 (×11): qty 1

## 2016-12-03 MED ORDER — DEXTROSE 5 % IV SOLN: 1.0000 g | Freq: Once | INTRAVENOUS | Status: DC

## 2016-12-03 MED ORDER — LINACLOTIDE 145 MCG PO CAPS
145.0000 ug | ORAL_CAPSULE | Freq: Every day | ORAL | Status: DC
  Administered 2016-12-05 – 2016-12-13 (×9): 145 ug via ORAL
  Filled 2016-12-03 (×10): qty 1

## 2016-12-03 MED ORDER — METHYLPREDNISOLONE SODIUM SUCC 125 MG IJ SOLR
60.0000 mg | Freq: Two times a day (BID) | INTRAMUSCULAR | Status: DC
  Administered 2016-12-03 – 2016-12-06 (×6): 60 mg via INTRAVENOUS
  Filled 2016-12-03 (×6): qty 2

## 2016-12-03 MED ORDER — ASPIRIN 81 MG PO CHEW
81.0000 mg | CHEWABLE_TABLET | Freq: Every day | ORAL | Status: DC
  Administered 2016-12-03 – 2016-12-13 (×11): 81 mg via ORAL
  Filled 2016-12-03 (×12): qty 1

## 2016-12-03 MED ORDER — SODIUM CHLORIDE 0.9 % IV BOLUS (SEPSIS)
1000.0000 mL | Freq: Once | INTRAVENOUS | Status: AC
  Administered 2016-12-03: 1000 mL via INTRAVENOUS

## 2016-12-03 MED ORDER — IPRATROPIUM BROMIDE 0.02 % IN SOLN
0.5000 mg | RESPIRATORY_TRACT | Status: DC
Start: 2016-12-04 — End: 2016-12-05
  Administered 2016-12-04 – 2016-12-05 (×9): 0.5 mg via RESPIRATORY_TRACT
  Filled 2016-12-03 (×9): qty 2.5

## 2016-12-03 MED ORDER — ACETAMINOPHEN 650 MG RE SUPP
650.0000 mg | Freq: Once | RECTAL | Status: AC
  Administered 2016-12-03: 650 mg via RECTAL
  Filled 2016-12-03: qty 1

## 2016-12-03 MED ORDER — IPRATROPIUM BROMIDE 0.02 % IN SOLN
1.0000 mg | Freq: Once | RESPIRATORY_TRACT | Status: AC
  Administered 2016-12-03: 1 mg via RESPIRATORY_TRACT
  Filled 2016-12-03: qty 5

## 2016-12-03 MED ORDER — CALCIUM CARBONATE-VITAMIN D 500-200 MG-UNIT PO TABS
1.0000 | ORAL_TABLET | Freq: Two times a day (BID) | ORAL | Status: DC
  Administered 2016-12-04 – 2016-12-13 (×20): 1 via ORAL
  Filled 2016-12-03 (×20): qty 1

## 2016-12-03 MED ORDER — SODIUM CHLORIDE 0.9% FLUSH
3.0000 mL | Freq: Two times a day (BID) | INTRAVENOUS | Status: DC
  Administered 2016-12-03 – 2016-12-12 (×18): 3 mL via INTRAVENOUS

## 2016-12-03 MED ORDER — LEVALBUTEROL HCL 1.25 MG/0.5ML IN NEBU
1.2500 mg | INHALATION_SOLUTION | RESPIRATORY_TRACT | Status: DC
  Administered 2016-12-04: 1.25 mg via RESPIRATORY_TRACT
  Filled 2016-12-03: qty 0.5

## 2016-12-03 MED ORDER — DEXTROSE 5 % IV SOLN
500.0000 mg | INTRAVENOUS | Status: DC
  Administered 2016-12-04 – 2016-12-06 (×3): 500 mg via INTRAVENOUS
  Filled 2016-12-03 (×5): qty 500

## 2016-12-03 MED ORDER — DEXTROSE 5 % IV SOLN
1.0000 g | INTRAVENOUS | Status: DC
  Administered 2016-12-04 – 2016-12-07 (×4): 1 g via INTRAVENOUS
  Filled 2016-12-03 (×5): qty 10

## 2016-12-03 MED ORDER — SODIUM CHLORIDE 0.9 % IV SOLN
INTRAVENOUS | Status: DC
  Administered 2016-12-03: 23:00:00 via INTRAVENOUS

## 2016-12-03 MED ORDER — IPRATROPIUM-ALBUTEROL 0.5-2.5 (3) MG/3ML IN SOLN
3.0000 mL | RESPIRATORY_TRACT | Status: DC | PRN
Start: 2016-12-03 — End: 2016-12-05

## 2016-12-03 NOTE — ED Triage Notes (Signed)
Pt in via [facility] from [facility], pt in for decreased saturations, pt c/o hoarseness & nausea x 1 wk, pt A&o x4, pt on O2 [place] 2 L uses at home

## 2016-12-03 NOTE — H&P (Addendum)
History and Physical    Amanda Horton LTJ:[id number] DOB: Jun 04, 1946 DOA: 12/03/2016  Referring MD/NP/PA: Dr. Dayna Barker   PCP: Nicoletta Dress, MD   Outpatient Specialists: Pulmonary, Dr. Asencion Noble   Patient coming from: home   Chief Complaint: shortness of breath   HPI: Amanda Horton is a 70 y.o. female with medical history significant for COPD on 2 L oxygen at home, OSA, combined CHF (last 2 D ECHO in 08/2016 with EF 45% and grade 1 DD), CKD stage 3 with baseline Cr of 1.2 in 08/2016, hypertension, depression and anxiety. She presented to [facility] with worsening shortness of breath at rest and with exertion over past few days prior to this admission.  She used inhaler and nebulizer at home but with no significant symptomatic relief. She reported associated cough productive of clear to whitish sputum. She also had more frequent awakenings at night time due to shortness of breath and cough. She also reported subjective fevers at home. No chest pain, no palpations. She had some nausea but no vomiting. No abdominal pain, no diarrhea or constipation. She was seen in urgent care where herr oxygen saturation was in mid 80's and for this reason she was referred to ED for further evaluation.   ED Course: In ED, BP was 128/61, T max 101.67F, HR 106-151, RR 18-30, oxygen saturation 92% with BiPAP which has improved to 100%. At this time she is on 5 L [place] and her O2 sa is 95%. Blood work was notable for WBC count 14, hemoglobin 9.1, CO@ 34, Cr 1.67 (baseline in 08/2016 was 1.2), troponin was 0.16, normal lactic acid. BNP was 165. The 12 lead EKG showed atypical LBBB. CXR showed atelectasis but infection not excluded. She was given solumedrol 125 mg IV once and continuous nebs. She continues to have shortness of breath and wheezing. In addition, sepsis criteria met on admission with source likely pneumonia so azithromycin and rocephin started in ED.   Review of Systems:  Constitutional: Negative for fever,  chills, diaphoresis, activity change, appetite change and fatigue.  HENT: Negative for ear pain, nosebleeds, congestion, facial swelling, rhinorrhea, neck pain, neck stiffness and ear discharge.   Eyes: Negative for pain, discharge, redness, itching and visual disturbance.  Respiratory: Per HPI Cardiovascular: Negative for chest pain, palpitations and leg swelling.  Gastrointestinal: Negative for abdominal distention. Negative for abdominal pain. Genitourinary: Negative for dysuria, urgency, frequency, hematuria, flank pain, decreased urine volume, difficulty urinating and dyspareunia.  Musculoskeletal: Negative for back pain, joint swelling, arthralgias and gait problem.  Neurological: Negative for dizziness, tremors, seizures, syncope, facial asymmetry, speech difficulty, weakness, light-headedness, numbness and headaches.  Hematological: Negative for adenopathy. Does not bruise/bleed easily.  Psychiatric/Behavioral: Negative for hallucinations, behavioral problems, confusion, dysphoric mood, decreased concentration and agitation.   Past Medical History:  Diagnosis Date  . Acute hyperkalemia   . Acute hypokalemia   . Acute on chronic renal failure ([place])   . Allergic rhinitis   . Anemia due to chronic kidney disease   . APC (atrial premature contractions)   . 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 Benign essential hypertension   . Borderline diabetes   . Chronic insomnia   . Chronic venous insufficiency   . COPD (chronic obstructive pulmonary disease) ([place])   . GERD (gastroesophageal reflux disease)   . Hyperlipemia   . Hypoxia   . Lumbar disc narrowing   . On home oxygen therapy   . OSA (obstructive sleep apnea)   . Osteoporosis   .  Respiratory failure requiring intubation ([place])   . Spinal stenosis of lumbar region without neurogenic claudication   . Varicose veins with pain   . Vitamin B12 deficiency   . Vitamin D deficiency     Past Surgical History:  Procedure Laterality  Date  . APPLICATION OF A-CELL OF EXTREMITY Right 08/30/2016   Procedure: APPLICATION OF A-CELL OF EXTREMITY;  Surgeon: Loel Lofty Dillingham, DO;  Location: [place];  Service: Plastics;  Laterality: Right;  . APPLICATION OF A-CELL OF EXTREMITY Right 09/20/2016   Procedure: APPLICATION OF A-CELL OF right lower leg wound;  Surgeon: Loel Lofty Dillingham, DO;  Location: [place];  Service: Plastics;  Laterality: Right;  . APPLICATION OF WOUND VAC Right 08/30/2016   Procedure: APPLICATION OF WOUND VAC;  Surgeon: Loel Lofty Dillingham, DO;  Location: [place];  Service: Plastics;  Laterality: Right;  . APPLICATION OF WOUND VAC Right 09/20/2016   Procedure: APPLICATION OF WOUND VAC right lower leg wound;  Surgeon: Loel Lofty Dillingham, DO;  Location: [place];  Service: Plastics;  Laterality: Right;  . I&D EXTREMITY Right 08/30/2016   Procedure: IRRIGATION AND DEBRIDEMENT EXTREMITY RIGHT;  Surgeon: Loel Lofty Dillingham, DO;  Location: [place];  Service: Plastics;  Laterality: Right;  . INCISION AND DRAINAGE OF WOUND Right 09/20/2016   Procedure: IRRIGATION AND DEBRIDEMENT right lower leg wound;  Surgeon: Loel Lofty Dillingham, DO;  Location: [place];  Service: Plastics;  Laterality: Right;  . PARTIAL HYSTERECTOMY      Social history:  reports that she quit smoking about 4 years ago. Her smoking use included Cigarettes. She has a 55.00 pack-year smoking history. She has never used smokeless tobacco. She reports that she does not drink alcohol or use drugs.  Ambulation: ambulates without assistance at baseline   Allergies  Allergen Reactions  . Sulfa Antibiotics Nausea And Vomiting and Other (See Comments)    "Stayed sick"    Family History  Problem Relation Age of Onset  . Emphysema Father   . Asthma Father   . Throat cancer Mother     Prior to Admission medications   Medication Sig Start Date End Date Taking? Authorizing Provider  albuterol (PROVENTIL HFA;VENTOLIN HFA) 108 (90 BASE) MCG/ACT inhaler Inhale 2 puffs  into the lungs every 6 (six) hours as needed for wheezing or shortness of breath.   Yes Historical Provider, MD  albuterol (PROVENTIL) (2.5 MG/3ML) 0.083% nebulizer solution Take 3 mLs (2.5 mg total) by nebulization every 6 (six) hours as needed for wheezing or shortness of breath. 05/04/15  Yes Elsie Stain, MD  alendronate (FOSAMAX) 70 MG tablet Take 70 mg by mouth once a week. Take with a full glass of water on an empty stomach.   Yes Historical Provider, MD  aspirin 81 MG tablet Take 81 mg by mouth daily.   Yes Historical Provider, MD  Calcium Carbonate-Vitamin D 600-400 MG-UNIT per tablet Take 1 tablet by mouth 2 (two) times daily.   Yes Historical Provider, MD  Cholecalciferol (VITAMIN D PO) Take 5,000 Units by mouth daily.   Yes Historical Provider, MD  cyanocobalamin 500 MCG tablet Take 500 mcg by mouth daily.   Yes Historical Provider, MD  escitalopram (LEXAPRO) 20 MG tablet Take 20 mg by mouth at bedtime.   Yes Historical Provider, MD  furosemide (LASIX) 80 MG tablet Take 1 tablet by mouth daily. 11/06/14  Yes Historical Provider, MD  guaiFENesin (MUCINEX) 600 MG 12 hr tablet Take 600 mg by mouth 2 (two) times daily  as needed for cough or to loosen phlegm.    Yes Historical Provider, MD  ipratropium-albuterol (DUONEB) 0.5-2.5 (3) MG/3ML SOLN Take 3 mLs by nebulization every 4 (four) hours as needed (for breathing).   Yes Historical Provider, MD  linaclotide (LINZESS) 145 MCG CAPS capsule Take 145 mcg by mouth daily before breakfast.   Yes Historical Provider, MD  LORazepam (ATIVAN) 1 MG tablet Take 1 tablet (1 mg total) by mouth every 8 (eight) hours as needed for anxiety. 09/01/16  Yes Verlee Monte, MD  metoprolol tartrate (LOPRESSOR) 25 MG tablet Take 1 tablet by mouth 2 (two) times daily. 11/06/14  Yes Historical Provider, MD  mometasone-formoterol (DULERA) 200-5 MCG/ACT AERO Inhale 2 puffs into the lungs 2 (two) times daily.   Yes Historical Provider, MD  omeprazole (PRILOSEC) 40 MG  capsule Take 1 capsule by mouth daily. 11/06/14  Yes Historical Provider, MD  oxyCODONE-acetaminophen (PERCOCET/ROXICET) 5-325 MG tablet Take 1 tablet by mouth every 6 (six) hours as needed for moderate pain. 09/01/16  Yes Verlee Monte, MD  potassium chloride SA (K-DUR,KLOR-CON) 20 MEQ tablet Take 1 tablet by mouth 2 (two) times daily. 11/06/14  Yes Historical Provider, MD  ranitidine (ZANTAC) 150 MG tablet Take 1 tablet by mouth 2 (two) times daily. 11/06/14  Yes Historical Provider, MD  temazepam (RESTORIL) 15 MG capsule Take 1 capsule (15 mg total) by mouth at bedtime as needed for sleep. 09/01/16  Yes Verlee Monte, MD  Tiotropium Bromide Monohydrate 2.5 MCG/ACT AERS Inhale 2 puffs into the lungs daily.   Yes Historical Provider, MD  Vitamin D, Ergocalciferol, (DRISDOL) 50000 UNITS CAPS capsule Take 1 capsule by mouth every 7 (seven) days. 11/06/14  Yes Historical Provider, MD  ACCU-CHEK SMARTVIEW test strip  02/16/15   Historical Provider, MD  sulfamethoxazole-trimethoprim (BACTRIM DS,SEPTRA DS) 800-160 MG tablet Take 1 tablet by mouth 2 (two) times daily.    Historical Provider, MD    Physical Exam: Vitals:   12/03/16 1715 12/03/16 1730 12/03/16 1800 12/03/16 1808  BP: (!) 151/50 (!) 146/54 141/57 141/57  Pulse: (!) 130 (!) 124 115 (!) 140  Resp: 25 (!) _0 Temp:    100.6 F (38.1 C)  TempSrc:    Rectal  SpO2: 95% 94% 92% 95%    Constitutional: NAD, calm, comfortable Vitals:   12/03/16 1715 12/03/16 1730 12/03/16 1800 12/03/16 1808  BP: (!) 151/50 (!) 146/54 141/57 141/57  Pulse: (!) 130 (!) 124 115 (!) 140  Resp: 25 (!) _1 Temp:    100.6 F (38.1 C)  TempSrc:    Rectal  SpO2: 95% 94% 92% 95%   Eyes: PERRL, lids and conjunctivae normal ENMT: Mucous membranes are moist. Posterior pharynx clear of any exudate or lesions.Normal dentition.  Neck: normal, supple, no masses, no thyromegaly Respiratory: wheezing in upper lung lobes, no crackles   Cardiovascular: Regular rate  and rhythm, no murmurs / rubs / gallops. No extremity edema. 2+ pedal pulses. No carotid bruits.  Abdomen: no tenderness, no masses palpated. No hepatosplenomegaly. Bowel sounds positive.  Musculoskeletal: no clubbing / cyanosis. No joint deformity upper and lower extremities. Good ROM, no contractures. Normal muscle tone.  Skin: LE chronic venus stasis changes  Neurologic: CN 2-12 grossly intact. Sensation intact, DTR normal. Strength 5/5 in all 4.  Psychiatric: Normal judgment and insight. Alert and oriented x 3. Normal mood.    Labs on Admission: I have personally reviewed following labs and imaging studies  CBC:  Recent Labs  Lab 12/03/16 1545  WBC 14.0*  NEUTROABS 11.6*  HGB 9.1*  HCT 31.7*  MCV 84.3  PLT 062   Basic Metabolic Panel:  Recent Labs Lab 12/03/16 1545  NA 133*  K 3.5  CL 86*  CO2 34*  GLUCOSE 132*  BUN 11  CREATININE 1.67*  CALCIUM 8.7*   GFR: CrCl cannot be calculated (Unknown ideal weight.). Liver Function Tests:  Recent Labs Lab 12/03/16 1545  AST 19  ALT 9*  ALKPHOS 58  BILITOT 0.6  PROT 6.7  ALBUMIN 2.8*   No results for input(s): LIPASE, AMYLASE in the last 168 hours. No results for input(s): AMMONIA in the last 168 hours. Coagulation Profile: No results for input(s): INR, PROTIME in the last 168 hours. Cardiac Enzymes:  Recent Labs Lab 12/03/16 1545  TROPONINI 0.16*   BNP (last 3 results) No results for input(s): PROBNP in the last 8760 hours. HbA1C: No results for input(s): HGBA1C in the last 72 hours. CBG: No results for input(s): GLUCAP in the last 168 hours. Lipid Profile: No results for input(s): CHOL, HDL, LDLCALC, TRIG, CHOLHDL, LDLDIRECT in the last 72 hours. Thyroid Function Tests: No results for input(s): TSH, T4TOTAL, FREET4, T3FREE, THYROIDAB in the last 72 hours. Anemia Panel: No results for input(s): VITAMINB12, FOLATE, FERRITIN, TIBC, IRON, RETICCTPCT in the last 72 hours. Urine analysis:    Component  Value Date/Time   COLORURINE AMBER (A) 09/26/2016 2030   APPEARANCEUR CLOUDY (A) 09/26/2016 2030   LABSPEC 1.024 09/26/2016 2030   PHURINE 6.0 09/26/2016 2030   GLUCOSEU NEGATIVE 09/26/2016 2030   [place] (A) 09/26/2016 2030   BILIRUBINUR SMALL (A) 09/26/2016 2030   [place] 09/26/2016 2030   PROTEINUR 30 (A) 09/26/2016 2030   NITRITE NEGATIVE 09/26/2016 2030   LEUKOCYTESUR SMALL (A) 09/26/2016 2030   Sepsis Labs: _0 (procalcitonin:4,lacticidven:4) )No results found for this or any previous visit (from the past 240 hour(s)).   Radiological Exams on Admission: Dg Chest Port 1 View Result Date: 12/03/2016 Cardiomegaly. Bibasilar heterogeneous opacities favored to represent atelectasis. Infection not excluded. Electronically Signed   By: Lovey Newcomer M.D.   On: 12/03/2016 16:24    EKG: Independently reviewed. Sinus tachycardia, HR 112, atypical LBBB  Assessment/Plan  Principal Problem:   Acute on chronic respiratory failure with hypoxia (HCC) / COPD with acute exacerbation (HCC) / Oxygen dependent  - Acute respiratory failure with hypoxia likely due to combination of COPD and pneumonia - COPD gold / focused order set utilized - Continue nebulizer treatments: xopenex and Atrovent very 4 hours scheduled and duoneb every 4 hours as needed for shortness of breath or wheezing - Has received solumedrol 125 mg IV in ED and will continue 60 mg IV Q 12 hours - Continue BiPAP or [place] to keep O2 sat above 90%; currently on 5 L [place] oxygen support - Admission to SDU  Active Problems:   Sepsis due to pneumonia ([place]) / Leukocytosis - Sepsis criteria met on admission with fever, tachycardia, tachypnea, hypoxia, leukocytosis and source of infection likely pneumonia - CXR with atelectasis but infection cannot be ruled out - Sepsis and pneumonia order set utilized - Started rocephin and azithromycin - Follow up blood, respiratory and urine cx results - Follow up HIV, influenza,  legionella, strep pneumonia results    Troponin level elevated - Likely demand ischemia from hypoxia, CKD - No chest pain - The 12 lead EKG on admission showed sinus tachycardia, atypical LBBB - Cardio consulted by EDP - Last 2 D  ECHO in 08/2016 showed EF 45% with grade 1 DD - Cycle cardiac enzymes - Continue aspirin daily  - Will place order for ECHO    GERD (gastroesophageal reflux disease) - Continue PPI therapy    Essential hypertension - Resume metoprolol    Chronic combined systolic and diastolic CHF (congestive heart failure) ([place]) - As already noted 2 D ECHO in 08/2016 with EF 45% and grade 1 DD - Resume lasix - BNP on this admission in 160 range - Follow up 2 D ECHO results    Acute renal failure superimposed on stage 3 chronic kidney disease (HCC) - Baseline Cr  In 08/2016 was 1.2 - Acute elevation in Cr likely due to sepsis, acute infection but also possibly lasix - Will repeat BMP in am and if Cr trends up will consider holding lasix    Anemia of chronic kidney failure - Hemoglobin stable    Metabolic alkalosis - Due to lasix     Anxiety and depression - Continue lexapro and ativan every 8 hours PRN    Chronic venous stasis changes bilateral lower extremities - WOC consulted    DVT prophylaxis: SCD's bilaterally  Code Status: full code  Family Communication: No family at the bedside this am Disposition Plan: admission to SDU, pt intermittently required BiPAP and now on 5 L [place] but may require BiPAP at night  Consults called: cardiology  Admission status: inpatient, pt very il. Presented with shortness of breath, fever, tachycardia, tachypnea, hypoxia. Required BiPAP in ED but now on 5 L [place] although her O2 sat is in mid 90%. She was given nebulizer treatments and solumedrol in ED but continues to wheeze and has shortness of breath. She also had met sepsis criteria and was started on empiric rocephin and azithromycin. Her 12 lead EKG showed atypical LBBB and  she had mild trop elevation so will have to cycle cardiac enzymes and cardio will see her in consultation. She will require at least 4 days on inpatient stay to complete the work up and get blood, urine and resp cx results. Admission is to SDU as pt on high oxygen requirement and may easily require BiPAP at night.   Leisa Lenz MD [facility] Pager [phone]  If 7PM-7AM, please contact night-coverage [email or web address] Password [email or web address]  12/03/2016, 8:06 PM

## 2016-12-03 NOTE — Progress Notes (Signed)
Pharmacy Antibiotic Note  Amanda Horton is a 70 y.o. female admitted on 12/03/2016 with pneumonia.  Pharmacy has been consulted for rocephin/azithromycin dosing. 1st doses have been given in ED -WBC= 14, afebrile  Plan: -Azithromycin 500mg  IV q24h -Rocephin 1gm IV q24hr -Will sign off. Please contact pharmacy with any other needs.   Temp (24hrs), Avg:100.9 F (38.3 C), Min:100.6 F (38.1 C), Max:101.1 F (38.4 C)   Recent Labs Lab 12/03/16 1545 12/03/16 1551  WBC 14.0*  --   CREATININE 1.67*  --   LATICACIDVEN  --  1.58    CrCl cannot be calculated (Unknown ideal weight.).    Allergies  Allergen Reactions  . Sulfa Antibiotics Nausea And Vomiting and Other (See Comments)    "Stayed sick"    Antimicrobials this admission: 12/31 rocephin 12/31 azith>>  Dose adjustments this admission:  Microbiology results: 12/31 blood x2 12/31 urine  Thank you for allowing pharmacy to be a part of this patient's care.  Hildred Laser, Pharm D 12/03/2016 8:08 PM

## 2016-12-03 NOTE — Progress Notes (Signed)
PHARMACIST - PHYSICIAN COMMUNICATION  CONCERNING: P&T Medication Policy Regarding Oral Bisphosphonates  RECOMMENDATION: Your order for alendronate (Fosamax), ibandronate (Boniva), or risedronate (Actonel) has been discontinued at this time.  If the patient's post-hospital medical condition warrants safe use of this class of drugs, please resume the pre-hospital regimen upon discharge.  DESCRIPTION:  Alendronate (Fosamax), ibandronate (Boniva), and risedronate (Actonel) can cause severe esophageal erosions in patients who are unable to remain upright at least 30 minutes after taking this medication.   Since brief interruptions in therapy are thought to have minimal impact on bone mineral density, the [place] has established that bisphosphonate orders should be routinely discontinued during hospitalization.   To override this safety policy and permit administration of Boniva, Fosamax, or Actonel in the hospital, prescribers must write "DO NOT HOLD" in the comments section when placing the order for this class of medications.   Sherlon Handing, PharmD, BCPS Clinical pharmacist, pager [phone] 12/03/2016 10:02 PM

## 2016-12-03 NOTE — ED Notes (Signed)
Pt maintaining o2 sats of >95 on nasal cannula 5L. Per EDP ok to eat at this time.

## 2016-12-03 NOTE — ED Provider Notes (Signed)
[facility] DEPT Provider Note   CSN: [id number] Arrival date & time: 12/03/16  1514     History   Chief Complaint Chief Complaint  Patient presents with  . Shortness of Breath    HPI Amanda Horton is a 70 y.o. female.  70 year old female with a history of COPD and wears 2 L of oxygen at home with urgent care today secondary to multiple days of worsening cough shortness of breath. No fevers or fatigue gets worse and normal time. No chest pain. Has had some productive cough during that time. At urgent care apparently she had oxygen always that would dip down to 85% on her home oxygen so they sent her here for further evaluation. She had a breathing treatment and route. On my examination patient still complained of a hoarse voice, cough, mild shortness of breath. No chest pain, abdominal pain, back pain, nausea or vomiting this time. Had a similar symptoms before with COPD exacerbations. Has some lower extremity swelling but this is her baseline.    Shortness of Breath     Past Medical History:  Diagnosis Date  . Acute hyperkalemia   . Acute hypokalemia   . Acute on chronic renal failure ([place])   . Allergic rhinitis   . Anemia due to chronic kidney disease   . APC (atrial premature contractions)   . 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 Benign essential hypertension   . Borderline diabetes   . Chronic insomnia   . Chronic venous insufficiency   . COPD (chronic obstructive pulmonary disease) ([place])   . GERD (gastroesophageal reflux disease)   . Hyperlipemia   . Hypoxia   . Lumbar disc narrowing   . On home oxygen therapy   . OSA (obstructive sleep apnea)   . Osteoporosis   . Respiratory failure requiring intubation ([place])   . Spinal stenosis of lumbar region without neurogenic claudication   . Varicose veins with pain   . Vitamin B12 deficiency   . Vitamin D deficiency     Patient Active Problem List   Diagnosis Date Noted  . Degloving injury 08/27/2016  .  Depression 08/27/2016  . Essential hypertension 08/27/2016  . Osteoporosis 08/27/2016  . AKI (acute kidney injury) ([place]) 08/27/2016  . Degloving injury of lower leg   . Fall   . Vitamin B12 deficiency   . Borderline diabetes   . Hyperlipemia   . GERD (gastroesophageal reflux disease)   . COPD (chronic obstructive pulmonary disease) ([place])   . OSA (obstructive sleep apnea)   . Varicose veins with pain   . Chronic venous insufficiency     Past Surgical History:  Procedure Laterality Date  . APPLICATION OF A-CELL OF EXTREMITY Right 08/30/2016   Procedure: APPLICATION OF A-CELL OF EXTREMITY;  Surgeon: Loel Lofty Dillingham, DO;  Location: [facility];  Service: Plastics;  Laterality: Right;  . APPLICATION OF A-CELL OF EXTREMITY Right 09/20/2016   Procedure: APPLICATION OF A-CELL OF right lower leg wound;  Surgeon: Loel Lofty Dillingham, DO;  Location: [facility];  Service: Plastics;  Laterality: Right;  . APPLICATION OF WOUND VAC Right 08/30/2016   Procedure: APPLICATION OF WOUND VAC;  Surgeon: Loel Lofty Dillingham, DO;  Location: [facility];  Service: Plastics;  Laterality: Right;  . APPLICATION OF WOUND VAC Right 09/20/2016   Procedure: APPLICATION OF WOUND VAC right lower leg wound;  Surgeon: Loel Lofty Dillingham, DO;  Location: [facility];  Service: Plastics;  Laterality: Right;  . I&D EXTREMITY Right 08/30/2016  Procedure: IRRIGATION AND DEBRIDEMENT EXTREMITY RIGHT;  Surgeon: Loel Lofty Dillingham, DO;  Location: [facility];  Service: Plastics;  Laterality: Right;  . INCISION AND DRAINAGE OF WOUND Right 09/20/2016   Procedure: IRRIGATION AND DEBRIDEMENT right lower leg wound;  Surgeon: Loel Lofty Dillingham, DO;  Location: [facility];  Service: Plastics;  Laterality: Right;  . PARTIAL HYSTERECTOMY      OB History    No data available       Home Medications    Prior to Admission medications   Medication Sig Start Date End Date Taking? Authorizing Provider  ACCU-CHEK FASTCLIX LANCETS [place]  02/16/15   Historical  Provider, MD  ACCU-CHEK SMARTVIEW test strip  02/16/15   Historical Provider, MD  albuterol (PROVENTIL HFA;VENTOLIN HFA) 108 (90 BASE) MCG/ACT inhaler Inhale 2 puffs into the lungs every 6 (six) hours as needed for wheezing or shortness of breath.    Historical Provider, MD  albuterol (PROVENTIL) (2.5 MG/3ML) 0.083% nebulizer solution Take 3 mLs (2.5 mg total) by nebulization every 6 (six) hours as needed for wheezing or shortness of breath. 05/04/15   Elsie Stain, MD  alendronate (FOSAMAX) 70 MG tablet Take 70 mg by mouth once a week. Take with a full glass of water on an empty stomach.    Historical Provider, MD  aspirin 81 MG tablet Take 81 mg by mouth daily.    Historical Provider, MD  Calcium Carbonate-Vitamin D 600-400 MG-UNIT per tablet Take 1 tablet by mouth 2 (two) times daily.    Historical Provider, MD  Cholecalciferol (VITAMIN D PO) Take 5,000 Units by mouth daily.    Historical Provider, MD  cyanocobalamin 500 MCG tablet Take 500 mcg by mouth daily.    Historical Provider, MD  escitalopram (LEXAPRO) 20 MG tablet Take 20 mg by mouth at bedtime.    Historical Provider, MD  furosemide (LASIX) 80 MG tablet Take 1 tablet by mouth daily. 11/06/14   Historical Provider, MD  guaiFENesin (MUCINEX) 600 MG 12 hr tablet Take 600 mg by mouth 2 (two) times daily.    Historical Provider, MD  ipratropium-albuterol (DUONEB) 0.5-2.5 (3) MG/3ML SOLN Take 3 mLs by nebulization every 4 (four) hours as needed (for breathing).    Historical Provider, MD  linaclotide (LINZESS) 145 MCG CAPS capsule Take 145 mcg by mouth daily before breakfast.    Historical Provider, MD  LORazepam (ATIVAN) 1 MG tablet Take 1 tablet (1 mg total) by mouth every 8 (eight) hours as needed for anxiety. 09/01/16   Verlee Monte, MD  metoprolol tartrate (LOPRESSOR) 25 MG tablet Take 1 tablet by mouth 2 (two) times daily. 11/06/14   Historical Provider, MD  mometasone-formoterol (DULERA) 200-5 MCG/ACT AERO Inhale 2 puffs into the lungs 2  (two) times daily.    Historical Provider, MD  omeprazole (PRILOSEC) 40 MG capsule Take 1 capsule by mouth daily. 11/06/14   Historical Provider, MD  oxyCODONE-acetaminophen (PERCOCET/ROXICET) 5-325 MG tablet Take 1 tablet by mouth every 6 (six) hours as needed for moderate pain. 09/01/16   Verlee Monte, MD  potassium chloride SA (K-DUR,KLOR-CON) 20 MEQ tablet Take 1 tablet by mouth 2 (two) times daily. 11/06/14   Historical Provider, MD  ranitidine (ZANTAC) 150 MG tablet Take 1 tablet by mouth 2 (two) times daily. 11/06/14   Historical Provider, MD  temazepam (RESTORIL) 15 MG capsule Take 1 capsule (15 mg total) by mouth at bedtime as needed for sleep. 09/01/16   Verlee Monte, MD  Tiotropium Bromide Monohydrate 2.5 MCG/ACT AERS  Inhale 2 puffs into the lungs daily.    Historical Provider, MD  Vitamin D, Ergocalciferol, (DRISDOL) 50000 UNITS CAPS capsule Take 1 capsule by mouth every 7 (seven) days. 11/06/14   Historical Provider, MD    Family History Family History  Problem Relation Age of Onset  . Emphysema Father   . Asthma Father   . Throat cancer Mother     Social History Social History  Substance Use Topics  . Smoking status: Former Smoker    Packs/day: 1.00    Years: 55.00    Types: Cigarettes    Quit date: 12/04/2012  . Smokeless tobacco: Never Used     Comment: Stopped in 2014  . Alcohol use No     Allergies   No known allergies   Review of Systems Review of Systems  Respiratory: Positive for shortness of breath.   All other systems reviewed and are negative.    Physical Exam Updated Vital Signs SpO2 94% Comment: 94% on 2L uses 2 L [place] at home  Physical Exam  Constitutional: She appears well-developed and well-nourished.  HENT:  Head: Normocephalic and atraumatic.  Eyes: Conjunctivae and EOM are normal.  Neck: Normal range of motion.  Cardiovascular: Regular rhythm.  Tachycardia present.   Pulmonary/Chest: No stridor. Tachypnea noted. No respiratory distress. She  has decreased breath sounds. She has wheezes.  Abdominal: Soft. She exhibits no distension. There is no tenderness.  Musculoskeletal: She exhibits no edema or deformity.  Neurological: She is alert.  Skin: Skin is warm and dry. Rash (bilateral lower extremities.) noted.  Nursing note and vitals reviewed.    ED Treatments / Results  Labs (all labs ordered are listed, but only abnormal results are displayed) Labs Reviewed  COMPREHENSIVE METABOLIC PANEL - Abnormal; Notable for the following:       Result Value   Sodium 133 (*)    Chloride 86 (*)    CO2 34 (*)    Glucose, Bld 132 (*)    Creatinine, Ser 1.67 (*)    Calcium 8.7 (*)    Albumin 2.8 (*)    ALT 9 (*)    GFR calc non Af Amer 30 (*)    GFR calc Af Amer 35 (*)    All other components within normal limits  CBC WITH DIFFERENTIAL/PLATELET - Abnormal; Notable for the following:    WBC 14.0 (*)    RBC 3.76 (*)    Hemoglobin 9.1 (*)    HCT 31.7 (*)    MCH 24.2 (*)    MCHC 28.7 (*)    Neutro Abs 11.6 (*)    Monocytes Absolute 1.5 (*)    All other components within normal limits  URINALYSIS, ROUTINE W REFLEX MICROSCOPIC - Abnormal; Notable for the following:    APPearance HAZY (*)    Hgb urine dipstick MODERATE (*)    Protein, ur 100 (*)    Bacteria, UA FEW (*)    Squamous Epithelial / LPF 6-30 (*)    All other components within normal limits  BRAIN NATRIURETIC PEPTIDE - Abnormal; Notable for the following:    B Natriuretic Peptide 165.1 (*)    All other components within normal limits  TROPONIN I - Abnormal; Notable for the following:    Troponin I 0.16 (*)    All other components within normal limits  TROPONIN I - Abnormal; Notable for the following:    Troponin I 0.09 (*)    All other components within normal limits  TROPONIN I -  Abnormal; Notable for the following:    Troponin I 0.03 (*)    All other components within normal limits  COMPREHENSIVE METABOLIC PANEL - Abnormal; Notable for the following:     Chloride 88 (*)    CO2 36 (*)    Glucose, Bld 154 (*)    Creatinine, Ser 1.75 (*)    Calcium 8.6 (*)    Total Protein 6.1 (*)    Albumin 2.5 (*)    ALT 9 (*)    GFR calc non Af Amer 28 (*)    GFR calc Af Amer 33 (*)    All other components within normal limits  CBC - Abnormal; Notable for the following:    WBC 11.0 (*)    RBC 3.47 (*)    Hemoglobin 8.4 (*)    HCT 29.1 (*)    MCH 24.2 (*)    MCHC 28.9 (*)    All other components within normal limits  GLUCOSE, CAPILLARY - Abnormal; Notable for the following:    Glucose-Capillary 155 (*)    All other components within normal limits  I-STAT ARTERIAL BLOOD GAS, ED - Abnormal; Notable for the following:    pCO2 arterial 76.8 (*)    pO2, Arterial 112.0 (*)    Bicarbonate 42.2 (*)    Acid-Base Excess 14.0 (*)    All other components within normal limits  MRSA PCR SCREENING  CULTURE, BLOOD (ROUTINE X 2)  CULTURE, BLOOD (ROUTINE X 2)  URINE CULTURE  GRAM STAIN  CULTURE, EXPECTORATED SPUTUM-ASSESSMENT  RESPIRATORY PANEL BY PCR  PROCALCITONIN  STREP PNEUMONIAE URINARY ANTIGEN  INFLUENZA PANEL BY PCR (TYPE A & B, H1N1)  HIV ANTIBODY (ROUTINE TESTING)  INFLUENZA PANEL BY PCR (TYPE A & B, H1N1)  LEGIONELLA PNEUMOPHILA SEROGP 1 UR AG  TROPONIN I  I-STAT CG4 LACTIC ACID, ED    EKG  EKG Interpretation None       Radiology Dg Chest Port 1 View  Result Date: 12/03/2016 CLINICAL DATA:  Shortness of breath. EXAM: PORTABLE CHEST 1 VIEW COMPARISON:  Chest radiograph 09/26/2016. FINDINGS: Monitoring leads overlie the patient. Stable cardiomegaly. Heterogeneous opacities within the lung bases bilaterally. No pleural effusion or pneumothorax. IMPRESSION: Cardiomegaly. Bibasilar heterogeneous opacities favored to represent atelectasis. Infection not excluded. Electronically Signed   By: Lovey Newcomer M.D.   On: 12/03/2016 16:24    Procedures Procedures (including critical care time)  CRITICAL CARE Performed by: Merrily Pew Total  critical care time: 45 minutes Critical care time was exclusive of separately billable procedures and treating other patients. Critical care was necessary to treat or prevent imminent or life-threatening deterioration. Critical care was time spent personally by me on the following activities: development of treatment plan with patient and/or surrogate as well as nursing, discussions with consultants, evaluation of patient's response to treatment, examination of patient, obtaining history from patient or surrogate, ordering and performing treatments and interventions, ordering and review of laboratory studies, ordering and review of radiographic studies, pulse oximetry and re-evaluation of patient's condition.   Medications Ordered in ED Medications  sodium chloride 0.9 % bolus 1,000 mL (not administered)  albuterol (PROVENTIL,VENTOLIN) solution continuous neb (not administered)  ipratropium (ATROVENT) nebulizer solution 1 mg (not administered)  methylPREDNISolone sodium succinate (SOLU-MEDROL) 125 mg/2 mL injection 125 mg (not administered)     Initial Impression / Assessment and Plan / ED Course  I have reviewed the triage vital signs and the nursing notes.  Pertinent labs & imaging results that were available during my care  of the patient were reviewed by me and considered in my medical decision making (see chart for details).  Clinical Course    Suspect likely COPD exacerbation. We'll evaluate for pneumonia and she is little tachycardic however this could be related to the albuterol that she seemingly here. Urgentcare document dictation says 85% however initially or on her liters of oxygen she is at 90%. We'll continue to monitor that as well. Overall patient appears well. Feel similar to baseline so she may go to go home but we will see she does with albuterol and steroids as she is pretty diminished.  Called the patient's bedside for increased work of breathing. On reevaluation she still  has decreased breath sounds. She didn't get in a liter bolus and her continuous albuterol when this happened. Her heart rate is similar to what was before however now she is sitting up in bed and tachypnea. No hypoxia. View chest x-ray does appear she may be a little bit fluid overloaded however not significantly so. Recent EF of 45-50% so unsure if this is pulmonary edema or worsening COPD. We'll start BiPAP and add on BNP and troponin to her previous labs. We'll also get an ABG and reevaluate in 15 minutes. ABX started as well.  After a couple hours of improving symptoms, transitioned back to [place]. Had a couple episodes of tachycardic events, discussed with cardiology who agrees it looks like atrial tachycardia, likely 2/2 pulmonary issues.  Plan for admit to medicine.  Medicine requesting cardiology consult. Paged.  Paged cardiology again, no response.     Final Clinical Impressions(s) / ED Diagnoses   Final diagnoses:  Hypoxia  Community acquired pneumonia, unspecified laterality  Acute respiratory failure with hypoxia and hypercapnia (HCC)    New Prescriptions New Prescriptions   No medications on file     Merrily Pew, MD 12/04/16 1232

## 2016-12-03 NOTE — ED Notes (Signed)
[place] (daughter)

## 2016-12-04 ENCOUNTER — Encounter (HOSPITAL_COMMUNITY): Payer: Self-pay | Admitting: *Deleted

## 2016-12-04 LAB — RESPIRATORY PANEL BY PCR
Adenovirus: NOT DETECTED
BORDETELLA PERTUSSIS-RVPCR: NOT DETECTED
CHLAMYDOPHILA PNEUMONIAE-RVPPCR: NOT DETECTED
CORONAVIRUS 229E-RVPPCR: NOT DETECTED
CORONAVIRUS HKU1-RVPPCR: NOT DETECTED
Coronavirus NL63: NOT DETECTED
Coronavirus OC43: NOT DETECTED
INFLUENZA B-RVPPCR: NOT DETECTED
Influenza A: NOT DETECTED
MYCOPLASMA PNEUMONIAE-RVPPCR: NOT DETECTED
Metapneumovirus: NOT DETECTED
PARAINFLUENZA VIRUS 2-RVPPCR: NOT DETECTED
Parainfluenza Virus 1: NOT DETECTED
Parainfluenza Virus 3: NOT DETECTED
Parainfluenza Virus 4: NOT DETECTED
RESPIRATORY SYNCYTIAL VIRUS-RVPPCR: NOT DETECTED
Rhinovirus / Enterovirus: NOT DETECTED

## 2016-12-04 LAB — CBC
HCT: 29.1 % — ABNORMAL LOW (ref 36.0–46.0)
Hemoglobin: 8.4 g/dL — ABNORMAL LOW (ref 12.0–15.0)
MCH: 24.2 pg — ABNORMAL LOW (ref 26.0–34.0)
MCHC: 28.9 g/dL — ABNORMAL LOW (ref 30.0–36.0)
MCV: 83.9 fL (ref 78.0–100.0)
Platelets: 258 10*3/uL (ref 150–400)
RBC: 3.47 MIL/uL — ABNORMAL LOW (ref 3.87–5.11)
RDW: 15.3 % (ref 11.5–15.5)
WBC: 11 10*3/uL — AB (ref 4.0–10.5)

## 2016-12-04 LAB — TROPONIN I
TROPONIN I: 0.09 ng/mL — AB (ref <0.03)
Troponin I: 0.03 ng/mL (ref <0.03)
Troponin I: 0.09 ng/mL (ref <0.03)

## 2016-12-04 LAB — COMPREHENSIVE METABOLIC PANEL
ALK PHOS: 52 U/L (ref 38–126)
ALT: 9 U/L — ABNORMAL LOW (ref 14–54)
ANION GAP: 13 (ref 5–15)
AST: 16 U/L (ref 15–41)
Albumin: 2.5 g/dL — ABNORMAL LOW (ref 3.5–5.0)
BILIRUBIN TOTAL: 0.4 mg/dL (ref 0.3–1.2)
BUN: 14 mg/dL (ref 6–20)
CALCIUM: 8.6 mg/dL — AB (ref 8.9–10.3)
CO2: 36 mmol/L — ABNORMAL HIGH (ref 22–32)
Chloride: 88 mmol/L — ABNORMAL LOW (ref 101–111)
Creatinine, Ser: 1.75 mg/dL — ABNORMAL HIGH (ref 0.44–1.00)
GFR calc Af Amer: 33 mL/min — ABNORMAL LOW (ref >60)
GFR, EST NON AFRICAN AMERICAN: 28 mL/min — AB (ref >60)
Glucose, Bld: 154 mg/dL — ABNORMAL HIGH (ref 65–99)
POTASSIUM: 3.7 mmol/L (ref 3.5–5.1)
Sodium: 137 mmol/L (ref 135–145)
TOTAL PROTEIN: 6.1 g/dL — AB (ref 6.5–8.1)

## 2016-12-04 LAB — INFLUENZA PANEL BY PCR (TYPE A & B)
INFLAPCR: NEGATIVE
INFLBPCR: NEGATIVE

## 2016-12-04 LAB — STREP PNEUMONIAE URINARY ANTIGEN: STREP PNEUMO URINARY ANTIGEN: NEGATIVE

## 2016-12-04 LAB — HIV ANTIBODY (ROUTINE TESTING W REFLEX): HIV SCREEN 4TH GENERATION: NONREACTIVE

## 2016-12-04 LAB — GLUCOSE, CAPILLARY: GLUCOSE-CAPILLARY: 155 mg/dL — AB (ref 65–99)

## 2016-12-04 LAB — MRSA PCR SCREENING: MRSA BY PCR: NEGATIVE

## 2016-12-04 MED ORDER — ORAL CARE MOUTH RINSE
15.0000 mL | Freq: Two times a day (BID) | OROMUCOSAL | Status: DC
  Administered 2016-12-04 – 2016-12-13 (×6): 15 mL via OROMUCOSAL

## 2016-12-04 MED ORDER — LEVALBUTEROL HCL 1.25 MG/0.5ML IN NEBU
1.2500 mg | INHALATION_SOLUTION | RESPIRATORY_TRACT | Status: DC
  Administered 2016-12-04 – 2016-12-05 (×8): 1.25 mg via RESPIRATORY_TRACT
  Filled 2016-12-04 (×8): qty 0.5

## 2016-12-04 NOTE — Evaluation (Signed)
Physical Therapy Evaluation Patient Details Name: Amanda Horton MRN: [id number] DOB: May 27, 1946 Today's Date: 12/04/2016   History of Present Illness  Amanda Horton is a 71 y.o. female with medical history significant for COPD on 2 L oxygen at home, OSA, CHF , CKD stage 3, HTN, depression and anxiety. She presented to [facility] with worsening shortness of breath at rest and with exertion over past few days prior to this admission  Clinical Impression  Pt admitted with above diagnosis. Pt currently with functional limitations due to the deficits listed below (see PT Problem List). At the time of PT eval pt was able to perform transfers and ambulation with min guard assist for balance support and safety. O2 status and DOE during session was limiting ability to perform functional mobility. However, pt reports she was not very active at home and appears that she is mostly sedentary - only walking about 15-20 feet at a time before requiring a rest break. Pt will benefit from skilled PT to increase their independence and safety with mobility to allow discharge to the venue listed below.       Follow Up Recommendations Home health PT;Supervision for mobility/OOB    Equipment Recommendations  None recommended by PT    Recommendations for Other Services       Precautions / Restrictions Precautions Precautions: Fall Precaution Comments: DOE 4/4--monitor sats; on 2 liters at rest (91%) had to turn up to 4 liters (for ambulation around bed to keep in 90's)--end of session back on 2 liters with pt in recliner Restrictions Weight Bearing Restrictions: No      Mobility  Bed Mobility Overal bed mobility: Modified Independent             General bed mobility comments: increased time, HOB up and use of rail  Transfers Overall transfer level: Needs assistance Equipment used: Rolling walker (2 wheeled) Transfers: Sit to/from Stand Sit to Stand: Min guard         General transfer comment: Pt  demonstrated proper hand placement on seated surface for safety. Close guard as she powered-up to full standing position.   Ambulation/Gait Ambulation/Gait assistance: Min guard Ambulation Distance (Feet): 15 Feet Assistive device: Rolling walker (2 wheeled) Gait Pattern/deviations: Step-through pattern;Decreased stride length;Trendelenburg Gait velocity: Decreased Gait velocity interpretation: Below normal speed for age/gender General Gait Details: Pt ambulated around the bed to the chair with hands-on guarding for safety. Increased time required, and supplemental O2 was increased from 2L/min to 4L/min for OOB activity. Sats remained 88% during ambulation and decreased to 86% upon seated rest, with ~2 minutes required to improve sats back up into the 90's.   Stairs            Wheelchair Mobility    Modified Rankin (Stroke Patients Only)       Balance Overall balance assessment: Needs assistance Sitting-balance support: No upper extremity supported;Feet supported Sitting balance-Leahy Scale: Good     Standing balance support: Bilateral upper extremity supported Standing balance-Leahy Scale: Poor                               Pertinent Vitals/Pain Pain Assessment: No/denies pain    Home Living Family/patient expects to be discharged to:: Private residence Living Arrangements: Children Available Help at Discharge: Family;Available PRN/intermittently Type of Home: House Home Access: Ramped entrance Entrance Stairs-Rails: None Entrance Stairs-Number of Steps: 2 Home Layout: Two level;Able to live on main level with bedroom/bathroom Home Equipment:  Walker - 2 wheels;Walker - 4 wheels;Shower seat;Grab bars - tub/shower Additional Comments: Family works during the day; available at night and intermittently    Prior Function Level of Independence: Needs assistance   Gait / Transfers Assistance Needed: Used the walker  ADL's / Homemaking Assistance Needed: Pt  reports duaghter helps with bath and son helps to don/doff shoes/socks        Hand Dominance   Dominant Hand: Right    Extremity/Trunk Assessment   Upper Extremity Assessment Upper Extremity Assessment: Generalized weakness    Lower Extremity Assessment Lower Extremity Assessment: Defer to PT evaluation       Communication   Communication: No difficulties  Cognition Arousal/Alertness: Awake/alert Behavior During Therapy: WFL for tasks assessed/performed Overall Cognitive Status: Within Functional Limits for tasks assessed                      General Comments      Exercises     Assessment/Plan    PT Assessment Patient needs continued PT services  PT Problem List Decreased strength;Decreased range of motion;Decreased activity tolerance;Decreased balance;Decreased mobility;Decreased knowledge of use of DME;Decreased safety awareness;Decreased knowledge of precautions;Cardiopulmonary status limiting activity          PT Treatment Interventions DME instruction;Gait training;Stair training;Functional mobility training;Therapeutic activities;Therapeutic exercise;Neuromuscular re-education;Patient/family education    PT Goals (Current goals can be found in the Care Plan section)  Acute Rehab PT Goals Patient Stated Goal: home soon PT Goal Formulation: With patient Time For Goal Achievement: 12/11/16 Potential to Achieve Goals: Good    Frequency Min 3X/week   Barriers to discharge        Co-evaluation PT/OT/SLP Co-Evaluation/Treatment: Yes Reason for Co-Treatment: To address functional/ADL transfers PT goals addressed during session: Mobility/safety with mobility;Balance;Proper use of DME OT goals addressed during session: Strengthening/ROM       End of Session Equipment Utilized During Treatment: Gait belt;Oxygen Activity Tolerance: Patient limited by fatigue (DOE) Patient left: in chair;with call bell/phone within reach;with chair alarm set Nurse  Communication: Mobility status         Time: [id number] PT Time Calculation (min) (ACUTE ONLY): 25 min   Charges:   PT Evaluation $PT Eval Moderate Complexity: 1 Procedure     PT G Codes:        Thelma Comp Dec 25, 2016, 1:59 PM   Rolinda Roan, PT, DPT Acute Rehabilitation Services Pager: [phone]

## 2016-12-04 NOTE — Evaluation (Signed)
Occupational Therapy Evaluation Patient Details Name: Amanda Horton MRN: [id number] DOB: [birth year] Today's Date: 12/04/2016    History of Present Illness Amanda Horton is a 71 y.o. female with medical history significant for COPD on 2 L oxygen at home, OSA, CHF , CKD stage 3, HTN, depression and anxiety. She presented to [facility] with worsening shortness of breath at rest and with exertion over past few days prior to this admission   Clinical Impression   This 71 yo female admitted with above presents to acute OT deficits below (see OT problem list) thus affecting her PLOF. She will benefit from acute OT with follow up [place] to get back to PLOF and be able to be alone during the day as she was pta.    Follow Up Recommendations  Home health OT;Supervision/Assistance - 24 hour;Other (comment) (24/7 initially until family feel she is good to leave alone as she was pta))    Equipment Recommendations  None recommended by OT       Precautions / Restrictions Precautions Precautions: Fall Precaution Comments: DOE 4/4--monitor sats; on 2 liters at rest (91%) had to turn up to 4 liters (for ambulation around bed to keep in 90's)--end of session back on 2 liters with pt in recliner Restrictions Weight Bearing Restrictions: No      Mobility Bed Mobility Overal bed mobility: Modified Independent             General bed mobility comments: increased time, HOB up and use of rail  Transfers Overall transfer level: Needs assistance Equipment used: Rolling walker (2 wheeled) Transfers: Sit to/from Stand Sit to Stand: Min guard              Balance Overall balance assessment: Needs assistance Sitting-balance support: No upper extremity supported;Feet supported Sitting balance-Leahy Scale: Good     Standing balance support: Bilateral upper extremity supported Standing balance-Leahy Scale: Poor                              ADL Overall ADL's : Needs  assistance/impaired Eating/Feeding: Independent;Sitting   Grooming: Set up;Sitting   Upper Body Bathing: Set up;Sitting   Lower Body Bathing: Moderate assistance (min guard A sit<>stand)   Upper Body Dressing : Set up;Supervision/safety;Sitting   Lower Body Dressing: Maximal assistance (min guard A sit<>stand. pt reports she has a sock aid at home that she uses (and a reacher too--but does not use it for dressing))   Toilet Transfer: Min guard;Ambulation Toilet Transfer Details (indicate cue type and reason): bed>around to recliner on other side Toileting- Clothing Manipulation and Hygiene: Moderate assistance (min guard A sit<>stand)         General ADL Comments: All activities with increased time due to DOE--educated her on purse lipped breathing (needed verbal cues to continue)               Pertinent Vitals/Pain Pain Assessment: No/denies pain        Extremity/Trunk Assessment Upper Extremity Assessment Upper Extremity Assessment: Generalized weakness   Lower Extremity Assessment Lower Extremity Assessment: Defer to PT evaluation          Cognition Arousal/Alertness: Awake/alert Behavior During Therapy: WFL for tasks assessed/performed Overall Cognitive Status: Within Functional Limits for tasks assessed                                Home Living Family/patient expects to  be discharged to:: Private residence Living Arrangements: Children Available Help at Discharge: Family;Available PRN/intermittently Type of Home: House Home Access: Ramped entrance     Home Layout: Two level;Able to live on main level with bedroom/bathroom Alternate Level Stairs-Number of Steps: 15   Bathroom Shower/Tub: Teacher, early years/pre: Standard     Home Equipment: Environmental consultant - 2 wheels;Walker - 4 wheels;Shower seat;Grab bars - tub/shower   Additional Comments: Family works during the day; available at night and intermittently      Prior  Functioning/Environment Level of Independence: Needs assistance  Gait / Transfers Assistance Needed: Used the walker ADL's / Homemaking Assistance Needed: Pt reports duaghter helps with bath and son helps to don/doff shoes/socks            OT Problem List: Decreased activity tolerance;Impaired balance (sitting and/or standing);Obesity;Cardiopulmonary status limiting activity   OT Treatment/Interventions: Self-care/ADL training;Therapeutic activities;Energy conservation;DME and/or AE instruction;Patient/family education;Balance training    OT Goals(Current goals can be found in the care plan section) Acute Rehab OT Goals Patient Stated Goal: home soon OT Goal Formulation: With patient Time For Goal Achievement: 12/18/16 Potential to Achieve Goals: Good  OT Frequency: Min 2X/week           Co-evaluation PT/OT/SLP Co-Evaluation/Treatment: Yes Reason for Co-Treatment: To address functional/ADL transfers;For patient/therapist safety   OT goals addressed during session: Strengthening/ROM      End of Session Equipment Utilized During Treatment: Gait belt;Rolling walker;Oxygen (2 liters at rest, 4 liters with ambulation)  Activity Tolerance: Other (comment) (limited by DOE (4/4 just walking around bed--pt reports this is normal)) Patient left: in chair;with call bell/phone within reach;with chair alarm set   Time: [id number] OT Time Calculation (min): 25 min Charges:  OT General Charges $OT Visit: 1 Procedure OT Evaluation $OT Eval Moderate Complexity: 1 Procedure  Almon Register [id number] 12/04/2016, 10:27 AM

## 2016-12-04 NOTE — Progress Notes (Signed)
Patient currently on [employer] with sats of 96%. Patient is in no distress and all vitals are stable. BIPAP not needed at this time. Will continue to monitor.

## 2016-12-04 NOTE — Progress Notes (Addendum)
[facility] PROGRESS NOTE  Amanda Horton [id number] DOB: December 05, 1945 DOA: 12/03/2016   PCP: Nicoletta Dress, MD     Assessment/Plan: Principal Problem:   Acute on chronic respiratory failure with hypoxia (HCC) Active Problems:   GERD (gastroesophageal reflux disease)   OSA (obstructive sleep apnea)   Essential hypertension   COPD with acute exacerbation (HCC)   Sepsis due to pneumonia (HCC)   Leukocytosis   Troponin level elevated   Chronic combined systolic and diastolic CHF (congestive heart failure) (HCC)   Acute renal failure superimposed on stage 3 chronic kidney disease (HCC)   Anemia of chronic kidney failure   Metabolic alkalosis   Anxiety and depression   Amanda Horton is a 71 y.o. female with medical history significant for COPD on 2 L oxygen at home, OSA, combined CHF (last 2 D ECHO in 08/2016 with EF 45% and grade 1 DD), CKD stage 3 with baseline Cr of 1.2 in 08/2016, hypertension, depression and anxiety. She presented to [facility] with worsening shortness of breath at rest and with exertion over past few days prior to this admission.She   continued to have shortness of breath and wheezing despite IV steroids. In addition, sepsis criteria met on admission with source likely pneumonia so azithromycin and rocephin started in ED.    Assessment and plan   Acute on chronic respiratory failure with hypoxia (HCC) / COPD with acute exacerbation (HCC) / Oxygen dependent  - Acute respiratory failure with hypoxia likely due to combination of COPD and pneumonia - COPD gold / focused order set utilized - Continue nebulizer treatments: xopenex and Atrovent very 4 hours scheduled and duoneb every 4 hours as needed for shortness of breath or wheezing - Has received solumedrol 125 mg IV in ED and will continue 60 mg IV Q 12 hours - Continue BiPAP or [place] to keep O2 sat above 90%; currently on 5 L [place] oxygen support. Baseline 2 L  continue stepdown    Active Problems:    Sepsis due to pneumonia ([place]) / Leukocytosis - Sepsis criteria met on admission with fever, tachycardia, tachypnea, hypoxia, leukocytosis and source of infection likely pneumonia - CXR with atelectasis but infection cannot be ruled out - Sepsis and pneumonia order set utilized Continue rocephin and azithromycin , day #2 - Follow up blood, respiratory and urine cx results - Follow up HIV, influenza negative, legionella pending, strep pneumonia negative    Troponin level elevated - Likely demand ischemia from hypoxia, CKD - No chest pain - The 12 lead EKG on admission showed sinus tachycardia, atypical LBBB - Cardio consulted by EDP - Last 2 D ECHO in 08/2016 showed EF 45% with grade 1 DD Troponin trending down - Continue aspirin daily  - Will place order for ECHO    GERD (gastroesophageal reflux disease) - Continue PPI therapy    Essential hypertension - Resume metoprolol    Chronic combined systolic and diastolic CHF (congestive heart failure) ([place]) - As already noted 2 D ECHO in 08/2016 with EF 45% and grade 1 DD - BNP on this admission in 160 range - Follow up 2 D ECHO results    Acute renal failure superimposed on stage 3 chronic kidney disease ([place]) - Baseline Cr  In 08/2016 was 1.2. Presented with a creatinine of 1.67 - Acute elevation in Cr likely due to sepsis, acute infection but also possibly lasix - Discontinue Lasix and monitor    Anemia of chronic kidney failure - Hemoglobin stable  Metabolic alkalosis - Due to lasix :    Anxiety and depression - Continue lexapro and ativan every 8 hours PRN    Chronic venous stasis changes bilateral lower extremities - WOC consulted     DVT prophylaxsis heparin  Code Status:  Full code     Family Communication: Discussed in detail with the patient, all imaging results, lab results explained to the patient   Disposition Plan:  1-2 days      Consultants:  None  Procedures:  None  Antibiotics: Anti-infectives    Start     Dose/Rate Route Frequency Ordered Stop   12/04/16 2000  cefTRIAXone (ROCEPHIN) 1 g in dextrose 5 % 50 mL IVPB     1 g 100 mL/hr over 30 Minutes Intravenous Every 24 hours 12/03/16 2010     12/04/16 2000  azithromycin (ZITHROMAX) 500 mg in dextrose 5 % 250 mL IVPB     500 mg 250 mL/hr over 60 Minutes Intravenous Every 24 hours 12/03/16 2010     12/03/16 2015  cefTRIAXone (ROCEPHIN) 1 g in dextrose 5 % 50 mL IVPB  Status:  Discontinued     1 g 100 mL/hr over 30 Minutes Intravenous  Once 12/03/16 2000 12/03/16 2244   12/03/16 2015  azithromycin (ZITHROMAX) 500 mg in dextrose 5 % 250 mL IVPB  Status:  Discontinued     500 mg 250 mL/hr over 60 Minutes Intravenous  Once 12/03/16 2000 12/03/16 2244   12/03/16 1645  cefTRIAXone (ROCEPHIN) 1 g in dextrose 5 % 50 mL IVPB     1 g 100 mL/hr over 30 Minutes Intravenous  Once 12/03/16 1631 12/03/16 1756   12/03/16 1645  azithromycin (ZITHROMAX) 500 mg in dextrose 5 % 250 mL IVPB     500 mg 250 mL/hr over 60 Minutes Intravenous  Once 12/03/16 1631 12/03/16 2016         HPI/Subjective:  Patient continues to have difficulty speaking in full sentences, dyspnea on minimal exertion  Objective: Vitals:   12/04/16 0600 12/04/16 0800 12/04/16 0819 12/04/16 1037  BP: (!) 105/56   136/62  Pulse: 78   98  Resp: 18     Temp:  97.5 F (36.4 C)    TempSrc:  Axillary    SpO2: 97%  97%   Weight:      Height:        Intake/Output Summary (Last 24 hours) at 12/04/16 1156 Last data filed at 12/04/16 1042  Gross per 24 hour  Intake             99.5 ml  Output              459 ml  Net           -359.5 ml    Exam:  Examination:  General exam: Appears calm and comfortable  Respiratory system: Clear to auscultation. Respiratory effort normal. Cardiovascular system: S1 & S2 heard, RRR. No JVD, murmurs, rubs, gallops or clicks. No pedal  edema. Gastrointestinal system: Abdomen is nondistended, soft and nontender. No organomegaly or masses felt. Normal bowel sounds heard. Central nervous system: Alert and oriented. No focal neurological deficits. Extremities: Bilateral 1+ pitting edema Skin: No rashes, lesions or ulcers Psychiatry: Judgement and insight appear normal. Mood & affect appropriate.     Data Reviewed: I have personally reviewed following labs and imaging studies  Micro Results Recent Results (from the past 240 hour(s))  MRSA PCR Screening     Status: None  Collection Time: 12/03/16  9:45 PM  Result Value Ref Range Status   MRSA by PCR NEGATIVE NEGATIVE Final    Comment:        The GeneXpert MRSA Assay (FDA approved for NASAL specimens only), is one component of a comprehensive MRSA colonization surveillance program. It is not intended to diagnose MRSA infection nor to guide or monitor treatment for MRSA infections.     Radiology Reports Dg Chest Port 1 View  Result Date: 12/03/2016 CLINICAL DATA:  Shortness of breath. EXAM: PORTABLE CHEST 1 VIEW COMPARISON:  Chest radiograph 09/26/2016. FINDINGS: Monitoring leads overlie the patient. Stable cardiomegaly. Heterogeneous opacities within the lung bases bilaterally. No pleural effusion or pneumothorax. IMPRESSION: Cardiomegaly. Bibasilar heterogeneous opacities favored to represent atelectasis. Infection not excluded. Electronically Signed   By: Lovey Newcomer M.D.   On: 12/03/2016 16:24     CBC  Recent Labs Lab 12/03/16 1545 12/04/16 0415  WBC 14.0* 11.0*  HGB 9.1* 8.4*  HCT 31.7* 29.1*  PLT 272 258  MCV 84.3 83.9  MCH 24.2* 24.2*  MCHC 28.7* 28.9*  RDW 15.3 15.3  LYMPHSABS 0.9  --   MONOABS 1.5*  --   EOSABS 0.0  --   BASOSABS 0.0  --     Chemistries   Recent Labs Lab 12/03/16 1545 12/04/16 0415  NA 133* 137  K 3.5 3.7  CL 86* 88*  CO2 34* 36*  GLUCOSE 132* 154*  BUN 11 14  CREATININE 1.67* 1.75*  CALCIUM 8.7* 8.6*  AST 19  16  ALT 9* 9*  ALKPHOS 58 52  BILITOT 0.6 0.4   ------------------------------------------------------------------------------------------------------------------ estimated creatinine clearance is 35.7 mL/min (by C-G formula based on SCr of 1.75 mg/dL (H)). ------------------------------------------------------------------------------------------------------------------ No results for input(s): HGBA1C in the last 72 hours. ------------------------------------------------------------------------------------------------------------------ No results for input(s): CHOL, HDL, LDLCALC, TRIG, CHOLHDL, LDLDIRECT in the last 72 hours. ------------------------------------------------------------------------------------------------------------------ No results for input(s): TSH, T4TOTAL, T3FREE, THYROIDAB in the last 72 hours.  Invalid input(s): FREET3 ------------------------------------------------------------------------------------------------------------------ No results for input(s): VITAMINB12, FOLATE, FERRITIN, TIBC, IRON, RETICCTPCT in the last 72 hours.  Coagulation profile No results for input(s): INR, PROTIME in the last 168 hours.  No results for input(s): DDIMER in the last 72 hours.  Cardiac Enzymes  Recent Labs Lab 12/03/16 1545 12/03/16 2158 12/04/16 0415  TROPONINI 0.16* 0.09* 0.03*   ------------------------------------------------------------------------------------------------------------------ Invalid input(s): POCBNP   CBG:  Recent Labs Lab 12/04/16 0818  GLUCAP 155*       Studies: Dg Chest Port 1 View  Result Date: 12/03/2016 CLINICAL DATA:  Shortness of breath. EXAM: PORTABLE CHEST 1 VIEW COMPARISON:  Chest radiograph 09/26/2016. FINDINGS: Monitoring leads overlie the patient. Stable cardiomegaly. Heterogeneous opacities within the lung bases bilaterally. No pleural effusion or pneumothorax. IMPRESSION: Cardiomegaly. Bibasilar heterogeneous opacities  favored to represent atelectasis. Infection not excluded. Electronically Signed   By: Lovey Newcomer M.D.   On: 12/03/2016 16:24      Lab Results  Component Value Date   HGBA1C 5.8 (H) 08/27/2016   Lab Results  Component Value Date   CREATININE 1.75 (H) 12/04/2016       Scheduled Meds: . aspirin  81 mg Oral Daily  . azithromycin  500 mg Intravenous Q24H  . calcium-vitamin D  1 tablet Oral BID WC  . cefTRIAXone (ROCEPHIN)  IV  1 g Intravenous Q24H  . docusate sodium  100 mg Oral Daily  . escitalopram  20 mg Oral QHS  . famotidine  20 mg Oral Daily  . furosemide  80 mg Oral Daily  . ipratropium  0.5 mg Nebulization Q4H  . levalbuterol  1.25 mg Nebulization Q4H  . linaclotide  145 mcg Oral QAC breakfast  . mouth rinse  15 mL Mouth Rinse BID  . methylPREDNISolone (SOLU-MEDROL) injection  60 mg Intravenous Q12H  . metoprolol tartrate  25 mg Oral BID  . pantoprazole  40 mg Oral Daily  . potassium chloride SA  20 mEq Oral BID  . sodium chloride flush  3 mL Intravenous Q12H  . cyanocobalamin  500 mcg Oral Daily  . Vitamin D (Ergocalciferol)  50,000 Units Oral Q7 days   Continuous Infusions: . sodium chloride 10 mL/hr at 12/03/16 2239     LOS: 1 day    Time spent: >30 MINS    [facility] Pager [phone]. If 7PM-7AM, please contact night-coverage at [email or web address], password [facility] 12/04/2016, 11:56 AM  LOS: 1 day

## 2016-12-05 ENCOUNTER — Other Ambulatory Visit (HOSPITAL_COMMUNITY): Payer: Commercial Managed Care - HMO

## 2016-12-05 ENCOUNTER — Inpatient Hospital Stay (HOSPITAL_COMMUNITY): Payer: Commercial Managed Care - HMO

## 2016-12-05 DIAGNOSIS — L97909 Non-pressure chronic ulcer of unspecified part of unspecified lower leg with unspecified severity: Secondary | ICD-10-CM

## 2016-12-05 DIAGNOSIS — I509 Heart failure, unspecified: Secondary | ICD-10-CM

## 2016-12-05 DIAGNOSIS — N184 Chronic kidney disease, stage 4 (severe): Secondary | ICD-10-CM

## 2016-12-05 DIAGNOSIS — I83009 Varicose veins of unspecified lower extremity with ulcer of unspecified site: Secondary | ICD-10-CM | POA: Diagnosis present

## 2016-12-05 DIAGNOSIS — D631 Anemia in chronic kidney disease: Secondary | ICD-10-CM | POA: Diagnosis present

## 2016-12-05 DIAGNOSIS — L97901 Non-pressure chronic ulcer of unspecified part of unspecified lower leg limited to breakdown of skin: Secondary | ICD-10-CM

## 2016-12-05 DIAGNOSIS — N171 Acute kidney failure with acute cortical necrosis: Secondary | ICD-10-CM

## 2016-12-05 DIAGNOSIS — R7989 Other specified abnormal findings of blood chemistry: Secondary | ICD-10-CM

## 2016-12-05 DIAGNOSIS — R778 Other specified abnormalities of plasma proteins: Secondary | ICD-10-CM

## 2016-12-05 LAB — CBC
HCT: 28 % — ABNORMAL LOW (ref 36.0–46.0)
Hemoglobin: 8.3 g/dL — ABNORMAL LOW (ref 12.0–15.0)
MCH: 24.7 pg — ABNORMAL LOW (ref 26.0–34.0)
MCHC: 29.6 g/dL — ABNORMAL LOW (ref 30.0–36.0)
MCV: 83.3 fL (ref 78.0–100.0)
PLATELETS: 274 10*3/uL (ref 150–400)
RBC: 3.36 MIL/uL — AB (ref 3.87–5.11)
RDW: 15.6 % — ABNORMAL HIGH (ref 11.5–15.5)
WBC: 11 10*3/uL — AB (ref 4.0–10.5)

## 2016-12-05 LAB — COMPREHENSIVE METABOLIC PANEL
ALT: 9 U/L — AB (ref 14–54)
ANION GAP: 11 (ref 5–15)
AST: 15 U/L (ref 15–41)
Albumin: 2.8 g/dL — ABNORMAL LOW (ref 3.5–5.0)
Alkaline Phosphatase: 49 U/L (ref 38–126)
BUN: 24 mg/dL — ABNORMAL HIGH (ref 6–20)
CHLORIDE: 88 mmol/L — AB (ref 101–111)
CO2: 36 mmol/L — ABNORMAL HIGH (ref 22–32)
CREATININE: 2.11 mg/dL — AB (ref 0.44–1.00)
Calcium: 8.4 mg/dL — ABNORMAL LOW (ref 8.9–10.3)
GFR, EST AFRICAN AMERICAN: 26 mL/min — AB (ref >60)
GFR, EST NON AFRICAN AMERICAN: 23 mL/min — AB (ref >60)
Glucose, Bld: 197 mg/dL — ABNORMAL HIGH (ref 65–99)
Potassium: 3.9 mmol/L (ref 3.5–5.1)
Sodium: 135 mmol/L (ref 135–145)
Total Bilirubin: 0.1 mg/dL — ABNORMAL LOW (ref 0.3–1.2)
Total Protein: 6.2 g/dL — ABNORMAL LOW (ref 6.5–8.1)

## 2016-12-05 LAB — ECHOCARDIOGRAM COMPLETE
Height: 62 in
Weight: 4074.1 [oz_av]

## 2016-12-05 LAB — EXPECTORATED SPUTUM ASSESSMENT W GRAM STAIN, RFLX TO RESP C

## 2016-12-05 LAB — URINE CULTURE

## 2016-12-05 LAB — GLUCOSE, CAPILLARY: Glucose-Capillary: 160 mg/dL — ABNORMAL HIGH (ref 65–99)

## 2016-12-05 MED ORDER — IPRATROPIUM BROMIDE 0.02 % IN SOLN
0.5000 mg | Freq: Three times a day (TID) | RESPIRATORY_TRACT | Status: DC
  Administered 2016-12-05 (×2): 0.5 mg via RESPIRATORY_TRACT
  Filled 2016-12-05 (×2): qty 2.5

## 2016-12-05 MED ORDER — LEVALBUTEROL HCL 1.25 MG/0.5ML IN NEBU
1.2500 mg | INHALATION_SOLUTION | Freq: Three times a day (TID) | RESPIRATORY_TRACT | Status: DC
  Administered 2016-12-05 (×2): 1.25 mg via RESPIRATORY_TRACT
  Filled 2016-12-05 (×2): qty 0.5

## 2016-12-05 MED ORDER — PERFLUTREN LIPID MICROSPHERE
1.0000 mL | INTRAVENOUS | Status: AC | PRN
  Administered 2016-12-05: 2 mL via INTRAVENOUS
  Filled 2016-12-05 (×2): qty 10

## 2016-12-05 MED ORDER — HEPARIN SODIUM (PORCINE) 5000 UNIT/ML IJ SOLN
5000.0000 [IU] | Freq: Three times a day (TID) | INTRAMUSCULAR | Status: DC
  Administered 2016-12-05 – 2016-12-13 (×22): 5000 [IU] via SUBCUTANEOUS
  Filled 2016-12-05 (×24): qty 1

## 2016-12-05 MED ORDER — IPRATROPIUM-ALBUTEROL 0.5-2.5 (3) MG/3ML IN SOLN: 3.0000 mL | RESPIRATORY_TRACT | Status: DC | PRN

## 2016-12-05 MED ORDER — IPRATROPIUM-ALBUTEROL 0.5-2.5 (3) MG/3ML IN SOLN: 3.0000 mL | Freq: Three times a day (TID) | RESPIRATORY_TRACT | Status: DC

## 2016-12-05 MED ORDER — IPRATROPIUM-ALBUTEROL 0.5-2.5 (3) MG/3ML IN SOLN
3.0000 mL | Freq: Four times a day (QID) | RESPIRATORY_TRACT | Status: DC
  Administered 2016-12-06 (×4): 3 mL via RESPIRATORY_TRACT
  Filled 2016-12-05 (×4): qty 3

## 2016-12-05 MED ORDER — LEVALBUTEROL HCL 1.25 MG/0.5ML IN NEBU: 1.2500 mg | INHALATION_SOLUTION | Freq: Three times a day (TID) | RESPIRATORY_TRACT | Status: DC | PRN

## 2016-12-05 NOTE — Plan of Care (Signed)
Problem: Education: Goal: Knowledge of disease or condition will improve Outcome: Progressing Discussed with patient about antibiotics and physical activity to use the bedside commode over the bedpan with some teach back displayed.

## 2016-12-05 NOTE — Progress Notes (Signed)
PROGRESS NOTE    Amanda Horton  JTT:[id number] DOB: 09-26-1946 DOA: 12/03/2016 PCP: Nicoletta Dress, MD   Brief Narrative:   71 y.o.WF PMHx Depression and Anxiety COPD on 2 L oxygen at home, OSA, Combined chronic Diastolic and Systolic CHF (last 2 D ECHO in 08/2016 with EF 45% and grade 1 DD), CKD stage 3(baseline Cr 1.2 in 08/2016), HTN, HLD,  She presented to [facility] with worsening shortness of breath at rest and with exertion over past few days prior to this admission.  She used inhaler and nebulizer at home but with no significant symptomatic relief. She reported associated cough productive of clear to whitish sputum. She also had more frequent awakenings at night time due to shortness of breath and cough. She also reported subjective fevers at home. No chest pain, no palpations. She had some nausea but no vomiting. No abdominal pain, no diarrhea or constipation. She was seen in urgent care where herr oxygen saturation was in mid 80's and for this reason she was referred to ED for further evaluation.   ED Course: In ED, BP was 128/61, T max 101.80F, HR 106-151, RR 18-30, oxygen saturation 92% with BiPAP which has improved to 100%. At this time she is on 5 L [place] and her O2 sa is 95%. Blood work was notable for WBC count 14, hemoglobin 9.1, CO@ 34, Cr 1.67 (baseline in 08/2016 was 1.2), troponin was 0.16, normal lactic acid. BNP was 165. The 12 lead EKG showed atypical LBBB. CXR showed atelectasis but infection not excluded. She was given solumedrol 125 mg IV once and continuous nebs. She continues to have shortness of breath and wheezing. In addition, sepsis criteria met on admission with source likely pneumonia so azithromycin and rocephin started in ED.    Subjective: 1/2 A/O 4, positive acute on chronic respiratory failure. Positive nonproductive cough. Positive increased SOB, negative CP. States stopped smoking 2013. States normally on 2 L O2 at home and BiPAP at night (unsure of  settings)   Assessment & Plan:   Principal Problem:   Acute on chronic respiratory failure with hypoxia (HCC) Active Problems:   GERD (gastroesophageal reflux disease)   OSA (obstructive sleep apnea)   Essential hypertension   COPD with acute exacerbation (HCC)   Sepsis due to pneumonia (HCC)   Leukocytosis   Troponin level elevated   Chronic combined systolic and diastolic CHF (congestive heart failure) (HCC)   Acute renal failure superimposed on stage 3 chronic kidney disease (HCC)   Anemia of chronic kidney failure   Metabolic alkalosis   Anxiety and depression   Acute on chronic respiratory failure with hypoxia (HCC) / COPD with acute exacerbation (HCC) / Oxygen dependent  - Acute respiratory failure with hypoxia likely due to combination of COPD and pneumonia - COPD gold / focused order set utilized - Continue nebulizer treatments: xopenex and Atrovent very 4 hours scheduled and duoneb every 4 hours as needed for shortness of breath or wheezing - Has received solumedrol 125 mg IV in ED  continue 60 mg IV Q 12 hours -DuoNeb QID -Xopenex TID PRN -Flutter valve -BiPAP QHS to maintain SPO2 89-93% -Titrate O2 during waking hours to maintain SPO2 89-93%; currently on 5 L  oxygen support. Baseline 2 L   Sepsis due to pneumonia ([employer]) /Leukocytosis - Sepsis criteria met on admission with fever, tachycardia, tachypnea, hypoxia, leukocytosis and source of infection likely pneumonia - CXR with atelectasis but infection cannot be ruled out -See COPD exacerbation   Chronic combined  systolic and diastolic CHF (congestive heart failure) ([place]) - 2 D ECHO in 08/2016 with EF 45% and grade 1 DD - strict in and out -Daily weight Filed Weights   12/03/16 2200 12/05/16 0300  Weight: 114.1 kg (251 lb 8.7 oz) 115.5 kg (254 lb 10.1 oz)  -Unknown base weight -Transfuse for hemoglobin<8  Troponin level elevated - Likely demand ischemia from hypoxia, CKD - No chest pain - The 12  lead EKG on admission showed sinus tachycardia, atypical LBBB - Last 2 D ECHO in 08/2016 showed EF 45% with grade 1 DD -Troponin trending down - Continue aspirin daily  - Echocardiogram pending  Essential hypertension - Metoprolol 25 mg BID   Acute renal failure superimposed on CKD stage 3 ( Baseline Cr In 08/2016 1.2) -Presented with a creatinine of 1.67 - Acute elevation in Cr likely due to sepsis, acute infection but also possibly lasix - Discontinue Lasix and monitor Lab Results  Component Value Date   CREATININE 2.11 (H) 12/05/2016   CREATININE 1.75 (H) 12/04/2016   CREATININE 1.67 (H) 12/03/2016    Anemia of chronic kidney failure - Hemoglobin stable  Metabolic alkalosis? - ABG in a.m.  Anxiety and depression - Continue lexapro and ativan every 8 hours PRN  Chronic venous stasis changes bilateral lower extremities - WOC consulted    DVT prophylaxis: Heparin per pharmacy Code Status: Full Family Communication: None Disposition Plan: Resolution sepsis   Consultants:  None  Procedures/Significant Events:  NA   VENTILATOR SETTINGS: NA   Cultures 12/31 HIV negative 12/31 strep pneumo urine antigen negative 1/1 influenza A negative 1/1 respiratory virus panel negative    Antimicrobials: Anti-infectives    Start     Stop   12/04/16 2000  cefTRIAXone (ROCEPHIN) 1 g in dextrose 5 % 50 mL IVPB         12/04/16 2000  azithromycin (ZITHROMAX) 500 mg in dextrose 5 % 250 mL IVPB         12/03/16 2015  cefTRIAXone (ROCEPHIN) 1 g in dextrose 5 % 50 mL IVPB  Status:  Discontinued     12/03/16 2244   12/03/16 2015  azithromycin (ZITHROMAX) 500 mg in dextrose 5 % 250 mL IVPB  Status:  Discontinued     12/03/16 2244   12/03/16 1645  cefTRIAXone (ROCEPHIN) 1 g in dextrose 5 % 50 mL IVPB     12/03/16 1756   12/03/16 1645  azithromycin (ZITHROMAX) 500 mg in dextrose 5 % 250 mL IVPB            Devices NA   LINES / TUBES:   NA    Continuous Infusions: . sodium chloride 10 mL/hr at 12/03/16 2239     Objective: Vitals:   12/05/16 0811 12/05/16 1240 12/05/16 1422 12/05/16 1634  BP:  (!) 146/72  (!) 160/89  Pulse:      Resp:    18  Temp:  97.4 F (36.3 C)  98.2 F (36.8 C)  TempSrc:  Oral  Oral  SpO2: 95%  94%   Weight:      Height:        Intake/Output Summary (Last 24 hours) at 12/05/16 1653 Last data filed at 12/05/16 0743  Gross per 24 hour  Intake             1060 ml  Output             1150 ml  Net              -  90 ml   Filed Weights   12/03/16 2200 12/05/16 0300  Weight: 114.1 kg (251 lb 8.7 oz) 115.5 kg (254 lb 10.1 oz)    Examination:  General: A/O 4, positive acute on chronic respiratory distress Eyes: negative scleral hemorrhage, negative anisocoria, negative icterus ENT: Negative Runny nose, negative gingival bleeding, Neck:  Negative scars, masses, torticollis, lymphadenopathy, JVD Lungs: views poor air movement, diffuse expiratory wheeze, negative crackles Cardiovascular: Regular rate and rhythm without murmur gallop or rub normal S1 and S2 Abdomen: MORBIDLY OBESE, negative abdominal pain, nondistended, positive soft, bowel sounds, no rebound, no ascites, no appreciable mass Extremities: positive bilateral lower extremity edema 2-3+  Skin: Bilateral lower extremity since ulcers  Psychiatric:  Negative depression, negative anxiety, negative fatigue, negative mania  Central nervous system:  Cranial nerves II through XII intact, tongue/uvula midline, all extremities muscle strength 5/5, sensation intact throughout,  negative dysarthria, negative expressive aphasia, negative receptive aphasia.  .     Data Reviewed: Care during the described time interval was provided by me .  I have reviewed this patient's available data, including medical history, events of note, physical examination, and all test results as part of my evaluation. I have personally reviewed and interpreted  all radiology studies.  CBC:  Recent Labs Lab 12/03/16 1545 12/04/16 0415 12/05/16 0302  WBC 14.0* 11.0* 11.0*  NEUTROABS 11.6*  --   --   HGB 9.1* 8.4* 8.3*  HCT 31.7* 29.1* 28.0*  MCV 84.3 83.9 83.3  PLT 272 258 272   Basic Metabolic Panel:  Recent Labs Lab 12/03/16 1545 12/04/16 0415 12/05/16 0302  NA 133* 137 135  K 3.5 3.7 3.9  CL 86* 88* 88*  CO2 34* 36* 36*  GLUCOSE 132* 154* 197*  BUN 11 14 24*  CREATININE 1.67* 1.75* 2.11*  CALCIUM 8.7* 8.6* 8.4*   GFR: Estimated Creatinine Clearance: 29.9 mL/min (by C-G formula based on SCr of 2.11 mg/dL (H)). Liver Function Tests:  Recent Labs Lab 12/03/16 1545 12/04/16 0415 12/05/16 0302  AST _0 ALT 9* 9* 9*  ALKPHOS 58 52 49  BILITOT 0.6 0.4 0.1*  PROT 6.7 6.1* 6.2*  ALBUMIN 2.8* 2.5* 2.8*   No results for input(s): LIPASE, AMYLASE in the last 168 hours. No results for input(s): AMMONIA in the last 168 hours. Coagulation Profile: No results for input(s): INR, PROTIME in the last 168 hours. Cardiac Enzymes:  Recent Labs Lab 12/03/16 1545 12/03/16 2158 12/04/16 0415 12/04/16 1150  TROPONINI 0.16* 0.09* 0.03* 0.09*   BNP (last 3 results) No results for input(s): PROBNP in the last 8760 hours. HbA1C: No results for input(s): HGBA1C in the last 72 hours. CBG:  Recent Labs Lab 12/04/16 0818 12/05/16 0740  GLUCAP 155* 160*   Lipid Profile: No results for input(s): CHOL, HDL, LDLCALC, TRIG, CHOLHDL, LDLDIRECT in the last 72 hours. Thyroid Function Tests: No results for input(s): TSH, T4TOTAL, FREET4, T3FREE, THYROIDAB in the last 72 hours. Anemia Panel: No results for input(s): VITAMINB12, FOLATE, FERRITIN, TIBC, IRON, RETICCTPCT in the last 72 hours. Urine analysis:    Component Value Date/Time   COLORURINE YELLOW 12/03/2016 1535   APPEARANCEUR HAZY (A) 12/03/2016 1535   LABSPEC 1.013 12/03/2016 1535   PHURINE 5.0 12/03/2016 1535   GLUCOSEU NEGATIVE 12/03/2016 1535   HGBUR MODERATE  (A) 12/03/2016 1535   BILIRUBINUR NEGATIVE 12/03/2016 1535   KETONESUR NEGATIVE 12/03/2016 1535   PROTEINUR 100 (A) 12/03/2016 1535   NITRITE NEGATIVE 12/03/2016 1535   LEUKOCYTESUR  NEGATIVE 12/03/2016 1535   Sepsis Labs: _0 (procalcitonin:4,lacticidven:4)  ) Recent Results (from the past 240 hour(s))  Urine culture     Status: Abnormal   Collection Time: 12/03/16  3:35 PM  Result Value Ref Range Status   Specimen Description URINE, RANDOM  Final   Special Requests NONE  Final   Culture MULTIPLE SPECIES PRESENT, SUGGEST RECOLLECTION (A)  Final   Report Status 12/05/2016 FINAL  Final  Blood Culture (routine x 2)     Status: None (Preliminary result)   Collection Time: 12/03/16  3:38 PM  Result Value Ref Range Status   Specimen Description BLOOD LEFT FOREARM  Final   Special Requests IN PEDIATRIC BOTTLE 2CC  Final   Culture NO GROWTH 2 DAYS  Final   Report Status PENDING  Incomplete  Blood Culture (routine x 2)     Status: None (Preliminary result)   Collection Time: 12/03/16  3:45 PM  Result Value Ref Range Status   Specimen Description BLOOD RIGHT ANTECUBITAL  Final   Special Requests IN PEDIATRIC BOTTLE 2CC  Final   Culture NO GROWTH 2 DAYS  Final   Report Status PENDING  Incomplete  MRSA PCR Screening     Status: None   Collection Time: 12/03/16  9:45 PM  Result Value Ref Range Status   MRSA by PCR NEGATIVE NEGATIVE Final    Comment:        The GeneXpert MRSA Assay (FDA approved for NASAL specimens only), is one component of a comprehensive MRSA colonization surveillance program. It is not intended to diagnose MRSA infection nor to guide or monitor treatment for MRSA infections.   Culture, sputum-assessment     Status: None   Collection Time: 12/03/16  9:58 PM  Result Value Ref Range Status   Specimen Description Expect. Sput  Final   Special Requests NONE  Final   Sputum evaluation THIS SPECIMEN IS ACCEPTABLE FOR SPUTUM CULTURE  Final   Report Status  12/05/2016 FINAL  Final  Culture, respiratory (NON-Expectorated)     Status: None (Preliminary result)   Collection Time: 12/03/16  9:58 PM  Result Value Ref Range Status   Specimen Description Expect. Sput  Final   Special Requests NONE Reflexed from [id number]  Final   Gram Stain   Final    FEW WBC PRESENT, PREDOMINANTLY PMN FEW SQUAMOUS EPITHELIAL CELLS PRESENT MODERATE GRAM POSITIVE COCCOBACILLUS FEW GRAM NEGATIVE COCCOBACILLI    Culture PENDING  Incomplete   Report Status PENDING  Incomplete  Respiratory Panel by PCR     Status: None   Collection Time: 12/04/16 12:21 PM  Result Value Ref Range Status   Adenovirus NOT DETECTED NOT DETECTED Final   Coronavirus 229E NOT DETECTED NOT DETECTED Final   Coronavirus HKU1 NOT DETECTED NOT DETECTED Final   Coronavirus NL63 NOT DETECTED NOT DETECTED Final   Coronavirus OC43 NOT DETECTED NOT DETECTED Final   Metapneumovirus NOT DETECTED NOT DETECTED Final   Rhinovirus / Enterovirus NOT DETECTED NOT DETECTED Final   Influenza A NOT DETECTED NOT DETECTED Final   Influenza B NOT DETECTED NOT DETECTED Final   Parainfluenza Virus 1 NOT DETECTED NOT DETECTED Final   Parainfluenza Virus 2 NOT DETECTED NOT DETECTED Final   Parainfluenza Virus 3 NOT DETECTED NOT DETECTED Final   Parainfluenza Virus 4 NOT DETECTED NOT DETECTED Final   Respiratory Syncytial Virus NOT DETECTED NOT DETECTED Final   Bordetella pertussis NOT DETECTED NOT DETECTED Final   Chlamydophila pneumoniae NOT DETECTED NOT DETECTED Final  Mycoplasma pneumoniae NOT DETECTED NOT DETECTED Final         Radiology Studies: No results found.      Scheduled Meds: . aspirin  81 mg Oral Daily  . azithromycin  500 mg Intravenous Q24H  . calcium-vitamin D  1 tablet Oral BID WC  . cefTRIAXone (ROCEPHIN)  IV  1 g Intravenous Q24H  . docusate sodium  100 mg Oral Daily  . escitalopram  20 mg Oral QHS  . famotidine  20 mg Oral Daily  . ipratropium  0.5 mg Nebulization TID  .  levalbuterol  1.25 mg Nebulization TID  . linaclotide  145 mcg Oral QAC breakfast  . mouth rinse  15 mL Mouth Rinse BID  . methylPREDNISolone (SOLU-MEDROL) injection  60 mg Intravenous Q12H  . metoprolol tartrate  25 mg Oral BID  . pantoprazole  40 mg Oral Daily  . potassium chloride SA  20 mEq Oral BID  . sodium chloride flush  3 mL Intravenous Q12H  . cyanocobalamin  500 mcg Oral Daily  . Vitamin D (Ergocalciferol)  50,000 Units Oral Q7 days   Continuous Infusions: . sodium chloride 10 mL/hr at 12/03/16 2239     LOS: 2 days    Time spent: 40 minutes    Keeshia Sanderlin, Geraldo Docker, MD [facility] Pager [phone]   If 7PM-7AM, please contact night-coverage [email or web address] Password [email or web address] 12/05/2016, 4:53 PM

## 2016-12-05 NOTE — Progress Notes (Signed)
Patient is currently on [facility] with sats of 99%. All vitals are stable and patient is in no distress. BIPAP is not needed at this time.

## 2016-12-05 NOTE — Consult Note (Signed)
[place] Nurse wound consult note Reason for Consult:Degloving injury from fall at home to right anterior lower leg.  Fall was 10 weeks ago.  [facility] had surgically debrided wound and began using Endoform collagen dressing.  The family has been applying surgilube to wound bed daily (leave the mesh drape in place).  Will continue this.  Wound type:Trauma Pressure Ulcer POA: N/A Measurement:The right lower leg wound  20 x 3.2 cm and flush with surrounding skin.  There is a small ulcer over the left anterior ankle 1.5 x 1 cm with yellow slough present. Some surrounding erythema of the left lower leg. Lower extremity edema present, generalized. Wound [id number] and moist Drainage (amount, consistency, odor) minimal serosanguinous Periwound: Dressing procedure/placement/frequency:Right anterior lower leg:    Silicone meshed dressing present over Endoform collagen dressing.  apply KY jelly to the area daily and cover with gauze, ABD pad, wrap with kerlix and tape.   Resolving nonintact lesion to left malleolus:  Cleanse with NS and pat gently dry.  Apply silicone border foam dressing. Change every 3 days and PRN soilage.  Will not follow at this time.  Please re-consult if needed.  Domenic Moras RN BSN [place] Pager [phone]

## 2016-12-05 NOTE — Progress Notes (Signed)
Nutrition Consult/Brief Note  RD consulted per COPD Gold Protocol.  Wt Readings from Last 15 Encounters:  12/05/16 254 lb 10.1 oz (115.5 kg)  09/26/16 240 lb (108.9 kg)  09/20/16 248 lb (112.5 kg)  09/01/16 248 lb (112.5 kg)  05/04/15 254 lb 12.8 oz (115.6 kg)  03/23/15 257 lb 9.6 oz (116.8 kg)  01/19/15 259 lb (117.5 kg)    Body mass index is 46.57 kg/m. Patient meets criteria for Obesity Class III based on current BMI.   Current diet order is Regular, patient is consuming approximately 80% of meals at this time. Labs and medications reviewed.   No nutrition interventions warranted at this time. If nutrition issues arise, please consult RD.   Arthur Holms, RD, LDN Pager #: [phone] After-Hours Pager #: [phone]

## 2016-12-05 NOTE — Progress Notes (Signed)
*  PRELIMINARY RESULTS* Echocardiogram 2D Echocardiogram with definity has been performed.  Leavy Cella 12/05/2016, 4:08 PM

## 2016-12-06 ENCOUNTER — Inpatient Hospital Stay (HOSPITAL_COMMUNITY): Payer: Commercial Managed Care - HMO

## 2016-12-06 DIAGNOSIS — I5042 Chronic combined systolic (congestive) and diastolic (congestive) heart failure: Secondary | ICD-10-CM

## 2016-12-06 DIAGNOSIS — I10 Essential (primary) hypertension: Secondary | ICD-10-CM

## 2016-12-06 DIAGNOSIS — R748 Abnormal levels of other serum enzymes: Secondary | ICD-10-CM

## 2016-12-06 LAB — CBC WITH DIFFERENTIAL/PLATELET
BASOS PCT: 0 %
Basophils Absolute: 0 10*3/uL (ref 0.0–0.1)
EOS ABS: 0 10*3/uL (ref 0.0–0.7)
EOS PCT: 0 %
HEMATOCRIT: 30.2 % — AB (ref 36.0–46.0)
Hemoglobin: 8.7 g/dL — ABNORMAL LOW (ref 12.0–15.0)
Lymphocytes Relative: 5 %
Lymphs Abs: 0.4 10*3/uL — ABNORMAL LOW (ref 0.7–4.0)
MCH: 24.4 pg — ABNORMAL LOW (ref 26.0–34.0)
MCHC: 28.8 g/dL — AB (ref 30.0–36.0)
MCV: 84.8 fL (ref 78.0–100.0)
MONO ABS: 0.4 10*3/uL (ref 0.1–1.0)
Monocytes Relative: 4 %
Neutro Abs: 7.4 10*3/uL (ref 1.7–7.7)
Neutrophils Relative %: 91 %
PLATELETS: 300 10*3/uL (ref 150–400)
RBC: 3.56 MIL/uL — ABNORMAL LOW (ref 3.87–5.11)
RDW: 15.3 % (ref 11.5–15.5)
WBC: 8.2 10*3/uL (ref 4.0–10.5)

## 2016-12-06 LAB — BLOOD GAS, ARTERIAL
ACID-BASE EXCESS: 13.7 mmol/L — AB (ref 0.0–2.0)
Bicarbonate: 39.8 mmol/L — ABNORMAL HIGH (ref 20.0–28.0)
DRAWN BY: 419771
FIO2: 28
O2 Content: 2 L/min
O2 SAT: 96.3 %
PATIENT TEMPERATURE: 98.6
PCO2 ART: 72.9 mmHg — AB (ref 32.0–48.0)
PO2 ART: 86 mmHg (ref 83.0–108.0)
pH, Arterial: 7.356 (ref 7.350–7.450)

## 2016-12-06 LAB — BASIC METABOLIC PANEL
Anion gap: 2 — ABNORMAL LOW (ref 5–15)
BUN: 32 mg/dL — AB (ref 6–20)
CALCIUM: 8.8 mg/dL — AB (ref 8.9–10.3)
CO2: 44 mmol/L — ABNORMAL HIGH (ref 22–32)
CREATININE: 1.91 mg/dL — AB (ref 0.44–1.00)
Chloride: 93 mmol/L — ABNORMAL LOW (ref 101–111)
GFR calc Af Amer: 30 mL/min — ABNORMAL LOW (ref >60)
GFR calc non Af Amer: 25 mL/min — ABNORMAL LOW (ref >60)
GLUCOSE: 185 mg/dL — AB (ref 65–99)
Potassium: 4.7 mmol/L (ref 3.5–5.1)
SODIUM: 139 mmol/L (ref 135–145)

## 2016-12-06 LAB — MAGNESIUM: Magnesium: 2.2 mg/dL (ref 1.7–2.4)

## 2016-12-06 LAB — GLUCOSE, CAPILLARY: GLUCOSE-CAPILLARY: 159 mg/dL — AB (ref 65–99)

## 2016-12-06 MED ORDER — METHYLPREDNISOLONE SODIUM SUCC 125 MG IJ SOLR
60.0000 mg | Freq: Every day | INTRAMUSCULAR | Status: DC
  Administered 2016-12-07: 60 mg via INTRAVENOUS
  Filled 2016-12-06: qty 2

## 2016-12-06 MED ORDER — FUROSEMIDE 10 MG/ML IJ SOLN
80.0000 mg | Freq: Two times a day (BID) | INTRAMUSCULAR | Status: AC
  Administered 2016-12-06 – 2016-12-07 (×2): 80 mg via INTRAVENOUS
  Filled 2016-12-06 (×2): qty 8

## 2016-12-06 NOTE — Progress Notes (Signed)
Occupational Therapy Treatment Patient Details Name: Amanda Horton MRN: [id number] DOB: [birth year] Today's Date: 12/06/2016    History of present illness Amanda Horton is a 71 y.o. female with medical history significant for COPD on 2 L oxygen at home, OSA, CHF , CKD stage 3, HTN, depression and anxiety. She presented to [facility] with worsening shortness of breath at rest and with exertion over past few days prior to this admission   OT comments  Focus of session on seated grooming, standing tolerance and education in pursed lip breathing during exertion. Pt with irregular HR throughout session, vitals monitored closely. Pt's sats remained in mid 90s on 4L 02.   Follow Up Recommendations  Home health OT;Supervision/Assistance - 24 hour    Equipment Recommendations  None recommended by OT    Recommendations for Other Services      Precautions / Restrictions Precautions Precautions: Fall Precaution Comments: monitor sats and HR Restrictions Weight Bearing Restrictions: No       Mobility Bed Mobility               General bed mobility comments: received in chair  Transfers Overall transfer level: Needs assistance Equipment used: 2 person hand held assist Transfers: Sit to/from Stand Sit to Stand: Min guard         General transfer comment: No physical assist required, min guard for safety    Balance Overall balance assessment: Needs assistance Sitting-balance support: No upper extremity supported;Feet supported Sitting balance-Leahy Scale: Good     Standing balance support: Bilateral upper extremity supported Standing balance-Leahy Scale: Good                     ADL Overall ADL's : Needs assistance/impaired     Grooming: Wash/dry hands;Wash/dry face;Oral care;Brushing hair;Sitting;Set up                               Functional mobility during ADLs: Minimal assistance (hand held, marched in place) General ADL Comments: Encouraged pursed  lip breathing.      Vision                     Perception     Praxis      Cognition   Behavior During Therapy: WFL for tasks assessed/performed Overall Cognitive Status: Within Functional Limits for tasks assessed                       Extremity/Trunk Assessment               Exercises    Shoulder Instructions       General Comments      Pertinent Vitals/ Pain       Pain Assessment: No/denies pain  Home Living                                          Prior Functioning/Environment              Frequency  Min 2X/week        Progress Toward Goals  OT Goals(current goals can now be found in the care plan section)  Progress towards OT goals: Progressing toward goals  Acute Rehab OT Goals Patient Stated Goal: to go home Time For Goal Achievement: 12/18/16 Potential to Achieve Goals: Good  Plan Discharge  plan remains appropriate    Co-evaluation                 End of Session Equipment Utilized During Treatment: Oxygen (4L)   Activity Tolerance Treatment limited secondary to medical complications (Comment) (pt with irregular HR, limited activity)   Patient Left in chair;with call bell/phone within reach   Nurse Communication  (pt's activity tolerance and HR)        Time: 1020-1041 OT Time Calculation (min): 21 min  Charges: OT General Charges $OT Visit: 1 Procedure OT Treatments $Self Care/Home Management : 8-22 mins  Malka So 12/06/2016, 10:51 AM  [phone]

## 2016-12-06 NOTE — Progress Notes (Signed)
PROGRESS NOTE    Amanda Horton  OZH:[id number] DOB: 09/22/1946 DOA: 12/03/2016 PCP: Nicoletta Dress, MD   Brief Narrative:   71 y.o.WF PMHx Depression and Anxiety COPD on 2 L oxygen at home, OSA, Combined chronic Diastolic and Systolic CHF (last 2 D ECHO in 08/2016 with EF 45% and grade 1 DD), CKD stage 3(baseline Cr 1.2 in 08/2016), HTN, HLD,  She presented to [facility] with worsening shortness of breath at rest and with exertion over past few days prior to this admission.  She used inhaler and nebulizer at home but with no significant symptomatic relief. She reported associated cough productive of clear to whitish sputum. She also had more frequent awakenings at night time due to shortness of breath and cough. She also reported subjective fevers at home. No chest pain, no palpations. She had some nausea but no vomiting. No abdominal pain, no diarrhea or constipation. She was seen in urgent care where herr oxygen saturation was in mid 80's and for this reason she was referred to ED for further evaluation.   ED Course: In ED, BP was 128/61, T max 101.9F, HR 106-151, RR 18-30, oxygen saturation 92% with BiPAP which has improved to 100%. At this time she is on 5 L [place] and her O2 sa is 95%. Blood work was notable for WBC count 14, hemoglobin 9.1, CO@ 34, Cr 1.67 (baseline in 08/2016 was 1.2), troponin was 0.16, normal lactic acid. BNP was 165. The 12 lead EKG showed atypical LBBB. CXR showed atelectasis but infection not excluded. She was given solumedrol 125 mg IV once and continuous nebs. She continues to have shortness of breath and wheezing. In addition, sepsis criteria met on admission with source likely pneumonia so azithromycin and rocephin started in ED.    Subjective: 1/2 A/O 4, positive acute on chronic respiratory failure. Positive nonproductive cough. Positive increased SOB, negative CP. States stopped smoking 2013. States normally on 2 L O2 at home and BiPAP at night (unsure of  settings)   Assessment & Plan:   Principal Problem:   Acute on chronic respiratory failure with hypoxia (HCC) Active Problems:   GERD (gastroesophageal reflux disease)   OSA (obstructive sleep apnea)   Essential hypertension   COPD with acute exacerbation (HCC)   Sepsis due to pneumonia (HCC)   Leukocytosis   Troponin level elevated   Chronic combined systolic and diastolic CHF (congestive heart failure) (HCC)   Acute renal failure superimposed on stage 3 chronic kidney disease (HCC)   Anemia of chronic kidney failure   Metabolic alkalosis   Anxiety and depression   Elevated troponin   Anemia of chronic kidney failure, stage 4 (severe) (HCC)   Venous stasis ulcer limited to breakdown of skin with varicose veins (HCC)   Acute on chronic respiratory failure with hypoxia ([place]) / due to acute on chronic diastolic CHF EF 62%.  Still short of breath and has rales, place on IV Lasix, echo noted with diastolic CHF and preserved EF of 55%, laced on salt and fluid restriction, monitor weight intake and output. Continue supportive care with oxygen and nebulizer treatments as needed. Continue flutter valve.   COPD doubt any exacerbation or pneumonia. Currently no sepsis.  CXR has no infiltrate, she has no wheezing, taper down steroids, continue supportive care with nebulizer treatments and oxygen treatment. On azithromycin stopping couple of days if still afebrile. Leukocytosis is resolved despite steroids.    Acute on chronic diastolic CHF with EF 95%. Plan is #1 above.  Troponin level elevated Likely demand ischemia from respiratory failure, troponin trend flat, non-ACS pattern, no chest pain, on beta blocker and aspirin continued.  Essential hypertension - Metoprolol 25 mg BID   Acute renal failure superimposed on CKD stage 3 ( Baseline Cr In 08/2016 1.2) -Presented with a creatinine of 1.67 - Acute elevation in Cr likely due to sepsis, acute infection but also possibly  lasix - Discontinue Lasix and monitor Lab Results  Component Value Date   CREATININE 1.91 (H) 12/06/2016   CREATININE 2.11 (H) 12/05/2016   CREATININE 1.75 (H) 12/04/2016    Anemia of chronic kidney failure - Hemoglobin stable  Metabolic alkalosis? - ABG in a.m.  Anxiety and depression - Continue lexapro and ativan every 8 hours PRN  Chronic venous stasis changes bilateral lower extremities - WOC consulted    DVT prophylaxis: Heparin per pharmacy Code Status: Full Family Communication: None Disposition Plan: Resolution sepsis   Consultants:  None  Procedures/Significant Events:  TTE  Technically difficult study with poor acoustic windows. Normal LV   size with moderate LV hypertrophy. EF 55%. Septal-lateral   dyssynchrony with septal hypokinesis. Normal RV size and systolic   function. No significant valvular abnormalities.   VENTILATOR SETTINGS: NA   Cultures 12/31 HIV negative 12/31 strep pneumo urine antigen negative 1/1 influenza A negative 1/1 respiratory virus panel negative    Antimicrobials: Anti-infectives    Start     Stop   12/04/16 2000  cefTRIAXone (ROCEPHIN) 1 g in dextrose 5 % 50 mL IVPB         12/04/16 2000  azithromycin (ZITHROMAX) 500 mg in dextrose 5 % 250 mL IVPB         12/03/16 2015  cefTRIAXone (ROCEPHIN) 1 g in dextrose 5 % 50 mL IVPB  Status:  Discontinued     12/03/16 2244   12/03/16 2015  azithromycin (ZITHROMAX) 500 mg in dextrose 5 % 250 mL IVPB  Status:  Discontinued     12/03/16 2244   12/03/16 1645  cefTRIAXone (ROCEPHIN) 1 g in dextrose 5 % 50 mL IVPB     12/03/16 1756   12/03/16 1645  azithromycin (ZITHROMAX) 500 mg in dextrose 5 % 250 mL IVPB            Devices NA   LINES / TUBES:  NA    Continuous Infusions: . sodium chloride 10 mL/hr at 12/03/16 2239     Objective: Vitals:   12/06/16 0445 12/06/16 1100 12/06/16 1200 12/06/16 1211  BP:  (!) 145/84 (!) 149/86   Pulse:  63 63    Resp:  19 15   Temp:   98.2 F (36.8 C)   TempSrc:  Oral Oral Oral  SpO2:  98% 97%   Weight: 115.1 kg (253 lb 12 oz)     Height:        Intake/Output Summary (Last 24 hours) at 12/06/16 1353 Last data filed at 12/05/16 2133  Gross per 24 hour  Intake              300 ml  Output                0 ml  Net              300 ml   Filed Weights   12/03/16 2200 12/05/16 0300 12/06/16 0445  Weight: 114.1 kg (251 lb 8.7 oz) 115.5 kg (254 lb 10.1 oz) 115.1 kg (253 lb 12 oz)    Examination:  General: A/O 4, positive acute on chronic respiratory distress Eyes: negative scleral hemorrhage, negative anisocoria, negative icterus ENT: Negative Runny nose, negative gingival bleeding, Neck:  Negative scars, masses, torticollis, lymphadenopathy, JVD Lungs: views poor air movement, no wheeze, +ve crackles Cardiovascular: Regular rate and rhythm without murmur gallop or rub normal S1 and S2 Abdomen: MORBIDLY OBESE, negative abdominal pain, nondistended, positive soft, bowel sounds, no rebound, no ascites, no appreciable mass Extremities: positive bilateral lower extremity edema 2-3+  Skin: Bilateral lower extremity since ulcers  Psychiatric:  Negative depression, negative anxiety, negative fatigue, negative mania  Central nervous system:  Cranial nerves II through XII intact, tongue/uvula midline, all extremities muscle strength 5/5, sensation intact throughout,  negative dysarthria, negative expressive aphasia, negative receptive aphasia.   Data Reviewed: Care during the described time interval was provided by me .  I have reviewed this patient's available data, including medical history, events of note, physical examination, and all test results as part of my evaluation. I have personally reviewed and interpreted all radiology studies.  CBC:  Recent Labs Lab 12/03/16 1545 12/04/16 0415 12/05/16 0302 12/06/16 0300  WBC 14.0* 11.0* 11.0* 8.2  NEUTROABS 11.6*  --   --  7.4  HGB 9.1* 8.4*  8.3* 8.7*  HCT 31.7* 29.1* 28.0* 30.2*  MCV 84.3 83.9 83.3 84.8  PLT 272 258 274 683   Basic Metabolic Panel:  Recent Labs Lab 12/03/16 1545 12/04/16 0415 12/05/16 0302 12/06/16 0300  NA 133* 137 135 139  K 3.5 3.7 3.9 4.7  CL 86* 88* 88* 93*  CO2 34* 36* 36* 44*  GLUCOSE 132* 154* 197* 185*  BUN 11 14 24* 32*  CREATININE 1.67* 1.75* 2.11* 1.91*  CALCIUM 8.7* 8.6* 8.4* 8.8*  MG  --   --   --  2.2   GFR: Estimated Creatinine Clearance: 32.9 mL/min (by C-G formula based on SCr of 1.91 mg/dL (H)). Liver Function Tests:  Recent Labs Lab 12/03/16 1545 12/04/16 0415 12/05/16 0302  AST '19 16 15  '$ ALT 9* 9* 9*  ALKPHOS 58 52 49  BILITOT 0.6 0.4 0.1*  PROT 6.7 6.1* 6.2*  ALBUMIN 2.8* 2.5* 2.8*   No results for input(s): LIPASE, AMYLASE in the last 168 hours. No results for input(s): AMMONIA in the last 168 hours. Coagulation Profile: No results for input(s): INR, PROTIME in the last 168 hours. Cardiac Enzymes:  Recent Labs Lab 12/03/16 1545 12/03/16 2158 12/04/16 0415 12/04/16 1150  TROPONINI 0.16* 0.09* 0.03* 0.09*   BNP (last 3 results) No results for input(s): PROBNP in the last 8760 hours. HbA1C: No results for input(s): HGBA1C in the last 72 hours. CBG:  Recent Labs Lab 12/04/16 0818 12/05/16 0740 12/06/16 0849  GLUCAP 155* 160* 159*   Lipid Profile: No results for input(s): CHOL, HDL, LDLCALC, TRIG, CHOLHDL, LDLDIRECT in the last 72 hours. Thyroid Function Tests: No results for input(s): TSH, T4TOTAL, FREET4, T3FREE, THYROIDAB in the last 72 hours. Anemia Panel: No results for input(s): VITAMINB12, FOLATE, FERRITIN, TIBC, IRON, RETICCTPCT in the last 72 hours. Urine analysis:    Component Value Date/Time   COLORURINE YELLOW 12/03/2016 1535   APPEARANCEUR HAZY (A) 12/03/2016 1535   LABSPEC 1.013 12/03/2016 1535   PHURINE 5.0 12/03/2016 1535   GLUCOSEU NEGATIVE 12/03/2016 1535   HGBUR MODERATE (A) 12/03/2016 1535   BILIRUBINUR NEGATIVE  12/03/2016 1535   KETONESUR NEGATIVE 12/03/2016 1535   PROTEINUR 100 (A) 12/03/2016 1535   NITRITE NEGATIVE 12/03/2016 1535   LEUKOCYTESUR NEGATIVE 12/03/2016 1535  Sepsis Labs: '@LABRCNTIP'$ (procalcitonin:4,lacticidven:4)  ) Recent Results (from the past 240 hour(s))  Urine culture     Status: Abnormal   Collection Time: 12/03/16  3:35 PM  Result Value Ref Range Status   Specimen Description URINE, RANDOM  Final   Special Requests NONE  Final   Culture MULTIPLE SPECIES PRESENT, SUGGEST RECOLLECTION (A)  Final   Report Status 12/05/2016 FINAL  Final  Blood Culture (routine x 2)     Status: None (Preliminary result)   Collection Time: 12/03/16  3:38 PM  Result Value Ref Range Status   Specimen Description BLOOD LEFT FOREARM  Final   Special Requests IN PEDIATRIC BOTTLE 2CC  Final   Culture NO GROWTH 2 DAYS  Final   Report Status PENDING  Incomplete  Blood Culture (routine x 2)     Status: None (Preliminary result)   Collection Time: 12/03/16  3:45 PM  Result Value Ref Range Status   Specimen Description BLOOD RIGHT ANTECUBITAL  Final   Special Requests IN PEDIATRIC BOTTLE 2CC  Final   Culture NO GROWTH 2 DAYS  Final   Report Status PENDING  Incomplete  MRSA PCR Screening     Status: None   Collection Time: 12/03/16  9:45 PM  Result Value Ref Range Status   MRSA by PCR NEGATIVE NEGATIVE Final    Comment:        The GeneXpert MRSA Assay (FDA approved for NASAL specimens only), is one component of a comprehensive MRSA colonization surveillance program. It is not intended to diagnose MRSA infection nor to guide or monitor treatment for MRSA infections.   Culture, sputum-assessment     Status: None   Collection Time: 12/03/16  9:58 PM  Result Value Ref Range Status   Specimen Description Expect. Sput  Final   Special Requests NONE  Final   Sputum evaluation THIS SPECIMEN IS ACCEPTABLE FOR SPUTUM CULTURE  Final   Report Status 12/05/2016 FINAL  Final  Culture, respiratory  (NON-Expectorated)     Status: None (Preliminary result)   Collection Time: 12/03/16  9:58 PM  Result Value Ref Range Status   Specimen Description Expect. Sput  Final   Special Requests NONE Reflexed from [id number]  Final   Gram Stain   Final    FEW WBC PRESENT, PREDOMINANTLY PMN FEW SQUAMOUS EPITHELIAL CELLS PRESENT MODERATE GRAM POSITIVE COCCOBACILLUS FEW GRAM NEGATIVE COCCOBACILLI    Culture CULTURE REINCUBATED FOR BETTER GROWTH  Final   Report Status PENDING  Incomplete  Respiratory Panel by PCR     Status: None   Collection Time: 12/04/16 12:21 PM  Result Value Ref Range Status   Adenovirus NOT DETECTED NOT DETECTED Final   Coronavirus 229E NOT DETECTED NOT DETECTED Final   Coronavirus HKU1 NOT DETECTED NOT DETECTED Final   Coronavirus NL63 NOT DETECTED NOT DETECTED Final   Coronavirus OC43 NOT DETECTED NOT DETECTED Final   Metapneumovirus NOT DETECTED NOT DETECTED Final   Rhinovirus / Enterovirus NOT DETECTED NOT DETECTED Final   Influenza A NOT DETECTED NOT DETECTED Final   Influenza B NOT DETECTED NOT DETECTED Final   Parainfluenza Virus 1 NOT DETECTED NOT DETECTED Final   Parainfluenza Virus 2 NOT DETECTED NOT DETECTED Final   Parainfluenza Virus 3 NOT DETECTED NOT DETECTED Final   Parainfluenza Virus 4 NOT DETECTED NOT DETECTED Final   Respiratory Syncytial Virus NOT DETECTED NOT DETECTED Final   Bordetella pertussis NOT DETECTED NOT DETECTED Final   Chlamydophila pneumoniae NOT DETECTED NOT DETECTED Final  Mycoplasma pneumoniae NOT DETECTED NOT DETECTED Final         Radiology Studies: Dg Chest Port 1 View  Result Date: 12/06/2016 CLINICAL DATA:  Shortness of breath and coughing. Symptoms are improving. EXAM: PORTABLE CHEST 1 VIEW COMPARISON:  12/03/2016 FINDINGS: Heart size is at the upper limits of normal. Mediastinal shadows are normal. Minimal basilar atelectasis persists, improving. No worsening or new findings. No effusions. No acute bone finding. IMPRESSION:  Improving basilar atelectasis . Electronically Signed   By: Nelson Chimes M.D.   On: 12/06/2016 08:09        Scheduled Meds: . aspirin  81 mg Oral Daily  . azithromycin  500 mg Intravenous Q24H  . calcium-vitamin D  1 tablet Oral BID WC  . cefTRIAXone (ROCEPHIN)  IV  1 g Intravenous Q24H  . docusate sodium  100 mg Oral Daily  . escitalopram  20 mg Oral QHS  . famotidine  20 mg Oral Daily  . furosemide  80 mg Intravenous BID  . heparin subcutaneous  5,000 Units Subcutaneous Q8H  . ipratropium-albuterol  3 mL Nebulization Q6H  . linaclotide  145 mcg Oral QAC breakfast  . mouth rinse  15 mL Mouth Rinse BID  . [START ON 12/07/2016] methylPREDNISolone (SOLU-MEDROL) injection  60 mg Intravenous Daily  . metoprolol tartrate  25 mg Oral BID  . pantoprazole  40 mg Oral Daily  . potassium chloride SA  20 mEq Oral BID  . sodium chloride flush  3 mL Intravenous Q12H  . cyanocobalamin  500 mcg Oral Daily  . Vitamin D (Ergocalciferol)  50,000 Units Oral Q7 days   Continuous Infusions: . sodium chloride 10 mL/hr at 12/03/16 2239     LOS: 3 days    Time spent: 40 minutes    Thurnell Lose, MD [facility] Pager [phone]  If 7PM-7AM, please contact night-coverage [email or web address] Password [email or web address] 12/06/2016, 1:53 PM

## 2016-12-06 NOTE — Progress Notes (Signed)
Physical Therapy Treatment Patient Details Name: Amanda Horton MRN: [id number] DOB: May 17, 1946 Today's Date: 12/06/2016    History of Present Illness Amanda Horton is a 71 y.o. female with medical history significant for COPD on 2 L oxygen at home, OSA, CHF , CKD stage 3, HTN, depression and anxiety. She presented to [facility] with worsening shortness of breath at rest and with exertion over past few days prior to this admission    PT Comments    Patient seen for activity progression. Patient tolerated pre gait activities in standing and some exercise in chair. Deferred ambulation at this time due to elevation in HR and abnormal rhythms throughout. Nsg notified. Will continue to see and progress as tolerated.  OF NOTE: activity performed on 4 liters with saturations stable >93%  HR elevated to 180s monitor reporting Vtach and bigeminy  Follow Up Recommendations  Home health PT;Supervision for mobility/OOB     Equipment Recommendations  None recommended by PT    Recommendations for Other Services       Precautions / Restrictions Precautions Precautions: Fall Restrictions Weight Bearing Restrictions: No    Mobility  Bed Mobility               General bed mobility comments: received in chair  Transfers Overall transfer level: Needs assistance Equipment used: None Transfers: Sit to/from Stand Sit to Stand: Min guard         General transfer comment: No physical assist required, min guard for safety  Ambulation/Gait             General Gait Details: deferred at this time due to irregular heart rate and rhythm   Stairs            Wheelchair Mobility    Modified Rankin (Stroke Patients Only)       Balance Overall balance assessment: Needs assistance Sitting-balance support: No upper extremity supported;Feet supported Sitting balance-Leahy Scale: Good     Standing balance support: Bilateral upper extremity supported Standing balance-Leahy Scale:  Good                      Cognition Arousal/Alertness: Awake/alert Behavior During Therapy: WFL for tasks assessed/performed Overall Cognitive Status: Within Functional Limits for tasks assessed                      Exercises Other Exercises Other Exercises: pre gait static standing Other Exercises: pre gait marching in standing Other Exercises: pre gait weight shifts  Other Exercises: long arc quads in sitting x10 bilaterally Other Exercises: ankle pumps bilaterally continuous 30 seconds    General Comments        Pertinent Vitals/Pain Pain Assessment: No/denies pain    Home Living                      Prior Function            PT Goals (current goals can now be found in the care plan section) Acute Rehab PT Goals Patient Stated Goal: to go home PT Goal Formulation: With patient Time For Goal Achievement: 12/11/16 Potential to Achieve Goals: Good Progress towards PT goals: Progressing toward goals    Frequency    Min 3X/week      PT Plan Current plan remains appropriate    Co-evaluation PT/OT/SLP Co-Evaluation/Treatment: Yes (dove tailed with OT)           End of Session Equipment Utilized During Treatment: Gait belt;Oxygen (4  liters) Activity Tolerance: Patient limited by fatigue;Treatment limited secondary to medical complications (Comment) (multiple episodes of sustained Vtach up to 180s) Patient left: in chair;with call bell/phone within reach     Time: 1022-1036 PT Time Calculation (min) (ACUTE ONLY): 14 min  Charges:  $Therapeutic Activity: 8-22 mins                    G Codes:      Duncan Dull Dec 29, 2016, 10:46 AM Alben Deeds, PT DPT  [phone]

## 2016-12-06 NOTE — Consult Note (Signed)
Cardiology Consult    Patient ID: Amanda Horton MRN: [id number], DOB/AGE: 06/09/1946   Admit date: 12/03/2016 Date of Consult: 12/06/2016  Primary Physician: Nicoletta Dress, MD Primary Cardiologist: New Requesting Provider: Dr. Candiss Norse Reason for Consultation: CHF, AF?  Patient Profile    71 yo female with PMH of Depression, Anxiety COPD, OSA, Combined chronic Diastolic and Systolic CHF , CKD stage 3, HTN, HLD who presented with dyspnea over the past couple of days.   Past Medical History   Past Medical History:  Diagnosis Date  . Acute hyperkalemia   . Acute hypokalemia   . Acute on chronic renal failure ([place])   . Allergic rhinitis   . Anemia due to chronic kidney disease   . APC (atrial premature contractions)   . Arthritis of lumbar spine ([place])   . Benign essential hypertension   . Borderline diabetes   . Chronic insomnia   . Chronic venous insufficiency   . COPD (chronic obstructive pulmonary disease) ([place])   . GERD (gastroesophageal reflux disease)   . Hyperlipemia   . Hypoxia   . Lumbar disc narrowing   . On home oxygen therapy   . OSA (obstructive sleep apnea)   . Osteoporosis   . Respiratory failure requiring intubation ([place])   . Spinal stenosis of lumbar region without neurogenic claudication   . Varicose veins with pain   . Vitamin B12 deficiency   . Vitamin D deficiency     Past Surgical History:  Procedure Laterality Date  . APPLICATION OF A-CELL OF EXTREMITY Right 08/30/2016   Procedure: APPLICATION OF A-CELL OF EXTREMITY;  Surgeon: Loel Lofty Dillingham, DO;  Location: [facility];  Service: Plastics;  Laterality: Right;  . APPLICATION OF A-CELL OF EXTREMITY Right 09/20/2016   Procedure: APPLICATION OF A-CELL OF right lower leg wound;  Surgeon: Loel Lofty Dillingham, DO;  Location: [facility];  Service: Plastics;  Laterality: Right;  . APPLICATION OF WOUND VAC Right 08/30/2016   Procedure: APPLICATION OF WOUND VAC;  Surgeon: Loel Lofty Dillingham, DO;  Location: [facility];  Service: Plastics;  Laterality: Right;  . APPLICATION OF WOUND VAC Right 09/20/2016   Procedure: APPLICATION OF WOUND VAC right lower leg wound;  Surgeon: Loel Lofty Dillingham, DO;  Location: [facility];  Service: Plastics;  Laterality: Right;  . I&D EXTREMITY Right 08/30/2016   Procedure: IRRIGATION AND DEBRIDEMENT EXTREMITY RIGHT;  Surgeon: Loel Lofty Dillingham, DO;  Location: [facility];  Service: Plastics;  Laterality: Right;  . INCISION AND DRAINAGE OF WOUND Right 09/20/2016   Procedure: IRRIGATION AND DEBRIDEMENT right lower leg wound;  Surgeon: Loel Lofty Dillingham, DO;  Location: [facility];  Service: Plastics;  Laterality: Right;  . PARTIAL HYSTERECTOMY       Allergies  Allergies  Allergen Reactions  . Sulfa Antibiotics Nausea And Vomiting and Other (See Comments)    "Stayed sick"    History of Present Illness    Amanda Horton is a 71 yo female with PMH of Depression, Anxiety COPD, OSA, Combined chronic Diastolic and Systolic CHF , CKD stage 3, HTN, HLD. Reports she saw a cardiologist in the past and had a stress test in 2007 that was normal. Has not seen one since that time. States she is current followed by her PCP in [place]. Was dx with COPD back in 2007 and was followed by pulmonology for a period of time, but then her MD stopped practicing there.   She currently lives alone, and cares mostly for herself. Wears home O2  at 2L all the time. Over the past couple of days she noticed she began to be hoarse, and had a nonproductive cough. Normally sleeps in a recliner at night for the past 4 years. She monitors her O2 levels at home, and noticed he sats were only in the low 80s the day of admission. Also had a fever. EMS was called and she required Bipap for a period of time on admission, but has since been transitioned of onto [place].    Labs showed BNP 165, Trop 0.16>>0.03>>0.09, electrolytes were stable, Hgb 9.1>>8.7, Cr 1.6. CXR with bibasilar atelectasis that has improved since admission. She has  been given 2 doses of IV lasix 80mg  with about 1.5L of UOP. Cr has increased with this to 1.9. She denies any chest pain prior to admission. No family hx of CAD. EKG on admission showed ST with LBBB. Reports she does have edema in her legs, but no more than her usual. Thinks that her dry weight at home is around 250lbs but does not weigh on a regular basis.   Inpatient Medications    . aspirin  81 mg Oral Daily  . azithromycin  500 mg Intravenous Q24H  . calcium-vitamin D  1 tablet Oral BID WC  . cefTRIAXone (ROCEPHIN)  IV  1 g Intravenous Q24H  . docusate sodium  100 mg Oral Daily  . escitalopram  20 mg Oral QHS  . famotidine  20 mg Oral Daily  . furosemide  80 mg Intravenous BID  . heparin subcutaneous  5,000 Units Subcutaneous Q8H  . ipratropium-albuterol  3 mL Nebulization Q6H  . linaclotide  145 mcg Oral QAC breakfast  . mouth rinse  15 mL Mouth Rinse BID  . [START ON 12/07/2016] methylPREDNISolone (SOLU-MEDROL) injection  60 mg Intravenous Daily  . metoprolol tartrate  25 mg Oral BID  . pantoprazole  40 mg Oral Daily  . potassium chloride SA  20 mEq Oral BID  . sodium chloride flush  3 mL Intravenous Q12H  . cyanocobalamin  500 mcg Oral Daily  . Vitamin D (Ergocalciferol)  50,000 Units Oral Q7 days    Family History    Family History  Problem Relation Age of Onset  . Emphysema Father   . Asthma Father   . Throat cancer Mother     Social History    Social History   Social History  . Marital status: Single    Spouse name: N/A  . Number of children: N/A  . Years of education: N/A   Occupational History  . Retired     [employer]   Social History Main Topics  . Smoking status: Former Smoker    Packs/day: 1.00    Years: 55.00    Types: Cigarettes    Quit date: 12/04/2012  . Smokeless tobacco: Never Used     Comment: Stopped in 2014  . Alcohol use No  . Drug use: No  . Sexual activity: Not on file   Other Topics Concern  . Not on file   Social History  Narrative  . No narrative on file     Review of Systems    General:  No chills, fever, night sweats or weight changes.  Cardiovascular:  No chest pain, dyspnea on exertion, edema, orthopnea, palpitations, paroxysmal nocturnal dyspnea. Dermatological: No rash, lesions/masses Respiratory: See HPI Urologic: No hematuria, dysuria Abdominal:   No nausea, vomiting, diarrhea, bright red blood per rectum, melena, or hematemesis Neurologic:  No visual changes, wkns, changes in  mental status. All other systems reviewed and are otherwise negative except as noted above.  Physical Exam    Blood pressure (!) 149/86, pulse 63, temperature 98.2 F (36.8 C), temperature source Oral, resp. rate 15, height 5\' 2"  (1.575 m), weight 253 lb 12 oz (115.1 kg), SpO2 97 %.  General: Pleasant ill appearing older female, Wearing [place] Psych: Normal affect. Neuro: Alert and oriented X 3. Moves all extremities spontaneously. HEENT: Normal  Neck: Supple without bruits, difficult to assess for JVD given girth. Lungs:  Resp regular and unlabored, Crackles and expiratory wheezing bilaterally. Heart: RRR no s3, s4, or murmurs. Abdomen: Soft, non-tender, non-distended, BS + x 4.  Extremities: No clubbing, cyanosis, 1+ bilateral LE edema. Wound to right shin with bandage intact. DP/PT/Radials 2+ and equal bilaterally.  Labs    Troponin (Point of Care Test) No results for input(s): TROPIPOC in the last 72 hours.  Recent Labs  12/03/16 1545 12/03/16 2158 12/04/16 0415 12/04/16 1150  TROPONINI 0.16* 0.09* 0.03* 0.09*   Lab Results  Component Value Date   WBC 8.2 12/06/2016   HGB 8.7 (L) 12/06/2016   HCT 30.2 (L) 12/06/2016   MCV 84.8 12/06/2016   PLT 300 12/06/2016    Recent Labs Lab 12/05/16 0302 12/06/16 0300  NA 135 139  K 3.9 4.7  CL 88* 93*  CO2 36* 44*  BUN 24* 32*  CREATININE 2.11* 1.91*  CALCIUM 8.4* 8.8*  PROT 6.2*  --   BILITOT 0.1*  --   ALKPHOS 49  --   ALT 9*  --   AST 15  --     GLUCOSE 197* 185*   No results found for: CHOL, HDL, LDLCALC, TRIG No results found for: [facility]   Radiology Studies    Dg Chest Port 1 View  Result Date: 12/06/2016 CLINICAL DATA:  Shortness of breath and coughing. Symptoms are improving. EXAM: PORTABLE CHEST 1 VIEW COMPARISON:  12/03/2016 FINDINGS: Heart size is at the upper limits of normal. Mediastinal shadows are normal. Minimal basilar atelectasis persists, improving. No worsening or new findings. No effusions. No acute bone finding. IMPRESSION: Improving basilar atelectasis . Electronically Signed   By: Nelson Chimes M.D.   On: 12/06/2016 08:09   Dg Chest Port 1 View  Result Date: 12/03/2016 CLINICAL DATA:  Shortness of breath. EXAM: PORTABLE CHEST 1 VIEW COMPARISON:  Chest radiograph 09/26/2016. FINDINGS: Monitoring leads overlie the patient. Stable cardiomegaly. Heterogeneous opacities within the lung bases bilaterally. No pleural effusion or pneumothorax. IMPRESSION: Cardiomegaly. Bibasilar heterogeneous opacities favored to represent atelectasis. Infection not excluded. Electronically Signed   By: Lovey Newcomer M.D.   On: 12/03/2016 16:24    ECG & Cardiac Imaging    EKG: ST with LBBB  Echo: 12/05/16  Study Conclusions  - Left ventricle: The cavity size was normal. Wall thickness was   increased in a pattern of moderate LVH. The estimated ejection   fraction was 55%. Septal-lateral dyssynchrony. The septum appears   hypokinetic. Doppler parameters are consistent with abnormal left   ventricular relaxation (grade 1 diastolic dysfunction). - Aortic valve: There was no stenosis. - Mitral valve: There was no significant regurgitation. - Right ventricle: The cavity size was normal. Systolic function   was normal. - Pulmonary arteries: No complete TR doppler jet so unable to   estimate PA systolic pressure. - Systemic veins: IVC measured 2.6 cm with < 50% respirophasic   variation, suggesting RA pressure 15  mmHg.  Impressions:  - Technically difficult study with  poor acoustic windows. Normal LV   size with moderate LV hypertrophy. EF 55%. Septal-lateral   dyssynchrony with septal hypokinesis. Normal RV size and systolic   function. No significant valvular abnormalities.  Assessment & Plan    71 yo female with PMH of Depression, Anxiety COPD, OSA, Combined chronic Diastolic and Systolic CHF , CKD stage 3, HTN, HLD who presented with dyspnea over the past couple of days.  1. Dyspnea/Acute on chronic combined HF?: Presented with multiple days of worsening dyspnea. Developed a fever prior to admission with nonproductive cough. CXR on admission showed bilateral atelectasis that has improved since admission. She was given IV lasix with 1.5L UOP thus far. Echo showed slightly improved EF to 55% from 45% back in 9/17. Was given IV antibiotics on admission for sepsis and elevated WBC with some improvement. Reports her breathing is somewhat better but not at her baseline.  -- Does have crackles on exam, likely needs further diuresis with IV lasix but will need to monitor renal function as this has elevated since admission.  -- Questions whether her underlying COPD has worsened as she has not had follow up with pulmonary for quite some time.   2. Arrhythmia: Noted to have abnormal beats on telemetry. There was question about possible AF, but there are p waves noted. Does have hx of OSA. Currently in a SR at the time of assessment. Lots of artifact noted as well. Will have MD review telemetry.   3. HTN: Borderline controlled. May need to further titrate medications depending on trend.   4. COPD: Reports she was dx in 2007 and was followed by pulmonary for a time, but has not been seen for awhile. Normally wears 2L, but has noticed that she has been becoming more dyspnic at home, and does increase her O2 at times. Question whether this has worsened.   5. Anemia: Hgb 11.1 back in 9/17, now 8.7.Denies any  active bleeding, could be contributing to dyspnea.   Barnet Pall, NP-C Pager [phone] 12/06/2016, 2:40 PM

## 2016-12-06 NOTE — Progress Notes (Signed)
Patient has concern for sats dropping last night while on BIPAP and is refusing the use of it tonight. BIPAP in room and patient was informed if she changes her mind have RN contact RT.

## 2016-12-07 DIAGNOSIS — J441 Chronic obstructive pulmonary disease with (acute) exacerbation: Secondary | ICD-10-CM

## 2016-12-07 DIAGNOSIS — L97804 Non-pressure chronic ulcer of other part of unspecified lower leg with necrosis of bone: Secondary | ICD-10-CM

## 2016-12-07 DIAGNOSIS — J9621 Acute and chronic respiratory failure with hypoxia: Secondary | ICD-10-CM

## 2016-12-07 DIAGNOSIS — L97909 Non-pressure chronic ulcer of unspecified part of unspecified lower leg with unspecified severity: Secondary | ICD-10-CM

## 2016-12-07 DIAGNOSIS — I5033 Acute on chronic diastolic (congestive) heart failure: Secondary | ICD-10-CM

## 2016-12-07 DIAGNOSIS — R0602 Shortness of breath: Secondary | ICD-10-CM

## 2016-12-07 DIAGNOSIS — N172 Acute kidney failure with medullary necrosis: Secondary | ICD-10-CM

## 2016-12-07 DIAGNOSIS — F418 Other specified anxiety disorders: Secondary | ICD-10-CM

## 2016-12-07 DIAGNOSIS — I83008 Varicose veins of unspecified lower extremity with ulcer other part of lower leg: Secondary | ICD-10-CM

## 2016-12-07 DIAGNOSIS — I83009 Varicose veins of unspecified lower extremity with ulcer of unspecified site: Secondary | ICD-10-CM | POA: Diagnosis present

## 2016-12-07 LAB — BASIC METABOLIC PANEL
Anion gap: 10 (ref 5–15)
BUN: 32 mg/dL — ABNORMAL HIGH (ref 6–20)
CO2: 40 mmol/L — ABNORMAL HIGH (ref 22–32)
Calcium: 9.2 mg/dL (ref 8.9–10.3)
Chloride: 90 mmol/L — ABNORMAL LOW (ref 101–111)
Creatinine, Ser: 1.84 mg/dL — ABNORMAL HIGH (ref 0.44–1.00)
GFR calc Af Amer: 31 mL/min — ABNORMAL LOW (ref >60)
GFR, EST NON AFRICAN AMERICAN: 27 mL/min — AB (ref >60)
GLUCOSE: 106 mg/dL — AB (ref 65–99)
POTASSIUM: 4.2 mmol/L (ref 3.5–5.1)
Sodium: 140 mmol/L (ref 135–145)

## 2016-12-07 LAB — CULTURE, RESPIRATORY: CULTURE: NORMAL

## 2016-12-07 LAB — CBC WITH DIFFERENTIAL/PLATELET
BASOS PCT: 0 %
Basophils Absolute: 0 10*3/uL (ref 0.0–0.1)
Eosinophils Absolute: 0 10*3/uL (ref 0.0–0.7)
Eosinophils Relative: 0 %
HEMATOCRIT: 33.3 % — AB (ref 36.0–46.0)
Hemoglobin: 9.5 g/dL — ABNORMAL LOW (ref 12.0–15.0)
Lymphocytes Relative: 17 %
Lymphs Abs: 1.4 10*3/uL (ref 0.7–4.0)
MCH: 24.5 pg — ABNORMAL LOW (ref 26.0–34.0)
MCHC: 28.5 g/dL — AB (ref 30.0–36.0)
MCV: 85.8 fL (ref 78.0–100.0)
MONO ABS: 1.3 10*3/uL — AB (ref 0.1–1.0)
MONOS PCT: 15 %
NEUTROS ABS: 5.9 10*3/uL (ref 1.7–7.7)
Neutrophils Relative %: 68 %
Platelets: 338 10*3/uL (ref 150–400)
RBC: 3.88 MIL/uL (ref 3.87–5.11)
RDW: 15.2 % (ref 11.5–15.5)
WBC: 8.6 10*3/uL (ref 4.0–10.5)

## 2016-12-07 LAB — MAGNESIUM: Magnesium: 2.2 mg/dL (ref 1.7–2.4)

## 2016-12-07 LAB — CULTURE, RESPIRATORY W GRAM STAIN

## 2016-12-07 LAB — GLUCOSE, CAPILLARY: Glucose-Capillary: 112 mg/dL — ABNORMAL HIGH (ref 65–99)

## 2016-12-07 MED ORDER — IPRATROPIUM-ALBUTEROL 0.5-2.5 (3) MG/3ML IN SOLN
3.0000 mL | Freq: Four times a day (QID) | RESPIRATORY_TRACT | Status: DC
  Administered 2016-12-07 – 2016-12-08 (×5): 3 mL via RESPIRATORY_TRACT
  Filled 2016-12-07 (×5): qty 3

## 2016-12-07 MED ORDER — METHYLPREDNISOLONE SODIUM SUCC 125 MG IJ SOLR
60.0000 mg | Freq: Three times a day (TID) | INTRAMUSCULAR | Status: DC
  Administered 2016-12-07: 60 mg via INTRAVENOUS
  Filled 2016-12-07: qty 2

## 2016-12-07 MED ORDER — FUROSEMIDE 10 MG/ML IJ SOLN
80.0000 mg | Freq: Three times a day (TID) | INTRAMUSCULAR | Status: DC
  Administered 2016-12-07 – 2016-12-10 (×8): 80 mg via INTRAVENOUS
  Filled 2016-12-07 (×8): qty 8

## 2016-12-07 MED ORDER — AMLODIPINE BESYLATE 2.5 MG PO TABS
2.5000 mg | ORAL_TABLET | Freq: Every day | ORAL | Status: DC
  Administered 2016-12-07 – 2016-12-12 (×6): 2.5 mg via ORAL
  Filled 2016-12-07 (×6): qty 1

## 2016-12-07 MED ORDER — AZITHROMYCIN 500 MG PO TABS
500.0000 mg | ORAL_TABLET | Freq: Every day | ORAL | Status: AC
  Administered 2016-12-07 – 2016-12-09 (×3): 500 mg via ORAL
  Filled 2016-12-07 (×3): qty 1

## 2016-12-07 MED ORDER — FUROSEMIDE 10 MG/ML IJ SOLN
80.0000 mg | Freq: Two times a day (BID) | INTRAMUSCULAR | Status: DC
Start: 2016-12-07 — End: 2016-12-07

## 2016-12-07 MED ORDER — ARFORMOTEROL TARTRATE 15 MCG/2ML IN NEBU
15.0000 ug | INHALATION_SOLUTION | Freq: Two times a day (BID) | RESPIRATORY_TRACT | Status: DC
  Administered 2016-12-07 – 2016-12-13 (×12): 15 ug via RESPIRATORY_TRACT
  Filled 2016-12-07 (×11): qty 2

## 2016-12-07 MED ORDER — METHYLPREDNISOLONE SODIUM SUCC 125 MG IJ SOLR
60.0000 mg | Freq: Two times a day (BID) | INTRAMUSCULAR | Status: DC
  Administered 2016-12-08: 60 mg via INTRAVENOUS
  Filled 2016-12-07: qty 2

## 2016-12-07 MED ORDER — BUDESONIDE 0.5 MG/2ML IN SUSP
0.5000 mg | Freq: Two times a day (BID) | RESPIRATORY_TRACT | Status: DC
  Administered 2016-12-07 – 2016-12-13 (×12): 0.5 mg via RESPIRATORY_TRACT
  Filled 2016-12-07 (×12): qty 2

## 2016-12-07 NOTE — Consult Note (Signed)
Name: Amanda Horton MRN: [id number] DOB: 04-17-1946    ADMISSION DATE:  12/03/2016 CONSULTATION DATE:  Ernst Breach  REFERRING MD :  Radford Pax (cards)   CHIEF COMPLAINT:  Dyspnea, ?AECOPD   BRIEF PATIENT DESCRIPTION: 71yo female with hx OSA, COPD on 2L home O2 (previously followed by Dr. Joya Gaskins but lost to f/u), OSA, chronic combined CHF, CKD 3, HTN initially admitted 12/31 with acute on chronic respiratory failure thought r/t acute exacerbation of CHF.  Cardiology consulted and pt diuresed well, now NET neg but ongoing dyspnea so PCCM consulted for ?AECOPD.   SIGNIFICANT EVENTS    STUDIES:  Echo 1/2>>> Normal LV  size with moderate LV hypertrophy. EF 55%. Septal-lateral   dyssynchrony with septal hypokinesis. Normal RV size and systolic   function. No significant valvular abnormalities.   HISTORY OF PRESENT ILLNESS:   71yo female with hx COPD on 2L home O2, OSA, chronic combined CHF, CKD 3, HTN initially admitted 12/31 with acute on chronic respiratory failure thought r/t acute exacerbation of CHF.  Cardiology consulted and pt diuresed well, now NET neg but ongoing dyspnea so PCCM consulted for ?AECOPD.   Currently c/o dyspnea although somewhat improved, cough productive of thick sputum, ongoing orthopnea but also improved.   PAST MEDICAL HISTORY :   has a past medical history of Acute hyperkalemia; Acute hypokalemia; Acute on chronic renal failure ([place]); Allergic rhinitis; Anemia due to chronic kidney disease; APC (atrial premature contractions); Arthritis of lumbar spine ([place]); Benign essential hypertension; Borderline diabetes; Chronic insomnia; Chronic venous insufficiency; COPD (chronic obstructive pulmonary disease) ([facility]); GERD (gastroesophageal reflux disease); Hyperlipemia; Hypoxia; Lumbar disc narrowing; On home oxygen therapy; OSA (obstructive sleep apnea); Osteoporosis; Respiratory failure requiring intubation ([place]); Spinal stenosis of lumbar region without neurogenic claudication;  Varicose veins with pain; Vitamin B12 deficiency; and Vitamin D deficiency.  has a past surgical history that includes Partial hysterectomy; I&D extremity (Right, 08/30/2016); Application if wound vac (Right, 08/30/2016); Application of a-cell of extremity (Right, 08/30/2016); Incision and drainage of wound (Right, 09/20/2016); Application of a-cell of extremity (Right, [id number]); and Application if wound vac (Right, 09/20/2016). Prior to Admission medications   Medication Sig Start Date End Date Taking? Authorizing Provider  albuterol (PROVENTIL HFA;VENTOLIN HFA) 108 (90 BASE) MCG/ACT inhaler Inhale 2 puffs into the lungs every 6 (six) hours as needed for wheezing or shortness of breath.   Yes Historical Provider, MD  albuterol (PROVENTIL) (2.5 MG/3ML) 0.083% nebulizer solution Take 3 mLs (2.5 mg total) by nebulization every 6 (six) hours as needed for wheezing or shortness of breath. 05/04/15  Yes Elsie Stain, MD  alendronate (FOSAMAX) 70 MG tablet Take 70 mg by mouth once a week. Take with a full glass of water on an empty stomach.   Yes Historical Provider, MD  aspirin 81 MG tablet Take 81 mg by mouth daily.   Yes Historical Provider, MD  Calcium Carbonate-Vitamin D 600-400 MG-UNIT per tablet Take 1 tablet by mouth 2 (two) times daily.   Yes Historical Provider, MD  Cholecalciferol (VITAMIN D PO) Take 5,000 Units by mouth daily.   Yes Historical Provider, MD  cyanocobalamin 500 MCG tablet Take 500 mcg by mouth daily.   Yes Historical Provider, MD  escitalopram (LEXAPRO) 20 MG tablet Take 20 mg by mouth at bedtime.   Yes Historical Provider, MD  furosemide (LASIX) 80 MG tablet Take 1 tablet by mouth daily. 11/06/14  Yes Historical Provider, MD  guaiFENesin (MUCINEX) 600 MG 12 hr tablet Take 600 mg by mouth 2 (  two) times daily as needed for cough or to loosen phlegm.    Yes Historical Provider, MD  ipratropium-albuterol (DUONEB) 0.5-2.5 (3) MG/3ML SOLN Take 3 mLs by nebulization every 4 (four)  hours as needed (for breathing).   Yes Historical Provider, MD  linaclotide (LINZESS) 145 MCG CAPS capsule Take 145 mcg by mouth daily before breakfast.   Yes Historical Provider, MD  LORazepam (ATIVAN) 1 MG tablet Take 1 tablet (1 mg total) by mouth every 8 (eight) hours as needed for anxiety. 09/01/16  Yes Verlee Monte, MD  metoprolol tartrate (LOPRESSOR) 25 MG tablet Take 1 tablet by mouth 2 (two) times daily. 11/06/14  Yes Historical Provider, MD  mometasone-formoterol (DULERA) 200-5 MCG/ACT AERO Inhale 2 puffs into the lungs 2 (two) times daily.   Yes Historical Provider, MD  omeprazole (PRILOSEC) 40 MG capsule Take 1 capsule by mouth daily. 11/06/14  Yes Historical Provider, MD  oxyCODONE-acetaminophen (PERCOCET/ROXICET) 5-325 MG tablet Take 1 tablet by mouth every 6 (six) hours as needed for moderate pain. 09/01/16  Yes Verlee Monte, MD  potassium chloride SA (K-DUR,KLOR-CON) 20 MEQ tablet Take 1 tablet by mouth 2 (two) times daily. 11/06/14  Yes Historical Provider, MD  ranitidine (ZANTAC) 150 MG tablet Take 1 tablet by mouth 2 (two) times daily. 11/06/14  Yes Historical Provider, MD  temazepam (RESTORIL) 15 MG capsule Take 1 capsule (15 mg total) by mouth at bedtime as needed for sleep. 09/01/16  Yes Verlee Monte, MD  Tiotropium Bromide Monohydrate 2.5 MCG/ACT AERS Inhale 2 puffs into the lungs daily.   Yes Historical Provider, MD  Vitamin D, Ergocalciferol, (DRISDOL) 50000 UNITS CAPS capsule Take 1 capsule by mouth every 7 (seven) days. 11/06/14  Yes Historical Provider, MD  ACCU-CHEK SMARTVIEW test strip  02/16/15   Historical Provider, MD  sulfamethoxazole-trimethoprim (BACTRIM DS,SEPTRA DS) 800-160 MG tablet Take 1 tablet by mouth 2 (two) times daily.    Historical Provider, MD   Allergies  Allergen Reactions  . Sulfa Antibiotics Nausea And Vomiting and Other (See Comments)    "Stayed sick"    FAMILY HISTORY:  family history includes Asthma in her father; Emphysema in her father; Throat  cancer in her mother. SOCIAL HISTORY:  reports that she quit smoking about 4 years ago. Her smoking use included Cigarettes. She has a 55.00 pack-year smoking history. She has never used smokeless tobacco. She reports that she does not drink alcohol or use drugs.  REVIEW OF SYSTEMS:   As per HPI - All other systems reviewed and were neg.    SUBJECTIVE:   VITAL SIGNS: Temp:  [97.7 F (36.5 C)-98.8 F (37.1 C)] 98.8 F (37.1 C) (01/04 0806) Pulse Rate:  [76-98] 76 (01/04 0400) Resp:  [16-20] 17 (01/04 0400) BP: (145-158)/(73-92) 153/74 (01/04 1206) SpO2:  [95 %-100 %] 95 % (01/04 0954) Weight:  [112.9 kg (248 lb 12.8 oz)] 112.9 kg (248 lb 12.8 oz) (01/04 0400)  PHYSICAL EXAMINATION: General:  Pleasant, obese female, NAD in chair  Neuro:  Awake, alert HEENT:  Mm moist  Cardiovascular:  s1s2 distant  Lungs:  resps even, mildly labored on [place], exp wheeze throughout, bibasilar crackles  Abdomen:  Round, soft, mildly distended  Musculoskeletal:  Warm and dry, 1-2+ BLE edema   Recent Labs Lab 12/05/16 0302 12/06/16 0300 12/07/16 0337  NA 135 139 140  K 3.9 4.7 4.2  CL 88* 93* 90*  CO2 36* 44* 40*  BUN 24* 32* 32*  CREATININE 2.11* 1.91* 1.84*  GLUCOSE 197* 185*  106*    Recent Labs Lab 12/05/16 0302 12/06/16 0300 12/07/16 0337  HGB 8.3* 8.7* 9.5*  HCT 28.0* 30.2* 33.3*  WBC 11.0* 8.2 8.6  PLT 274 300 338   Dg Chest Port 1 View  Result Date: 12/06/2016 CLINICAL DATA:  Shortness of breath and coughing. Symptoms are improving. EXAM: PORTABLE CHEST 1 VIEW COMPARISON:  12/03/2016 FINDINGS: Heart size is at the upper limits of normal. Mediastinal shadows are normal. Minimal basilar atelectasis persists, improving. No worsening or new findings. No effusions. No acute bone finding. IMPRESSION: Improving basilar atelectasis . Electronically Signed   By: Nelson Chimes M.D.   On: 12/06/2016 08:09    ASSESSMENT / PLAN:  Acute on chronic respiratory failure - r/t acute on chronic  CHF, decompensated OSA and AECOPD.  OSA - previously wore bipap qhs but stopped because it "wasnt keeping my oxygen level up" AECOPD   PLAN -  Solumedrol 60mg  IV q8h  Continue abx per primary- rocephin, aizthro  Ongoing diuresis  Aggressive pulm hygiene  F/u CXR  Mucolytic  BD's  qhs bipap  Needs outpt pulm f/u with PFT;s once acute issues resolved  Nickolas Madrid, NP 12/07/2016  12:16 PM Pager: [phone] or [phone]  ATTENDING NOTE / ATTESTATION NOTE :   I have discussed the case with the resident/APP  Nickolas Madrid NP.   I agree with the resident/APP's  history, physical examination, assessment, and plans.    I have edited the above note and modified it according to our agreed history, physical examination, assessment and plan.    Briefly, 71yo female with hx OSA, COPD on 2L home O2 (previously followed by Dr. Joya Gaskins but lost to f/u), OSA, chronic combined CHF, CKD 3, HTN initially admitted 12/31 with acute on chronic respiratory failure thought r/t acute exacerbation of CHF. Patient has been diuresed but is only marginally improved. P CCM was asked to help with management of respiratory issues.  PE as above. Patient seen, comfortable. Blood pressure 154/76, heart rate 80s, respiratory rate 20, sats of 94% on 2 L. On admission, she said she was 85% on 2 L. She is chronically on 2 L. Morbidly obese. Crowded airway. Difficult to examine neck veins. Crackles at the bases. Good S1 and S2. No murmur. Abdomen was benign. Grade 2 edema.  Assessment and Plan Patient has acute on chronic hypoxemic hypercapnic respiratory failure secondary to AECOPD + untreated OSA + OHS + CHF exacerbation. She is on 2 L oxygen at baseline and her O2 saturation is acceptable currently on 2 L. We will start her on Pulmicort and Brovana via nebulizer twice a day. On discharge, we can keep her on these neb medicine or place her on Spiriva and LABA/ICS combination ( I think she was also on [facility]  before). Depends on her preference. Cont IV steroids for now > suggest quick wean. Continue diuresis per cardiology. She will need a sleep study as an outpatient. She had a BiPAP machine but has not been using it. I discussed with her the importance of being on BiPAP machine. While she is admitted, we can try her on BiPAP at bedtime and when necessary for dyspnea. Needs Follow-up will pulmonary on discharge.  Family : No family at bedside.    Monica Becton, MD 12/07/2016, 4:34 PM [facility] Pager [phone] After 3 pm or if no answer, call [phone]

## 2016-12-07 NOTE — Care Management Note (Signed)
Case Management Note  Patient Details  Name: Amanda Horton MRN: [id number] Date of Birth: 12-12-1945  Subjective/Objective:     COPD exacerbation               Action/Plan: Discharge Planning: NCM spoke to pt. States she has RW, neb machine, CPAP and oxygen at home. Oxygen with [facility]. States she had [facility] in the past. Waiting recommendations for home.   PCP Nicoletta Dress MD    Expected Discharge Date:                Expected Discharge Plan:  Home/Self Care  In-House Referral:  NA  Discharge planning Services  CM Consult  Post Acute Care Choice:  Home Health Choice offered to:  Patient  DME Arranged:  N/A DME Agency:  NA  HH Arranged:    [place] Agency:     Status of Service:  In process, will continue to follow  If discussed at Long Length of Stay Meetings, dates discussed:    Additional Comments:  Erenest Rasher, RN 12/07/2016, 4:17 PM

## 2016-12-07 NOTE — Progress Notes (Signed)
PHARMACIST - PHYSICIAN COMMUNICATION DR:   Sherral Hammers CONCERNING: Antibiotic IV to Oral Route Change Policy  RECOMMENDATION: This patient is receiving Azithromycin by the intravenous route.  Based on criteria approved by the Pharmacy and Therapeutics Committee, the antibiotic(s) is/are being converted to the equivalent oral dose form(s).   DESCRIPTION: These criteria include:  Patient being treated for a respiratory tract infection, urinary tract infection, cellulitis or clostridium difficile associated diarrhea if on metronidazole  The patient is not neutropenic and does not exhibit a GI malabsorption state  The patient is eating (either orally or via tube) and/or has been taking other orally administered medications for a least 24 hours  The patient is improving clinically and has a Tmax < 100.5  If you have questions about this conversion, please contact the Pharmacy Department  []   [phone] )  [facility] []   [phone] )  [facility] [x]   [phone] )  [facility] []   [phone] )  [facility] []   [phone] )  [facility]    Thank you for allowing pharmacy to be a part of this patient's care.  Alycia Rossetti, PharmD, BCPS Clinical Pharmacist Pager: [phone] 12/07/2016 12:55 PM

## 2016-12-07 NOTE — Progress Notes (Addendum)
PROGRESS NOTE    Amanda Horton  [id number] DOB: 05/28/1946 DOA: 12/03/2016 PCP: Nicoletta Dress, MD   Brief Narrative:   71 y.o.WF PMHx Depression and Anxiety COPD on 2 L oxygen at home, OSA, Combined chronic Diastolic and Systolic CHF (last 2 D ECHO in 08/2016 with EF 45% and grade 1 DD), CKD stage 3(baseline Cr 1.2 in 08/2016), HTN, HLD,  She presented to [facility] with worsening shortness of breath at rest and with exertion over past few days prior to this admission.  She used inhaler and nebulizer at home but with no significant symptomatic relief. She reported associated cough productive of clear to whitish sputum. She also had more frequent awakenings at night time due to shortness of breath and cough. She also reported subjective fevers at home. No chest pain, no palpations. She had some nausea but no vomiting. No abdominal pain, no diarrhea or constipation. She was seen in urgent care where herr oxygen saturation was in mid 80's and for this reason she was referred to ED for further evaluation.   ED Course: In ED, BP was 128/61, T max 101.69F, HR 106-151, RR 18-30, oxygen saturation 92% with BiPAP which has improved to 100%. At this time she is on 5 L [place] and her O2 sa is 95%. Blood work was notable for WBC count 14, hemoglobin 9.1, CO@ 34, Cr 1.67 (baseline in 08/2016 was 1.2), troponin was 0.16, normal lactic acid. BNP was 165. The 12 lead EKG showed atypical LBBB. CXR showed atelectasis but infection not excluded. She was given solumedrol 125 mg IV once and continuous nebs. She continues to have shortness of breath and wheezing. In addition, sepsis criteria met on admission with source likely pneumonia so azithromycin and rocephin started in ED.    Subjective: 1/4 A/O 4, positive acute on chronic respiratory failure. Positive nonproductive cough. Positive increased SOB, negative CP. Improved from 1/2 currently on home O2 setting of 2 L O2   Assessment & Plan:   Principal Problem:  Acute on chronic respiratory failure with hypoxia (HCC) Active Problems:   GERD (gastroesophageal reflux disease)   OSA (obstructive sleep apnea)   Essential hypertension   COPD exacerbation (HCC)   Sepsis due to pneumonia (HCC)   Leukocytosis   Troponin level elevated   Chronic combined systolic and diastolic CHF (congestive heart failure) (HCC)   Acute renal failure superimposed on stage 3 chronic kidney disease (HCC)   Anemia of chronic kidney failure   Metabolic alkalosis   Anxiety and depression   Elevated troponin   Anemia of chronic kidney failure, stage 4 (severe) (HCC)   Venous stasis ulcer limited to breakdown of skin with varicose veins (HCC)   Acute on chronic respiratory failure with hypoxia (HCC) / COPD with acute exacerbation (HCC) / Oxygen dependent  - Acute respiratory failure with hypoxia likely due to combination of COPD and pneumonia - COPD gold / focused order set utilized - Solu-Medrol continue 60 mg IV Q 12 hours -DuoNeb QID -Xopenex TID PRN -Flutter valve -BiPAP QHS to maintain SPO2 89-93% -Titrate O2 during waking hours to maintain SPO2 89-93%; currently on 2 L [place] oxygen support. Baseline 2 L   Sepsis due to pneumonia ([place]) /Leukocytosis - Sepsis criteria met on admission with fever, tachycardia, tachypnea, hypoxia, leukocytosis and source of infection likely pneumonia - CXR with atelectasis but infection cannot be ruled out -See COPD exacerbation   Chronic combined systolic and diastolic CHF (congestive heart failure) ([place]) - 2 D ECHO in 08/2016  with EF 45% and grade 1 DD - strict in and out since admission -812m -Daily weight Filed Weights   12/05/16 0300 12/06/16 0445 12/07/16 0400  Weight: 115.5 kg (254 lb 10.1 oz) 115.1 kg (253 lb 12 oz) 112.9 kg (248 lb 12.8 oz)  -Unknown base weight - Metoprolol 25 mg BID -1/4 increase Lasix 80 mg TID -Transfuse for hemoglobin<8  Troponin level elevated - Likely demand ischemia from hypoxia, CKD -  No chest pain - The 12 lead EKG on admission showed sinus tachycardia, atypical LBBB - Last 2 D ECHO in 08/2016 showed EF 45% with grade 1 DD -Troponin trending down - Continue aspirin daily  - Echocardiogram: Slightly decreased EF see results below  Essential hypertension -See CHF  Acute renal failure superimposed on CKD stage 3 ( Baseline Cr In 08/2016 1.2) -Presented with a creatinine of 1.67 - Acute elevation in Cr likely due to sepsis, acute infection but also possibly lasix - Discontinue Lasix and monitor Lab Results  Component Value Date   CREATININE 1.84 (H) 12/07/2016   CREATININE 1.91 (H) 12/06/2016   CREATININE 2.11 (H) 12/05/2016  -Improving  Anemia of chronic kidney failure - Hemoglobin stable  Metabolic alkalosis? - ABG in a.m.  Anxiety and depression - Continue lexapro and ativan every 8 hours PRN  Chronic venous stasis changes bilateral lower extremities - WOC consulted    DVT prophylaxis: Heparin per pharmacy Code Status: Full Family Communication: None Disposition Plan: Resolution sepsis   Consultants:  None  Procedures/Significant Events:  1/2 Echocardiogram:Left ventricle:  moderate LVH. -LVEF = 55%. Septal-lateral dyssynchrony. Septum appears hypokinetic. - (grade 1 diastolic dysfunction).   VENTILATOR SETTINGS: BiPAP QHS I/E 12/6    Cultures 12/31 HIV negative 12/31 strep pneumo urine antigen negative 1/1 influenza A negative 1/1 respiratory virus panel negative    Antimicrobials: Anti-infectives    Start     Stop   12/04/16 2000  cefTRIAXone (ROCEPHIN) 1 g in dextrose 5 % 50 mL IVPB         12/04/16 2000  azithromycin (ZITHROMAX) 500 mg in dextrose 5 % 250 mL IVPB         12/03/16 2015  cefTRIAXone (ROCEPHIN) 1 g in dextrose 5 % 50 mL IVPB  Status:  Discontinued     12/03/16 2244   12/03/16 2015  azithromycin (ZITHROMAX) 500 mg in dextrose 5 % 250 mL IVPB  Status:  Discontinued     12/03/16 2244   12/03/16  1645  cefTRIAXone (ROCEPHIN) 1 g in dextrose 5 % 50 mL IVPB     12/03/16 1756   12/03/16 1645  azithromycin (ZITHROMAX) 500 mg in dextrose 5 % 250 mL IVPB            Devices NA   LINES / TUBES:  NA    Continuous Infusions: . sodium chloride 10 mL/hr at 12/03/16 2239     Objective: Vitals:   12/07/16 1249 12/07/16 1302 12/07/16 1705 12/07/16 1709  BP:  (!) 153/76  130/65  Pulse:      Resp:      Temp:  98.2 F (36.8 C)  98.7 F (37.1 C)  TempSrc:  Axillary  Axillary  SpO2: 96%  96%   Weight:      Height:        Intake/Output Summary (Last 24 hours) at 12/07/16 1757 Last data filed at 12/07/16 0914  Gross per 24 hour  Intake  423 ml  Output             1900 ml  Net            -1477 ml   Filed Weights   12/05/16 0300 12/06/16 0445 12/07/16 0400  Weight: 115.5 kg (254 lb 10.1 oz) 115.1 kg (253 lb 12 oz) 112.9 kg (248 lb 12.8 oz)    Examination:  General: A/O 4, positive acute on chronic respiratory distress Eyes: negative scleral hemorrhage, negative anisocoria, negative icterus ENT: Negative Runny nose, negative gingival bleeding, Neck:  Negative scars, masses, torticollis, lymphadenopathy, JVD Lungs: views poor air movement, diffuse expiratory wheeze, negative crackles Cardiovascular: Regular rate and rhythm without murmur gallop or rub normal S1 and S2 Abdomen: MORBIDLY OBESE, negative abdominal pain, nondistended, positive soft, bowel sounds, no rebound, no ascites, no appreciable mass Extremities: positive bilateral lower extremity edema 2-3+  Skin: Bilateral lower extremity since ulcers  Psychiatric:  Negative depression, negative anxiety, negative fatigue, negative mania  Central nervous system:  Cranial nerves II through XII intact, tongue/uvula midline, all extremities muscle strength 5/5, sensation intact throughout,  negative dysarthria, negative expressive aphasia, negative receptive aphasia.  .     Data Reviewed: Care during the  described time interval was provided by me .  I have reviewed this patient's available data, including medical history, events of note, physical examination, and all test results as part of my evaluation. I have personally reviewed and interpreted all radiology studies.  CBC:  Recent Labs Lab 12/03/16 1545 12/04/16 0415 12/05/16 0302 12/06/16 0300 12/07/16 0337  WBC 14.0* 11.0* 11.0* 8.2 8.6  NEUTROABS 11.6*  --   --  7.4 5.9  HGB 9.1* 8.4* 8.3* 8.7* 9.5*  HCT 31.7* 29.1* 28.0* 30.2* 33.3*  MCV 84.3 83.9 83.3 84.8 85.8  PLT 272 258 274 300 272   Basic Metabolic Panel:  Recent Labs Lab 12/03/16 1545 12/04/16 0415 12/05/16 0302 12/06/16 0300 12/07/16 0337  NA 133* 137 135 139 140  K 3.5 3.7 3.9 4.7 4.2  CL 86* 88* 88* 93* 90*  CO2 34* 36* 36* 44* 40*  GLUCOSE 132* 154* 197* 185* 106*  BUN 11 14 24* 32* 32*  CREATININE 1.67* 1.75* 2.11* 1.91* 1.84*  CALCIUM 8.7* 8.6* 8.4* 8.8* 9.2  MG  --   --   --  2.2 2.2   GFR: Estimated Creatinine Clearance: 33.8 mL/min (by C-G formula based on SCr of 1.84 mg/dL (H)). Liver Function Tests:  Recent Labs Lab 12/03/16 1545 12/04/16 0415 12/05/16 0302  AST '19 16 15  '$ ALT 9* 9* 9*  ALKPHOS 58 52 49  BILITOT 0.6 0.4 0.1*  PROT 6.7 6.1* 6.2*  ALBUMIN 2.8* 2.5* 2.8*   No results for input(s): LIPASE, AMYLASE in the last 168 hours. No results for input(s): AMMONIA in the last 168 hours. Coagulation Profile: No results for input(s): INR, PROTIME in the last 168 hours. Cardiac Enzymes:  Recent Labs Lab 12/03/16 1545 12/03/16 2158 12/04/16 0415 12/04/16 1150  TROPONINI 0.16* 0.09* 0.03* 0.09*   BNP (last 3 results) No results for input(s): PROBNP in the last 8760 hours. HbA1C: No results for input(s): HGBA1C in the last 72 hours. CBG:  Recent Labs Lab 12/04/16 0818 12/05/16 0740 12/06/16 0849 12/07/16 0803  GLUCAP 155* 160* 159* 112*   Lipid Profile: No results for input(s): CHOL, HDL, LDLCALC, TRIG, CHOLHDL,  LDLDIRECT in the last 72 hours. Thyroid Function Tests: No results for input(s): TSH, T4TOTAL, FREET4, T3FREE, THYROIDAB in the  last 72 hours. Anemia Panel: No results for input(s): VITAMINB12, FOLATE, FERRITIN, TIBC, IRON, RETICCTPCT in the last 72 hours. Urine analysis:    Component Value Date/Time   COLORURINE YELLOW 12/03/2016 1535   APPEARANCEUR HAZY (A) 12/03/2016 1535   LABSPEC 1.013 12/03/2016 1535   PHURINE 5.0 12/03/2016 1535   GLUCOSEU NEGATIVE 12/03/2016 1535   HGBUR MODERATE (A) 12/03/2016 1535   BILIRUBINUR NEGATIVE 12/03/2016 1535   KETONESUR NEGATIVE 12/03/2016 1535   PROTEINUR 100 (A) 12/03/2016 1535   NITRITE NEGATIVE 12/03/2016 1535   LEUKOCYTESUR NEGATIVE 12/03/2016 1535   Sepsis Labs: '@LABRCNTIP'$ (procalcitonin:4,lacticidven:4)  ) Recent Results (from the past 240 hour(s))  Urine culture     Status: Abnormal   Collection Time: 12/03/16  3:35 PM  Result Value Ref Range Status   Specimen Description URINE, RANDOM  Final   Special Requests NONE  Final   Culture MULTIPLE SPECIES PRESENT, SUGGEST RECOLLECTION (A)  Final   Report Status 12/05/2016 FINAL  Final  Blood Culture (routine x 2)     Status: None (Preliminary result)   Collection Time: 12/03/16  3:38 PM  Result Value Ref Range Status   Specimen Description BLOOD LEFT FOREARM  Final   Special Requests IN PEDIATRIC BOTTLE 2CC  Final   Culture NO GROWTH 4 DAYS  Final   Report Status PENDING  Incomplete  Blood Culture (routine x 2)     Status: None (Preliminary result)   Collection Time: 12/03/16  3:45 PM  Result Value Ref Range Status   Specimen Description BLOOD RIGHT ANTECUBITAL  Final   Special Requests IN PEDIATRIC BOTTLE 2CC  Final   Culture NO GROWTH 4 DAYS  Final   Report Status PENDING  Incomplete  MRSA PCR Screening     Status: None   Collection Time: 12/03/16  9:45 PM  Result Value Ref Range Status   MRSA by PCR NEGATIVE NEGATIVE Final    Comment:        The GeneXpert MRSA Assay  (FDA approved for NASAL specimens only), is one component of a comprehensive MRSA colonization surveillance program. It is not intended to diagnose MRSA infection nor to guide or monitor treatment for MRSA infections.   Culture, sputum-assessment     Status: None   Collection Time: 12/03/16  9:58 PM  Result Value Ref Range Status   Specimen Description Expect. Sput  Final   Special Requests NONE  Final   Sputum evaluation THIS SPECIMEN IS ACCEPTABLE FOR SPUTUM CULTURE  Final   Report Status 12/05/2016 FINAL  Final  Culture, respiratory (NON-Expectorated)     Status: None   Collection Time: 12/03/16  9:58 PM  Result Value Ref Range Status   Specimen Description Expect. Sput  Final   Special Requests NONE Reflexed from [id number]  Final   Gram Stain   Final    FEW WBC PRESENT, PREDOMINANTLY PMN FEW SQUAMOUS EPITHELIAL CELLS PRESENT MODERATE GRAM POSITIVE COCCOBACILLUS FEW GRAM NEGATIVE COCCOBACILLI    Culture Consistent with normal respiratory flora.  Final   Report Status 12/07/2016 FINAL  Final  Respiratory Panel by PCR     Status: None   Collection Time: 12/04/16 12:21 PM  Result Value Ref Range Status   Adenovirus NOT DETECTED NOT DETECTED Final   Coronavirus 229E NOT DETECTED NOT DETECTED Final   Coronavirus HKU1 NOT DETECTED NOT DETECTED Final   Coronavirus NL63 NOT DETECTED NOT DETECTED Final   Coronavirus OC43 NOT DETECTED NOT DETECTED Final   Metapneumovirus NOT DETECTED NOT DETECTED Final  Rhinovirus / Enterovirus NOT DETECTED NOT DETECTED Final   Influenza A NOT DETECTED NOT DETECTED Final   Influenza B NOT DETECTED NOT DETECTED Final   Parainfluenza Virus 1 NOT DETECTED NOT DETECTED Final   Parainfluenza Virus 2 NOT DETECTED NOT DETECTED Final   Parainfluenza Virus 3 NOT DETECTED NOT DETECTED Final   Parainfluenza Virus 4 NOT DETECTED NOT DETECTED Final   Respiratory Syncytial Virus NOT DETECTED NOT DETECTED Final   Bordetella pertussis NOT DETECTED NOT DETECTED  Final   Chlamydophila pneumoniae NOT DETECTED NOT DETECTED Final   Mycoplasma pneumoniae NOT DETECTED NOT DETECTED Final         Radiology Studies: Dg Chest Port 1 View  Result Date: 12/06/2016 CLINICAL DATA:  Shortness of breath and coughing. Symptoms are improving. EXAM: PORTABLE CHEST 1 VIEW COMPARISON:  12/03/2016 FINDINGS: Heart size is at the upper limits of normal. Mediastinal shadows are normal. Minimal basilar atelectasis persists, improving. No worsening or new findings. No effusions. No acute bone finding. IMPRESSION: Improving basilar atelectasis . Electronically Signed   By: Nelson Chimes M.D.   On: 12/06/2016 08:09        Scheduled Meds: . amLODipine  2.5 mg Oral Daily  . arformoterol  15 mcg Nebulization BID  . aspirin  81 mg Oral Daily  . azithromycin  500 mg Oral Daily  . budesonide (PULMICORT) nebulizer solution  0.5 mg Nebulization BID  . calcium-vitamin D  1 tablet Oral BID WC  . cefTRIAXone (ROCEPHIN)  IV  1 g Intravenous Q24H  . docusate sodium  100 mg Oral Daily  . escitalopram  20 mg Oral QHS  . famotidine  20 mg Oral Daily  . furosemide  80 mg Intravenous BID  . heparin subcutaneous  5,000 Units Subcutaneous Q8H  . ipratropium-albuterol  3 mL Nebulization QID  . linaclotide  145 mcg Oral QAC breakfast  . mouth rinse  15 mL Mouth Rinse BID  . methylPREDNISolone (SOLU-MEDROL) injection  60 mg Intravenous Q8H  . metoprolol tartrate  25 mg Oral BID  . pantoprazole  40 mg Oral Daily  . potassium chloride SA  20 mEq Oral BID  . sodium chloride flush  3 mL Intravenous Q12H  . cyanocobalamin  500 mcg Oral Daily  . Vitamin D (Ergocalciferol)  50,000 Units Oral Q7 days   Continuous Infusions: . sodium chloride 10 mL/hr at 12/03/16 2239     LOS: 4 days    Time spent: 40 minutes    Rozelle Caudle, Geraldo Docker, MD [facility] Pager [phone]   If 7PM-7AM, please contact night-coverage [email or web address] Password [facility] 12/07/2016, 5:57 PM

## 2016-12-07 NOTE — Care Management Important Message (Signed)
Important Message  Patient Details  Name: Amanda Horton MRN: [id number] Date of Birth: June 13, 1946   Medicare Important Message Given:  Yes    Erenest Rasher, RN 12/07/2016, 4:17 PM

## 2016-12-07 NOTE — Progress Notes (Addendum)
Patient Name: Amanda Horton Date of Encounter: 12/07/2016  Primary Cardiologist: Dr. Sundra Aland Problem List     Principal Problem:   Acute on chronic respiratory failure with hypoxia [facility]) Active Problems:   GERD (gastroesophageal reflux disease)   OSA (obstructive sleep apnea)   Essential hypertension   COPD with acute exacerbation (HCC)   Sepsis due to pneumonia (HCC)   Leukocytosis   Troponin level elevated   Chronic combined systolic and diastolic CHF (congestive heart failure) (HCC)   Acute renal failure superimposed on stage 3 chronic kidney disease (HCC)   Anemia of chronic kidney failure   Metabolic alkalosis   Anxiety and depression   Elevated troponin   Anemia of chronic kidney failure, stage 4 (severe) (HCC)   Venous stasis ulcer limited to breakdown of skin with varicose veins ([place])     Subjective   Feels better today.  Diuresed 2L yesterday.    Inpatient Medications    Scheduled Meds: . aspirin  81 mg Oral Daily  . azithromycin  500 mg Intravenous Q24H  . calcium-vitamin D  1 tablet Oral BID WC  . cefTRIAXone (ROCEPHIN)  IV  1 g Intravenous Q24H  . docusate sodium  100 mg Oral Daily  . escitalopram  20 mg Oral QHS  . famotidine  20 mg Oral Daily  . heparin subcutaneous  5,000 Units Subcutaneous Q8H  . ipratropium-albuterol  3 mL Nebulization QID  . linaclotide  145 mcg Oral QAC breakfast  . mouth rinse  15 mL Mouth Rinse BID  . methylPREDNISolone (SOLU-MEDROL) injection  60 mg Intravenous Daily  . metoprolol tartrate  25 mg Oral BID  . pantoprazole  40 mg Oral Daily  . potassium chloride SA  20 mEq Oral BID  . sodium chloride flush  3 mL Intravenous Q12H  . cyanocobalamin  500 mcg Oral Daily  . Vitamin D (Ergocalciferol)  50,000 Units Oral Q7 days   Continuous Infusions: . sodium chloride 10 mL/hr at 12/03/16 2239   PRN Meds: acetaminophen **OR** [DISCONTINUED] acetaminophen, guaiFENesin, levalbuterol, LORazepam, ondansetron **OR**  ondansetron (ZOFRAN) IV, oxyCODONE-acetaminophen, temazepam   Vital Signs    Vitals:   12/07/16 0400 12/07/16 0806 12/07/16 0913 12/07/16 0954  BP: (!) 145/75 (!) 149/81 (!) 147/78   Pulse: 76     Resp: 17     Temp: 98 F (36.7 C) 98.8 F (37.1 C)    TempSrc: Oral Axillary    SpO2: 95% 98%  95%  Weight: 248 lb 12.8 oz (112.9 kg)     Height:        Intake/Output Summary (Last 24 hours) at 12/07/16 1140 Last data filed at 12/07/16 0914  Gross per 24 hour  Intake              663 ml  Output             1900 ml  Net            -1237 ml   Filed Weights   12/05/16 0300 12/06/16 0445 12/07/16 0400  Weight: 254 lb 10.1 oz (115.5 kg) 253 lb 12 oz (115.1 kg) 248 lb 12.8 oz (112.9 kg)    Physical Exam    GEN: Well nourished, well developed, in no acute distress.  HEENT: Grossly normal.  Neck: Supple, no JVD, carotid bruits, or masses. Cardiac: RRR, no murmurs, rubs, or gallops. No clubbing, cyanosis.  Trace edema.  Radials/DP/PT 2+ and equal bilaterally.  Respiratory:  Respirations regular and unlabored,  scattered wheezes GI: Soft, nontender, nondistended, BS + x 4.  Abdomen firm MS: no deformity or atrophy. Skin: warm and dry, no rash. Neuro:  Strength and sensation are intact. Psych: AAOx3.  Normal affect.  Labs    CBC  Recent Labs  12/06/16 0300 12/07/16 0337  WBC 8.2 8.6  NEUTROABS 7.4 5.9  HGB 8.7* 9.5*  HCT 30.2* 33.3*  MCV 84.8 85.8  PLT 300 [id number]   Basic Metabolic Panel  Recent Labs  12/06/16 0300 12/07/16 0337  NA 139 140  K 4.7 4.2  CL 93* 90*  CO2 44* 40*  GLUCOSE 185* 106*  BUN 32* 32*  CREATININE 1.91* 1.84*  CALCIUM 8.8* 9.2  MG 2.2 2.2   Liver Function Tests  Recent Labs  12/05/16 0302  AST 15  ALT 9*  ALKPHOS 49  BILITOT 0.1*  PROT 6.2*  ALBUMIN 2.8*   No results for input(s): LIPASE, AMYLASE in the last 72 hours. Cardiac Enzymes  Recent Labs  12/04/16 1150  TROPONINI 0.09*   BNP Invalid input(s): POCBNP D-Dimer No  results for input(s): DDIMER in the last 72 hours. Hemoglobin A1C No results for input(s): HGBA1C in the last 72 hours. Fasting Lipid Panel No results for input(s): CHOL, HDL, LDLCALC, TRIG, CHOLHDL, LDLDIRECT in the last 72 hours. Thyroid Function Tests No results for input(s): TSH, T4TOTAL, T3FREE, THYROIDAB in the last 72 hours.  Invalid input(s): FREET3  Telemetry    NSR - Personally Reviewed  ECG    NSR - Personally Reviewed  Radiology    Dg Chest Port 1 View  Result Date: 12/06/2016 CLINICAL DATA:  Shortness of breath and coughing. Symptoms are improving. EXAM: PORTABLE CHEST 1 VIEW COMPARISON:  12/03/2016 FINDINGS: Heart size is at the upper limits of normal. Mediastinal shadows are normal. Minimal basilar atelectasis persists, improving. No worsening or new findings. No effusions. No acute bone finding. IMPRESSION: Improving basilar atelectasis . Electronically Signed   By: Nelson Chimes M.D.   On: 12/06/2016 08:09    Cardiac Studies  2D echo 12/05/2016 Study Conclusions  - Left ventricle: The cavity size was normal. Wall thickness was   increased in a pattern of moderate LVH. The estimated ejection   fraction was 55%. Septal-lateral dyssynchrony. The septum appears   hypokinetic. Doppler parameters are consistent with abnormal left   ventricular relaxation (grade 1 diastolic dysfunction). - Aortic valve: There was no stenosis. - Mitral valve: There was no significant regurgitation. - Right ventricle: The cavity size was normal. Systolic function   was normal. - Pulmonary arteries: No complete TR doppler jet so unable to   estimate PA systolic pressure. - Systemic veins: IVC measured 2.6 cm with < 50% respirophasic   variation, suggesting RA pressure 15 mmHg.  Impressions:  - Technically difficult study with poor acoustic windows. Normal LV   size with moderate LV hypertrophy. EF 55%. Septal-lateral   dyssynchrony with septal hypokinesis. Normal RV size and  systolic   function. No significant valvular abnormalities.  Patient Profile     71yo obese female with a history of chronic diastolic/systolic CHF with EF [id number] (now EF 55% by recent echo), CKD stage 3, HTN, severe oxygen dependent COPD and morbid obesity who presented with increased SOB, hoarseness, nonproductive cough and declining oxygen saturations in the low 80s on day of admission.  On admission she initially required BiPAP.  CXRAY with bibasilar atelectasis but no frank edema but her BNp was mildly elevated.  She was given IV lasix and  has diuresed 1.5L with improved creatinine.  She has not had any chest pain and no change in her chronic LE edema but has noticed some increase in abdominal girth.  Unfortunately she does not weigh herself at home  [place]    1. Dyspnea/Acute on chronic diastolic HF?: Presented with multiple days of worsening dyspnea. Developed a fever prior to admission with nonproductive cough. CXR on admission showed bilateral atelectasis that has improved since admission. She was given IV lasix with 1.2L net neg UOP thus far.Diuresed 1.9L yesterday.  Echo showed slightly improved EF to 55% from 45% back in 9/17. Was given IV antibiotics on admission for sepsis and elevated WBC with some improvement. Reports her breathing is somewhat better but not at her baseline.  -- Does have crackles on exam, likely needs further diuresis with IV lasix but will need to monitor renal function as this has elevated since admission. Creatinine slowly improving with diuresis. Continue Lasix 80mg  IV BID. -- Questions whether her underlying COPD has worsened as she has not had follow up with pulmonary for quite some time.   2. Arrhythmia: Noted to have abnormal beats on telemetry.There was some concern about possible PAF on monitor but all telemetry strips revealed and no obvious PAF.  There is a lot of wandering baseline artifact.  Does have hx of OSA. Currently in a SR at the time of  assessment.   3. HTN: Borderline controlled.  Amlodipine added today. Continue BB.    4. COPD: Reports she was dx in 2007 and was followed by pulmonary for a time, but has not been seen for awhile. Normally wears 2L, but has noticed that she has been becoming more dyspnic at home, and does increase her O2 at times. Question whether this has worsened.   5. Anemia: Hgb 11.1 back in 9/17, now 9.5. Denies any active bleeding, could be contributing to dyspnea.   Signed, Fransico Him, MD  12/07/2016, 11:40 AM

## 2016-12-08 DIAGNOSIS — N184 Chronic kidney disease, stage 4 (severe): Secondary | ICD-10-CM

## 2016-12-08 DIAGNOSIS — D631 Anemia in chronic kidney disease: Secondary | ICD-10-CM

## 2016-12-08 DIAGNOSIS — R0902 Hypoxemia: Secondary | ICD-10-CM | POA: Diagnosis present

## 2016-12-08 DIAGNOSIS — R0602 Shortness of breath: Secondary | ICD-10-CM

## 2016-12-08 LAB — CULTURE, BLOOD (ROUTINE X 2)
CULTURE: NO GROWTH
CULTURE: NO GROWTH

## 2016-12-08 LAB — CBC WITH DIFFERENTIAL/PLATELET
Basophils Absolute: 0 10*3/uL (ref 0.0–0.1)
Basophils Relative: 0 %
EOS ABS: 0 10*3/uL (ref 0.0–0.7)
Eosinophils Relative: 0 %
HEMATOCRIT: 32.6 % — AB (ref 36.0–46.0)
HEMOGLOBIN: 9.5 g/dL — AB (ref 12.0–15.0)
LYMPHS ABS: 0.3 10*3/uL — AB (ref 0.7–4.0)
LYMPHS PCT: 5 %
MCH: 24.7 pg — AB (ref 26.0–34.0)
MCHC: 29.1 g/dL — AB (ref 30.0–36.0)
MCV: 84.9 fL (ref 78.0–100.0)
MONOS PCT: 6 %
Monocytes Absolute: 0.4 10*3/uL (ref 0.1–1.0)
Neutro Abs: 5.9 10*3/uL (ref 1.7–7.7)
Neutrophils Relative %: 89 %
Platelets: 315 10*3/uL (ref 150–400)
RBC: 3.84 MIL/uL — AB (ref 3.87–5.11)
RDW: 15 % (ref 11.5–15.5)
WBC: 6.7 10*3/uL (ref 4.0–10.5)

## 2016-12-08 LAB — GLUCOSE, CAPILLARY: Glucose-Capillary: 174 mg/dL — ABNORMAL HIGH (ref 65–99)

## 2016-12-08 LAB — BASIC METABOLIC PANEL
ANION GAP: 9 (ref 5–15)
BUN: 31 mg/dL — ABNORMAL HIGH (ref 6–20)
CALCIUM: 8.3 mg/dL — AB (ref 8.9–10.3)
CHLORIDE: 87 mmol/L — AB (ref 101–111)
CO2: 42 mmol/L — AB (ref 22–32)
Creatinine, Ser: 1.7 mg/dL — ABNORMAL HIGH (ref 0.44–1.00)
GFR calc Af Amer: 34 mL/min — ABNORMAL LOW (ref >60)
GFR calc non Af Amer: 29 mL/min — ABNORMAL LOW (ref >60)
GLUCOSE: 253 mg/dL — AB (ref 65–99)
Potassium: 3.9 mmol/L (ref 3.5–5.1)
Sodium: 138 mmol/L (ref 135–145)

## 2016-12-08 LAB — MAGNESIUM: Magnesium: 2.1 mg/dL (ref 1.7–2.4)

## 2016-12-08 MED ORDER — METHYLPREDNISOLONE SODIUM SUCC 40 MG IJ SOLR
40.0000 mg | Freq: Two times a day (BID) | INTRAMUSCULAR | Status: DC
Start: 2016-12-08 — End: 2016-12-11
  Administered 2016-12-08 – 2016-12-11 (×6): 40 mg via INTRAVENOUS
  Filled 2016-12-08 (×6): qty 1

## 2016-12-08 MED ORDER — IPRATROPIUM-ALBUTEROL 0.5-2.5 (3) MG/3ML IN SOLN
3.0000 mL | Freq: Three times a day (TID) | RESPIRATORY_TRACT | Status: DC
  Administered 2016-12-08 – 2016-12-13 (×14): 3 mL via RESPIRATORY_TRACT
  Filled 2016-12-08 (×15): qty 3

## 2016-12-08 NOTE — Progress Notes (Signed)
Patient Name: Amanda Horton Date of Encounter: 12/08/2016  Primary Cardiologist: Dr. Sundra Aland Problem List     Principal Problem:   Acute on chronic respiratory failure with hypoxia [facility]) Active Problems:   GERD (gastroesophageal reflux disease)   OSA (obstructive sleep apnea)   Essential hypertension   COPD exacerbation (HCC)   Sepsis due to pneumonia (HCC)   Leukocytosis   Troponin level elevated   Chronic combined systolic and diastolic CHF (congestive heart failure) (HCC)   Acute renal failure superimposed on stage 3 chronic kidney disease (HCC)   Anemia of chronic kidney failure   Metabolic alkalosis   Anxiety and depression   Elevated troponin   Anemia of chronic kidney failure, stage 4 (severe) (HCC)   Venous stasis ulcer limited to breakdown of skin with varicose veins (HCC)   Stasis ulcer (HCC)   SOB (shortness of breath)     Subjective   Feels better today.  Diuresed 2.7L yesterday and net neg 3.4L.  Weight down 2lbs from yesterday and 5 lbs from admit (251>>246lbs)  Inpatient Medications    Scheduled Meds: . amLODipine  2.5 mg Oral Daily  . arformoterol  15 mcg Nebulization BID  . aspirin  81 mg Oral Daily  . azithromycin  500 mg Oral Daily  . budesonide (PULMICORT) nebulizer solution  0.5 mg Nebulization BID  . calcium-vitamin D  1 tablet Oral BID WC  . cefTRIAXone (ROCEPHIN)  IV  1 g Intravenous Q24H  . docusate sodium  100 mg Oral Daily  . escitalopram  20 mg Oral QHS  . famotidine  20 mg Oral Daily  . furosemide  80 mg Intravenous TID  . heparin subcutaneous  5,000 Units Subcutaneous Q8H  . ipratropium-albuterol  3 mL Nebulization QID  . linaclotide  145 mcg Oral QAC breakfast  . mouth rinse  15 mL Mouth Rinse BID  . methylPREDNISolone (SOLU-MEDROL) injection  60 mg Intravenous Q12H  . metoprolol tartrate  25 mg Oral BID  . pantoprazole  40 mg Oral Daily  . potassium chloride SA  20 mEq Oral BID  . sodium chloride flush  3 mL  Intravenous Q12H  . cyanocobalamin  500 mcg Oral Daily  . Vitamin D (Ergocalciferol)  50,000 Units Oral Q7 days   Continuous Infusions: . sodium chloride 10 mL/hr at 12/03/16 2239   PRN Meds: acetaminophen **OR** [DISCONTINUED] acetaminophen, guaiFENesin, levalbuterol, LORazepam, ondansetron **OR** ondansetron (ZOFRAN) IV, oxyCODONE-acetaminophen, temazepam   Vital Signs    Vitals:   12/08/16 0355 12/08/16 0400 12/08/16 0800 12/08/16 0808  BP:  (!) 147/72  (!) 151/73  Pulse:  72    Resp:  12    Temp:  98 F (36.7 C)  98.4 F (36.9 C)  TempSrc:  Oral  Axillary  SpO2:  98% 98%   Weight: 246 lb 12.8 oz (111.9 kg)     Height:        Intake/Output Summary (Last 24 hours) at 12/08/16 1051 Last data filed at 12/08/16 0509  Gross per 24 hour  Intake              170 ml  Output             2750 ml  Net            -2580 ml   Filed Weights   12/06/16 0445 12/07/16 0400 12/08/16 0355  Weight: 253 lb 12 oz (115.1 kg) 248 lb 12.8 oz (112.9 kg) 246 lb 12.8 oz (111.9  kg)    Physical Exam    GEN: Well nourished, well developed, in no acute distress.  HEENT: Grossly normal.  Neck: Supple, no JVD, carotid bruits, or masses. Cardiac: RRR, no murmurs, rubs, or gallops. No clubbing, cyanosis.  Trace  edema.  Radials/DP/PT 2+ and equal bilaterally.  Respiratory:  Respirations regular and unlabored, some wheezing GI: Soft, nontender, nondistended, BS + x 4. Abdomen appears firm MS: no deformity or atrophy. Skin: warm and dry, no rash. Neuro:  Strength and sensation are intact. Psych: AAOx3.  Normal affect.  Labs    CBC  Recent Labs  12/07/16 0337 12/08/16 0257  WBC 8.6 6.7  NEUTROABS 5.9 5.9  HGB 9.5* 9.5*  HCT 33.3* 32.6*  MCV 85.8 84.9  PLT 338 [id number]   Basic Metabolic Panel  Recent Labs  12/07/16 0337 12/08/16 0257  NA 140 138  K 4.2 3.9  CL 90* 87*  CO2 40* 42*  GLUCOSE 106* 253*  BUN 32* 31*  CREATININE 1.84* 1.70*  CALCIUM 9.2 8.3*  MG 2.2 2.1   Liver  Function Tests No results for input(s): AST, ALT, ALKPHOS, BILITOT, PROT, ALBUMIN in the last 72 hours. No results for input(s): LIPASE, AMYLASE in the last 72 hours. Cardiac Enzymes No results for input(s): CKTOTAL, CKMB, CKMBINDEX, TROPONINI in the last 72 hours. BNP Invalid input(s): POCBNP D-Dimer No results for input(s): DDIMER in the last 72 hours. Hemoglobin A1C No results for input(s): HGBA1C in the last 72 hours. Fasting Lipid Panel No results for input(s): CHOL, HDL, LDLCALC, TRIG, CHOLHDL, LDLDIRECT in the last 72 hours. Thyroid Function Tests No results for input(s): TSH, T4TOTAL, T3FREE, THYROIDAB in the last 72 hours.  Invalid input(s): FREET3  Telemetry    NSR - Personally Reviewed  ECG    NSR - Personally Reviewed  Radiology    No results found.  Cardiac Studies  2D echo 12/05/2016 Study Conclusions  - Left ventricle: The cavity size was normal. Wall thickness was   increased in a pattern of moderate LVH. The estimated ejection   fraction was 55%. Septal-lateral dyssynchrony. The septum appears   hypokinetic. Doppler parameters are consistent with abnormal left   ventricular relaxation (grade 1 diastolic dysfunction). - Aortic valve: There was no stenosis. - Mitral valve: There was no significant regurgitation. - Right ventricle: The cavity size was normal. Systolic function   was normal. - Pulmonary arteries: No complete TR doppler jet so unable to   estimate PA systolic pressure. - Systemic veins: IVC measured 2.6 cm with < 50% respirophasic   variation, suggesting RA pressure 15 mmHg.  Impressions:  - Technically difficult study with poor acoustic windows. Normal LV   size with moderate LV hypertrophy. EF 55%. Septal-lateral   dyssynchrony with septal hypokinesis. Normal RV size and systolic   function. No significant valvular abnormalities.  Patient Profile     71yo obese female with a history of chronic diastolic/systolic CHF with EF [id number]  (now EF 55% by recent echo), CKD stage 3, HTN, severe oxygen dependent COPD and morbid obesity who presented with increased SOB, hoarseness, nonproductive cough and declining oxygen saturations in the low 80s on day of admission.  On admission she initially required BiPAP.  CXRAY with bibasilar atelectasis but no frank edema but her BNp was mildly elevated.  She was given IV lasix and has diuresed 1.5L with improved creatinine.  She has not had any chest pain and no change in her chronic LE edema but has noticed some increase  in abdominal girth.  Unfortunately she does not weigh herself at home  [place]    1. Dyspnea/Acute on chronic diastolic HF?: Presented with multiple days of worsening dyspnea. Developed a fever prior to admission with nonproductive cough. CXR on admission showed bilateral atelectasis that has improved since admission. She was given IV lasix with 3.4L net neg UOP thus far. Diuresed 2.7L yesterday.  Echo showed slightly improved EF to 55% from 45% back in 9/17. Was given IV antibiotics on admission for sepsis and elevated WBC with some improvement. Reports her breathing is somewhat better but not at her baseline.  -- Does have crackles on exam, likely needs further diuresis with IV lasix but will need to monitor renal function as this has elevated since admission. Creatinine slowly improving with diuresis. Continue Lasix 80mg  IV BID. -- Questions whether her underlying COPD has worsened as she has not had follow up with pulmonary for quite some time. Recommend pulmonary evaluation.  2. Arrhythmia: Noted to have abnormal beats on telemetry.There was some concern about possible PAF on monitor but all telemetry strips revealed and no obvious PAF.  There is a lot of wandering baseline artifact.  Does have hx of OSA. Currently in a SR at the time of assessment.   3. HTN: Borderline controlled.  Amlodipine added yesterday. Titrate as needed.  Continue BB.    4. COPD: Reports  she was dx in 2007 and was followed by pulmonary for a time, but has not been seen for awhile. Normally wears 2L, but has noticed that she has been becoming more dyspnic at home, and does increase her O2 at times. Question whether this has worsened.   5. Anemia: Hgb 11.1 back in 9/17, now 9.5. Denies any active bleeding, could be contributing to dyspnea.   Signed, Fransico Him, MD  12/08/2016, 10:51 AM

## 2016-12-08 NOTE — Progress Notes (Signed)
Occupational Therapy Treatment Patient Details Name: Amanda Horton MRN: [id number] DOB: 03/01/1946 Today's Date: 12/08/2016    History of present illness Amanda Horton is a 71 y.o. female with medical history significant for COPD on 2 L oxygen at home, OSA, CHF , CKD stage 3, HTN, depression and anxiety. She presented to [facility] with worsening shortness of breath at rest and with exertion over past few days prior to this admission   OT comments  This 71 yo female admitted with above seen today with focus on increasing mobility and endurance but DOE/increased WOB still greatly limiting her ability to be mobile. She will continue to benefit from acute OT with follow up now changed to SNF. See PT note for vitals.  Follow Up Recommendations  SNF;Supervision/Assistance - 24 hour    Equipment Recommendations  None recommended by OT       Precautions / Restrictions Precautions Precautions: Fall Precaution Comments: monitor sats and HR Restrictions Weight Bearing Restrictions: No       Mobility Bed Mobility               General bed mobility comments: pt sitting OOB in recliner when PT entered room  Transfers Overall transfer level: Needs assistance Equipment used: Rolling walker (2 wheeled) Transfers: Sit to/from Stand Sit to Stand: Min guard         General transfer comment: pt demonstrated good hand placement, no physical assistance needed, min guard for safety    Balance Overall balance assessment: Needs assistance Sitting-balance support: No upper extremity supported;Feet supported Sitting balance-Leahy Scale: Good     Standing balance support: During functional activity Standing balance-Leahy Scale: Fair Standing balance comment: pt able to stand at sink to comb hair with min guard for safety                   ADL Overall ADL's : Needs assistance/impaired     Grooming: Wash/dry face;Brushing hair Grooming Details (indicate cue type and reason): Pt able  to stand with S for brushing hair, but then had to sit to wash face due to DOE                 Toilet Transfer: Min guard;Ambulation Toilet Transfer Details (indicate cue type and reason): bed>around and out door (see PT note for distance)                         Cognition   Behavior During Therapy: WFL for tasks assessed/performed Overall Cognitive Status: Within Functional Limits for tasks assessed                                    Pertinent Vitals/ Pain       Pain Assessment: No/denies pain         Frequency  Min 2X/week        Progress Toward Goals  OT Goals(current goals can now be found in the care plan section)  Progress towards OT goals: Progressing toward goals  Acute Rehab OT Goals Patient Stated Goal: to go home  Plan Discharge plan needs to be updated    Co-evaluation    PT/OT/SLP Co-Evaluation/Treatment: Yes Reason for Co-Treatment: For patient/therapist safety PT goals addressed during session: Mobility/safety with mobility;Balance;Proper use of DME;Strengthening/ROM OT goals addressed during session: ADL's and self-care;Strengthening/ROM      End of Session Equipment Utilized During Treatment: Oxygen;Rolling walker;Gait belt (  3 liters with ambulation, 2 liters at rest)   Activity Tolerance Patient limited by fatigue (also limited by DOE/increased WOB)   Patient Left in chair;with call bell/phone within reach;with chair alarm set   Nurse Communication          Time: [id number] OT Time Calculation (min): 42 min  Charges: OT General Charges $OT Visit: 1 Procedure OT Treatments $Self Care/Home Management : 8-22 mins  Almon Register [id number] 12/08/2016, 11:58 AM

## 2016-12-08 NOTE — Progress Notes (Signed)
[place] jelly is not available in Pharmacy or SPD.  Dressing changed using the old honeycomb dressing that was already on the wound.  Site is dry, no drainage noted.

## 2016-12-08 NOTE — Progress Notes (Signed)
Patient Demographics:    Amanda Horton, is a 71 y.o. female, DOB - 04-18-1946, ZDG:[id number]  Admit date - 12/03/2016   Admitting Physician Robbie Lis, MD  Outpatient Primary MD for the patient is Nicoletta Dress, MD  LOS - 5   Chief Complaint  Patient presents with  . Shortness of Breath        Subjective:    Amanda Horton today has no fevers, no emesis,  No chest pain,  Dyspnea on minimal exertion noted   Assessment  & Plan :    Principal Problem:   Acute on chronic respiratory failure with hypoxia (HCC) Active Problems:   GERD (gastroesophageal reflux disease)   OSA (obstructive sleep apnea)   Essential hypertension   COPD exacerbation (HCC)   Sepsis due to pneumonia (HCC)   Leukocytosis   Troponin level elevated   Chronic combined systolic and diastolic CHF (congestive heart failure) (HCC)   Acute renal failure superimposed on stage 3 chronic kidney disease (HCC)   Anemia of chronic kidney failure   Metabolic alkalosis   Anxiety and depression   Elevated troponin   Anemia of chronic kidney failure, stage 4 (severe) (HCC)   Venous stasis ulcer limited to breakdown of skin with varicose veins (HCC)   Stasis ulcer (HCC)   SOB (shortness of breath)   Interval history:  71 y.o.WF PMHx Depression and Anxiety COPD on 2 L oxygen at home, OSA, Combined chronic Diastolic and Systolic CHF (last 2 D ECHO in 08/2016 with EF 45% and grade 1 DD), CKD stage 3(baseline Cr 1.2 in 08/2016), HTN, HLD,  She presented to [facility] with worsening shortness of breath at rest and with exertion over past few days prior to this admission. She used inhaler and nebulizer at home but with no significant symptomatic relief. She reported associated cough productive of clear to whitish sputum. She also had more frequent awakenings at night time due to shortness of breath and cough. She also reported subjective  fevers at home. No chest pain, no palpations. She had some nausea but no vomiting. No abdominal pain, no diarrhea or constipation. She was seen in urgent care where herr oxygen saturation was in mid 80's and for this reason she was referred to ED for further evaluation.   ED Course:In ED, BP was 128/61, T max 101.90F, HR 106-151, RR 18-30, oxygen saturation 92% with BiPAP which has improved to 100%. At this time she is on 5 L [place] and her O2 sa is 95%. Blood work was notable for WBC count 14, hemoglobin 9.1, CO@ 34, Cr 1.67 (baseline in 08/2016 was 1.2), troponin was 0.16, normal lactic acid. BNP was 165. The 12 lead EKG showed atypical LBBB. CXR showed atelectasis but infection not excluded. She was given solumedrol 125 mg IV once and continuous nebs. She continues to have shortness of breath and wheezing. In addition, sepsis criteria met on admission with source likely pneumonia so azithromycin and rocephin started in ED.   1)Acute on chronic respiratory failure with hypoxia (HCC) / COPD with acute exacerbation ([place]) / Oxygen dependent - with ambulation for about 12-15 feet today on 3 L of oxygen O2 sats dropped down to 90%, continue steroids, bronchodilators, flutter valve, BiPAP daily at bedtime, continue  azithromycin   2)Sepsis due to pneumonia ([place]) /Leukocytosis- - Sepsis criteria met on admission with fever, tachycardia, tachypnea, hypoxia, leukocytosis and suspected respiratory infection, please see #1 above-  3)Chronic combined systolic and diastolic CHF (congestive heart failure) ([place])- shortness of breath and dyspnea on exertion persist, repeat echo from January 2018 with EF of 55%,  Net 5lb pound weight gain since admission, her fluid balance  is negative > 3 L since admission, cardiology consult appreciated  4)HTN- continue amlodipine  Code Status : Full   Disposition Plan  : SNF Vs home with home health  Consults  :  Cardiology   DVT Prophylaxis  :  Heparin [place]  Lab Results    Component Value Date   PLT 315 12/08/2016    Inpatient Medications  Scheduled Meds: . amLODipine  2.5 mg Oral Daily  . arformoterol  15 mcg Nebulization BID  . aspirin  81 mg Oral Daily  . azithromycin  500 mg Oral Daily  . budesonide (PULMICORT) nebulizer solution  0.5 mg Nebulization BID  . calcium-vitamin D  1 tablet Oral BID WC  . docusate sodium  100 mg Oral Daily  . escitalopram  20 mg Oral QHS  . famotidine  20 mg Oral Daily  . furosemide  80 mg Intravenous TID  . heparin subcutaneous  5,000 Units Subcutaneous Q8H  . ipratropium-albuterol  3 mL Nebulization TID  . linaclotide  145 mcg Oral QAC breakfast  . mouth rinse  15 mL Mouth Rinse BID  . methylPREDNISolone (SOLU-MEDROL) injection  40 mg Intravenous Q12H  . metoprolol tartrate  25 mg Oral BID  . pantoprazole  40 mg Oral Daily  . potassium chloride SA  20 mEq Oral BID  . sodium chloride flush  3 mL Intravenous Q12H  . cyanocobalamin  500 mcg Oral Daily  . Vitamin D (Ergocalciferol)  50,000 Units Oral Q7 days   Continuous Infusions: . sodium chloride 10 mL/hr at 12/03/16 2239   PRN Meds:.acetaminophen **OR** [DISCONTINUED] acetaminophen, guaiFENesin, levalbuterol, LORazepam, ondansetron **OR** ondansetron (ZOFRAN) IV, oxyCODONE-acetaminophen, temazepam    Anti-infectives    Start     Dose/Rate Route Frequency Ordered Stop   12/07/16 1600  azithromycin (ZITHROMAX) tablet 500 mg     500 mg Oral Daily 12/07/16 1257 12/09/16 2359   12/04/16 2000  cefTRIAXone (ROCEPHIN) 1 g in dextrose 5 % 50 mL IVPB  Status:  Discontinued     1 g 100 mL/hr over 30 Minutes Intravenous Every 24 hours 12/03/16 2010 12/08/16 1511   12/04/16 2000  azithromycin (ZITHROMAX) 500 mg in dextrose 5 % 250 mL IVPB  Status:  Discontinued     500 mg 250 mL/hr over 60 Minutes Intravenous Every 24 hours 12/03/16 2010 12/07/16 1256   12/03/16 2015  cefTRIAXone (ROCEPHIN) 1 g in dextrose 5 % 50 mL IVPB  Status:  Discontinued     1 g 100 mL/hr  over 30 Minutes Intravenous  Once 12/03/16 2000 12/03/16 2244   12/03/16 2015  azithromycin (ZITHROMAX) 500 mg in dextrose 5 % 250 mL IVPB  Status:  Discontinued     500 mg 250 mL/hr over 60 Minutes Intravenous  Once 12/03/16 2000 12/03/16 2244   12/03/16 1645  cefTRIAXone (ROCEPHIN) 1 g in dextrose 5 % 50 mL IVPB     1 g 100 mL/hr over 30 Minutes Intravenous  Once 12/03/16 1631 12/03/16 1756   12/03/16 1645  azithromycin (ZITHROMAX) 500 mg in dextrose 5 % 250 mL IVPB  500 mg 250 mL/hr over 60 Minutes Intravenous  Once 12/03/16 1631 12/03/16 2016        Objective:   Vitals:   12/08/16 0808 12/08/16 1143 12/08/16 1342 12/08/16 1548  BP: (!) 151/73 (!) 145/71  (!) 152/70  Pulse:      Resp:      Temp: 98.4 F (36.9 C) 98 F (36.7 C)  98.2 F (36.8 C)  TempSrc: Axillary Oral  Oral  SpO2:   100%   Weight:      Height:        Wt Readings from Last 3 Encounters:  12/08/16 111.9 kg (246 lb 12.8 oz)  09/26/16 108.9 kg (240 lb)  09/20/16 112.5 kg (248 lb)     Intake/Output Summary (Last 24 hours) at 12/08/16 1811 Last data filed at 12/08/16 1722  Gross per 24 hour  Intake              173 ml  Output             3650 ml  Net            -3477 ml     Physical Exam  Gen:- Awake Alert, Comfortable at rest,  morbidly obese, dyspnea on exertion  HEENT:- [place].AT, No sclera icterus Neck-Supple Neck,No JVD,.  Lungs-  CTAB , no significant adventitious sounds today CV- S1, S2 normal Abd-  +ve B.Sounds, Abd Soft, No tenderness,   increased truncal adiposity Extremity/Skin:- Venostasis/edema   Data Review:   Micro Results Recent Results (from the past 240 hour(s))  Urine culture     Status: Abnormal   Collection Time: 12/03/16  3:35 PM  Result Value Ref Range Status   Specimen Description URINE, RANDOM  Final   Special Requests NONE  Final   Culture MULTIPLE SPECIES PRESENT, SUGGEST RECOLLECTION (A)  Final   Report Status 12/05/2016 FINAL  Final  Blood Culture (routine x  2)     Status: None   Collection Time: 12/03/16  3:38 PM  Result Value Ref Range Status   Specimen Description BLOOD LEFT FOREARM  Final   Special Requests IN PEDIATRIC BOTTLE 2CC  Final   Culture NO GROWTH 5 DAYS  Final   Report Status 12/08/2016 FINAL  Final  Blood Culture (routine x 2)     Status: None   Collection Time: 12/03/16  3:45 PM  Result Value Ref Range Status   Specimen Description BLOOD RIGHT ANTECUBITAL  Final   Special Requests IN PEDIATRIC BOTTLE 2CC  Final   Culture NO GROWTH 5 DAYS  Final   Report Status 12/08/2016 FINAL  Final  MRSA PCR Screening     Status: None   Collection Time: 12/03/16  9:45 PM  Result Value Ref Range Status   MRSA by PCR NEGATIVE NEGATIVE Final    Comment:        The GeneXpert MRSA Assay (FDA approved for NASAL specimens only), is one component of a comprehensive MRSA colonization surveillance program. It is not intended to diagnose MRSA infection nor to guide or monitor treatment for MRSA infections.   Culture, sputum-assessment     Status: None   Collection Time: 12/03/16  9:58 PM  Result Value Ref Range Status   Specimen Description Expect. Sput  Final   Special Requests NONE  Final   Sputum evaluation THIS SPECIMEN IS ACCEPTABLE FOR SPUTUM CULTURE  Final   Report Status 12/05/2016 FINAL  Final  Culture, respiratory (NON-Expectorated)     Status: None  Collection Time: 12/03/16  9:58 PM  Result Value Ref Range Status   Specimen Description Expect. Sput  Final   Special Requests NONE Reflexed from [id number]  Final   Gram Stain   Final    FEW WBC PRESENT, PREDOMINANTLY PMN FEW SQUAMOUS EPITHELIAL CELLS PRESENT MODERATE GRAM POSITIVE COCCOBACILLUS FEW GRAM NEGATIVE COCCOBACILLI    Culture Consistent with normal respiratory flora.  Final   Report Status 12/07/2016 FINAL  Final  Respiratory Panel by PCR     Status: None   Collection Time: 12/04/16 12:21 PM  Result Value Ref Range Status   Adenovirus NOT DETECTED NOT DETECTED  Final   Coronavirus 229E NOT DETECTED NOT DETECTED Final   Coronavirus HKU1 NOT DETECTED NOT DETECTED Final   Coronavirus NL63 NOT DETECTED NOT DETECTED Final   Coronavirus OC43 NOT DETECTED NOT DETECTED Final   Metapneumovirus NOT DETECTED NOT DETECTED Final   Rhinovirus / Enterovirus NOT DETECTED NOT DETECTED Final   Influenza A NOT DETECTED NOT DETECTED Final   Influenza B NOT DETECTED NOT DETECTED Final   Parainfluenza Virus 1 NOT DETECTED NOT DETECTED Final   Parainfluenza Virus 2 NOT DETECTED NOT DETECTED Final   Parainfluenza Virus 3 NOT DETECTED NOT DETECTED Final   Parainfluenza Virus 4 NOT DETECTED NOT DETECTED Final   Respiratory Syncytial Virus NOT DETECTED NOT DETECTED Final   Bordetella pertussis NOT DETECTED NOT DETECTED Final   Chlamydophila pneumoniae NOT DETECTED NOT DETECTED Final   Mycoplasma pneumoniae NOT DETECTED NOT DETECTED Final    Radiology Reports Dg Chest Port 1 View  Result Date: 12/06/2016 CLINICAL DATA:  Shortness of breath and coughing. Symptoms are improving. EXAM: PORTABLE CHEST 1 VIEW COMPARISON:  12/03/2016 FINDINGS: Heart size is at the upper limits of normal. Mediastinal shadows are normal. Minimal basilar atelectasis persists, improving. No worsening or new findings. No effusions. No acute bone finding. IMPRESSION: Improving basilar atelectasis . Electronically Signed   By: Nelson Chimes M.D.   On: 12/06/2016 08:09   Dg Chest Port 1 View  Result Date: 12/03/2016 CLINICAL DATA:  Shortness of breath. EXAM: PORTABLE CHEST 1 VIEW COMPARISON:  Chest radiograph 09/26/2016. FINDINGS: Monitoring leads overlie the patient. Stable cardiomegaly. Heterogeneous opacities within the lung bases bilaterally. No pleural effusion or pneumothorax. IMPRESSION: Cardiomegaly. Bibasilar heterogeneous opacities favored to represent atelectasis. Infection not excluded. Electronically Signed   By: Lovey Newcomer M.D.   On: 12/03/2016 16:24     CBC  Recent Labs Lab  12/03/16 1545 12/04/16 0415 12/05/16 0302 12/06/16 0300 12/07/16 0337 12/08/16 0257  WBC 14.0* 11.0* 11.0* 8.2 8.6 6.7  HGB 9.1* 8.4* 8.3* 8.7* 9.5* 9.5*  HCT 31.7* 29.1* 28.0* 30.2* 33.3* 32.6*  PLT 272 258 274 300 338 315  MCV 84.3 83.9 83.3 84.8 85.8 84.9  MCH 24.2* 24.2* 24.7* 24.4* 24.5* 24.7*  MCHC 28.7* 28.9* 29.6* 28.8* 28.5* 29.1*  RDW 15.3 15.3 15.6* 15.3 15.2 15.0  LYMPHSABS 0.9  --   --  0.4* 1.4 0.3*  MONOABS 1.5*  --   --  0.4 1.3* 0.4  EOSABS 0.0  --   --  0.0 0.0 0.0  BASOSABS 0.0  --   --  0.0 0.0 0.0    Chemistries   Recent Labs Lab 12/03/16 1545 12/04/16 0415 12/05/16 0302 12/06/16 0300 12/07/16 0337 12/08/16 0257  NA 133* 137 135 139 140 138  K 3.5 3.7 3.9 4.7 4.2 3.9  CL 86* 88* 88* 93* 90* 87*  CO2 34* 36* 36* 44* 40*  42*  GLUCOSE 132* 154* 197* 185* 106* 253*  BUN 11 14 24* 32* 32* 31*  CREATININE 1.67* 1.75* 2.11* 1.91* 1.84* 1.70*  CALCIUM 8.7* 8.6* 8.4* 8.8* 9.2 8.3*  MG  --   --   --  2.2 2.2 2.1  AST '19 16 15  '$ --   --   --   ALT 9* 9* 9*  --   --   --   ALKPHOS 58 52 49  --   --   --   BILITOT 0.6 0.4 0.1*  --   --   --    ------------------------------------------------------------------------------------------------------------------ No results for input(s): CHOL, HDL, LDLCALC, TRIG, CHOLHDL, LDLDIRECT in the last 72 hours.  Lab Results  Component Value Date   HGBA1C 5.8 (H) 08/27/2016   ------------------------------------------------------------------------------------------------------------------ No results for input(s): TSH, T4TOTAL, T3FREE, THYROIDAB in the last 72 hours.  Invalid input(s): FREET3 ------------------------------------------------------------------------------------------------------------------ No results for input(s): VITAMINB12, FOLATE, FERRITIN, TIBC, IRON, RETICCTPCT in the last 72 hours.  Coagulation profile No results for input(s): INR, PROTIME in the last 168 hours.  No results for input(s):  DDIMER in the last 72 hours.  Cardiac Enzymes  Recent Labs Lab 12/03/16 2158 12/04/16 0415 12/04/16 1150  TROPONINI 0.09* 0.03* 0.09*   ------------------------------------------------------------------------------------------------------------------    Component Value Date/Time   BNP 165.1 (H) 12/03/2016 1545     Dayane Hillenburg M.D on 12/08/2016 at 6:11 PM  Between 7am to 7pm - Pager - [phone]  After 7pm go to [email or web address] - password [email or web address]  [facility] -  Office  [phone]  Dragon dictation system was used to create this note, attempts have been made to correct errors, however presence of uncorrected errors is not a reflection quality of care provided

## 2016-12-08 NOTE — Progress Notes (Addendum)
Name: Amanda Horton MRN: [id number] DOB: 1946/09/26    ADMISSION DATE:  12/03/2016 CONSULTATION DATE:  Ernst Breach  REFERRING MD :  Radford Pax (cards)   CHIEF COMPLAINT:  Dyspnea, ?AECOPD   BRIEF PATIENT DESCRIPTION: 71 yo former smoker with a 55 pack year smoking history female with hx OSA, COPD on 2L home O2 (previously followed by Dr. Joya Gaskins but lost to f/u), OSA, chronic combined CHF, CKD 3, HTN initially admitted 12/31 with acute on chronic respiratory failure thought r/t acute exacerbation of CHF.  Cardiology consulted and pt diuresed well, now NET neg but ongoing dyspnea so PCCM consulted for ?AECOPD.   SIGNIFICANT EVENTS    STUDIES:  Echo 1/2>>> Normal LV  size with moderate LV hypertrophy. EF 55%. Septal-lateral   dyssynchrony with septal hypokinesis. Normal RV size and systolic   function. No significant valvular abnormalities.   HISTORY OF PRESENT ILLNESS:   71yo female with hx COPD on 2L home O2, OSA, chronic combined CHF, CKD 3, HTN initially admitted 12/31 with acute on chronic respiratory failure thought r/t acute exacerbation of CHF.  Cardiology consulted and pt diuresed well, now NET neg but ongoing dyspnea so PCCM consulted for ?AECOPD.   Currently c/o dyspnea although somewhat improved, cough productive of thick sputum, ongoing orthopnea but also improved.      SUBJECTIVE:  Still SOB with scheduled BD. Continues to have Exp Wheezing. She is currently on 2.5 L [place]. Home O2 is 2L. She walked with PT, desaturation to 90% on 3L, returned to chair.  VITAL SIGNS: Temp:  [97.7 F (36.5 C)-98.7 F (37.1 C)] 98.4 F (36.9 C) (01/05 0808) Pulse Rate:  [72-95] 72 (01/05 0400) Resp:  [12-17] 12 (01/05 0400) BP: (130-153)/(60-76) 151/73 (01/05 0808) SpO2:  [95 %-100 %] 98 % (01/05 0800) Weight:  [246 lb 12.8 oz (111.9 kg)] 246 lb 12.8 oz (111.9 kg) (01/05 0355)  PHYSICAL EXAMINATION: General:  Pleasant, obese female, NAD in chair, working with PT. Neuro:  Awake, alert,  speaking in full sentences HEENT:  Mm moist  Cardiovascular:  s1s2 distant , 2+ LE edema Lungs:  resps even, mildly labored on 2.5 [place], exp wheeze throughout, bibasilar crackles  Abdomen:  Round, soft, mildly distended  Musculoskeletal:  Warm and dry, 1-2+ BLE edema   Recent Labs Lab 12/06/16 0300 12/07/16 0337 12/08/16 0257  NA 139 140 138  K 4.7 4.2 3.9  CL 93* 90* 87*  CO2 44* 40* 42*  BUN 32* 32* 31*  CREATININE 1.91* 1.84* 1.70*  GLUCOSE 185* 106* 253*    Recent Labs Lab 12/06/16 0300 12/07/16 0337 12/08/16 0257  HGB 8.7* 9.5* 9.5*  HCT 30.2* 33.3* 32.6*  WBC 8.2 8.6 6.7  PLT 300 338 315   No results found.  ASSESSMENT / PLAN:  Acute on chronic respiratory failure - r/t acute on chronic CHF, decompensated OSA and AECOPD.  OSA - previously wore bipap qhs but stopped because it "wasnt keeping my oxygen level up" AECOPD - on Home oxygen at 2 L  PLAN -  Solumedrol 60mg  IV q8h x 24 additional hours then wean to Q 12 Continue abx per primary- rocephin, aizthro  Ongoing diuresis per cards as renal function allows ( 1.70) Aggressive pulm hygiene/ mobilization  F/u CXR  Mucolytic  Scheduled and prn BD's  qhs bipap  Needs Outpt pulm f/u with PFT ' s / Sleep Study and to re-establish BiPAP use once acute issues resolved. Offer  Pulmicort and Brovana via nebulizer twice a day  on discharge, we can keep her on these neb meds or place her on Spiriva and LABA/ICS combination (Used  Dulera in past). Per patient preference.   Family : No family at bedside. Updated patient   Discussion: Discussed with patient the need to wear her BiPap at night while in the hospital. She refused to wear it last pm per RT. We discussed the increased risk of worsening CHF, Stroke, Pulmonary hypertension and the impact these all have on her ability to breath. Discussed need for follow up Sleep Study once discharged to make appropriate adjustments to BiPap settings. Additionally discussed the  need to be compliant with her medications and Neb treatments once discharged.She verbalized understanding.  Magdalen Spatz, AGACNP-BC [facility] 12/08/2016  11:05 AM Pager:  [phone]    STAFF NOTE: Linwood Dibbles, MD FACP have personally reviewed patient's available data, including medical history, events of note, physical examination and test results as part of my evaluation. I have discussed with resident/NP and other care providers such as pharmacist, RN and RRT. In addition, I personally evaluated patient and elicited key findings of: in chair, no distress, mild end exp wheezing anterior rt greater left, obese, erythema chronic stasis lowers with edema, neg balance successful daily, likely best thing that has improved status, no consistent history or radiographic evidence for infection, dc ceftriaxone, azithro x 1 more day, maintain lasix, reduce steroids over weekend to pred by Monday likley, will see again Monday, call if needed   Lavon Paganini. Titus Mould, MD, [place] Pgr: [facility] 12/08/2016 3:08 PM

## 2016-12-08 NOTE — Progress Notes (Signed)
Physical Therapy Treatment Patient Details Name: Amanda Horton MRN: [id number] DOB: Mar 09, 1946 Today's Date: 12/08/2016    History of Present Illness Amanda Horton is a 71 y.o. female with medical history significant for COPD on 2 L oxygen at home, OSA, CHF , CKD stage 3, HTN, depression and anxiety. She presented to [facility] with worsening shortness of breath at rest and with exertion over past few days prior to this admission    PT Comments    Pt presented sitting OOB in recliner when PT entered room. Pt seen for mobility progression in combination with OT therapist. Pt on 2L of O2 via [place] at rest but increased to 3L with activity. Pt's SPO2 maintained at >90% throughout session. Pt able to ambulate 20' x2 with RW and min guard for safety, sitting rest break in between bouts. During ambulation, pt's HR increased from 94 bpm to 130 bpm and quickly returned to mid 90's with sitting rest break. Also of note, at rest in sitting pt's HR fluctuated between mid 90's to as high as 151 bpm with monitor alarming V-tach. Pt's RN was notified. Pt with decreased cardiopulmonary endurance and fatigues quickly with activity; therefore she would greatly benefit from ST rehab at a SNF prior to d/c'ing home. Pt would continue to benefit from skilled physical therapy services at this time while admitted and after d/c to address her limitations in order to improve her overall safety and independence with functional mobility.    Follow Up Recommendations  SNF (pt agreeable to ST SNF before d/c'ing home)     Equipment Recommendations  None recommended by PT    Recommendations for Other Services       Precautions / Restrictions Precautions Precautions: Fall Precaution Comments: monitor sats and HR Restrictions Weight Bearing Restrictions: No    Mobility  Bed Mobility               General bed mobility comments: pt sitting OOB in recliner when PT entered room  Transfers Overall transfer level: Needs  assistance Equipment used: Rolling walker (2 wheeled) Transfers: Sit to/from Stand Sit to Stand: Min guard         General transfer comment: pt demonstrated good hand placement, no physical assistance needed, min guard for safety  Ambulation/Gait Ambulation/Gait assistance: Min guard Ambulation Distance (Feet): 20 Feet (20' x2 with sitting rest break in between) Assistive device: Rolling walker (2 wheeled) Gait Pattern/deviations: Step-through pattern;Decreased stride length Gait velocity: Decreased Gait velocity interpretation: Below normal speed for age/gender General Gait Details: pt demonstrated safety with use of RW. pt ambulated on 3L of O2 with SPO2 maintaining >90%   Stairs            Wheelchair Mobility    Modified Rankin (Stroke Patients Only)       Balance Overall balance assessment: Needs assistance Sitting-balance support: No upper extremity supported;Feet supported Sitting balance-Leahy Scale: Good     Standing balance support: During functional activity Standing balance-Leahy Scale: Fair Standing balance comment: pt able to stand at sink to comb hair with min guard for safety                    Cognition Arousal/Alertness: Awake/alert Behavior During Therapy: WFL for tasks assessed/performed Overall Cognitive Status: Within Functional Limits for tasks assessed                      Exercises      General Comments  Pertinent Vitals/Pain Pain Assessment: No/denies pain    Home Living                      Prior Function            PT Goals (current goals can now be found in the care plan section) Acute Rehab PT Goals Patient Stated Goal: to go home PT Goal Formulation: With patient Time For Goal Achievement: 12/11/16 Potential to Achieve Goals: Good Progress towards PT goals: Progressing toward goals    Frequency    Min 3X/week      PT Plan Current plan remains appropriate;Discharge plan needs  to be updated    Co-evaluation PT/OT/SLP Co-Evaluation/Treatment: Yes Reason for Co-Treatment: For patient/therapist safety;To address functional/ADL transfers PT goals addressed during session: Mobility/safety with mobility;Balance;Proper use of DME;Strengthening/ROM       End of Session Equipment Utilized During Treatment: Gait belt;Oxygen (2L of O2 at rest, 3L of O2 with activity) Activity Tolerance: Patient limited by fatigue Patient left: in chair;with call bell/phone within reach;with chair alarm set     Time: [id number] PT Time Calculation (min) (ACUTE ONLY): 39 min  Charges:  $Gait Training: 23-37 mins                    G CodesClearnce Sorrel Winslow Horton 01-06-17, 11:44 AM Amanda Horton, PT, DPT [phone]

## 2016-12-08 NOTE — Progress Notes (Signed)
Patient stated she didn't want to wear BIPAP at night, RT explained to patient importance of wearing BIPAP at night. Pt agreed to try it again but at a later time tonight. RT will return and attempt to place pt on BIPAP.

## 2016-12-09 ENCOUNTER — Inpatient Hospital Stay (HOSPITAL_COMMUNITY): Payer: Commercial Managed Care - HMO

## 2016-12-09 LAB — BASIC METABOLIC PANEL
ANION GAP: 13 (ref 5–15)
BUN: 29 mg/dL — ABNORMAL HIGH (ref 6–20)
CALCIUM: 8.3 mg/dL — AB (ref 8.9–10.3)
CHLORIDE: 85 mmol/L — AB (ref 101–111)
CO2: 41 mmol/L — AB (ref 22–32)
Creatinine, Ser: 1.58 mg/dL — ABNORMAL HIGH (ref 0.44–1.00)
GFR calc non Af Amer: 32 mL/min — ABNORMAL LOW (ref >60)
GFR, EST AFRICAN AMERICAN: 37 mL/min — AB (ref >60)
Glucose, Bld: 173 mg/dL — ABNORMAL HIGH (ref 65–99)
POTASSIUM: 4.2 mmol/L (ref 3.5–5.1)
Sodium: 139 mmol/L (ref 135–145)

## 2016-12-09 LAB — LEGIONELLA PNEUMOPHILA SEROGP 1 UR AG: L. pneumophila Serogp 1 Ur Ag: NEGATIVE

## 2016-12-09 LAB — GLUCOSE, CAPILLARY: Glucose-Capillary: 161 mg/dL — ABNORMAL HIGH (ref 65–99)

## 2016-12-09 NOTE — Progress Notes (Signed)
Patient Name: Amanda Horton Date of Encounter: 12/09/2016  Primary Cardiologist: Dr. Sundra Aland Problem List     Principal Problem:   Acute on chronic respiratory failure with hypoxia [facility]) Active Problems:   GERD (gastroesophageal reflux disease)   OSA (obstructive sleep apnea)   Essential hypertension   COPD exacerbation (HCC)   Sepsis due to pneumonia (HCC)   Leukocytosis   Troponin level elevated   Chronic combined systolic and diastolic CHF (congestive heart failure) (HCC)   Acute renal failure superimposed on stage 3 chronic kidney disease (HCC)   Anemia of chronic kidney failure   Metabolic alkalosis   Anxiety and depression   Elevated troponin   Anemia of chronic kidney failure, stage 4 (severe) (HCC)   Venous stasis ulcer limited to breakdown of skin with varicose veins (HCC)   Stasis ulcer (HCC)   Shortness of breath   Hypoxia     Subjective   Feels better today.  Diuresed 2.47L yesterday and net neg 6.2L.  Weight down 2lbs from yesterday and 11 lbs from admit (251>>246>>240lbs).  BMET pending.  Still SOB but improved  Inpatient Medications    Scheduled Meds: . amLODipine  2.5 mg Oral Daily  . arformoterol  15 mcg Nebulization BID  . aspirin  81 mg Oral Daily  . budesonide (PULMICORT) nebulizer solution  0.5 mg Nebulization BID  . calcium-vitamin D  1 tablet Oral BID WC  . docusate sodium  100 mg Oral Daily  . escitalopram  20 mg Oral QHS  . famotidine  20 mg Oral Daily  . furosemide  80 mg Intravenous TID  . heparin subcutaneous  5,000 Units Subcutaneous Q8H  . ipratropium-albuterol  3 mL Nebulization TID  . linaclotide  145 mcg Oral QAC breakfast  . mouth rinse  15 mL Mouth Rinse BID  . methylPREDNISolone (SOLU-MEDROL) injection  40 mg Intravenous Q12H  . metoprolol tartrate  25 mg Oral BID  . pantoprazole  40 mg Oral Daily  . potassium chloride SA  20 mEq Oral BID  . sodium chloride flush  3 mL Intravenous Q12H  . cyanocobalamin  500 mcg  Oral Daily  . Vitamin D (Ergocalciferol)  50,000 Units Oral Q7 days   Continuous Infusions: . sodium chloride 10 mL/hr at 12/03/16 2239   PRN Meds: acetaminophen **OR** [DISCONTINUED] acetaminophen, guaiFENesin, levalbuterol, LORazepam, ondansetron **OR** ondansetron (ZOFRAN) IV, oxyCODONE-acetaminophen, temazepam   Vital Signs    Vitals:   12/09/16 0800 12/09/16 0814 12/09/16 0938 12/09/16 0943  BP: (!) 162/78  114/66 114/66  Pulse: 74   92  Resp: 16     Temp: 98.3 F (36.8 C)     TempSrc: Oral     SpO2: 98% 97%    Weight:      Height:        Intake/Output Summary (Last 24 hours) at 12/09/16 1158 Last data filed at 12/09/16 1118  Gross per 24 hour  Intake              243 ml  Output             3020 ml  Net            -2777 ml   Filed Weights   12/07/16 0400 12/08/16 0355 12/09/16 0641  Weight: 248 lb 12.8 oz (112.9 kg) 246 lb 12.8 oz (111.9 kg) 240 lb 11.9 oz (109.2 kg)    Physical Exam    GEN: Well nourished, well developed, in no acute distress.  HEENT: Grossly normal.  Neck: Supple, no JVD, carotid bruits, or masses. Cardiac: RRR, no murmurs, rubs, or gallops. No clubbing, cyanosis.  Trace  edema.  Radials/DP/PT 2+ and equal bilaterally.  Respiratory:  Respirations regular and unlabored, some wheezing GI: Soft, nontender, nondistended, BS + x 4. Abdomen appears firm MS: no deformity or atrophy. Skin: warm and dry, no rash. Neuro:  Strength and sensation are intact. Psych: AAOx3.  Normal affect.  Labs    CBC  Recent Labs  12/07/16 0337 12/08/16 0257  WBC 8.6 6.7  NEUTROABS 5.9 5.9  HGB 9.5* 9.5*  HCT 33.3* 32.6*  MCV 85.8 84.9  PLT 338 [id number]   Basic Metabolic Panel  Recent Labs  12/07/16 0337 12/08/16 0257  NA 140 138  K 4.2 3.9  CL 90* 87*  CO2 40* 42*  GLUCOSE 106* 253*  BUN 32* 31*  CREATININE 1.84* 1.70*  CALCIUM 9.2 8.3*  MG 2.2 2.1   Liver Function Tests No results for input(s): AST, ALT, ALKPHOS, BILITOT, PROT, ALBUMIN in the  last 72 hours. No results for input(s): LIPASE, AMYLASE in the last 72 hours. Cardiac Enzymes No results for input(s): CKTOTAL, CKMB, CKMBINDEX, TROPONINI in the last 72 hours. BNP Invalid input(s): POCBNP D-Dimer No results for input(s): DDIMER in the last 72 hours. Hemoglobin A1C No results for input(s): HGBA1C in the last 72 hours. Fasting Lipid Panel No results for input(s): CHOL, HDL, LDLCALC, TRIG, CHOLHDL, LDLDIRECT in the last 72 hours. Thyroid Function Tests No results for input(s): TSH, T4TOTAL, T3FREE, THYROIDAB in the last 72 hours.  Invalid input(s): FREET3  Telemetry    NSR - Personally Reviewed  ECG    NSR - Personally Reviewed  Radiology    Dg Chest Port 1 View  Result Date: 12/09/2016 CLINICAL DATA:  Respiratory failure. Pt c/o of SOB, but denies chest pain. EXAM: PORTABLE CHEST 1 VIEW COMPARISON:  12/06/2016 FINDINGS: Stable mild cardiomegaly. No mediastinal or hilar masses. Mild linear atelectasis at the lung bases, also stable. Lungs otherwise clear. No convincing pleural effusion.  No pneumothorax. IMPRESSION: 1. No acute cardiopulmonary disease. 2. Mild persistent lung base atelectasis. Electronically Signed   By: Lajean Manes M.D.   On: 12/09/2016 07:36    Cardiac Studies  2D echo 12/05/2016 Study Conclusions  - Left ventricle: The cavity size was normal. Wall thickness was   increased in a pattern of moderate LVH. The estimated ejection   fraction was 55%. Septal-lateral dyssynchrony. The septum appears   hypokinetic. Doppler parameters are consistent with abnormal left   ventricular relaxation (grade 1 diastolic dysfunction). - Aortic valve: There was no stenosis. - Mitral valve: There was no significant regurgitation. - Right ventricle: The cavity size was normal. Systolic function   was normal. - Pulmonary arteries: No complete TR doppler jet so unable to   estimate PA systolic pressure. - Systemic veins: IVC measured 2.6 cm with < 50%  respirophasic   variation, suggesting RA pressure 15 mmHg.  Impressions:  - Technically difficult study with poor acoustic windows. Normal LV   size with moderate LV hypertrophy. EF 55%. Septal-lateral   dyssynchrony with septal hypokinesis. Normal RV size and systolic   function. No significant valvular abnormalities.  Patient Profile     71yo obese female with a history of chronic diastolic/systolic CHF with EF [id number] (now EF 55% by recent echo), CKD stage 3, HTN, severe oxygen dependent COPD and morbid obesity who presented with increased SOB, hoarseness, nonproductive cough and declining oxygen saturations  in the low 80s on day of admission.  On admission she initially required BiPAP.  CXRAY with bibasilar atelectasis but no frank edema but her BNp was mildly elevated.  She was given IV lasix and has diuresed 1.5L with improved creatinine.  She has not had any chest pain and no change in her chronic LE edema but has noticed some increase in abdominal girth.  Unfortunately she does not weigh herself at home  [place]    1. Dyspnea/Acute on chronic diastolic HF?: Presented with multiple days of worsening dyspnea. Developed a fever prior to admission with nonproductive cough. CXR on admission showed bilateral atelectasis that has improved since admission. She was given IV lasix with 6.2L net neg UOP thus far. Diuresed 2.47L yesterday.  Echo showed slightly improved EF to 55% from 45% back in 9/17. Was given IV antibiotics on admission for sepsis and elevated WBC with some improvement. Reports her breathing is somewhat better but not at her baseline.  -- Crackles in lungs scant and improved from yesterday.  Her abdomen is still firm and likely needs another day of diuresis with IV lasix but will need to monitor renal function.  Check BMET and if creatinine still trending downward will give another day of IV lasix.  Creatinine slowly improving with diuresis.  -- Questions whether her  underlying COPD has worsened as she has not had follow up with pulmonary for quite some time. Recommend pulmonary evaluation.  2. Arrhythmia: Noted to have abnormal beats on telemetry.There was some concern about possible PAF on monitor but all telemetry strips revealed and no obvious PAF.  There is a lot of wandering baseline artifact.  Does have hx of OSA. Currently in a SR at the time of assessment.   3. HTN: BP much improved and controlled.  Amlodipine added. Titrate as needed.  Continue BB.    4. COPD: Reports she was dx in 2007 and was followed by pulmonary for a time, but has not been seen for awhile. Normally wears 2L, but has noticed that she has been becoming more dyspnic at home, and does increase her O2 at times. Question whether this has worsened.   5. Anemia: Hgb 11.1 back in 9/17, now 9.5. Denies any active bleeding, could be contributing to dyspnea.   Signed, Fransico Him, MD  12/09/2016, 11:58 AM

## 2016-12-09 NOTE — Progress Notes (Signed)
Patient placed on BIPAP with 2 LPM bled in. RT made pt aware that if the pressures needed to be adjusted to call. Pt tolerating well at this time.

## 2016-12-09 NOTE — Progress Notes (Signed)
Patient Demographics:    Amanda Horton, is a 71 y.o. female, DOB - 1946-08-10, JKD:[id number]  Admit date - 12/03/2016   Admitting Physician Robbie Lis, MD  Outpatient Primary MD for the patient is Nicoletta Dress, MD  LOS - 6   Chief Complaint  Patient presents with  . Shortness of Breath        Subjective:    Amanda Horton today has no fevers, no emesis,  No chest pain,  Patient states that her exertion is better, down to 2 L on nasal cannula, she used BiPAP overnight for sleep   Assessment  & Plan :    Principal Problem:   Acute on chronic respiratory failure with hypoxia (HCC) Active Problems:   GERD (gastroesophageal reflux disease)   OSA (obstructive sleep apnea)   Essential hypertension   COPD exacerbation (HCC)   Sepsis due to pneumonia (HCC)   Leukocytosis   Troponin level elevated   Chronic combined systolic and diastolic CHF (congestive heart failure) (HCC)   Acute renal failure superimposed on stage 3 chronic kidney disease (HCC)   Anemia of chronic kidney failure   Metabolic alkalosis   Anxiety and depression   Elevated troponin   Anemia of chronic kidney failure, stage 4 (severe) (HCC)   Venous stasis ulcer limited to breakdown of skin with varicose veins (HCC)   Stasis ulcer (HCC)   Shortness of breath   Hypoxia   Interval history: 71 y.o.WFPMHxDepression and Anxiety COPD on2 L oxygen at home, OSA, Combined chronic Diastolic and Systolic CHF (last 2 D ECHO in 08/2016 with EF 45% and grade 1 DD), CKD stage 3(baseline Cr 1.2 in 08/2016), HTN, HLD,  She presented to [facility] with worsening shortness of breath at rest and with exertion over past few days prior to this admission. She used inhaler and nebulizer at home but with no significant symptomatic relief. She reported associated cough productive of clear to whitish sputum. She also had more frequent awakenings at  night time due to shortness of breath and cough. She also reported subjective fevers at home. No chest pain, no palpations. She had some nausea but no vomiting. No abdominal pain, no diarrhea or constipation. She was seen in urgent care where herr oxygen saturation was in mid 80's and for this reason she was referred to ED for further evaluation.   ED Course:In ED, BP was 128/61, T max 101.49F, HR 106-151, RR 18-30, oxygen saturation 92% with BiPAP which has improved to 100%. At this time she is on 5 L [place] and her O2 sa is 95%. Blood work was notable for WBC count 14, hemoglobin 9.1, CO@ 34, Cr 1.67 (baseline in 08/2016 was 1.2), troponin was 0.16, normal lactic acid. BNP was 165. The 12 lead EKG showed atypical LBBB. CXR showed atelectasis but infection not excluded. She was given solumedrol 125 mg IV once and continuous nebs. She continues to have shortness of breath and wheezing. In addition, sepsis criteria met on admission with source likely pneumonia so azithromycin and rocephin started in ED.    Plan:- 1)Acute on chronic respiratory failure with hypoxia ([place]) / COPD with acute exacerbation ([place]) / Oxygen dependent - overall continues to improve very slowly, continues to desat with ambulation with ambulation  continue steroids, bronchodilators, flutter valve, BiPAP daily at bedtime, continue azithromycin  2)Sepsis due to pneumonia (HCC) /Leukocytosis- - Sepsis criteria met on admission with fever, tachycardia, tachypnea, hypoxia, leukocytosis and suspected respiratory infection, please see #1 above-  3)Chronic combined systolic and diastolic CHF (congestive heart failure) ([place])- shortness of breath at rest as resolved, however, dyspnea on exertion persist, repeat echo from January 2018 with EF of 55%,  Net 5lb pound weight gain since admission, her fluid balance  is negative > 3 L since admission, cardiology consult appreciated  4)HTN- continue amlodipine  Code Status :  Full   Disposition Plan  : SNF for rehabilitation prior to returning home  Consults  :  Cardiology, physical therapy   DVT Prophylaxis  :  Heparin [place]  Lab Results  Component Value Date   PLT 315 12/08/2016    Inpatient Medications  Scheduled Meds: . amLODipine  2.5 mg Oral Daily  . arformoterol  15 mcg Nebulization BID  . aspirin  81 mg Oral Daily  . azithromycin  500 mg Oral Daily  . budesonide (PULMICORT) nebulizer solution  0.5 mg Nebulization BID  . calcium-vitamin D  1 tablet Oral BID WC  . docusate sodium  100 mg Oral Daily  . escitalopram  20 mg Oral QHS  . famotidine  20 mg Oral Daily  . furosemide  80 mg Intravenous TID  . heparin subcutaneous  5,000 Units Subcutaneous Q8H  . ipratropium-albuterol  3 mL Nebulization TID  . linaclotide  145 mcg Oral QAC breakfast  . mouth rinse  15 mL Mouth Rinse BID  . methylPREDNISolone (SOLU-MEDROL) injection  40 mg Intravenous Q12H  . metoprolol tartrate  25 mg Oral BID  . pantoprazole  40 mg Oral Daily  . potassium chloride SA  20 mEq Oral BID  . sodium chloride flush  3 mL Intravenous Q12H  . cyanocobalamin  500 mcg Oral Daily  . Vitamin D (Ergocalciferol)  50,000 Units Oral Q7 days   Continuous Infusions: . sodium chloride 10 mL/hr at 12/03/16 2239   PRN Meds:.acetaminophen **OR** [DISCONTINUED] acetaminophen, guaiFENesin, levalbuterol, LORazepam, ondansetron **OR** ondansetron (ZOFRAN) IV, oxyCODONE-acetaminophen, temazepam    Anti-infectives    Start     Dose/Rate Route Frequency Ordered Stop   12/07/16 1600  azithromycin (ZITHROMAX) tablet 500 mg     500 mg Oral Daily 12/07/16 1257 12/09/16 2359   12/04/16 2000  cefTRIAXone (ROCEPHIN) 1 g in dextrose 5 % 50 mL IVPB  Status:  Discontinued     1 g 100 mL/hr over 30 Minutes Intravenous Every 24 hours 12/03/16 2010 12/08/16 1511   12/04/16 2000  azithromycin (ZITHROMAX) 500 mg in dextrose 5 % 250 mL IVPB  Status:  Discontinued     500 mg 250 mL/hr over 60  Minutes Intravenous Every 24 hours 12/03/16 2010 12/07/16 1256   12/03/16 2015  cefTRIAXone (ROCEPHIN) 1 g in dextrose 5 % 50 mL IVPB  Status:  Discontinued     1 g 100 mL/hr over 30 Minutes Intravenous  Once 12/03/16 2000 12/03/16 2244   12/03/16 2015  azithromycin (ZITHROMAX) 500 mg in dextrose 5 % 250 mL IVPB  Status:  Discontinued     500 mg 250 mL/hr over 60 Minutes Intravenous  Once 12/03/16 2000 12/03/16 2244   12/03/16 1645  cefTRIAXone (ROCEPHIN) 1 g in dextrose 5 % 50 mL IVPB     1 g 100 mL/hr over 30 Minutes Intravenous  Once 12/03/16 1631 12/03/16 1756   12/03/16 1645  azithromycin (ZITHROMAX) 500 mg in dextrose 5 % 250 mL IVPB     500 mg 250 mL/hr over 60 Minutes Intravenous  Once 12/03/16 1631 12/03/16 2016        Objective:   Vitals:   12/09/16 0429 12/09/16 0641 12/09/16 0800 12/09/16 0814  BP: 132/78  (!) 162/78   Pulse: 73  74   Resp: 19  16   Temp: 97.9 F (36.6 C)  98.3 F (36.8 C)   TempSrc: Oral  Oral   SpO2: 90%  98% 97%  Weight:  109.2 kg (240 lb 11.9 oz)    Height:        Wt Readings from Last 3 Encounters:  12/09/16 109.2 kg (240 lb 11.9 oz)  09/26/16 108.9 kg (240 lb)  09/20/16 112.5 kg (248 lb)     Intake/Output Summary (Last 24 hours) at 12/09/16 0906 Last data filed at 12/09/16 3740  Gross per 24 hour  Intake                3 ml  Output             2370 ml  Net            -2367 ml     Physical Exam  Gen:- Awake Alert,  Morbidly obese, able to speak in complete sentences HEENT:- [place].AT, No sclera icterus , nasal cannula 2 L Neck-Supple Neck,No JVD,.  Lungs-  CTAB  CV- S1, S2 normal Abd-  +ve B.Sounds, Abd Soft, No tenderness,  increased truncal adiposity Extremity/Skin:- Chronic venostasis, improved edema       Data Review:   Micro Results Recent Results (from the past 240 hour(s))  Urine culture     Status: Abnormal   Collection Time: 12/03/16  3:35 PM  Result Value Ref Range Status   Specimen Description URINE, RANDOM   Final   Special Requests NONE  Final   Culture MULTIPLE SPECIES PRESENT, SUGGEST RECOLLECTION (A)  Final   Report Status 12/05/2016 FINAL  Final  Blood Culture (routine x 2)     Status: None   Collection Time: 12/03/16  3:38 PM  Result Value Ref Range Status   Specimen Description BLOOD LEFT FOREARM  Final   Special Requests IN PEDIATRIC BOTTLE 2CC  Final   Culture NO GROWTH 5 DAYS  Final   Report Status 12/08/2016 FINAL  Final  Blood Culture (routine x 2)     Status: None   Collection Time: 12/03/16  3:45 PM  Result Value Ref Range Status   Specimen Description BLOOD RIGHT ANTECUBITAL  Final   Special Requests IN PEDIATRIC BOTTLE 2CC  Final   Culture NO GROWTH 5 DAYS  Final   Report Status 12/08/2016 FINAL  Final  MRSA PCR Screening     Status: None   Collection Time: 12/03/16  9:45 PM  Result Value Ref Range Status   MRSA by PCR NEGATIVE NEGATIVE Final    Comment:        The GeneXpert MRSA Assay (FDA approved for NASAL specimens only), is one component of a comprehensive MRSA colonization surveillance program. It is not intended to diagnose MRSA infection nor to guide or monitor treatment for MRSA infections.   Culture, sputum-assessment     Status: None   Collection Time: 12/03/16  9:58 PM  Result Value Ref Range Status   Specimen Description Expect. Sput  Final   Special Requests NONE  Final   Sputum evaluation THIS SPECIMEN IS ACCEPTABLE FOR SPUTUM  CULTURE  Final   Report Status 12/05/2016 FINAL  Final  Culture, respiratory (NON-Expectorated)     Status: None   Collection Time: 12/03/16  9:58 PM  Result Value Ref Range Status   Specimen Description Expect. Sput  Final   Special Requests NONE Reflexed from [id number]  Final   Gram Stain   Final    FEW WBC PRESENT, PREDOMINANTLY PMN FEW SQUAMOUS EPITHELIAL CELLS PRESENT MODERATE GRAM POSITIVE COCCOBACILLUS FEW GRAM NEGATIVE COCCOBACILLI    Culture Consistent with normal respiratory flora.  Final   Report Status  12/07/2016 FINAL  Final  Respiratory Panel by PCR     Status: None   Collection Time: 12/04/16 12:21 PM  Result Value Ref Range Status   Adenovirus NOT DETECTED NOT DETECTED Final   Coronavirus 229E NOT DETECTED NOT DETECTED Final   Coronavirus HKU1 NOT DETECTED NOT DETECTED Final   Coronavirus NL63 NOT DETECTED NOT DETECTED Final   Coronavirus OC43 NOT DETECTED NOT DETECTED Final   Metapneumovirus NOT DETECTED NOT DETECTED Final   Rhinovirus / Enterovirus NOT DETECTED NOT DETECTED Final   Influenza A NOT DETECTED NOT DETECTED Final   Influenza B NOT DETECTED NOT DETECTED Final   Parainfluenza Virus 1 NOT DETECTED NOT DETECTED Final   Parainfluenza Virus 2 NOT DETECTED NOT DETECTED Final   Parainfluenza Virus 3 NOT DETECTED NOT DETECTED Final   Parainfluenza Virus 4 NOT DETECTED NOT DETECTED Final   Respiratory Syncytial Virus NOT DETECTED NOT DETECTED Final   Bordetella pertussis NOT DETECTED NOT DETECTED Final   Chlamydophila pneumoniae NOT DETECTED NOT DETECTED Final   Mycoplasma pneumoniae NOT DETECTED NOT DETECTED Final    Radiology Reports Dg Chest Port 1 View  Result Date: 12/09/2016 CLINICAL DATA:  Respiratory failure. Pt c/o of SOB, but denies chest pain. EXAM: PORTABLE CHEST 1 VIEW COMPARISON:  12/06/2016 FINDINGS: Stable mild cardiomegaly. No mediastinal or hilar masses. Mild linear atelectasis at the lung bases, also stable. Lungs otherwise clear. No convincing pleural effusion.  No pneumothorax. IMPRESSION: 1. No acute cardiopulmonary disease. 2. Mild persistent lung base atelectasis. Electronically Signed   By: Lajean Manes M.D.   On: 12/09/2016 07:36   Dg Chest Port 1 View  Result Date: 12/06/2016 CLINICAL DATA:  Shortness of breath and coughing. Symptoms are improving. EXAM: PORTABLE CHEST 1 VIEW COMPARISON:  12/03/2016 FINDINGS: Heart size is at the upper limits of normal. Mediastinal shadows are normal. Minimal basilar atelectasis persists, improving. No worsening or  new findings. No effusions. No acute bone finding. IMPRESSION: Improving basilar atelectasis . Electronically Signed   By: Nelson Chimes M.D.   On: 12/06/2016 08:09   Dg Chest Port 1 View  Result Date: 12/03/2016 CLINICAL DATA:  Shortness of breath. EXAM: PORTABLE CHEST 1 VIEW COMPARISON:  Chest radiograph 09/26/2016. FINDINGS: Monitoring leads overlie the patient. Stable cardiomegaly. Heterogeneous opacities within the lung bases bilaterally. No pleural effusion or pneumothorax. IMPRESSION: Cardiomegaly. Bibasilar heterogeneous opacities favored to represent atelectasis. Infection not excluded. Electronically Signed   By: Lovey Newcomer M.D.   On: 12/03/2016 16:24     CBC  Recent Labs Lab 12/03/16 1545 12/04/16 0415 12/05/16 0302 12/06/16 0300 12/07/16 0337 12/08/16 0257  WBC 14.0* 11.0* 11.0* 8.2 8.6 6.7  HGB 9.1* 8.4* 8.3* 8.7* 9.5* 9.5*  HCT 31.7* 29.1* 28.0* 30.2* 33.3* 32.6*  PLT 272 258 274 300 338 315  MCV 84.3 83.9 83.3 84.8 85.8 84.9  MCH 24.2* 24.2* 24.7* 24.4* 24.5* 24.7*  MCHC 28.7* 28.9* 29.6* 28.8* 28.5*  29.1*  RDW 15.3 15.3 15.6* 15.3 15.2 15.0  LYMPHSABS 0.9  --   --  0.4* 1.4 0.3*  MONOABS 1.5*  --   --  0.4 1.3* 0.4  EOSABS 0.0  --   --  0.0 0.0 0.0  BASOSABS 0.0  --   --  0.0 0.0 0.0    Chemistries   Recent Labs Lab 12/03/16 1545 12/04/16 0415 12/05/16 0302 12/06/16 0300 12/07/16 0337 12/08/16 0257  NA 133* 137 135 139 140 138  K 3.5 3.7 3.9 4.7 4.2 3.9  CL 86* 88* 88* 93* 90* 87*  CO2 34* 36* 36* 44* 40* 42*  GLUCOSE 132* 154* 197* 185* 106* 253*  BUN 11 14 24* 32* 32* 31*  CREATININE 1.67* 1.75* 2.11* 1.91* 1.84* 1.70*  CALCIUM 8.7* 8.6* 8.4* 8.8* 9.2 8.3*  MG  --   --   --  2.2 2.2 2.1  AST '19 16 15  '$ --   --   --   ALT 9* 9* 9*  --   --   --   ALKPHOS 58 52 49  --   --   --   BILITOT 0.6 0.4 0.1*  --   --   --    ------------------------------------------------------------------------------------------------------------------ No results for  input(s): CHOL, HDL, LDLCALC, TRIG, CHOLHDL, LDLDIRECT in the last 72 hours.  Lab Results  Component Value Date   HGBA1C 5.8 (H) 08/27/2016   ------------------------------------------------------------------------------------------------------------------ No results for input(s): TSH, T4TOTAL, T3FREE, THYROIDAB in the last 72 hours.  Invalid input(s): FREET3 ------------------------------------------------------------------------------------------------------------------ No results for input(s): VITAMINB12, FOLATE, FERRITIN, TIBC, IRON, RETICCTPCT in the last 72 hours.  Coagulation profile No results for input(s): INR, PROTIME in the last 168 hours.  No results for input(s): DDIMER in the last 72 hours.  Cardiac Enzymes  Recent Labs Lab 12/03/16 2158 12/04/16 0415 12/04/16 1150  TROPONINI 0.09* 0.03* 0.09*   ------------------------------------------------------------------------------------------------------------------    Component Value Date/Time   BNP 165.1 (H) 12/03/2016 1545     Joden Bonsall M.D on 12/09/2016 at 9:06 AM  Between 7am to 7pm - Pager - [phone]  After 7pm go to [email or web address] - password [email or web address]  [facility] -  Office  [phone]  Dragon dictation system was used to create this note, attempts have been made to correct errors, however presence of uncorrected errors is not a reflection quality of care provided

## 2016-12-10 LAB — GLUCOSE, CAPILLARY
Glucose-Capillary: 179 mg/dL — ABNORMAL HIGH (ref 65–99)
Glucose-Capillary: 186 mg/dL — ABNORMAL HIGH (ref 65–99)

## 2016-12-10 MED ORDER — FUROSEMIDE 80 MG PO TABS
80.0000 mg | ORAL_TABLET | Freq: Two times a day (BID) | ORAL | Status: DC
  Administered 2016-12-10 – 2016-12-11 (×2): 80 mg via ORAL
  Filled 2016-12-10 (×2): qty 1

## 2016-12-10 NOTE — Progress Notes (Signed)
Patient is refusing BIPAP use for the night. RT encouraged patient to let us know if she feels short of breath at any time. RT will continue to monitor as needed.

## 2016-12-10 NOTE — Progress Notes (Signed)
Patient Name: Amanda Horton Date of Encounter: 12/10/2016  Primary Cardiologist: Dr. Sundra Aland Problem List     Principal Problem:   Acute on chronic respiratory failure with hypoxia [facility]) Active Problems:   GERD (gastroesophageal reflux disease)   OSA (obstructive sleep apnea)   Essential hypertension   COPD exacerbation (HCC)   Sepsis due to pneumonia (HCC)   Leukocytosis   Troponin level elevated   Chronic combined systolic and diastolic CHF (congestive heart failure) (HCC)   Acute renal failure superimposed on stage 3 chronic kidney disease (HCC)   Anemia of chronic kidney failure   Metabolic alkalosis   Anxiety and depression   Elevated troponin   Anemia of chronic kidney failure, stage 4 (severe) (HCC)   Venous stasis ulcer limited to breakdown of skin with varicose veins (HCC)   Stasis ulcer (HCC)   Shortness of breath   Hypoxia     Subjective   Feels better today.  Diuresed 2.24L yesterday and net neg 7.4L.  Weight down 2lbs yesterday and 11 lbs from admit (251>>246>>240lbs) but no weight done this am.  BMET pending.  Still SOB but much improved and near her baseline.  Inpatient Medications    Scheduled Meds: . amLODipine  2.5 mg Oral Daily  . arformoterol  15 mcg Nebulization BID  . aspirin  81 mg Oral Daily  . budesonide (PULMICORT) nebulizer solution  0.5 mg Nebulization BID  . calcium-vitamin D  1 tablet Oral BID WC  . docusate sodium  100 mg Oral Daily  . escitalopram  20 mg Oral QHS  . famotidine  20 mg Oral Daily  . furosemide  80 mg Intravenous TID  . heparin subcutaneous  5,000 Units Subcutaneous Q8H  . ipratropium-albuterol  3 mL Nebulization TID  . linaclotide  145 mcg Oral QAC breakfast  . mouth rinse  15 mL Mouth Rinse BID  . methylPREDNISolone (SOLU-MEDROL) injection  40 mg Intravenous Q12H  . metoprolol tartrate  25 mg Oral BID  . pantoprazole  40 mg Oral Daily  . potassium chloride SA  20 mEq Oral BID  . sodium chloride flush  3  mL Intravenous Q12H  . cyanocobalamin  500 mcg Oral Daily  . Vitamin D (Ergocalciferol)  50,000 Units Oral Q7 days   Continuous Infusions: . sodium chloride 10 mL/hr at 12/03/16 2239   PRN Meds: acetaminophen **OR** [DISCONTINUED] acetaminophen, guaiFENesin, levalbuterol, LORazepam, ondansetron **OR** ondansetron (ZOFRAN) IV, oxyCODONE-acetaminophen, temazepam   Vital Signs    Vitals:   12/10/16 0820 12/10/16 0823 12/10/16 1154 12/10/16 1200  BP:    120/84  Pulse:    65  Resp:    13  Temp:   98.1 F (36.7 C)   TempSrc:   Oral   SpO2: 97% 99%  94%  Weight:      Height:        Intake/Output Summary (Last 24 hours) at 12/10/16 1400 Last data filed at 12/10/16 1000  Gross per 24 hour  Intake              720 ml  Output             1900 ml  Net            -1180 ml   Filed Weights   12/07/16 0400 12/08/16 0355 12/09/16 0641  Weight: 248 lb 12.8 oz (112.9 kg) 246 lb 12.8 oz (111.9 kg) 240 lb 11.9 oz (109.2 kg)    Physical Exam  GEN: Well nourished, well developed, in no acute distress.  HEENT: Grossly normal.  Neck: Supple, no JVD, carotid bruits, or masses. Cardiac: RRR, no murmurs, rubs, or gallops. No clubbing, cyanosis.  No edema.  Radials/DP/PT 2+ and equal bilaterally.  Respiratory:  Few scattered crackles GI: Soft, nontender, nondistended, BS + x 4. Abdomen soft MS: no deformity or atrophy. Skin: warm and dry, no rash. Neuro:  Strength and sensation are intact. Psych: AAOx3.  Normal affect.  Labs    CBC  Recent Labs  12/08/16 0257  WBC 6.7  NEUTROABS 5.9  HGB 9.5*  HCT 32.6*  MCV 84.9  PLT 123456   Basic Metabolic Panel  Recent Labs  12/08/16 0257 12/09/16 1410  NA 138 139  K 3.9 4.2  CL 87* 85*  CO2 42* 41*  GLUCOSE 253* 173*  BUN 31* 29*  CREATININE 1.70* 1.58*  CALCIUM 8.3* 8.3*  MG 2.1  --    Liver Function Tests No results for input(s): AST, ALT, ALKPHOS, BILITOT, PROT, ALBUMIN in the last 72 hours. No results for input(s): LIPASE,  AMYLASE in the last 72 hours. Cardiac Enzymes No results for input(s): CKTOTAL, CKMB, CKMBINDEX, TROPONINI in the last 72 hours. BNP Invalid input(s): POCBNP D-Dimer No results for input(s): DDIMER in the last 72 hours. Hemoglobin A1C No results for input(s): HGBA1C in the last 72 hours. Fasting Lipid Panel No results for input(s): CHOL, HDL, LDLCALC, TRIG, CHOLHDL, LDLDIRECT in the last 72 hours. Thyroid Function Tests No results for input(s): TSH, T4TOTAL, T3FREE, THYROIDAB in the last 72 hours.  Invalid input(s): FREET3  Telemetry    NSR - Personally Reviewed  ECG    NSR - Personally Reviewed  Radiology    Dg Chest Port 1 View  Result Date: 12/09/2016 CLINICAL DATA:  Respiratory failure. Pt c/o of SOB, but denies chest pain. EXAM: PORTABLE CHEST 1 VIEW COMPARISON:  12/06/2016 FINDINGS: Stable mild cardiomegaly. No mediastinal or hilar masses. Mild linear atelectasis at the lung bases, also stable. Lungs otherwise clear. No convincing pleural effusion.  No pneumothorax. IMPRESSION: 1. No acute cardiopulmonary disease. 2. Mild persistent lung base atelectasis. Electronically Signed   By: Lajean Manes M.D.   On: 12/09/2016 07:36    Cardiac Studies  2D echo 12/05/2016 Study Conclusions  - Left ventricle: The cavity size was normal. Wall thickness was   increased in a pattern of moderate LVH. The estimated ejection   fraction was 55%. Septal-lateral dyssynchrony. The septum appears   hypokinetic. Doppler parameters are consistent with abnormal left   ventricular relaxation (grade 1 diastolic dysfunction). - Aortic valve: There was no stenosis. - Mitral valve: There was no significant regurgitation. - Right ventricle: The cavity size was normal. Systolic function   was normal. - Pulmonary arteries: No complete TR doppler jet so unable to   estimate PA systolic pressure. - Systemic veins: IVC measured 2.6 cm with < 50% respirophasic   variation, suggesting RA pressure 15  mmHg.  Impressions:  - Technically difficult study with poor acoustic windows. Normal LV   size with moderate LV hypertrophy. EF 55%. Septal-lateral   dyssynchrony with septal hypokinesis. Normal RV size and systolic   function. No significant valvular abnormalities.  Patient Profile     71yo obese female with a history of chronic diastolic/systolic CHF with EF [id number] (now EF 55% by recent echo), CKD stage 3, HTN, severe oxygen dependent COPD and morbid obesity who presented with increased SOB, hoarseness, nonproductive cough and declining oxygen saturations in  the low 80s on day of admission.  On admission she initially required BiPAP.  CXRAY with bibasilar atelectasis but no frank edema but her BNp was mildly elevated.  She was given IV lasix and has diuresed 1.5L with improved creatinine.  She has not had any chest pain and no change in her chronic LE edema but has noticed some increase in abdominal girth.  Unfortunately she does not weigh herself at home  [place]    1. Dyspnea/Acute on chronic diastolic HF?: Presented with multiple days of worsening dyspnea. Developed a fever prior to admission with nonproductive cough. CXR on admission showed bilateral atelectasis that has improved since admission. She was given IV lasix with 7.4L net neg UOP thus far. Diuresed 2.24L yesterday.  Echo showed slightly improved EF to 55% from 45% back in 9/17. Was given IV antibiotics on admission for sepsis and elevated WBC with some improvement. Reports her breathing is somewhat better but not at her baseline.  -- Lungs have scant crackle and her abdomen is less firm.  No LE edema.  Renal function is stable.  I think she is nearing euvolemia.  Will change to PO Lasix 80mg  BID. Check BMET in am -- Questions whether her underlying COPD has worsened as she has not had follow up with pulmonary for quite some time. Recommend pulmonary evaluation.  2. Arrhythmia: Noted to have abnormal beats on  telemetry.There was some concern about possible PAF on monitor but all telemetry strips revealed and no obvious PAF.  There is a lot of wandering baseline artifact.  Does have hx of OSA. Currently in a SR on tele with a few episodes of nonsustained atrial tachycardia.   3. HTN: BP much improved and controlled.  Amlodipine added. Titrate as needed.  Continue BB.    4. COPD: Reports she was dx in 2007 and was followed by pulmonary for a time, but has not been seen for awhile. Normally wears 2L, but has noticed that she has been becoming more dyspnic at home, and does increase her O2 at times. Question whether this has worsened.   5. Anemia: Hgb 11.1 back in 9/17, now 9.5. Denies any active bleeding, could be contributing to dyspnea.   Signed, Fransico Him, MD  12/10/2016, 2:00 PM

## 2016-12-10 NOTE — Progress Notes (Signed)
Patient Demographics:    Amanda Horton, is a 71 y.o. female, DOB - 04-21-1946, OTL:[id number]  Admit date - 12/03/2016   Admitting Physician Robbie Lis, MD  Outpatient Primary MD for the patient is Nicoletta Dress, MD  LOS - 7   Chief Complaint  Patient presents with  . Shortness of Breath        Subjective:    Amanda Horton today has no fevers, no emesis,  No chest pain,  Dyspnea on exertion, no fevers or productive cough   Assessment  & Plan :    Principal Problem:   Acute on chronic respiratory failure with hypoxia (HCC) Active Problems:   GERD (gastroesophageal reflux disease)   OSA (obstructive sleep apnea)   Essential hypertension   COPD exacerbation (HCC)   Sepsis due to pneumonia (HCC)   Leukocytosis   Troponin level elevated   Chronic combined systolic and diastolic CHF (congestive heart failure) (HCC)   Acute renal failure superimposed on stage 3 chronic kidney disease (HCC)   Anemia of chronic kidney failure   Metabolic alkalosis   Anxiety and depression   Elevated troponin   Anemia of chronic kidney failure, stage 4 (severe) (HCC)   Venous stasis ulcer limited to breakdown of skin with varicose veins (HCC)   Stasis ulcer (HCC)   Shortness of breath   Hypoxia  Interval history: 71 y.o.WFPMHxDepression and Anxiety COPD on2 L oxygen at home, OSA, Combined chronic Diastolic and Systolic CHF (last 2 D ECHO in 08/2016 with EF 45% and grade 1 DD), CKD stage 3(baseline Cr 1.2 in 08/2016), HTN, HLD,  She presented to [facility] with worsening shortness of breath at rest and with exertion over past few days prior to this admission. She used inhaler and nebulizer at home but with no significant symptomatic relief. She reported associated cough productive of clear to whitish sputum. She also had more frequent awakenings at night time due to shortness of breath and cough. She also  reported subjective fevers at home. No chest pain, no palpations. She had some nausea but no vomiting. No abdominal pain, no diarrhea or constipation. She was seen in urgent care where herr oxygen saturation was in mid 80's and for this reason she was referred to ED for further evaluation.   ED Course:In ED, BP was 128/61, T max 101.39F, HR 106-151, RR 18-30, oxygen saturation 92% with BiPAP which has improved to 100%. At this time she is on 5 L [place] and her O2 sa is 95%. Blood work was notable for WBC count 14, hemoglobin 9.1, CO@ 34, Cr 1.67 (baseline in 08/2016 was 1.2), troponin was 0.16, normal lactic acid. BNP was 165. The 12 lead EKG showed atypical LBBB. CXR showed atelectasis but infection not excluded. She was given solumedrol 125 mg IV once and continuous nebs. She continues to have shortness of breath and wheezing. In addition, sepsis criteria met on admission with source likely pneumonia so azithromycin and rocephin started in ED.    Plan:- 1)Acute on chronic respiratory failure with hypoxia ([place]) / COPD with acute exacerbation ([place]) / Oxygen dependent - overall continues to improve very slowly, continues to desat with ambulation with ambulation  continue steroids, bronchodilators, flutter valve, BiPAP daily at bedtime, continue azithromycin  2)Sepsis due to pneumonia ([place]) /Leukocytosis- - Sepsis criteria met on admission with fever, tachycardia, tachypnea, hypoxia, leukocytosis and suspected respiratory infection, please see #1 above-  3)Chronic combined systolic and diastolic CHF (congestive heart failure) ([place])- shortness of breath at rest has resolved, however, dyspnea on exertion persist, repeat echo from January 2018 with EF of 55% (improved from 45%), Net > 11 lb wt loss since admission ((251>>246>>240lbs), her fluid balance is negative >7 L since admission,cardiology consult appreciated, change Lasix to 80 mg by mouth twice a day  4)HTN- stable, continue metoprolol 25 mg  twice a day and amlodipine 2.5 mg daily  Code Status:Full   Disposition Plan:SNF for rehabilitation prior to returning home  Consults :Cardiology, physical therapy   DVT Prophylaxis: Heparin [place]    Lab Results  Component Value Date   PLT 315 12/08/2016    Inpatient Medications  Scheduled Meds: . amLODipine  2.5 mg Oral Daily  . arformoterol  15 mcg Nebulization BID  . aspirin  81 mg Oral Daily  . budesonide (PULMICORT) nebulizer solution  0.5 mg Nebulization BID  . calcium-vitamin D  1 tablet Oral BID WC  . docusate sodium  100 mg Oral Daily  . escitalopram  20 mg Oral QHS  . famotidine  20 mg Oral Daily  . furosemide  80 mg Oral BID  . heparin subcutaneous  5,000 Units Subcutaneous Q8H  . ipratropium-albuterol  3 mL Nebulization TID  . linaclotide  145 mcg Oral QAC breakfast  . mouth rinse  15 mL Mouth Rinse BID  . methylPREDNISolone (SOLU-MEDROL) injection  40 mg Intravenous Q12H  . metoprolol tartrate  25 mg Oral BID  . pantoprazole  40 mg Oral Daily  . potassium chloride SA  20 mEq Oral BID  . sodium chloride flush  3 mL Intravenous Q12H  . cyanocobalamin  500 mcg Oral Daily  . Vitamin D (Ergocalciferol)  50,000 Units Oral Q7 days   Continuous Infusions: . sodium chloride 10 mL/hr at 12/03/16 2239   PRN Meds:.acetaminophen **OR** [DISCONTINUED] acetaminophen, guaiFENesin, levalbuterol, LORazepam, ondansetron **OR** ondansetron (ZOFRAN) IV, oxyCODONE-acetaminophen, temazepam    Anti-infectives    Start     Dose/Rate Route Frequency Ordered Stop   12/07/16 1600  azithromycin (ZITHROMAX) tablet 500 mg     500 mg Oral Daily 12/07/16 1257 12/09/16 0940   12/04/16 2000  cefTRIAXone (ROCEPHIN) 1 g in dextrose 5 % 50 mL IVPB  Status:  Discontinued     1 g 100 mL/hr over 30 Minutes Intravenous Every 24 hours 12/03/16 2010 12/08/16 1511   12/04/16 2000  azithromycin (ZITHROMAX) 500 mg in dextrose 5 % 250 mL IVPB  Status:  Discontinued     500  mg 250 mL/hr over 60 Minutes Intravenous Every 24 hours 12/03/16 2010 12/07/16 1256   12/03/16 2015  cefTRIAXone (ROCEPHIN) 1 g in dextrose 5 % 50 mL IVPB  Status:  Discontinued     1 g 100 mL/hr over 30 Minutes Intravenous  Once 12/03/16 2000 12/03/16 2244   12/03/16 2015  azithromycin (ZITHROMAX) 500 mg in dextrose 5 % 250 mL IVPB  Status:  Discontinued     500 mg 250 mL/hr over 60 Minutes Intravenous  Once 12/03/16 2000 12/03/16 2244   12/03/16 1645  cefTRIAXone (ROCEPHIN) 1 g in dextrose 5 % 50 mL IVPB     1 g 100 mL/hr over 30 Minutes Intravenous  Once 12/03/16 1631 12/03/16 1756   12/03/16 1645  azithromycin (ZITHROMAX) 500 mg in dextrose  5 % 250 mL IVPB     500 mg 250 mL/hr over 60 Minutes Intravenous  Once 12/03/16 1631 12/03/16 2016        Objective:   Vitals:   12/10/16 0823 12/10/16 1154 12/10/16 1200 12/10/16 1416  BP:   120/84   Pulse:   65   Resp:   13   Temp:  98.1 F (36.7 C)    TempSrc:  Oral    SpO2: 99%  94% 92%  Weight:      Height:        Wt Readings from Last 3 Encounters:  12/09/16 109.2 kg (240 lb 11.9 oz)  09/26/16 108.9 kg (240 lb)  09/20/16 112.5 kg (248 lb)     Intake/Output Summary (Last 24 hours) at 12/10/16 1615 Last data filed at 12/10/16 1000  Gross per 24 hour  Intake              480 ml  Output             1900 ml  Net            -1420 ml     Physical Exam  Gen:- Awake Alert,  Morbidly obese, resting comfortably, able to speak in complete sentences HEENT:- [place].AT, No sclera icterus, [place] at 2 L/m Neck-Supple Neck,No JVD,.  Lungs-  faint bibasilar rales CV- S1, S2 normal Abd-  +ve B.Sounds, Abd Soft, No tenderness, increased truncal adiposity   Extremity/Skin:- Chronic venostasis, improved edema   Data Review:   Micro Results Recent Results (from the past 240 hour(s))  Urine culture     Status: Abnormal   Collection Time: 12/03/16  3:35 PM  Result Value Ref Range Status   Specimen Description URINE, RANDOM  Final    Special Requests NONE  Final   Culture MULTIPLE SPECIES PRESENT, SUGGEST RECOLLECTION (A)  Final   Report Status 12/05/2016 FINAL  Final  Blood Culture (routine x 2)     Status: None   Collection Time: 12/03/16  3:38 PM  Result Value Ref Range Status   Specimen Description BLOOD LEFT FOREARM  Final   Special Requests IN PEDIATRIC BOTTLE 2CC  Final   Culture NO GROWTH 5 DAYS  Final   Report Status 12/08/2016 FINAL  Final  Blood Culture (routine x 2)     Status: None   Collection Time: 12/03/16  3:45 PM  Result Value Ref Range Status   Specimen Description BLOOD RIGHT ANTECUBITAL  Final   Special Requests IN PEDIATRIC BOTTLE 2CC  Final   Culture NO GROWTH 5 DAYS  Final   Report Status 12/08/2016 FINAL  Final  MRSA PCR Screening     Status: None   Collection Time: 12/03/16  9:45 PM  Result Value Ref Range Status   MRSA by PCR NEGATIVE NEGATIVE Final    Comment:        The GeneXpert MRSA Assay (FDA approved for NASAL specimens only), is one component of a comprehensive MRSA colonization surveillance program. It is not intended to diagnose MRSA infection nor to guide or monitor treatment for MRSA infections.   Culture, sputum-assessment     Status: None   Collection Time: 12/03/16  9:58 PM  Result Value Ref Range Status   Specimen Description Expect. Sput  Final   Special Requests NONE  Final   Sputum evaluation THIS SPECIMEN IS ACCEPTABLE FOR SPUTUM CULTURE  Final   Report Status 12/05/2016 FINAL  Final  Culture, respiratory (NON-Expectorated)  Status: None   Collection Time: 12/03/16  9:58 PM  Result Value Ref Range Status   Specimen Description Expect. Sput  Final   Special Requests NONE Reflexed from [id number]  Final   Gram Stain   Final    FEW WBC PRESENT, PREDOMINANTLY PMN FEW SQUAMOUS EPITHELIAL CELLS PRESENT MODERATE GRAM POSITIVE COCCOBACILLUS FEW GRAM NEGATIVE COCCOBACILLI    Culture Consistent with normal respiratory flora.  Final   Report Status 12/07/2016  FINAL  Final  Respiratory Panel by PCR     Status: None   Collection Time: 12/04/16 12:21 PM  Result Value Ref Range Status   Adenovirus NOT DETECTED NOT DETECTED Final   Coronavirus 229E NOT DETECTED NOT DETECTED Final   Coronavirus HKU1 NOT DETECTED NOT DETECTED Final   Coronavirus NL63 NOT DETECTED NOT DETECTED Final   Coronavirus OC43 NOT DETECTED NOT DETECTED Final   Metapneumovirus NOT DETECTED NOT DETECTED Final   Rhinovirus / Enterovirus NOT DETECTED NOT DETECTED Final   Influenza A NOT DETECTED NOT DETECTED Final   Influenza B NOT DETECTED NOT DETECTED Final   Parainfluenza Virus 1 NOT DETECTED NOT DETECTED Final   Parainfluenza Virus 2 NOT DETECTED NOT DETECTED Final   Parainfluenza Virus 3 NOT DETECTED NOT DETECTED Final   Parainfluenza Virus 4 NOT DETECTED NOT DETECTED Final   Respiratory Syncytial Virus NOT DETECTED NOT DETECTED Final   Bordetella pertussis NOT DETECTED NOT DETECTED Final   Chlamydophila pneumoniae NOT DETECTED NOT DETECTED Final   Mycoplasma pneumoniae NOT DETECTED NOT DETECTED Final    Radiology Reports Dg Chest Port 1 View  Result Date: 12/09/2016 CLINICAL DATA:  Respiratory failure. Pt c/o of SOB, but denies chest pain. EXAM: PORTABLE CHEST 1 VIEW COMPARISON:  12/06/2016 FINDINGS: Stable mild cardiomegaly. No mediastinal or hilar masses. Mild linear atelectasis at the lung bases, also stable. Lungs otherwise clear. No convincing pleural effusion.  No pneumothorax. IMPRESSION: 1. No acute cardiopulmonary disease. 2. Mild persistent lung base atelectasis. Electronically Signed   By: Lajean Manes M.D.   On: 12/09/2016 07:36   Dg Chest Port 1 View  Result Date: 12/06/2016 CLINICAL DATA:  Shortness of breath and coughing. Symptoms are improving. EXAM: PORTABLE CHEST 1 VIEW COMPARISON:  12/03/2016 FINDINGS: Heart size is at the upper limits of normal. Mediastinal shadows are normal. Minimal basilar atelectasis persists, improving. No worsening or new findings.  No effusions. No acute bone finding. IMPRESSION: Improving basilar atelectasis . Electronically Signed   By: Nelson Chimes M.D.   On: 12/06/2016 08:09   Dg Chest Port 1 View  Result Date: 12/03/2016 CLINICAL DATA:  Shortness of breath. EXAM: PORTABLE CHEST 1 VIEW COMPARISON:  Chest radiograph 09/26/2016. FINDINGS: Monitoring leads overlie the patient. Stable cardiomegaly. Heterogeneous opacities within the lung bases bilaterally. No pleural effusion or pneumothorax. IMPRESSION: Cardiomegaly. Bibasilar heterogeneous opacities favored to represent atelectasis. Infection not excluded. Electronically Signed   By: Lovey Newcomer M.D.   On: 12/03/2016 16:24     CBC  Recent Labs Lab 12/04/16 0415 12/05/16 0302 12/06/16 0300 12/07/16 0337 12/08/16 0257  WBC 11.0* 11.0* 8.2 8.6 6.7  HGB 8.4* 8.3* 8.7* 9.5* 9.5*  HCT 29.1* 28.0* 30.2* 33.3* 32.6*  PLT 258 274 300 338 315  MCV 83.9 83.3 84.8 85.8 84.9  MCH 24.2* 24.7* 24.4* 24.5* 24.7*  MCHC 28.9* 29.6* 28.8* 28.5* 29.1*  RDW 15.3 15.6* 15.3 15.2 15.0  LYMPHSABS  --   --  0.4* 1.4 0.3*  MONOABS  --   --  0.4  1.3* 0.4  EOSABS  --   --  0.0 0.0 0.0  BASOSABS  --   --  0.0 0.0 0.0    Chemistries   Recent Labs Lab 12/04/16 0415 12/05/16 0302 12/06/16 0300 12/07/16 0337 12/08/16 0257 12/09/16 1410  NA 137 135 139 140 138 139  K 3.7 3.9 4.7 4.2 3.9 4.2  CL 88* 88* 93* 90* 87* 85*  CO2 36* 36* 44* 40* 42* 41*  GLUCOSE 154* 197* 185* 106* 253* 173*  BUN 14 24* 32* 32* 31* 29*  CREATININE 1.75* 2.11* 1.91* 1.84* 1.70* 1.58*  CALCIUM 8.6* 8.4* 8.8* 9.2 8.3* 8.3*  MG  --   --  2.2 2.2 2.1  --   AST 16 15  --   --   --   --   ALT 9* 9*  --   --   --   --   ALKPHOS 52 49  --   --   --   --   BILITOT 0.4 0.1*  --   --   --   --    ------------------------------------------------------------------------------------------------------------------ No results for input(s): CHOL, HDL, LDLCALC, TRIG, CHOLHDL, LDLDIRECT in the last 72  hours.  Lab Results  Component Value Date   HGBA1C 5.8 (H) 08/27/2016   ------------------------------------------------------------------------------------------------------------------ No results for input(s): TSH, T4TOTAL, T3FREE, THYROIDAB in the last 72 hours.  Invalid input(s): FREET3 ------------------------------------------------------------------------------------------------------------------ No results for input(s): VITAMINB12, FOLATE, FERRITIN, TIBC, IRON, RETICCTPCT in the last 72 hours.  Coagulation profile No results for input(s): INR, PROTIME in the last 168 hours.  No results for input(s): DDIMER in the last 72 hours.  Cardiac Enzymes  Recent Labs Lab 12/03/16 2158 12/04/16 0415 12/04/16 1150  TROPONINI 0.09* 0.03* 0.09*   ------------------------------------------------------------------------------------------------------------------    Component Value Date/Time   BNP 165.1 (H) 12/03/2016 1545     Kymber Kosar M.D on 12/10/2016 at 4:15 PM  Between 7am to 7pm - Pager - [phone]  After 7pm go to [email or web address] - password [email or web address]  [facility] -  Office  [phone]  Dragon dictation system was used to create this note, attempts have been made to correct errors, however presence of uncorrected errors is not a reflection quality of care provided

## 2016-12-11 DIAGNOSIS — I471 Supraventricular tachycardia: Secondary | ICD-10-CM

## 2016-12-11 LAB — BASIC METABOLIC PANEL
Anion gap: 11 (ref 5–15)
BUN: 38 mg/dL — ABNORMAL HIGH (ref 6–20)
CHLORIDE: 85 mmol/L — AB (ref 101–111)
CO2: 42 mmol/L — ABNORMAL HIGH (ref 22–32)
CREATININE: 2.24 mg/dL — AB (ref 0.44–1.00)
Calcium: 8.9 mg/dL (ref 8.9–10.3)
GFR, EST AFRICAN AMERICAN: 24 mL/min — AB (ref >60)
GFR, EST NON AFRICAN AMERICAN: 21 mL/min — AB (ref >60)
Glucose, Bld: 240 mg/dL — ABNORMAL HIGH (ref 65–99)
POTASSIUM: 4.7 mmol/L (ref 3.5–5.1)
SODIUM: 138 mmol/L (ref 135–145)

## 2016-12-11 MED ORDER — PREDNISONE 10 MG PO TABS: ORAL_TABLET | ORAL | 0 refills | Status: DC

## 2016-12-11 MED ORDER — PREDNISONE 20 MG PO TABS
40.0000 mg | ORAL_TABLET | Freq: Every day | ORAL | Status: DC
  Administered 2016-12-12 – 2016-12-13 (×2): 40 mg via ORAL
  Filled 2016-12-11 (×2): qty 2

## 2016-12-11 MED ORDER — METOPROLOL TARTRATE 50 MG PO TABS: 50.0000 mg | ORAL_TABLET | Freq: Two times a day (BID) | ORAL | 1 refills | Status: AC

## 2016-12-11 MED ORDER — TEMAZEPAM 15 MG PO CAPS: 15.0000 mg | ORAL_CAPSULE | Freq: Every evening | ORAL | 0 refills | Status: DC | PRN

## 2016-12-11 MED ORDER — POTASSIUM CHLORIDE CRYS ER 20 MEQ PO TBCR: 20.0000 meq | EXTENDED_RELEASE_TABLET | Freq: Every day | ORAL | 0 refills | Status: DC

## 2016-12-11 MED ORDER — GUAIFENESIN ER 600 MG PO TB12: 600.0000 mg | ORAL_TABLET | Freq: Two times a day (BID) | ORAL | 1 refills | Status: DC

## 2016-12-11 MED ORDER — TIOTROPIUM BROMIDE MONOHYDRATE 2.5 MCG/ACT IN AERS: 2.0000 | INHALATION_SPRAY | Freq: Every day | RESPIRATORY_TRACT | 1 refills | Status: DC

## 2016-12-11 MED ORDER — LEVALBUTEROL HCL 1.25 MG/0.5ML IN NEBU: 1.2500 mg | INHALATION_SOLUTION | Freq: Three times a day (TID) | RESPIRATORY_TRACT | 12 refills | Status: DC | PRN

## 2016-12-11 MED ORDER — ARFORMOTEROL TARTRATE 15 MCG/2ML IN NEBU
15.0000 ug | INHALATION_SOLUTION | Freq: Two times a day (BID) | RESPIRATORY_TRACT | 1 refills | Status: DC
Start: 2016-12-11 — End: 2016-12-13

## 2016-12-11 MED ORDER — METOPROLOL TARTRATE 50 MG PO TABS
50.0000 mg | ORAL_TABLET | Freq: Two times a day (BID) | ORAL | Status: DC
  Administered 2016-12-11 – 2016-12-13 (×5): 50 mg via ORAL
  Filled 2016-12-11 (×5): qty 1

## 2016-12-11 MED ORDER — ONDANSETRON HCL 4 MG PO TABS: 4.0000 mg | ORAL_TABLET | Freq: Four times a day (QID) | ORAL | 0 refills | Status: AC | PRN

## 2016-12-11 MED ORDER — FUROSEMIDE 40 MG PO TABS: 40.0000 mg | ORAL_TABLET | Freq: Two times a day (BID) | ORAL | 1 refills | Status: DC

## 2016-12-11 MED ORDER — AMLODIPINE BESYLATE 2.5 MG PO TABS: 2.5000 mg | ORAL_TABLET | Freq: Every day | ORAL | 1 refills | Status: AC

## 2016-12-11 MED ORDER — LORAZEPAM 1 MG PO TABS: 1.0000 mg | ORAL_TABLET | Freq: Three times a day (TID) | ORAL | 0 refills | Status: DC | PRN

## 2016-12-11 MED ORDER — BUDESONIDE 0.5 MG/2ML IN SUSP: 0.5000 mg | Freq: Two times a day (BID) | RESPIRATORY_TRACT | 12 refills | Status: DC

## 2016-12-11 MED ORDER — OXYCODONE-ACETAMINOPHEN 5-325 MG PO TABS: 1.0000 | ORAL_TABLET | Freq: Three times a day (TID) | ORAL | 0 refills | Status: DC | PRN

## 2016-12-11 NOTE — Discharge Summary (Addendum)
Amanda Horton, is a 71 y.o. female  DOB 03/13/1946  MRN [id number].  Admission date:  12/03/2016  Admitting Physician  Robbie Lis, MD  Discharge Date:  12/11/2016   Primary MD  Nicoletta Dress, MD  Recommendations for primary care physician for things to follow:   Outpatient sleep study BMP in 1 week to monitor renal function Taper off prednisone slowly   Admission Diagnosis  Shortness of breath [R06.02]  Discharge Diagnosis  Shortness of breath [R06.02]    Principal Problem:   Acute on chronic respiratory failure with hypoxia (HCC) Active Problems:   GERD (gastroesophageal reflux disease)   OSA (obstructive sleep apnea)   Essential hypertension   COPD exacerbation (HCC)   Sepsis due to pneumonia (HCC)   Leukocytosis   Troponin level elevated   Chronic combined systolic and diastolic CHF (congestive heart failure) (HCC)   Acute renal failure superimposed on stage 3 chronic kidney disease (HCC)   Anemia of chronic kidney failure   Metabolic alkalosis   Anxiety and depression   Elevated troponin   Anemia of chronic kidney failure, stage 4 (severe) (HCC)   Venous stasis ulcer limited to breakdown of skin with varicose veins (HCC)   Stasis ulcer (HCC)   Shortness of breath   Hypoxia      Past Medical History:  Diagnosis Date  . Acute hyperkalemia   . Acute hypokalemia   . Acute on chronic renal failure ([place])   . Allergic rhinitis   . Anemia due to chronic kidney disease   . APC (atrial premature contractions)   . Arthritis of lumbar spine ([place])   . Benign essential hypertension   . Borderline diabetes   . Chronic insomnia   . Chronic venous insufficiency   . COPD (chronic obstructive pulmonary disease) ([place])   . GERD (gastroesophageal reflux disease)   . Hyperlipemia   . Hypoxia   . Lumbar disc narrowing   . On home oxygen therapy   . OSA (obstructive sleep apnea)   .  Osteoporosis   . Respiratory failure requiring intubation ([place])   . Spinal stenosis of lumbar region without neurogenic claudication   . Varicose veins with pain   . Vitamin B12 deficiency   . Vitamin D deficiency     Past Surgical History:  Procedure Laterality Date  . APPLICATION OF A-CELL OF EXTREMITY Right 08/30/2016   Procedure: APPLICATION OF A-CELL OF EXTREMITY;  Surgeon: Loel Lofty Dillingham, DO;  Location: [place];  Service: Plastics;  Laterality: Right;  . APPLICATION OF A-CELL OF EXTREMITY Right 09/20/2016   Procedure: APPLICATION OF A-CELL OF right lower leg wound;  Surgeon: Loel Lofty Dillingham, DO;  Location: [place];  Service: Plastics;  Laterality: Right;  . APPLICATION OF WOUND VAC Right 08/30/2016   Procedure: APPLICATION OF WOUND VAC;  Surgeon: Loel Lofty Dillingham, DO;  Location: [place];  Service: Plastics;  Laterality: Right;  . APPLICATION OF WOUND VAC Right 09/20/2016   Procedure: APPLICATION OF WOUND VAC right lower leg wound;  Surgeon:  Loel Lofty Dillingham, DO;  Location: [place];  Service: Plastics;  Laterality: Right;  . I&D EXTREMITY Right 08/30/2016   Procedure: IRRIGATION AND DEBRIDEMENT EXTREMITY RIGHT;  Surgeon: Loel Lofty Dillingham, DO;  Location: [place];  Service: Plastics;  Laterality: Right;  . INCISION AND DRAINAGE OF WOUND Right 09/20/2016   Procedure: IRRIGATION AND DEBRIDEMENT right lower leg wound;  Surgeon: Loel Lofty Dillingham, DO;  Location: [place];  Service: Plastics;  Laterality: Right;  . PARTIAL HYSTERECTOMY         HPI  from the history and physical done on the day of admission:    HPI: Amanda Horton is a 71 y.o. female with medical history significant for COPD on 2 L oxygen at home, OSA, combined CHF (last 2 D ECHO in 08/2016 with EF 45% and grade 1 DD), CKD stage 3 with baseline Cr of 1.2 in 08/2016, hypertension, depression and anxiety. She presented to [facility] with worsening shortness of breath at rest and with exertion over past few days prior to this  admission.  She used inhaler and nebulizer at home but with no significant symptomatic relief. She reported associated cough productive of clear to whitish sputum. She also had more frequent awakenings at night time due to shortness of breath and cough. She also reported subjective fevers at home. No chest pain, no palpations. She had some nausea but no vomiting. No abdominal pain, no diarrhea or constipation. She was seen in urgent care where herr oxygen saturation was in mid 80's and for this reason she was referred to ED for further evaluation.   ED Course: In ED, BP was 128/61, T max 101.65F, HR 106-151, RR 18-30, oxygen saturation 92% with BiPAP which has improved to 100%. At this time she is on 5 L [place] and her O2 sa is 95%. Blood work was notable for WBC count 14, hemoglobin 9.1, CO@ 34, Cr 1.67 (baseline in 08/2016 was 1.2), troponin was 0.16, normal lactic acid. BNP was 165. The 12 lead EKG showed atypical LBBB. CXR showed atelectasis but infection not excluded. She was given solumedrol 125 mg IV once and continuous nebs. She continues to have shortness of breath and wheezing. In addition, sepsis criteria met on admission with source likely pneumonia so azithromycin and rocephin started in ED.       Hospital Course:     Interval history: 71 y.o.WFPMHxDepression and Anxiety and  COPD/OSA with chronic hypoxic respiratory failure who is usually on2 L oxygen at home, also with history of Combined chronic Diastolic and Systolic CHF (last 2 D ECHO in 08/2016 with EF 45% and grade 1 DD), CKD stage 3(baseline Cr 1.2 in 08/2016), HTN, HLD,  presented to [facility] with worsening shortness of breath at rest and with exertion over past few days prior to this admission. She used inhaler and nebulizer at home but with no significant symptomatic relief. She reported associated cough productive of clear to whitish sputum. She also had more frequent awakenings at night time due to shortness of breath and cough. She  also reported subjective fevers at home. No chest pain, no palpations. She had some nausea but no vomiting. No abdominal pain, no diarrhea or constipation. She was seen in urgent care where herr oxygen saturation was in mid 80's and for this reason she was referred to ED for further evaluation.   ED Course:In ED, BP was 128/61, T max 101.65F, HR 106-151, RR 18-30, oxygen saturation 92% with BiPAP which has improved to 100%. At this  time she is on 5 L [place] and her O2 sa is 95%. Blood work was notable for WBC count 14, hemoglobin 9.1, CO@ 34, Cr 1.67 (baseline in 08/2016 was 1.2), troponin was 0.16, normal lactic acid. BNP was 165. The 12 lead EKG showed atypical LBBB. CXR showed atelectasis but infection not excluded. She was given solumedrol 125 mg IV once and continuous nebs. She continues to have shortness of breath and wheezing. In addition, sepsis criteria met on admission with source likely pneumonia so azithromycin and rocephin started in ED.    Plan:- 1)Acute on chronic respiratory failure with hypoxia ([place]) / COPD with acute exacerbation ([place]) / Oxygen dependent/OSA - overall continues to improve very slowly, shortness of breath at rest, dyspnea on exertion is improved, pulmonology consult appreciated, recommend slow taper of steroids, and bronchodilators, flutter valve, patient has not been compliant with CPAP/ BiPAP daily at bedtime, patient would need outpatient sleep study so she can get CPAP again a home. Patient will need outpatient pulmonology follow-up as well with Dr. Joya Gaskins  2)Sepsis due to pneumonia [facility]) /Leukocytosis- - Sepsis criteria met on admission with fever, tachycardia, tachypnea, hypoxia, leukocytosis and suspected respiratory infection, please see #1 above-improved with treatment as above including antibiotics  3)Chronic combined systolic and diastolic CHF (congestive heart failure) ([place])- shortness of breathat rest has resolved, however,dyspnea on exertion persist,  repeat echo from January 2018 with EF of 55% (improved from 45%), Net >19 lb wt loss since admission ((254>>246>>235lbs), her fluid balance is negative >9L since admission,cardiology consult appreciated, Lasix is on hold due to overdiuresis and Aki  4)HTN- stable, continue metoprolol 25 mg twice a day and amlodipine2.5 mgdaily  5)AKI- creatinine is now above 2.2 secondary to aggressive diuresis, discontinue Lasix, hold discharge until renal function improves  Code Status:Full   Disposition Plan:SNF for rehabilitation prior to returning home  Consults :Cardiology,physical therapy and pulmonology  Discharge Condition: Much improved overall  Follow UP   Contact information for follow-up providers    Magdalen Spatz, NP Follow up on 12/20/2016.   Specialty:  Pulmonary Disease Why:  10:00am with [place] pulmonary Nurse Pracitioner  Contact information: [address] [place] [address] [phone]        Asencion Noble, MD. Schedule an appointment as soon as possible for a visit in 2 week(s).   Specialty:  Pulmonary Disease Why:  She will need outpatient sleep study Contact information: [address] [place] [address] [phone]            Contact information for after-discharge care    Destination    [facility] .   Specialty:  [place] information: [address] [place] [address] [phone]                   Discharge Instructions    ([place]) Call MD:  Anytime you have any of the following symptoms: 1) 3 pound weight gain in 24 hours or 5 pounds in 1 week 2) shortness of breath, with or without a dry hacking cough 3) swelling in the hands, feet or stomach 4) if you have to sleep on extra pillows at night in order to breathe.    Complete by:  As directed    Call MD for:  difficulty breathing, headache or visual disturbances    Complete  by:  As directed    Call MD for:  persistant dizziness or light-headedness  Complete by:  As directed    Call MD for:  temperature >100.4    Complete by:  As directed    Diet - low sodium heart healthy    Complete by:  As directed    Diet - low sodium heart healthy    Complete by:  As directed    Discharge instructions    Complete by:  As directed    Take medications as prescribed Call if fevers or productive cough You need outpatient sleep study so you can get back on the BiPAP/CPAP   Heart Failure patients record your daily weight using the same scale at the same time of day    Complete by:  As directed    Increase activity slowly    Complete by:  As directed    Increase activity slowly    Complete by:  As directed         Discharge Medications     Allergies as of 12/11/2016      Reactions   Sulfa Antibiotics Nausea And Vomiting, Other (See Comments)   "Stayed sick"      Medication List    STOP taking these medications   sulfamethoxazole-trimethoprim 800-160 MG tablet Commonly known as:  BACTRIM DS,SEPTRA DS     TAKE these medications   ACCU-CHEK SMARTVIEW test strip Generic drug:  glucose blood   albuterol 108 (90 Base) MCG/ACT inhaler Commonly known as:  PROVENTIL HFA;VENTOLIN HFA Inhale 2 puffs into the lungs every 6 (six) hours as needed for wheezing or shortness of breath.   albuterol (2.5 MG/3ML) 0.083% nebulizer solution Commonly known as:  PROVENTIL Take 3 mLs (2.5 mg total) by nebulization every 6 (six) hours as needed for wheezing or shortness of breath.   alendronate 70 MG tablet Commonly known as:  FOSAMAX Take 70 mg by mouth once a week. Take with a full glass of water on an empty stomach.   amLODipine 2.5 MG tablet Commonly known as:  NORVASC Take 1 tablet (2.5 mg total) by mouth daily. Start taking on:  12/12/2016   arformoterol 15 MCG/2ML Nebu Commonly known as:  BROVANA Take 2 mLs (15 mcg total) by nebulization 2 (two) times daily.     aspirin 81 MG tablet Take 81 mg by mouth daily.   budesonide 0.5 MG/2ML nebulizer solution Commonly known as:  PULMICORT Take 2 mLs (0.5 mg total) by nebulization 2 (two) times daily.   Calcium Carbonate-Vitamin D 600-400 MG-UNIT tablet Take 1 tablet by mouth 2 (two) times daily.   cyanocobalamin 500 MCG tablet Take 500 mcg by mouth daily.   DULERA 200-5 MCG/ACT Aero Generic drug:  mometasone-formoterol Inhale 2 puffs into the lungs 2 (two) times daily.   escitalopram 20 MG tablet Commonly known as:  LEXAPRO Take 20 mg by mouth at bedtime.   furosemide 40 MG tablet Commonly known as:  LASIX Take 1 tablet (40 mg total) by mouth 2 (two) times daily. What changed:  medication strength  how much to take  when to take this   guaiFENesin 600 MG 12 hr tablet Commonly known as:  MUCINEX Take 1 tablet (600 mg total) by mouth 2 (two) times daily. What changed:  when to take this  reasons to take this   ipratropium-albuterol 0.5-2.5 (3) MG/3ML Soln Commonly known as:  DUONEB Take 3 mLs by nebulization every 4 (four) hours as needed (for breathing).   levalbuterol 1.25 MG/0.5ML nebulizer solution Commonly known as:  XOPENEX Take 1.25 mg by nebulization every  8 (eight) hours as needed for wheezing or shortness of breath.   LINZESS 145 MCG Caps capsule Generic drug:  linaclotide Take 145 mcg by mouth daily before breakfast.   LORazepam 1 MG tablet Commonly known as:  ATIVAN Take 1 tablet (1 mg total) by mouth every 8 (eight) hours as needed for anxiety.   metoprolol 50 MG tablet Commonly known as:  LOPRESSOR Take 1 tablet (50 mg total) by mouth 2 (two) times daily. What changed:  medication strength  how much to take   omeprazole 40 MG capsule Commonly known as:  PRILOSEC Take 1 capsule by mouth daily.   ondansetron 4 MG tablet Commonly known as:  ZOFRAN Take 1 tablet (4 mg total) by mouth every 6 (six) hours as needed for nausea.    oxyCODONE-acetaminophen 5-325 MG tablet Commonly known as:  PERCOCET/ROXICET Take 1 tablet by mouth every 8 (eight) hours as needed for moderate pain. What changed:  when to take this   potassium chloride SA 20 MEQ tablet Commonly known as:  K-DUR,KLOR-CON Take 1 tablet (20 mEq total) by mouth daily. What changed:  when to take this   predniSONE 10 MG tablet Commonly known as:  DELTASONE Take 40 mg daily for 5 days with FOOD, 20 mg daily for 4 days, 10 mg daily for 3 days then STOP   ranitidine 150 MG tablet Commonly known as:  ZANTAC Take 1 tablet by mouth 2 (two) times daily.   temazepam 15 MG capsule Commonly known as:  RESTORIL Take 1 capsule (15 mg total) by mouth at bedtime as needed for sleep.   Tiotropium Bromide Monohydrate 2.5 MCG/ACT Aers Inhale 2 puffs into the lungs daily.   Vitamin D (Ergocalciferol) 50000 units Caps capsule Commonly known as:  DRISDOL Take 1 capsule by mouth every 7 (seven) days.   VITAMIN D PO Take 5,000 Units by mouth daily.       Major procedures and Radiology Reports - PLEASE review detailed and final reports for all details, in brief -   Dg Chest Port 1 View  Result Date: 12/09/2016 CLINICAL DATA:  Respiratory failure. Pt c/o of SOB, but denies chest pain. EXAM: PORTABLE CHEST 1 VIEW COMPARISON:  12/06/2016 FINDINGS: Stable mild cardiomegaly. No mediastinal or hilar masses. Mild linear atelectasis at the lung bases, also stable. Lungs otherwise clear. No convincing pleural effusion.  No pneumothorax. IMPRESSION: 1. No acute cardiopulmonary disease. 2. Mild persistent lung base atelectasis. Electronically Signed   By: Amie Portland M.D.   On: 12/09/2016 07:36   Dg Chest Port 1 View  Result Date: 12/06/2016 CLINICAL DATA:  Shortness of breath and coughing. Symptoms are improving. EXAM: PORTABLE CHEST 1 VIEW COMPARISON:  12/03/2016 FINDINGS: Heart size is at the upper limits of normal. Mediastinal shadows are normal. Minimal basilar  atelectasis persists, improving. No worsening or new findings. No effusions. No acute bone finding. IMPRESSION: Improving basilar atelectasis . Electronically Signed   By: Paulina Fusi M.D.   On: 12/06/2016 08:09   Dg Chest Port 1 View  Result Date: 12/03/2016 CLINICAL DATA:  Shortness of breath. EXAM: PORTABLE CHEST 1 VIEW COMPARISON:  Chest radiograph 09/26/2016. FINDINGS: Monitoring leads overlie the patient. Stable cardiomegaly. Heterogeneous opacities within the lung bases bilaterally. No pleural effusion or pneumothorax. IMPRESSION: Cardiomegaly. Bibasilar heterogeneous opacities favored to represent atelectasis. Infection not excluded. Electronically Signed   By: Annia Belt M.D.   On: 12/03/2016 16:24    Micro Results   Recent Results (from the past 240  hour(s))  Urine culture     Status: Abnormal   Collection Time: 12/03/16  3:35 PM  Result Value Ref Range Status   Specimen Description URINE, RANDOM  Final   Special Requests NONE  Final   Culture MULTIPLE SPECIES PRESENT, SUGGEST RECOLLECTION (A)  Final   Report Status 12/05/2016 FINAL  Final  Blood Culture (routine x 2)     Status: None   Collection Time: 12/03/16  3:38 PM  Result Value Ref Range Status   Specimen Description BLOOD LEFT FOREARM  Final   Special Requests IN PEDIATRIC BOTTLE 2CC  Final   Culture NO GROWTH 5 DAYS  Final   Report Status 12/08/2016 FINAL  Final  Blood Culture (routine x 2)     Status: None   Collection Time: 12/03/16  3:45 PM  Result Value Ref Range Status   Specimen Description BLOOD RIGHT ANTECUBITAL  Final   Special Requests IN PEDIATRIC BOTTLE 2CC  Final   Culture NO GROWTH 5 DAYS  Final   Report Status 12/08/2016 FINAL  Final  MRSA PCR Screening     Status: None   Collection Time: 12/03/16  9:45 PM  Result Value Ref Range Status   MRSA by PCR NEGATIVE NEGATIVE Final    Comment:        The GeneXpert MRSA Assay (FDA approved for NASAL specimens only), is one component of  a comprehensive MRSA colonization surveillance program. It is not intended to diagnose MRSA infection nor to guide or monitor treatment for MRSA infections.   Culture, sputum-assessment     Status: None   Collection Time: 12/03/16  9:58 PM  Result Value Ref Range Status   Specimen Description Expect. Sput  Final   Special Requests NONE  Final   Sputum evaluation THIS SPECIMEN IS ACCEPTABLE FOR SPUTUM CULTURE  Final   Report Status 12/05/2016 FINAL  Final  Culture, respiratory (NON-Expectorated)     Status: None   Collection Time: 12/03/16  9:58 PM  Result Value Ref Range Status   Specimen Description Expect. Sput  Final   Special Requests NONE Reflexed from [id number]  Final   Gram Stain   Final    FEW WBC PRESENT, PREDOMINANTLY PMN FEW SQUAMOUS EPITHELIAL CELLS PRESENT MODERATE GRAM POSITIVE COCCOBACILLUS FEW GRAM NEGATIVE COCCOBACILLI    Culture Consistent with normal respiratory flora.  Final   Report Status 12/07/2016 FINAL  Final  Respiratory Panel by PCR     Status: None   Collection Time: 12/04/16 12:21 PM  Result Value Ref Range Status   Adenovirus NOT DETECTED NOT DETECTED Final   Coronavirus 229E NOT DETECTED NOT DETECTED Final   Coronavirus HKU1 NOT DETECTED NOT DETECTED Final   Coronavirus NL63 NOT DETECTED NOT DETECTED Final   Coronavirus OC43 NOT DETECTED NOT DETECTED Final   Metapneumovirus NOT DETECTED NOT DETECTED Final   Rhinovirus / Enterovirus NOT DETECTED NOT DETECTED Final   Influenza A NOT DETECTED NOT DETECTED Final   Influenza B NOT DETECTED NOT DETECTED Final   Parainfluenza Virus 1 NOT DETECTED NOT DETECTED Final   Parainfluenza Virus 2 NOT DETECTED NOT DETECTED Final   Parainfluenza Virus 3 NOT DETECTED NOT DETECTED Final   Parainfluenza Virus 4 NOT DETECTED NOT DETECTED Final   Respiratory Syncytial Virus NOT DETECTED NOT DETECTED Final   Bordetella pertussis NOT DETECTED NOT DETECTED Final   Chlamydophila pneumoniae NOT DETECTED NOT DETECTED  Final   Mycoplasma pneumoniae NOT DETECTED NOT DETECTED Final  Today   Subjective    Ronne Savoia today has no Fevers, no vomiting diarrhea,  no chest pains, ambulating better,        Patient has been seen and examined prior to discharge   Objective   Blood pressure 129/62, pulse 75, temperature 98.2 F (36.8 C), temperature source Oral, resp. rate 18, height '5\' 2"'$  (1.575 m), weight 107 kg (235 lb 12.8 oz), SpO2 95 %.   Intake/Output Summary (Last 24 hours) at 12/11/16 1638 Last data filed at 12/11/16 1242  Gross per 24 hour  Intake              800 ml  Output             1651 ml  Net             -851 ml    Exam Gen:- Awake  Morbidly obese and in no acute distress HEENT:- [place].AT,  nasal cannula at 2 L/m Neck-Supple Neck,No JVD,  Lungs- mostly clear , slightly diminished in bases CV- S1, S2 normal, irregular but not irregularly irregular Abd-  +ve B.Sounds, Abd Soft, No tenderness,   increased truncal adiposity Extremity/Skin:- Intact peripheral pulses  , mostly resolved lower extremity edema, chronic venostasis with leg wraps    Data Review   CBC w Diff:  Lab Results  Component Value Date   WBC 6.7 12/08/2016   HGB 9.5 (L) 12/08/2016   HCT 32.6 (L) 12/08/2016   PLT 315 12/08/2016   LYMPHOPCT 5 12/08/2016   MONOPCT 6 12/08/2016   EOSPCT 0 12/08/2016   BASOPCT 0 12/08/2016    CMP:  Lab Results  Component Value Date   NA 138 12/11/2016   K 4.7 12/11/2016   CL 85 (L) 12/11/2016   CO2 42 (H) 12/11/2016   BUN 38 (H) 12/11/2016   CREATININE 2.24 (H) 12/11/2016   PROT 6.2 (L) 12/05/2016   ALBUMIN 2.8 (L) 12/05/2016   BILITOT 0.1 (L) 12/05/2016   ALKPHOS 49 12/05/2016   AST 15 12/05/2016   ALT 9 (L) 12/05/2016  .   Total Discharge time is about 33 minutes  Adair Lemar M.D on 12/11/2016 at 4:38 PM  [facility]   Office  [phone]  Dragon dictation system was used to create this note, attempts have been made to correct errors,  however presence of uncorrected errors is not a reflection quality of care provided

## 2016-12-11 NOTE — Progress Notes (Addendum)
Patient Name: Amanda Horton Date of Encounter: 12/11/2016  Primary Cardiologist: Dr. Sundra Aland Problem List     Principal Problem:   Acute on chronic respiratory failure with hypoxia [facility]) Active Problems:   GERD (gastroesophageal reflux disease)   OSA (obstructive sleep apnea)   Essential hypertension   COPD exacerbation (HCC)   Sepsis due to pneumonia (HCC)   Leukocytosis   Troponin level elevated   Chronic combined systolic and diastolic CHF (congestive heart failure) (HCC)   Acute renal failure superimposed on stage 3 chronic kidney disease (HCC)   Anemia of chronic kidney failure   Metabolic alkalosis   Anxiety and depression   Elevated troponin   Anemia of chronic kidney failure, stage 4 (severe) (HCC)   Venous stasis ulcer limited to breakdown of skin with varicose veins (HCC)   Stasis ulcer (HCC)   Shortness of breath   Hypoxia     Subjective   No complaints, feels better, no chest pain, no SOB.  Did ambulate some last pm  Inpatient Medications    Scheduled Meds: . amLODipine  2.5 mg Oral Daily  . arformoterol  15 mcg Nebulization BID  . aspirin  81 mg Oral Daily  . budesonide (PULMICORT) nebulizer solution  0.5 mg Nebulization BID  . calcium-vitamin D  1 tablet Oral BID WC  . docusate sodium  100 mg Oral Daily  . escitalopram  20 mg Oral QHS  . famotidine  20 mg Oral Daily  . furosemide  80 mg Oral BID  . heparin subcutaneous  5,000 Units Subcutaneous Q8H  . ipratropium-albuterol  3 mL Nebulization TID  . linaclotide  145 mcg Oral QAC breakfast  . mouth rinse  15 mL Mouth Rinse BID  . methylPREDNISolone (SOLU-MEDROL) injection  40 mg Intravenous Q12H  . metoprolol tartrate  25 mg Oral BID  . pantoprazole  40 mg Oral Daily  . potassium chloride SA  20 mEq Oral BID  . sodium chloride flush  3 mL Intravenous Q12H  . cyanocobalamin  500 mcg Oral Daily  . Vitamin D (Ergocalciferol)  50,000 Units Oral Q7 days   Continuous Infusions: . sodium  chloride 10 mL/hr at 12/03/16 2239   PRN Meds: acetaminophen **OR** [DISCONTINUED] acetaminophen, guaiFENesin, levalbuterol, LORazepam, ondansetron **OR** ondansetron (ZOFRAN) IV, oxyCODONE-acetaminophen, temazepam   Vital Signs    Vitals:   12/10/16 2002 12/10/16 2102 12/11/16 0000 12/11/16 0428  BP: 114/68 136/76 140/78 135/76  Pulse:  89 72 80  Resp:   16   Temp: 97.7 F (36.5 C)  98 F (36.7 C) 98.1 F (36.7 C)  TempSrc: Oral  Oral Oral  SpO2: 94%  94% 95%  Weight:    235 lb 12.8 oz (107 kg)  Height:        Intake/Output Summary (Last 24 hours) at 12/11/16 0718 Last data filed at 12/11/16 0430  Gross per 24 hour  Intake              440 ml  Output             2500 ml  Net            -2060 ml   Filed Weights   12/08/16 0355 12/09/16 0641 12/11/16 0428  Weight: 246 lb 12.8 oz (111.9 kg) 240 lb 11.9 oz (109.2 kg) 235 lb 12.8 oz (107 kg)    Physical Exam    GEN: Well nourished,  in no acute distress.  HEENT: normocephalic, sclera clear, mucus membranes  moist.  Neck: Supple, no JVD, or masses. Cardiac: RRR, no murmurs, rubs, or gallops. No clubbing, cyanosis, edema.  Radials/DP/PT 2+ and equal bilaterally.  Respiratory:  Respirations regular and unlabored, breath sounds bilaterally though diminished in the bases,  without rales,+ rhonchi no wheezes. GI: Abd -Soft, nontender, nondistended, BS + x 4. MS: no deformity or atrophy. Skin: warm and dry, brisk capillary refill, scabbed ulcer Lt lower ext.  Rt lower ext wrapped.  Legs with no edema-  Neuro:  Alert and oriented X 3 MAE, follows commands Psych: answers questions appropriately,Normal and pleasant affect.   Labs    CBC No results for input(s): WBC, NEUTROABS, HGB, HCT, MCV, PLT in the last 72 hours. Basic Metabolic Panel  Recent Labs  12/09/16 1410  NA 139  K 4.2  CL 85*  CO2 41*  GLUCOSE 173*  BUN 29*  CREATININE 1.58*  CALCIUM 8.3*   Liver Function Tests No results for input(s): AST, ALT,  ALKPHOS, BILITOT, PROT, ALBUMIN in the last 72 hours. No results for input(s): LIPASE, AMYLASE in the last 72 hours. Cardiac Enzymes No results for input(s): CKTOTAL, CKMB, CKMBINDEX, TROPONINI in the last 72 hours. BNP Invalid input(s): POCBNP D-Dimer No results for input(s): DDIMER in the last 72 hours. Hemoglobin A1C No results for input(s): HGBA1C in the last 72 hours. Fasting Lipid Panel No results for input(s): CHOL, HDL, LDLCALC, TRIG, CHOLHDL, LDLDIRECT in the last 72 hours. Thyroid Function Tests No results for input(s): TSH, T4TOTAL, T3FREE, THYROIDAB in the last 72 hours.  Invalid input(s): FREET3  Telemetry    SR to ST - Personally Reviewed  ECG    No new since the v2nd - Personally Reviewed  Radiology    No results found.  Cardiac Studies   Echo 12/05/16 ------------------------------------------------------------------- Study Conclusions  - Left ventricle: The cavity size was normal. Wall thickness was   increased in a pattern of moderate LVH. The estimated ejection   fraction was 55%. Septal-lateral dyssynchrony. The septum appears   hypokinetic. Doppler parameters are consistent with abnormal left   ventricular relaxation (grade 1 diastolic dysfunction). - Aortic valve: There was no stenosis. - Mitral valve: There was no significant regurgitation. - Right ventricle: The cavity size was normal. Systolic function   was normal. - Pulmonary arteries: No complete TR doppler jet so unable to   estimate PA systolic pressure. - Systemic veins: IVC measured 2.6 cm with < 50% respirophasic   variation, suggesting RA pressure 15 mmHg.  Impressions:  - Technically difficult study with poor acoustic windows. Normal LV   size with moderate LV hypertrophy. EF 55%. Septal-lateral   dyssynchrony with septal hypokinesis. Normal RV size and systolic   function. No significant valvular abnormalities.   Patient Profile     71yo obese female with a history of  chronic diastolic/systolic CHF with EF [id number] (now EF 55% by recent echo), CKD stage 3, HTN, severe oxygen dependent COPD and morbid obesity who presented with increased SOB, hoarseness, nonproductive cough and declining oxygen saturations in the low 80s on day of admission. On admission she initially required BiPAP. CXRAY with bibasilar atelectasis but no frank edema but her BNp was mildly elevated. She was given IV lasix and has diuresed 1.5L with improved creatinine. She has not had any chest pain and no change in her chronic LE edema but has noticed some increase in abdominal girth. Unfortunately she does not weigh herself at home  [place]    1. Dyspnea/Acute on chronic  diastolic HF?:Presented with multiple days of worsening dyspnea. Developed a fever prior to admission with nonproductive cough. CXR on admission showed bilateral atelectasis that has improved since admission. She was given IV lasix with 7.4L net neg UOP thus far. Diuresed 2060 ml yesterday.  She is neg. 9,477 since admit.  wt is down from pk of 254 to 235 lbs.   --Echo showed slightly improved EF to 55% from 45% back in 9/17. Was given IV antibiotics on admission for sepsis and elevated WBC with some improvement. Reports her breathing is somewhat better but not at her baseline.  -- Lungs diminished bases and her abdomen is less firm.  No LE edema.  Renal function is stable.   Changed to PO Lasix 80mg  BID yesterday with good UOP. Continue current dose for now - unsure of "dry weight" -- Questions whether her underlying COPD has worsened as she has not had follow up with pulmonary for quite some time. Recommend pulmonary evaluation.  2. Arrhythmia: Noted to have abnormal beats on telemetry. Per Dr. Radford Pax "There was some concern about possible PAF on monitor but all telemetry strips revealed and no obvious PAF. There is a lot of wandering baseline artifact."   Does have hx of OSA. Currently in a SR on tele with a few  episodes of nonsustained atrial tachycardia.   Increase BB to 50 mg BID & monitor HR  3. HTN:BP much improved and controlled.  Amlodipine added. Titrate as needed.  Continue BB.    4. COPD:Reports she was dx in 2007 and was followed by pulmonary for a time, but has not been seen for awhile. Normally wears 2L, but has noticed that she has been becoming more dyspnic at home, and does increase her O2 at times. Question whether this has worsened.   5. Anemia:Hgb 11.1 back in 9/17, now 9.5. Denies any active bleeding, could be contributing to dyspnea.    Signed, Cecilie Kicks, NP  12/11/2016, 7:18 AM  [place] Pager [phone]  After 5 or weekends [phone]   I have seen, examined and evaluated the patient this AM along with Cecilie Kicks, NP.  After reviewing all the available data and chart, we discussed the patients laboratory, study & physical findings as well as symptoms in detail. I agree with her findings, examination as well as impression recommendations as per our discussion.    Respiratory status improved - continues to diurese on PO Lasix - for now continue current dose --> check BNP in AM (probably can return to once daily dosing as home regimen with PRN additional dose for Wgt gain > 3 Lb in 1 day( Concern for PAF/PAT - increase BB to 50 mg BID BP still high - on low dose Amlodipine & now increased BB dose; suspect that we can increase CCB prior in the next few days. Agree with pulmonary evaluation.     Glenetta Hew, M.D., M.S. Interventional Cardiologist   Pager # [phone] Phone # [phone] [place], [place] [address]

## 2016-12-11 NOTE — Progress Notes (Addendum)
Patient Demographics:    Amanda Horton, is a 71 y.o. female, DOB - 26-Aug-1946, HUT:[id number]  Admit date - 12/03/2016   Admitting Physician Robbie Lis, MD  Outpatient Primary MD for the patient is Nicoletta Dress, MD  LOS - 8   Chief Complaint  Patient presents with  . Shortness of Breath        Subjective:    [name] today has no fevers, no emesis,  No chest pain,  Creatinine is trending up now above 2.21, would discontinue Lasix and hold to discharge until renal function improves, consider nephrology consult if renal function fails to improve as anticipated   Assessment  & Plan :    Principal Problem:   Acute on chronic respiratory failure with hypoxia (HCC) Active Problems:   GERD (gastroesophageal reflux disease)   OSA (obstructive sleep apnea)   Essential hypertension   COPD exacerbation (HCC)   Sepsis due to pneumonia (HCC)   Leukocytosis   Troponin level elevated   Chronic combined systolic and diastolic CHF (congestive heart failure) (HCC)   Acute renal failure superimposed on stage 3 chronic kidney disease (HCC)   Anemia of chronic kidney failure   Metabolic alkalosis   Anxiety and depression   Elevated troponin   Anemia of chronic kidney failure, stage 4 (severe) (HCC)   Venous stasis ulcer limited to breakdown of skin with varicose veins (HCC)   Stasis ulcer (HCC)   Shortness of breath   Hypoxia  Interval history: 71 y.o.WFPMHxDepression and Anxiety and  COPD/OSA with chronic hypoxic respiratory failure who is usually on2 L oxygen at home, also with history of Combined chronic Diastolic and Systolic CHF (last 2 D ECHO in 08/2016 with EF 45% and grade 1 DD), CKD stage 3(baseline Cr 1.2 in 08/2016), HTN, HLD,  presented to [facility] with worsening shortness of breath at rest and with exertion over past few days prior to this admission. She used inhaler and nebulizer at  home but with no significant symptomatic relief. She reported associated cough productive of clear to whitish sputum. She also had more frequent awakenings at night time due to shortness of breath and cough. She also reported subjective fevers at home. No chest pain, no palpations. She had some nausea but no vomiting. No abdominal pain, no diarrhea or constipation. She was seen in urgent care where herr oxygen saturation was in mid 80's and for this reason she was referred to ED for further evaluation.   ED Course:In ED, BP was 128/61, T max 101.57F, HR 106-151, RR 18-30, oxygen saturation 92% with BiPAP which has improved to 100%. At this time she is on 5 L [place] and her O2 sa is 95%. Blood work was notable for WBC count 14, hemoglobin 9.1, CO@ 34, Cr 1.67 (baseline in 08/2016 was 1.2), troponin was 0.16, normal lactic acid. BNP was 165. The 12 lead EKG showed atypical LBBB. CXR showed atelectasis but infection not excluded. She was given solumedrol 125 mg IV once and continuous nebs. She continues to have shortness of breath and wheezing. In addition, sepsis criteria met on admission with source likely pneumonia so azithromycin and rocephin started in ED.    Plan:- 1)Acute on chronic respiratory failure with hypoxia ([place]) / COPD with  acute exacerbation (HCC) / Oxygen dependent/OSA - overall continues to improve very slowly, shortness of breath at rest, dyspnea on exertion is improved, pulmonology consult appreciated, recommend slow taper of steroids, and bronchodilators, flutter valve, patient has not been compliant with CPAP/ BiPAP daily at bedtime, patient would need outpatient sleep study so she can get CPAP again a home. Patient will need outpatient pulmonology follow-up as well with Dr. Joya Gaskins  2)Sepsis due to pneumonia [facility]) /Leukocytosis- - Sepsis criteria met on admission with fever, tachycardia, tachypnea, hypoxia, leukocytosis and suspected respiratory infection, please see #1 above-improved  with treatment as above including antibiotics  3)Chronic combined systolic and diastolic CHF (congestive heart failure) ([place])- shortness of breathat rest has resolved, however,dyspnea on exertion persist, repeat echo from January 2018 with EF of 55% (improved from 45%), Net >19 lb wt loss since admission ((254>>246>>235lbs), her fluid balance is negative >9L since admission,cardiology consult appreciated, Lasix is on hold due to overdiuresis and [place]  4)HTN- stable, continue metoprolol 25 mg twice a day and amlodipine2.5 mgdaily  5)AKI- creatinine is now above 2.2 secondary to aggressive diuresis, discontinue Lasix, hold discharge until renal function improves.  consider nephrology consult if renal function fails to improve as anticipated   Code Status:Full   Disposition Plan:hold discharge until renal function improves. SNF for rehabilitation prior to returning home  Consults :Cardiology,physical therapy and pulmonology  Discharge Condition: Much improved overall  DVT Prophylaxis: Heparin [place]   Lab Results  Component Value Date   PLT 315 12/08/2016    Inpatient Medications  Scheduled Meds: . amLODipine  2.5 mg Oral Daily  . arformoterol  15 mcg Nebulization BID  . aspirin  81 mg Oral Daily  . budesonide (PULMICORT) nebulizer solution  0.5 mg Nebulization BID  . calcium-vitamin D  1 tablet Oral BID WC  . docusate sodium  100 mg Oral Daily  . escitalopram  20 mg Oral QHS  . famotidine  20 mg Oral Daily  . heparin subcutaneous  5,000 Units Subcutaneous Q8H  . ipratropium-albuterol  3 mL Nebulization TID  . linaclotide  145 mcg Oral QAC breakfast  . mouth rinse  15 mL Mouth Rinse BID  . metoprolol tartrate  50 mg Oral BID  . pantoprazole  40 mg Oral Daily  . potassium chloride SA  20 mEq Oral BID  . [START ON 12/12/2016] predniSONE  40 mg Oral Q breakfast  . sodium chloride flush  3 mL Intravenous Q12H  . cyanocobalamin  500 mcg Oral Daily  .  Vitamin D (Ergocalciferol)  50,000 Units Oral Q7 days   Continuous Infusions: . sodium chloride 10 mL/hr at 12/03/16 2239   PRN Meds:.guaiFENesin, levalbuterol, LORazepam, ondansetron **OR** ondansetron (ZOFRAN) IV, oxyCODONE-acetaminophen, temazepam    Anti-infectives    Start     Dose/Rate Route Frequency Ordered Stop   12/07/16 1600  azithromycin (ZITHROMAX) tablet 500 mg     500 mg Oral Daily 12/07/16 1257 12/09/16 0940   12/04/16 2000  cefTRIAXone (ROCEPHIN) 1 g in dextrose 5 % 50 mL IVPB  Status:  Discontinued     1 g 100 mL/hr over 30 Minutes Intravenous Every 24 hours 12/03/16 2010 12/08/16 1511   12/04/16 2000  azithromycin (ZITHROMAX) 500 mg in dextrose 5 % 250 mL IVPB  Status:  Discontinued     500 mg 250 mL/hr over 60 Minutes Intravenous Every 24 hours 12/03/16 2010 12/07/16 1256   12/03/16 2015  cefTRIAXone (ROCEPHIN) 1 g in dextrose 5 % 50 mL IVPB  Status:  Discontinued     1 g 100 mL/hr over 30 Minutes Intravenous  Once 12/03/16 2000 12/03/16 2244   12/03/16 2015  azithromycin (ZITHROMAX) 500 mg in dextrose 5 % 250 mL IVPB  Status:  Discontinued     500 mg 250 mL/hr over 60 Minutes Intravenous  Once 12/03/16 2000 12/03/16 2244   12/03/16 1645  cefTRIAXone (ROCEPHIN) 1 g in dextrose 5 % 50 mL IVPB     1 g 100 mL/hr over 30 Minutes Intravenous  Once 12/03/16 1631 12/03/16 1756   12/03/16 1645  azithromycin (ZITHROMAX) 500 mg in dextrose 5 % 250 mL IVPB     500 mg 250 mL/hr over 60 Minutes Intravenous  Once 12/03/16 1631 12/03/16 2016        Objective:   Vitals:   12/11/16 1243 12/11/16 1413 12/11/16 1537 12/11/16 1606  BP: (!) 101/58   129/62  Pulse: 73  74 75  Resp: 20  (!) 25 18  Temp: 98.5 F (36.9 C)  98.3 F (36.8 C) 98.2 F (36.8 C)  TempSrc: Oral  Oral Oral  SpO2: 97% 96% 96% 95%  Weight:      Height:        Wt Readings from Last 3 Encounters:  12/11/16 107 kg (235 lb 12.8 oz)  09/26/16 108.9 kg (240 lb)  09/20/16 112.5 kg (248 lb)      Intake/Output Summary (Last 24 hours) at 12/11/16 1642 Last data filed at 12/11/16 1242  Gross per 24 hour  Intake              800 ml  Output             1651 ml  Net             -851 ml     Physical Exam  Gen:- Awake Alert,  Morbidly obese,  in no acute distress HEENT:- [place].AT, No sclera icterus, nasal cannula at 2 L/m Neck-Supple Neck,No JVD,.  Lungs-  mostly clear, slightly diminished in bases CV- S1, S2 normal Abd-  +ve B.Sounds, Abd Soft, No tenderness,   increased truncal adiposity Extremity/Skin:- Mostly resolved lower estimate the edema, leg wraps noted, chronic venous stasis noted  Data Review:   Micro Results Recent Results (from the past 240 hour(s))  Urine culture     Status: Abnormal   Collection Time: 12/03/16  3:35 PM  Result Value Ref Range Status   Specimen Description URINE, RANDOM  Final   Special Requests NONE  Final   Culture MULTIPLE SPECIES PRESENT, SUGGEST RECOLLECTION (A)  Final   Report Status 12/05/2016 FINAL  Final  Blood Culture (routine x 2)     Status: None   Collection Time: 12/03/16  3:38 PM  Result Value Ref Range Status   Specimen Description BLOOD LEFT FOREARM  Final   Special Requests IN PEDIATRIC BOTTLE 2CC  Final   Culture NO GROWTH 5 DAYS  Final   Report Status 12/08/2016 FINAL  Final  Blood Culture (routine x 2)     Status: None   Collection Time: 12/03/16  3:45 PM  Result Value Ref Range Status   Specimen Description BLOOD RIGHT ANTECUBITAL  Final   Special Requests IN PEDIATRIC BOTTLE [facility]  Final   Culture NO GROWTH 5 DAYS  Final   Report Status 12/08/2016 FINAL  Final  MRSA PCR Screening     Status: None   Collection Time: 12/03/16  9:45 PM  Result Value Ref Range Status  MRSA by PCR NEGATIVE NEGATIVE Final    Comment:        The GeneXpert MRSA Assay (FDA approved for NASAL specimens only), is one component of a comprehensive MRSA colonization surveillance program. It is not intended to diagnose MRSA infection  nor to guide or monitor treatment for MRSA infections.   Culture, sputum-assessment     Status: None   Collection Time: 12/03/16  9:58 PM  Result Value Ref Range Status   Specimen Description Expect. Sput  Final   Special Requests NONE  Final   Sputum evaluation THIS SPECIMEN IS ACCEPTABLE FOR SPUTUM CULTURE  Final   Report Status 12/05/2016 FINAL  Final  Culture, respiratory (NON-Expectorated)     Status: None   Collection Time: 12/03/16  9:58 PM  Result Value Ref Range Status   Specimen Description Expect. Sput  Final   Special Requests NONE Reflexed from [id number]  Final   Gram Stain   Final    FEW WBC PRESENT, PREDOMINANTLY PMN FEW SQUAMOUS EPITHELIAL CELLS PRESENT MODERATE GRAM POSITIVE COCCOBACILLUS FEW GRAM NEGATIVE COCCOBACILLI    Culture Consistent with normal respiratory flora.  Final   Report Status 12/07/2016 FINAL  Final  Respiratory Panel by PCR     Status: None   Collection Time: 12/04/16 12:21 PM  Result Value Ref Range Status   Adenovirus NOT DETECTED NOT DETECTED Final   Coronavirus 229E NOT DETECTED NOT DETECTED Final   Coronavirus HKU1 NOT DETECTED NOT DETECTED Final   Coronavirus NL63 NOT DETECTED NOT DETECTED Final   Coronavirus OC43 NOT DETECTED NOT DETECTED Final   Metapneumovirus NOT DETECTED NOT DETECTED Final   Rhinovirus / Enterovirus NOT DETECTED NOT DETECTED Final   Influenza A NOT DETECTED NOT DETECTED Final   Influenza B NOT DETECTED NOT DETECTED Final   Parainfluenza Virus 1 NOT DETECTED NOT DETECTED Final   Parainfluenza Virus 2 NOT DETECTED NOT DETECTED Final   Parainfluenza Virus 3 NOT DETECTED NOT DETECTED Final   Parainfluenza Virus 4 NOT DETECTED NOT DETECTED Final   Respiratory Syncytial Virus NOT DETECTED NOT DETECTED Final   Bordetella pertussis NOT DETECTED NOT DETECTED Final   Chlamydophila pneumoniae NOT DETECTED NOT DETECTED Final   Mycoplasma pneumoniae NOT DETECTED NOT DETECTED Final    Radiology Reports Dg Chest Port 1  View  Result Date: 12/09/2016 CLINICAL DATA:  Respiratory failure. Pt c/o of SOB, but denies chest pain. EXAM: PORTABLE CHEST 1 VIEW COMPARISON:  12/06/2016 FINDINGS: Stable mild cardiomegaly. No mediastinal or hilar masses. Mild linear atelectasis at the lung bases, also stable. Lungs otherwise clear. No convincing pleural effusion.  No pneumothorax. IMPRESSION: 1. No acute cardiopulmonary disease. 2. Mild persistent lung base atelectasis. Electronically Signed   By: Lajean Manes M.D.   On: 12/09/2016 07:36   Dg Chest Port 1 View  Result Date: 12/06/2016 CLINICAL DATA:  Shortness of breath and coughing. Symptoms are improving. EXAM: PORTABLE CHEST 1 VIEW COMPARISON:  12/03/2016 FINDINGS: Heart size is at the upper limits of normal. Mediastinal shadows are normal. Minimal basilar atelectasis persists, improving. No worsening or new findings. No effusions. No acute bone finding. IMPRESSION: Improving basilar atelectasis . Electronically Signed   By: Nelson Chimes M.D.   On: 12/06/2016 08:09   Dg Chest Port 1 View  Result Date: 12/03/2016 CLINICAL DATA:  Shortness of breath. EXAM: PORTABLE CHEST 1 VIEW COMPARISON:  Chest radiograph 09/26/2016. FINDINGS: Monitoring leads overlie the patient. Stable cardiomegaly. Heterogeneous opacities within the lung bases bilaterally. No pleural effusion or  pneumothorax. IMPRESSION: Cardiomegaly. Bibasilar heterogeneous opacities favored to represent atelectasis. Infection not excluded. Electronically Signed   By: Lovey Newcomer M.D.   On: 12/03/2016 16:24     CBC  Recent Labs Lab 12/05/16 0302 12/06/16 0300 12/07/16 0337 12/08/16 0257  WBC 11.0* 8.2 8.6 6.7  HGB 8.3* 8.7* 9.5* 9.5*  HCT 28.0* 30.2* 33.3* 32.6*  PLT 274 300 338 315  MCV 83.3 84.8 85.8 84.9  MCH 24.7* 24.4* 24.5* 24.7*  MCHC 29.6* 28.8* 28.5* 29.1*  RDW 15.6* 15.3 15.2 15.0  LYMPHSABS  --  0.4* 1.4 0.3*  MONOABS  --  0.4 1.3* 0.4  EOSABS  --  0.0 0.0 0.0  BASOSABS  --  0.0 0.0 0.0     Chemistries   Recent Labs Lab 12/05/16 0302 12/06/16 0300 12/07/16 0337 12/08/16 0257 12/09/16 1410 12/11/16 1519  NA 135 139 140 138 139 138  K 3.9 4.7 4.2 3.9 4.2 4.7  CL 88* 93* 90* 87* 85* 85*  CO2 36* 44* 40* 42* 41* 42*  GLUCOSE 197* 185* 106* 253* 173* 240*  BUN 24* 32* 32* 31* 29* 38*  CREATININE 2.11* 1.91* 1.84* 1.70* 1.58* 2.24*  CALCIUM 8.4* 8.8* 9.2 8.3* 8.3* 8.9  MG  --  2.2 2.2 2.1  --   --   AST 15  --   --   --   --   --   ALT 9*  --   --   --   --   --   ALKPHOS 49  --   --   --   --   --   BILITOT 0.1*  --   --   --   --   --    ------------------------------------------------------------------------------------------------------------------ No results for input(s): CHOL, HDL, LDLCALC, TRIG, CHOLHDL, LDLDIRECT in the last 72 hours.  Lab Results  Component Value Date   HGBA1C 5.8 (H) 08/27/2016   ------------------------------------------------------------------------------------------------------------------ No results for input(s): TSH, T4TOTAL, T3FREE, THYROIDAB in the last 72 hours.  Invalid input(s): FREET3 ------------------------------------------------------------------------------------------------------------------ No results for input(s): VITAMINB12, FOLATE, FERRITIN, TIBC, IRON, RETICCTPCT in the last 72 hours.  Coagulation profile No results for input(s): INR, PROTIME in the last 168 hours.  No results for input(s): DDIMER in the last 72 hours.  Cardiac Enzymes No results for input(s): CKMB, TROPONINI, MYOGLOBIN in the last 168 hours.  Invalid input(s): CK ------------------------------------------------------------------------------------------------------------------    Component Value Date/Time   BNP 165.1 (H) 12/03/2016 1545     Kimani Hovis M.D on 12/11/2016 at 4:42 PM  Between 7am to 7pm - Pager - [phone]  After 7pm go to [email or web address] - password [email or web address]  [facility] -  Office  [phone]  Dragon  dictation system was used to create this note, attempts have been made to correct errors, however presence of uncorrected errors is not a reflection quality of care provided

## 2016-12-11 NOTE — Progress Notes (Signed)
Physical Therapy Treatment Patient Details Name: Amanda Horton MRN: [id number] DOB: 11/07/1946 Today's Date: 12/11/2016    History of Present Illness Amanda Horton is a 71 y.o. female with medical history significant for COPD on 2 L oxygen at home, OSA, CHF , CKD stage 3, HTN, depression and anxiety. She presented to [facility] with worsening shortness of breath at rest and with exertion over past few days prior to this admission    PT Comments    Patient continues to demonstrate decreased cardiovascular endurance and decreased activity tolerance secondary to fatigue and dyspnea on exertion. Sp02 dropped to 86% on 2L/min 02. Requires frequent rest breaks during mobility. Continues to recommend SNF to maximize independence, mobility and decrease fall risk prior to return home.   SNF     Equipment Recommendations  None recommended by PT    Recommendations for Other Services       Precautions / Restrictions Precautions Precautions: Fall Precaution Comments: monitor sats and HR Restrictions Weight Bearing Restrictions: No    Mobility  Bed Mobility               General bed mobility comments: pt sitting OOB in recliner when PT entered room  Transfers Overall transfer level: Needs assistance Equipment used: Rolling walker (2 wheeled) Transfers: Sit to/from Stand Sit to Stand: Min assist;+2 physical assistance;Min guard         General transfer comment: Assist to power to standing with multiple attempts from low surface. Min guard on second attempt and steadying assist.   Ambulation/Gait Ambulation/Gait assistance: Min guard Ambulation Distance (Feet): 30 Feet (+40') Assistive device: Rolling walker (2 wheeled) Gait Pattern/deviations: Step-through pattern;Decreased stride length Gait velocity: Decreased Gait velocity interpretation: Below normal speed for age/gender General Gait Details: Ambulated on 2L/min 02 and Sp02 dropped to 86%. Cues for pursed lip breathing.  Resolved in ~2 minutes with seated rest break. 1 seated rest break.   Stairs            Wheelchair Mobility    Modified Rankin (Stroke Patients Only)       Balance Overall balance assessment: Needs assistance Sitting-balance support: Feet supported;No upper extremity supported Sitting balance-Leahy Scale: Good Sitting balance - Comments: Able to comb hair sitting in chair withotu difficulty.    Standing balance support: During functional activity;Single extremity supported Standing balance-Leahy Scale: Fair Standing balance comment: Reliant on at least 1 UE support in standing.                    Cognition Arousal/Alertness: Awake/alert Behavior During Therapy: WFL for tasks assessed/performed Overall Cognitive Status: Within Functional Limits for tasks assessed                      Exercises      General Comments General comments (skin integrity, edema, etc.): Son present during session.      Pertinent Vitals/Pain Pain Assessment: No/denies pain    Home Living                      Prior Function            PT Goals (current goals can now be found in the care plan section) Progress towards PT goals: Progressing toward goals    Frequency    Min 3X/week      PT Plan Current plan remains appropriate    Co-evaluation   Reason for Co-Treatment: For patient/therapist safety  End of Session Equipment Utilized During Treatment: Oxygen Activity Tolerance: Patient limited by fatigue Patient left: in chair;with call bell/phone within reach;with family/visitor present     Time: 1115-1140 PT Time Calculation (min) (ACUTE ONLY): 25 min  Charges:  $Gait Training: 8-22 mins                    G Codes:      Amanda Horton 12/11/2016, 11:47 AM Wray Kearns, PT, DPT [phone]

## 2016-12-11 NOTE — Care Management Note (Addendum)
Case Management Note  Patient Details  Name: Amanda Horton MRN: [id number] Date of Birth: [birth year]  Subjective/Objective:  Transfer from [address]. Initially presented for Acute on Chronic Respiratory Failure. IV Solumedrol to be changed to po prednisone per RN. Plan to discharge to SNF once stable. Per RN plan for d/c 12-11-16. CSW has a bed at [facility].                  Action/Plan: CM did make CSW of disposition needs. CM will continue to monitor.   Expected Discharge Date:                  Expected Discharge Plan:  [place]  In-House Referral:  Clinical Social Work  Discharge planning Services  CM Consult  Post Acute Care Choice:  NA Choice offered to:  Patient, NA  DME Arranged:  N/A DME Agency:  NA  HH Arranged:  NA HH Agency:  NA  Status of Service:  Completed, signed off  If discussed at [place] of Stay Meetings, dates discussed:  12-12-16  Additional Comments:  Bethena Roys, RN 12/11/2016, 11:09 AM

## 2016-12-11 NOTE — NC FL2 (Signed)
[place] LEVEL OF CARE SCREENING TOOL     IDENTIFICATION  Patient Name: Amanda Horton Birthdate: 08/11/1946 Sex: female Admission Date (Current Location): 12/03/2016  [facility] and [place] Number:  [employer] and Address:  [facility] [place]. [facility], [place] [address], [place], [place] [address]      Provider Number: [id number]  Attending Physician Name and Address:  Roxan Hockey, MD  Relative Name and Phone Number:       Current Level of Care: Hospital Recommended Level of Care: [place] Prior Approval Number:    Date Approved/Denied:   PASRR Number: [id number]  Discharge Plan: SNF    Current Diagnoses: Patient Active Problem List   Diagnosis Date Noted  . Hypoxia   . Stasis ulcer ([place])   . Shortness of breath   . Elevated troponin   . Anemia of chronic kidney failure, stage 4 (severe) ([place])   . Venous stasis ulcer limited to breakdown of skin with varicose veins ([place])   . COPD exacerbation ([place]) 12/03/2016  . Sepsis due to pneumonia ([place]) 12/03/2016  . Leukocytosis 12/03/2016  . Troponin level elevated 12/03/2016  . Chronic combined systolic and diastolic CHF (congestive heart failure) ([place]) 12/03/2016  . Acute renal failure superimposed on stage 3 chronic kidney disease ([place]) 12/03/2016  . Anemia of chronic kidney failure 12/03/2016  . Metabolic alkalosis 0000000  . Anxiety and depression 12/03/2016  . Acute on chronic respiratory failure with hypoxia ([place]) 12/03/2016  . Essential hypertension 08/27/2016  . GERD (gastroesophageal reflux disease)   . OSA (obstructive sleep apnea)     Orientation RESPIRATION BLADDER Height & Weight     Self, Time, Situation, Place  O2 (2L) Bipap at night expiratory pressure = 12 and inspiratory pressure =8. 2L bleed in at night. Continent Weight: 107 kg (235 lb 12.8 oz) Height:  5\' 2"  (157.5 cm)  BEHAVIORAL SYMPTOMS/MOOD NEUROLOGICAL BOWEL NUTRITION STATUS   (NONE)   (NONE) Continent Diet (Regular)  AMBULATORY STATUS COMMUNICATION OF NEEDS Skin   Limited Assist Verbally Other (Comment) (Right lower leg slow healing wound)                       Personal Care Assistance Level of Assistance  Bathing, Feeding, Dressing Bathing Assistance: Limited assistance Feeding assistance: Independent Dressing Assistance: Limited assistance     Functional Limitations Info  Sight, Hearing, Speech Sight Info: Adequate Hearing Info: Adequate Speech Info: Adequate    SPECIAL CARE FACTORS FREQUENCY  PT (By licensed PT), OT (By licensed OT)     PT Frequency: 5/week OT Frequency: 5/week            Contractures Contractures Info: Not present    Additional Factors Info  Code Status, Allergies, Psychotropic Code Status Info: Full Code Allergies Info: Sulfa Antibiotics Psychotropic Info: Lexaprop         Current Medications (12/11/2016):  This is the current hospital active medication list Current Facility-Administered Medications  Medication Dose Route Frequency Provider Last Rate Last Dose  . 0.9 %  sodium chloride infusion   Intravenous Continuous Robbie Lis, MD 10 mL/hr at 12/03/16 2239    . amLODipine (NORVASC) tablet 2.5 mg  2.5 mg Oral Daily Erma Heritage, PA   2.5 mg at 12/11/16 1052  . arformoterol (BROVANA) nebulizer solution 15 mcg  15 mcg Nebulization BID Jose Shirl Harris, MD   15 mcg at 12/11/16 [phone]  . aspirin chewable tablet 81  mg  81 mg Oral Daily Robbie Lis, MD   81 mg at 12/11/16 1052  . budesonide (PULMICORT) nebulizer solution 0.5 mg  0.5 mg Nebulization BID Jose Shirl Harris, MD   0.5 mg at 12/11/16 0758  . calcium-vitamin D (OSCAL WITH D) 500-200 MG-UNIT per tablet 1 tablet  1 tablet Oral BID WC Robbie Lis, MD   1 tablet at 12/11/16 [phone]  . docusate sodium (COLACE) capsule 100 mg  100 mg Oral Daily Robbie Lis, MD   100 mg at 12/11/16 1052  . escitalopram (LEXAPRO) tablet 20 mg  20 mg Oral QHS Robbie Lis,  MD   20 mg at 12/10/16 2102  . famotidine (PEPCID) tablet 20 mg  20 mg Oral Daily Robbie Lis, MD   20 mg at 12/11/16 1051  . furosemide (LASIX) tablet 80 mg  80 mg Oral BID Sueanne Margarita, MD   80 mg at 12/11/16 0743  . guaiFENesin (MUCINEX) 12 hr tablet 600 mg  600 mg Oral BID PRN Robbie Lis, MD      . heparin injection 5,000 Units  5,000 Units Subcutaneous Q8H Allie Bossier, MD   5,000 Units at 12/10/16 2102  . ipratropium-albuterol (DUONEB) 0.5-2.5 (3) MG/3ML nebulizer solution 3 mL  3 mL Nebulization TID Roxan Hockey, MD   3 mL at 12/11/16 0758  . levalbuterol (XOPENEX) nebulizer solution 1.25 mg  1.25 mg Nebulization Q8H PRN Allie Bossier, MD      . linaclotide [facility]) capsule 145 mcg  145 mcg Oral QAC breakfast Robbie Lis, MD   145 mcg at 12/11/16 0743  . LORazepam (ATIVAN) tablet 1 mg  1 mg Oral Q8H PRN Robbie Lis, MD   1 mg at 12/06/16 1050  . MEDLINE mouth rinse  15 mL Mouth Rinse BID Robbie Lis, MD   15 mL at 12/06/16 0850  . metoprolol (LOPRESSOR) tablet 50 mg  50 mg Oral BID Leonie Man, MD   50 mg at 12/11/16 1052  . ondansetron (ZOFRAN) tablet 4 mg  4 mg Oral Q6H PRN Robbie Lis, MD       Or  . ondansetron [facility]) injection 4 mg  4 mg Intravenous Q6H PRN Robbie Lis, MD      . oxyCODONE-acetaminophen (PERCOCET/ROXICET) 5-325 MG per tablet 1 tablet  1 tablet Oral Q6H PRN Robbie Lis, MD      . pantoprazole (PROTONIX) EC tablet 40 mg  40 mg Oral Daily Robbie Lis, MD   40 mg at 12/11/16 1051  . potassium chloride SA (K-DUR,KLOR-CON) CR tablet 20 mEq  20 mEq Oral BID Robbie Lis, MD   20 mEq at 12/11/16 1051  . [START ON 12/12/2016] predniSONE (DELTASONE) tablet 40 mg  40 mg Oral Q breakfast Brand Males, MD      . sodium chloride flush (NS) 0.9 % injection 3 mL  3 mL Intravenous Q12H Robbie Lis, MD   3 mL at 12/11/16 1052  . temazepam (RESTORIL) capsule 15 mg  15 mg Oral QHS PRN Robbie Lis, MD   15 mg at 12/04/16 2341  . vitamin B-12  (CYANOCOBALAMIN) tablet 500 mcg  500 mcg Oral Daily Robbie Lis, MD   500 mcg at 12/11/16 1051  . Vitamin D (Ergocalciferol) (DRISDOL) capsule 50,000 Units  50,000 Units Oral Q7 days Robbie Lis, MD   50,000 Units at 12/11/16 1051  Discharge Medications: Please see discharge summary for a list of discharge medications.  Relevant Imaging Results:  Relevant Lab Results:   Additional Information SSN: [id number]  Rigoberto Noel, LCSW

## 2016-12-11 NOTE — Progress Notes (Signed)
Name: Amanda Horton MRN: [id number] DOB: 1946-11-18    ADMISSION DATE:  12/03/2016 CONSULTATION DATE:  Ernst Breach  REFERRING MD :  Radford Pax (cards)   CHIEF COMPLAINT:  Dyspnea, ?AECOPD   BRIEF PATIENT DESCRIPTION: 71 yo former smoker with a 55 pack year smoking history female with hx OSA, COPD on 2L home O2 (previously followed by Dr. Joya Gaskins but lost to f/u), OSA, chronic combined CHF, CKD 3, HTN initially admitted 12/31 with acute on chronic respiratory failure thought r/t acute exacerbation of CHF.  Cardiology consulted and pt diuresed well, now NET neg but ongoing dyspnea so PCCM consulted for ?AECOPD.   SIGNIFICANT EVENTS   Echo 1/2>>> Normal LV  size with moderate LV hypertrophy. EF 55%. Septal-lateral   dyssynchrony with septal hypokinesis. Normal RV size and systolic   function. No significant valvular abnormalities.  1/4 - -ccm consult 1.5 - Still SOB with scheduled BD. Continues to have Exp Wheezing. She is currently on 2.5 L [place]. Home O2 is 2L. She walked with PT, desaturation to 90% on 3L, returned to chair.   SUBJECTIVE/OVERNIGHT/INTERVAL HX 12/12/15 - refused qhs bipap due to "not helping me, drops my o2:". Started feeling better yesterday. As of 1/7 - diuresing with net negative balance; net -9.5L an d7kg down. Afebrile. AKI better.   VITAL SIGNS: Temp:  [97.7 F (36.5 C)-98.2 F (36.8 C)] 98.2 F (36.8 C) (01/08 0749) Pulse Rate:  [65-89] 73 (01/08 0749) Resp:  [13-16] 16 (01/08 0749) BP: (114-151)/(68-84) 151/68 (01/08 0749) SpO2:  [92 %-97 %] 97 % (01/08 0802) Weight:  [107 kg (235 lb 12.8 oz)] 107 kg (235 lb 12.8 oz) (01/08 0428)  PHYSICAL EXAMINATION: General:  Pleasant, obese female, NAD in chair, cushingoid Neuro:  Awake, alert, speaking in full sentences HEENT:  Mm moist  Cardiovascular:  s1s2 distant , 2+ LE edema Lungs:  resps even, mildly labored on 2.L  , NO  wheeze throughout, Abdomen:  Round, soft, mildly distended  Musculoskeletal:  Warm and dry, 1-2+  BLE edema  PULMONARY  Recent Labs Lab 12/06/16 0338  PHART 7.356  PCO2ART 72.9*  PO2ART 86.0  HCO3 39.8*  O2SAT 96.3    CBC  Recent Labs Lab 12/06/16 0300 12/07/16 0337 12/08/16 0257  HGB 8.7* 9.5* 9.5*  HCT 30.2* 33.3* 32.6*  WBC 8.2 8.6 6.7  PLT 300 338 315    COAGULATION No results for input(s): INR in the last 168 hours.  CARDIAC   Recent Labs Lab 12/04/16 1150  TROPONINI 0.09*   No results for input(s): PROBNP in the last 168 hours.   CHEMISTRY  Recent Labs Lab 12/05/16 0302 12/06/16 0300 12/07/16 0337 12/08/16 0257 12/09/16 1410  NA 135 139 140 138 139  K 3.9 4.7 4.2 3.9 4.2  CL 88* 93* 90* 87* 85*  CO2 36* 44* 40* 42* 41*  GLUCOSE 197* 185* 106* 253* 173*  BUN 24* 32* 32* 31* 29*  CREATININE 2.11* 1.91* 1.84* 1.70* 1.58*  CALCIUM 8.4* 8.8* 9.2 8.3* 8.3*  MG  --  2.2 2.2 2.1  --    Estimated Creatinine Clearance: 38.1 mL/min (by C-G formula based on SCr of 1.58 mg/dL (H)).   LIVER  Recent Labs Lab 12/05/16 0302  AST 15  ALT 9*  ALKPHOS 49  BILITOT 0.1*  PROT 6.2*  ALBUMIN 2.8*     INFECTIOUS No results for input(s): LATICACIDVEN, PROCALCITON in the last 168 hours.   ENDOCRINE CBG (last 3)   Recent Labs  12/09/16 0824 12/10/16  0741 12/10/16 1153  GLUCAP 161* 179* 186*         IMAGING x48h  - image(s) personally visualized  -   highlighted in bold No results found.     ASSESSMENT / PLAN:  Acute on chronic respiratory failure - r/t acute on chronic CHF, decompensated OSA and AECOPD.  OSA - previously wore bipap qhs but stopped because it "wasnt keeping my oxygen level up" AECOPD - on Home oxygen at 2 L   - much improved. She will benefit long terms for home BiPAP Qhs but refuses. I explained the hypercarbia and quality of life rationale but she refuses  PLAN -  Solumedrol chagne to prednisone 40mg  daily and taper over next 12-14 days Continue abx per primary- rocephin, aizthro  Ongoing diuresis per  cards as renal function allows ( 1.70) Aggressive pulm hygiene/ mobilization  F/u CXR  Mucolytic  Scheduled and prn BD's  qhs bipap - refuses  Needs Outpt pulm f/u with PFT ' s / Sleep Study and to re-establish BiPAP QHs  use once acute issues resolved.  Offer  Pulmicort bid neb + Duoneb q6h + prn  Vs her usual regimen of Siriva and LABA/ICS combination (Used  Dulera in past). Per patient preference.   Family : No family at bedside. Updated patient  12/11/2016 . D./w DR Ellyn Hack at bedside    [facility] Future Appointments Date Time Provider [place]  12/20/2016 10:00 AM Magdalen Spatz, NP [facility] None     PCCM will sig noff   Dr. Brand Males, M.D., [facility].C.P Pulmonary and Critical Care Medicine Staff Physician [place] [facility] Pager: [phone], If no answer or between  15:00h - 7:00h: call [phone]  12/11/2016 10:02 AM

## 2016-12-11 NOTE — Clinical Social Work Note (Signed)
Auth request started with [facility]. Pending Case # is [id number]. Patient has not been seen by PT or OT since 12/08/2016. [employer] is requesting more recent PT and OT progress notes. CSW will submit clinical information to [facility] once received. CSW has been informed that the patient is ready for DC today.  Liz Beach MSW, [place], [place], [id number]

## 2016-12-11 NOTE — Care Management Important Message (Signed)
Important Message  Patient Details  Name: Amanda Horton MRN: [id number] Date of Birth: 02-27-1946   Medicare Important Message Given:  Yes    [name] 12/11/2016, 1:00 PM

## 2016-12-11 NOTE — Clinical Social Work Placement (Addendum)
   CLINICAL SOCIAL WORK PLACEMENT  NOTE  Date:  12/11/2016  Patient Details  Name: Amanda Horton MRN: [id number] Date of Birth: 02-15-1946  Clinical Social Work is seeking post-discharge placement for this patient at the [place] level of care (*CSW will initial, date and re-position this form in  chart as items are completed):  Yes   Patient/family provided with [facility] Work Department's list of facilities offering this level of care within the geographic area requested by the patient (or if unable, by the patient's family).  Yes   Patient/family informed of their freedom to choose among providers that offer the needed level of care, that participate in Medicare, Medicaid or managed care program needed by the patient, have an available bed and are willing to accept the patient.  Yes   Patient/family informed of [facility]'s ownership interest in [facility] and [facility], as well as of the fact that they are under no obligation to receive care at these facilities.  PASRR submitted to [employer] on       PASRR number received on       Existing PASRR number confirmed on 12/11/16     FL2 transmitted to all facilities in geographic area requested by pt/family on 12/11/16     FL2 transmitted to all facilities within larger geographic area on       Patient informed that his/her managed care company has contracts with or will negotiate with certain facilities, including the following:        Yes   Patient/family informed of bed offers received.  Patient chooses bed at [facility] and [place] recommends and patient chooses bed at      Patient to be transferred to [facility] [place] on 12/11/16.  Patient to be transferred to facility by Ambulance     Patient family notified on 12/11/16 of transfer.  Name of family member notified:  Patient will notify son     PHYSICIAN Please prepare priority discharge summary,  including medications, Please prepare prescriptions, Please sign FL2, Please sign DNR     Additional Comment:  Per MD patient ready for DC to [facility] SNF. RN, patient, patient's family, and facility notified of DC. RN given number for report. DC packet on chart. Ambulance transport requested for patient. [employer] auth received from both [place] ([id number], 3 days auth, next review 1/10, rug level RVA) and [employer] ([id number]). The patient states the son will be bringing her bi-pap to [facility] this evening. CSW signing off.   _______________________________________________ Rigoberto Noel, LCSW 12/11/2016, 3:47 PM

## 2016-12-11 NOTE — Progress Notes (Signed)
Occupational Therapy Treatment Patient Details Name: Amanda Horton MRN: [id number] DOB: November 07, 1946 Today's Date: 12/11/2016    History of present illness Amanda Horton is a 71 y.o. female with medical history significant for COPD on 2 L oxygen at home, OSA, CHF , CKD stage 3, HTN, depression and anxiety. She presented to [facility] with worsening shortness of breath at rest and with exertion over past few days prior to this admission   OT comments  Pt incorporating pursed lip breathing and pacing with rest breaks interspersed during activity. Difficulty standing from low surfaces. Pt with 02 sats to 86% with activity on 2L 02. Pt remains significantly deconditioned and will benefit from post acute rehab in SNF prior to return home. Will continue to follow.  Follow Up Recommendations  SNF;Supervision/Assistance - 24 hour    Equipment Recommendations       Recommendations for Other Services      Precautions / Restrictions Precautions Precautions: Fall Precaution Comments: monitor sats and HR Restrictions Weight Bearing Restrictions: No       Mobility Bed Mobility               General bed mobility comments: pt sitting OOB in recliner when OT entered room  Transfers Overall transfer level: Needs assistance Equipment used: Rolling walker (2 wheeled) Transfers: Sit to/from Stand Sit to Stand: Min assist;+2 physical assistance;Min guard         General transfer comment: 2 person assist from low surface, min guard from elevated surface    Balance Overall balance assessment: Needs assistance Sitting-balance support: Feet supported;No upper extremity supported Sitting balance-Leahy Scale: Good Sitting balance - Comments: Able to comb hair sitting in chair withotu difficulty.    Standing balance support: During functional activity;Single extremity supported Standing balance-Leahy Scale: Fair Standing balance comment: Reliant on at least 1 UE support in standing.                    ADL Overall ADL's : Needs assistance/impaired     Grooming: Brushing hair;Sitting;Set up           Upper Body Dressing : Set up;Supervision/safety;Sitting       Toilet Transfer: Min guard;Ambulation;RW;BSC   Toileting- Clothing Manipulation and Hygiene: Moderate assistance       Functional mobility during ADLs: Rolling walker;Minimal assistance General ADL Comments: Encouraged pursed lip breathing.      Vision                     Perception     Praxis      Cognition   Behavior During Therapy: WFL for tasks assessed/performed Overall Cognitive Status: Within Functional Limits for tasks assessed                       Extremity/Trunk Assessment               Exercises     Shoulder Instructions       General Comments      Pertinent Vitals/ Pain       Pain Assessment: No/denies pain  Home Living                                          Prior Functioning/Environment              Frequency  Min 2X/week  Progress Toward Goals  OT Goals(current goals can now be found in the care plan section)  Progress towards OT goals: Progressing toward goals  Acute Rehab OT Goals Patient Stated Goal: to go home Time For Goal Achievement: 12/18/16 Potential to Achieve Goals: Good  Plan Discharge plan remains appropriate    Co-evaluation    PT/OT/SLP Co-Evaluation/Treatment: Yes Reason for Co-Treatment: For patient/therapist safety   OT goals addressed during session: ADL's and self-care      End of Session Equipment Utilized During Treatment: Oxygen;Rolling walker;Gait belt (2L)   Activity Tolerance Patient tolerated treatment well   Patient Left in chair;with call bell/phone within reach;with family/visitor present   Nurse Communication          Time: [id number] OT Time Calculation (min): 19 min  Charges: OT General Charges $OT Visit: 1 Procedure OT Treatments $Therapeutic  Activity: 8-22 mins  Malka So 12/11/2016, 12:33 PM [phone]

## 2016-12-12 ENCOUNTER — Inpatient Hospital Stay (HOSPITAL_COMMUNITY): Payer: Commercial Managed Care - HMO

## 2016-12-12 DIAGNOSIS — N183 Chronic kidney disease, stage 3 (moderate): Secondary | ICD-10-CM

## 2016-12-12 DIAGNOSIS — I83001 Varicose veins of unspecified lower extremity with ulcer of thigh: Secondary | ICD-10-CM

## 2016-12-12 DIAGNOSIS — J9601 Acute respiratory failure with hypoxia: Secondary | ICD-10-CM

## 2016-12-12 DIAGNOSIS — L97101 Non-pressure chronic ulcer of unspecified thigh limited to breakdown of skin: Secondary | ICD-10-CM

## 2016-12-12 DIAGNOSIS — J9602 Acute respiratory failure with hypercapnia: Secondary | ICD-10-CM

## 2016-12-12 DIAGNOSIS — N170 Acute kidney failure with tubular necrosis: Secondary | ICD-10-CM

## 2016-12-12 DIAGNOSIS — I493 Ventricular premature depolarization: Secondary | ICD-10-CM

## 2016-12-12 LAB — BASIC METABOLIC PANEL
ANION GAP: 12 (ref 5–15)
BUN: 37 mg/dL — ABNORMAL HIGH (ref 6–20)
CALCIUM: 9.1 mg/dL (ref 8.9–10.3)
CO2: 41 mmol/L — ABNORMAL HIGH (ref 22–32)
Chloride: 86 mmol/L — ABNORMAL LOW (ref 101–111)
Creatinine, Ser: 2.07 mg/dL — ABNORMAL HIGH (ref 0.44–1.00)
GFR, EST AFRICAN AMERICAN: 27 mL/min — AB (ref >60)
GFR, EST NON AFRICAN AMERICAN: 23 mL/min — AB (ref >60)
Glucose, Bld: 117 mg/dL — ABNORMAL HIGH (ref 65–99)
POTASSIUM: 3.7 mmol/L (ref 3.5–5.1)
Sodium: 139 mmol/L (ref 135–145)

## 2016-12-12 LAB — BLOOD GAS, ARTERIAL
Acid-Base Excess: 21 mmol/L — ABNORMAL HIGH (ref 0.0–2.0)
Bicarbonate: 46.8 mmol/L — ABNORMAL HIGH (ref 20.0–28.0)
O2 CONTENT: 2.5 L/min
O2 Saturation: 90.5 %
PATIENT TEMPERATURE: 98.6
PH ART: 7.448 (ref 7.350–7.450)
pCO2 arterial: 68.7 mmHg (ref 32.0–48.0)
pO2, Arterial: 60.2 mmHg — ABNORMAL LOW (ref 83.0–108.0)

## 2016-12-12 LAB — GLUCOSE, CAPILLARY: GLUCOSE-CAPILLARY: 148 mg/dL — AB (ref 65–99)

## 2016-12-12 NOTE — Discharge Summary (Signed)
Physician Discharge Summary  Amanda Horton [id number] DOB: 1946-07-05 DOA: 12/03/2016  PCP: Nicoletta Dress, MD  Admit date: 12/03/2016 Discharge date: 12/12/2016  Time spent: 35 minutes  Recommendations for Outpatient Follow-up:   Acute on chronic respiratory failure with hypoxia / COPD with acute exacerbation   - Patient was treated using COPD gold / focused order set utilized - Patient now back to baseline home O2 requirements. 2 L O2  -Brovana -Pulmicort -DuoNeb -BiPAP QHS to maintain SPO2 89-93%  Sepsis due to pneumonia - Resolved  Chronic combined systolic and diastolic CHF (congestive heart failure) - 2 D ECHO in 08/2016 with EF 45% and grade 1 DD - strict in and out since admission - 9.4 L -Daily weight Filed Weights   12/11/16 0428 12/12/16 0500 12/12/16 0508  Weight: 107 kg (235 lb 12.8 oz) 107.7 kg (237 lb 6.4 oz) 107.7 kg (237 lb 6.4 oz)  -Unknown base weight: But would estimate patient's base weight upon discharge at 239 pounds (108 kg) - Metoprolol 50 mg BID -Transfuse for hemoglobin<8 -Per cardiology Lasix 80 mg by mouth daily . Plan would then be once daily dosing as home regimen with PRN additional dose for Wgt gain > 3 Lb in 1 day or greater than 5 pounds in 1 week. -Schedule Follow up with Dr. Fransico Him Cardiology. Office will call patient  Essential hypertension -See CHF  Acute renal failure superimposed on CKD stage 3 ( Baseline Cr In 08/2016 1.2) -Presented with a creatinine of 1.67 - Acute elevation in Cr likely due to sepsis, acute infection but also possibly lasix - Discontinue Lasix and monitor Lab Results  Component Value Date   CREATININE 2.07 (H) 12/12/2016   CREATININE 2.24 (H) 12/11/2016   CREATININE 1.58 (H) 12/09/2016  -SNF to monitor creatinine closely most likely will not return to 1.2.  Anemia of chronic kidney failure Recent Labs Lab 12/06/16 0300 12/07/16 0337 12/08/16 0257  HGB 8.7* 9.5* 9.5*  - Hemoglobin  stable  Anxiety and depression - Continue lexapro   Chronic venous stasis changes bilateral lower extremities - WOC consulted      Discharge Diagnoses:  Principal Problem:   Acute on chronic respiratory failure with hypoxia (HCC) Active Problems:   GERD (gastroesophageal reflux disease)   OSA (obstructive sleep apnea)   Essential hypertension   COPD exacerbation (HCC)   Sepsis due to pneumonia (HCC)   Leukocytosis   Troponin level elevated   Chronic combined systolic and diastolic CHF (congestive heart failure) (HCC)   Acute renal failure superimposed on stage 3 chronic kidney disease (HCC)   Anemia of chronic kidney failure   Metabolic alkalosis   Anxiety and depression   Elevated troponin   Anemia of chronic kidney failure, stage 4 (severe) (HCC)   Venous stasis ulcer limited to breakdown of skin with varicose veins (HCC)   Stasis ulcer (HCC)   Shortness of breath   Hypoxia   Discharge Condition: Stable  Diet recommendation: Heart healthy  Filed Weights   12/11/16 0428 12/12/16 0500 12/12/16 0508  Weight: 107 kg (235 lb 12.8 oz) 107.7 kg (237 lb 6.4 oz) 107.7 kg (237 lb 6.4 oz)    History of present illness:  71 y.o.WF PMHx Depression and Anxiety COPD on 2 L oxygen at home, OSA, Combined chronic Diastolic and Systolic CHF (last 2 D ECHO in 08/2016 with EF 45% and grade 1 DD), CKD stage 3(baseline Cr 1.2 in 08/2016), HTN, HLD,  She presented to [facility] with worsening shortness of  breath at rest and with exertion over past few days prior to this admission. She used inhaler and nebulizer at home but with no significant symptomatic relief. She reported associated cough productive of clear to whitish sputum. She also had more frequent awakenings at night time due to shortness of breath and cough. She also reported subjective fevers at home. No chest pain, no palpations. She had some nausea but no vomiting. No abdominal pain, no diarrhea or constipation. She was seen in  urgent care where herr oxygen saturation was in mid 80's and for this reason she was referred to ED for further evaluation.  During this hospitalization patient was treated for acute on chronic respiratory failure with hypoxia secondary to noncompliance with CHF regimen, acute renal failure on CKD stage III, and COPD exacerbation. After aggressive diuresis and treatment of COPD exacerbation patient has significantly improved and is ready for discharge.   Consultants:  Cardiology  Procedures/Significant Events:  1/2 Echocardiogram:Left ventricle:  moderate LVH. -LVEF = 55%. Septal-lateral dyssynchrony. Septum appears hypokinetic. - (grade 1 diastolic dysfunction).   VENTILATOR SETTINGS: BiPAP QHS I/E 12/6    Cultures 12/31 HIV negative 12/31 strep pneumo urine antigen negative 1/1 influenza A negative 1/1 respiratory virus panel negative    Antimicrobials: Anti-infectives    Start     Stop   12/07/16 1600  azithromycin (ZITHROMAX) tablet 500 mg     12/09/16 0940   12/04/16 2000  cefTRIAXone (ROCEPHIN) 1 g in dextrose 5 % 50 mL IVPB  Status:  Discontinued     12/08/16 1511   12/04/16 2000  azithromycin (ZITHROMAX) 500 mg in dextrose 5 % 250 mL IVPB  Status:  Discontinued     12/07/16 1256   12/03/16 2015  cefTRIAXone (ROCEPHIN) 1 g in dextrose 5 % 50 mL IVPB  Status:  Discontinued     12/03/16 2244   12/03/16 2015  azithromycin (ZITHROMAX) 500 mg in dextrose 5 % 250 mL IVPB  Status:  Discontinued     12/03/16 2244   12/03/16 1645  cefTRIAXone (ROCEPHIN) 1 g in dextrose 5 % 50 mL IVPB     12/03/16 1756   12/03/16 1645  azithromycin (ZITHROMAX) 500 mg in dextrose 5 % 250 mL IVPB     12/03/16 2016      Discharge Exam: Vitals:   12/12/16 1136 12/12/16 1343 12/12/16 1623 12/12/16 1941  BP: (!) 109/92  138/64 (!) 114/54  Pulse: (!) 58  72 76  Resp: 17  18 15   Temp: 97.9 F (36.6 C)  98.3 F (36.8 C) 98 F (36.7 C)  TempSrc: Oral  Oral Oral  SpO2: 94% 98% 96%  97%  Weight:      Height:        General: A/O 4, positive acute on chronic respiratory distress Eyes: negative scleral hemorrhage, negative anisocoria, negative icterus ENT: Negative Runny nose, negative gingival bleeding, Neck:  Negative scars, masses, torticollis, lymphadenopathy, JVD Lungs: clear to auscultation bilateral Cardiovascular: Regular rate and rhythm without murmur gallop or rub normal S1 and S2    Discharge Instructions  Discharge Instructions    (HEART FAILURE PATIENTS) Call MD:  Anytime you have any of the following symptoms: 1) 3 pound weight gain in 24 hours or 5 pounds in 1 week 2) shortness of breath, with or without a dry hacking cough 3) swelling in the hands, feet or stomach 4) if you have to sleep on extra pillows at night in order to breathe.    Complete  by:  As directed    Call MD for:  difficulty breathing, headache or visual disturbances    Complete by:  As directed    Call MD for:  persistant dizziness or light-headedness    Complete by:  As directed    Call MD for:  temperature >100.4    Complete by:  As directed    Diet - low sodium heart healthy    Complete by:  As directed    Diet - low sodium heart healthy    Complete by:  As directed    Discharge instructions    Complete by:  As directed    Take medications as prescribed Call if fevers or productive cough You need outpatient sleep study so you can get back on the BiPAP/CPAP   Heart Failure patients record your daily weight using the same scale at the same time of day    Complete by:  As directed    Increase activity slowly    Complete by:  As directed    Increase activity slowly    Complete by:  As directed      Allergies as of 12/12/2016      Reactions   Sulfa Antibiotics Nausea And Vomiting, Other (See Comments)   "Stayed sick"      Medication List    STOP taking these medications   sulfamethoxazole-trimethoprim 800-160 MG tablet Commonly known as:  BACTRIM DS,SEPTRA DS      TAKE these medications   ACCU-CHEK SMARTVIEW test strip Generic drug:  glucose blood   albuterol 108 (90 Base) MCG/ACT inhaler Commonly known as:  PROVENTIL HFA;VENTOLIN HFA Inhale 2 puffs into the lungs every 6 (six) hours as needed for wheezing or shortness of breath.   albuterol (2.5 MG/3ML) 0.083% nebulizer solution Commonly known as:  PROVENTIL Take 3 mLs (2.5 mg total) by nebulization every 6 (six) hours as needed for wheezing or shortness of breath.   alendronate 70 MG tablet Commonly known as:  FOSAMAX Take 70 mg by mouth once a week. Take with a full glass of water on an empty stomach.   amLODipine 2.5 MG tablet Commonly known as:  NORVASC Take 1 tablet (2.5 mg total) by mouth daily.   arformoterol 15 MCG/2ML Nebu Commonly known as:  BROVANA Take 2 mLs (15 mcg total) by nebulization 2 (two) times daily.   aspirin 81 MG tablet Take 81 mg by mouth daily.   budesonide 0.5 MG/2ML nebulizer solution Commonly known as:  PULMICORT Take 2 mLs (0.5 mg total) by nebulization 2 (two) times daily.   Calcium Carbonate-Vitamin D 600-400 MG-UNIT tablet Take 1 tablet by mouth 2 (two) times daily.   cyanocobalamin 500 MCG tablet Take 500 mcg by mouth daily.   DULERA 200-5 MCG/ACT Aero Generic drug:  mometasone-formoterol Inhale 2 puffs into the lungs 2 (two) times daily.   escitalopram 20 MG tablet Commonly known as:  LEXAPRO Take 20 mg by mouth at bedtime.   furosemide 40 MG tablet Commonly known as:  LASIX Take 1 tablet (40 mg total) by mouth 2 (two) times daily. What changed:  medication strength  how much to take  when to take this   guaiFENesin 600 MG 12 hr tablet Commonly known as:  MUCINEX Take 1 tablet (600 mg total) by mouth 2 (two) times daily. What changed:  when to take this  reasons to take this   ipratropium-albuterol 0.5-2.5 (3) MG/3ML Soln Commonly known as:  DUONEB Take 3 mLs by nebulization every 4 (  four) hours as needed (for  breathing).   levalbuterol 1.25 MG/0.5ML nebulizer solution Commonly known as:  XOPENEX Take 1.25 mg by nebulization every 8 (eight) hours as needed for wheezing or shortness of breath.   LINZESS 145 MCG Caps capsule Generic drug:  linaclotide Take 145 mcg by mouth daily before breakfast.   LORazepam 1 MG tablet Commonly known as:  ATIVAN Take 1 tablet (1 mg total) by mouth every 8 (eight) hours as needed for anxiety.   metoprolol 50 MG tablet Commonly known as:  LOPRESSOR Take 1 tablet (50 mg total) by mouth 2 (two) times daily. What changed:  medication strength  how much to take   omeprazole 40 MG capsule Commonly known as:  PRILOSEC Take 1 capsule by mouth daily.   ondansetron 4 MG tablet Commonly known as:  ZOFRAN Take 1 tablet (4 mg total) by mouth every 6 (six) hours as needed for nausea.   oxyCODONE-acetaminophen 5-325 MG tablet Commonly known as:  PERCOCET/ROXICET Take 1 tablet by mouth every 8 (eight) hours as needed for moderate pain. What changed:  when to take this   potassium chloride SA 20 MEQ tablet Commonly known as:  K-DUR,KLOR-CON Take 1 tablet (20 mEq total) by mouth daily. What changed:  when to take this   predniSONE 10 MG tablet Commonly known as:  DELTASONE Take 40 mg daily for 5 days with FOOD, 20 mg daily for 4 days, 10 mg daily for 3 days then STOP   ranitidine 150 MG tablet Commonly known as:  ZANTAC Take 1 tablet by mouth 2 (two) times daily.   temazepam 15 MG capsule Commonly known as:  RESTORIL Take 1 capsule (15 mg total) by mouth at bedtime as needed for sleep.   Tiotropium Bromide Monohydrate 2.5 MCG/ACT Aers Inhale 2 puffs into the lungs daily.   Vitamin D (Ergocalciferol) 50000 units Caps capsule Commonly known as:  DRISDOL Take 1 capsule by mouth every 7 (seven) days.   VITAMIN D PO Take 5,000 Units by mouth daily.      Allergies  Allergen Reactions  . Sulfa Antibiotics Nausea And Vomiting and Other (See Comments)     "Stayed sick"    Contact information for follow-up providers    Magdalen Spatz, NP Follow up on 12/20/2016.   Specialty:  Pulmonary Disease Why:  10:00am with [place] pulmonary Nurse Pracitioner  Contact information: [address] [place] [address] [phone]        Asencion Noble, MD. Schedule an appointment as soon as possible for a visit in 2 week(s).   Specialty:  Pulmonary Disease Why:  She will need outpatient sleep study Contact information: [address] [place] [address] [phone]        Fransico Him, MD Follow up.   Specialty:  Cardiology Why:  office will call you, if you do not hear from office by Thursday call the office.  Contact information: [id number] [address] [phone]            Contact information for after-discharge care    Destination    [facility] Follow up.   Specialty:  [place] information: [address] [place] [address] [phone]                   The results of significant diagnostics from this hospitalization (including imaging, microbiology, ancillary and laboratory) are listed below for reference.  Significant Diagnostic Studies: Dg Chest Port 1 View  Result Date: 12/12/2016 CLINICAL DATA:  Shortness of breath, COPD EXAM: PORTABLE CHEST 1 VIEW COMPARISON:  Portable chest x-ray of December 09, 2016 FINDINGS: The lungs are well-expanded. There is increased density at the left lung base more conspicuous today. There is mild interstitial prominence on the right also more conspicuous. The cardiac silhouette remains enlarged. The central pulmonary vascularity remains prominent. There is calcification in the wall of the aortic arch. IMPRESSION: COPD. Left basilar subsegmental atelectasis. Mild right interstitial prominence may reflect subsegmental atelectasis or interstitial edema. Stable cardiomegaly.  Thoracic aortic atherosclerosis. Electronically Signed   By: David  Martinique M.D.   On: 12/12/2016 07:14   Dg Chest Port 1 View  Result Date: 12/09/2016 CLINICAL DATA:  Respiratory failure. Pt c/o of SOB, but denies chest pain. EXAM: PORTABLE CHEST 1 VIEW COMPARISON:  12/06/2016 FINDINGS: Stable mild cardiomegaly. No mediastinal or hilar masses. Mild linear atelectasis at the lung bases, also stable. Lungs otherwise clear. No convincing pleural effusion.  No pneumothorax. IMPRESSION: 1. No acute cardiopulmonary disease. 2. Mild persistent lung base atelectasis. Electronically Signed   By: Lajean Manes M.D.   On: 12/09/2016 07:36   Dg Chest Port 1 View  Result Date: 12/06/2016 CLINICAL DATA:  Shortness of breath and coughing. Symptoms are improving. EXAM: PORTABLE CHEST 1 VIEW COMPARISON:  12/03/2016 FINDINGS: Heart size is at the upper limits of normal. Mediastinal shadows are normal. Minimal basilar atelectasis persists, improving. No worsening or new findings. No effusions. No acute bone finding. IMPRESSION: Improving basilar atelectasis . Electronically Signed   By: Nelson Chimes M.D.   On: 12/06/2016 08:09   Dg Chest Port 1 View  Result Date: 12/03/2016 CLINICAL DATA:  Shortness of breath. EXAM: PORTABLE CHEST 1 VIEW COMPARISON:  Chest radiograph 09/26/2016. FINDINGS: Monitoring leads overlie the patient. Stable cardiomegaly. Heterogeneous opacities within the lung bases bilaterally. No pleural effusion or pneumothorax. IMPRESSION: Cardiomegaly. Bibasilar heterogeneous opacities favored to represent atelectasis. Infection not excluded. Electronically Signed   By: Lovey Newcomer M.D.   On: 12/03/2016 16:24    Microbiology: Recent Results (from the past 240 hour(s))  Urine culture     Status: Abnormal   Collection Time: 12/03/16  3:35 PM  Result Value Ref Range Status   Specimen Description URINE, RANDOM  Final   Special Requests NONE  Final   Culture MULTIPLE SPECIES PRESENT, SUGGEST RECOLLECTION  (A)  Final   Report Status 12/05/2016 FINAL  Final  Blood Culture (routine x 2)     Status: None   Collection Time: 12/03/16  3:38 PM  Result Value Ref Range Status   Specimen Description BLOOD LEFT FOREARM  Final   Special Requests IN PEDIATRIC BOTTLE 2CC  Final   Culture NO GROWTH 5 DAYS  Final   Report Status 12/08/2016 FINAL  Final  Blood Culture (routine x 2)     Status: None   Collection Time: 12/03/16  3:45 PM  Result Value Ref Range Status   Specimen Description BLOOD RIGHT ANTECUBITAL  Final   Special Requests IN PEDIATRIC BOTTLE 2CC  Final   Culture NO GROWTH 5 DAYS  Final   Report Status 12/08/2016 FINAL  Final  MRSA PCR Screening     Status: None   Collection Time: 12/03/16  9:45 PM  Result Value Ref Range Status   MRSA by PCR NEGATIVE NEGATIVE Final    Comment:        The GeneXpert MRSA Assay (FDA approved for  NASAL specimens only), is one component of a comprehensive MRSA colonization surveillance program. It is not intended to diagnose MRSA infection nor to guide or monitor treatment for MRSA infections.   Culture, sputum-assessment     Status: None   Collection Time: 12/03/16  9:58 PM  Result Value Ref Range Status   Specimen Description Expect. Sput  Final   Special Requests NONE  Final   Sputum evaluation THIS SPECIMEN IS ACCEPTABLE FOR SPUTUM CULTURE  Final   Report Status 12/05/2016 FINAL  Final  Culture, respiratory (NON-Expectorated)     Status: None   Collection Time: 12/03/16  9:58 PM  Result Value Ref Range Status   Specimen Description Expect. Sput  Final   Special Requests NONE Reflexed from [id number]  Final   Gram Stain   Final    FEW WBC PRESENT, PREDOMINANTLY PMN FEW SQUAMOUS EPITHELIAL CELLS PRESENT MODERATE GRAM POSITIVE COCCOBACILLUS FEW GRAM NEGATIVE COCCOBACILLI    Culture Consistent with normal respiratory flora.  Final   Report Status 12/07/2016 FINAL  Final  Respiratory Panel by PCR     Status: None   Collection Time: 12/04/16  12:21 PM  Result Value Ref Range Status   Adenovirus NOT DETECTED NOT DETECTED Final   Coronavirus 229E NOT DETECTED NOT DETECTED Final   Coronavirus HKU1 NOT DETECTED NOT DETECTED Final   Coronavirus NL63 NOT DETECTED NOT DETECTED Final   Coronavirus OC43 NOT DETECTED NOT DETECTED Final   Metapneumovirus NOT DETECTED NOT DETECTED Final   Rhinovirus / Enterovirus NOT DETECTED NOT DETECTED Final   Influenza A NOT DETECTED NOT DETECTED Final   Influenza B NOT DETECTED NOT DETECTED Final   Parainfluenza Virus 1 NOT DETECTED NOT DETECTED Final   Parainfluenza Virus 2 NOT DETECTED NOT DETECTED Final   Parainfluenza Virus 3 NOT DETECTED NOT DETECTED Final   Parainfluenza Virus 4 NOT DETECTED NOT DETECTED Final   Respiratory Syncytial Virus NOT DETECTED NOT DETECTED Final   Bordetella pertussis NOT DETECTED NOT DETECTED Final   Chlamydophila pneumoniae NOT DETECTED NOT DETECTED Final   Mycoplasma pneumoniae NOT DETECTED NOT DETECTED Final     Labs: Basic Metabolic Panel:  Recent Labs Lab 12/06/16 0300 12/07/16 0337 12/08/16 0257 12/09/16 1410 12/11/16 1519 12/12/16 0528  NA 139 140 138 139 138 139  K 4.7 4.2 3.9 4.2 4.7 3.7  CL 93* 90* 87* 85* 85* 86*  CO2 44* 40* 42* 41* 42* 41*  GLUCOSE 185* 106* 253* 173* 240* 117*  BUN 32* 32* 31* 29* 38* 37*  CREATININE 1.91* 1.84* 1.70* 1.58* 2.24* 2.07*  CALCIUM 8.8* 9.2 8.3* 8.3* 8.9 9.1  MG 2.2 2.2 2.1  --   --   --    Liver Function Tests: No results for input(s): AST, ALT, ALKPHOS, BILITOT, PROT, ALBUMIN in the last 168 hours. No results for input(s): LIPASE, AMYLASE in the last 168 hours. No results for input(s): AMMONIA in the last 168 hours. CBC:  Recent Labs Lab 12/06/16 0300 12/07/16 0337 12/08/16 0257  WBC 8.2 8.6 6.7  NEUTROABS 7.4 5.9 5.9  HGB 8.7* 9.5* 9.5*  HCT 30.2* 33.3* 32.6*  MCV 84.8 85.8 84.9  PLT 300 338 315   Cardiac Enzymes: No results for input(s): CKTOTAL, CKMB, CKMBINDEX, TROPONINI in the last 168  hours. BNP: BNP (last 3 results)  Recent Labs  12/03/16 1545  BNP 165.1*    ProBNP (last 3 results) No results for input(s): PROBNP in the last 8760 hours.  CBG:  Recent Labs Lab  12/08/16 0806 12/09/16 0824 12/10/16 0741 12/10/16 1153 12/12/16 0852  GLUCAP 174* 161* 179* 186* 148*       Signed:  Dia Crawford, MD [facility] [phone] pager

## 2016-12-12 NOTE — Progress Notes (Signed)
Patient Name: Amanda Horton Date of Encounter: 12/12/2016  Primary Cardiologist: Dr. Sundra Aland Problem List     Principal Problem:   Acute on chronic respiratory failure with hypoxia [facility]) Active Problems:   GERD (gastroesophageal reflux disease)   OSA (obstructive sleep apnea)   Essential hypertension   COPD exacerbation (HCC)   Sepsis due to pneumonia (HCC)   Leukocytosis   Troponin level elevated   Chronic combined systolic and diastolic CHF (congestive heart failure) (HCC)   Acute renal failure superimposed on stage 3 chronic kidney disease (HCC)   Anemia of chronic kidney failure   Metabolic alkalosis   Anxiety and depression   Elevated troponin   Anemia of chronic kidney failure, stage 4 (severe) (HCC)   Venous stasis ulcer limited to breakdown of skin with varicose veins (HCC)   Stasis ulcer (HCC)   Shortness of breath   Hypoxia     Subjective   No complaints, feels better, no chest pain, no SOB.  Did ambulate some last pm  Inpatient Medications    Scheduled Meds: . amLODipine  2.5 mg Oral Daily  . arformoterol  15 mcg Nebulization BID  . aspirin  81 mg Oral Daily  . budesonide (PULMICORT) nebulizer solution  0.5 mg Nebulization BID  . calcium-vitamin D  1 tablet Oral BID WC  . docusate sodium  100 mg Oral Daily  . escitalopram  20 mg Oral QHS  . famotidine  20 mg Oral Daily  . heparin subcutaneous  5,000 Units Subcutaneous Q8H  . ipratropium-albuterol  3 mL Nebulization TID  . linaclotide  145 mcg Oral QAC breakfast  . mouth rinse  15 mL Mouth Rinse BID  . metoprolol tartrate  50 mg Oral BID  . pantoprazole  40 mg Oral Daily  . potassium chloride SA  20 mEq Oral BID  . predniSONE  40 mg Oral Q breakfast  . sodium chloride flush  3 mL Intravenous Q12H  . cyanocobalamin  500 mcg Oral Daily  . Vitamin D (Ergocalciferol)  50,000 Units Oral Q7 days   Continuous Infusions: . sodium chloride 10 mL/hr at 12/03/16 2239   PRN Meds: guaiFENesin,  levalbuterol, LORazepam, ondansetron **OR** ondansetron (ZOFRAN) IV, oxyCODONE-acetaminophen, temazepam   Vital Signs    Vitals:   12/12/16 0743 12/12/16 0746 12/12/16 0844 12/12/16 1136  BP:   (!) 92/58 (!) 109/92  Pulse:   77 (!) 58  Resp:   (!) 21 17  Temp:    97.9 F (36.6 C)  TempSrc:    Oral  SpO2: 99% 99% 91% 94%  Weight:      Height:        Intake/Output Summary (Last 24 hours) at 12/12/16 1159 Last data filed at 12/12/16 0910  Gross per 24 hour  Intake              780 ml  Output             1000 ml  Net             -220 ml   Filed Weights   12/11/16 0428 12/12/16 0500 12/12/16 0508  Weight: 107 kg (235 lb 12.8 oz) 107.7 kg (237 lb 6.4 oz) 107.7 kg (237 lb 6.4 oz)    Physical Exam    GEN: Well nourished,  in no acute distress.  HEENT: normocephalic, sclera clear, mucus membranes moist.  Neck: Supple, no JVD, or masses. Cardiac: RRR, no murmurs, rubs, or gallops. No clubbing, cyanosis, edema.  Radials/DP/PT 2+ and equal bilaterally.  Respiratory:  Respirations regular and unlabored, breath sounds bilaterally though diminished in the bases,  without rales,+ rhonchi no wheezes. GI: Abd -Soft, nontender, nondistended, BS + x 4. MS: no deformity or atrophy. Skin: warm and dry, brisk capillary refill, scabbed ulcer Lt lower ext.  Rt lower ext wrapped.  Legs with no edema-  Neuro:  Alert and oriented X 3 MAE, follows commands Psych: answers questions appropriately,Normal and pleasant affect.   Labs    CBC No results for input(s): WBC, NEUTROABS, HGB, HCT, MCV, PLT in the last 72 hours. Basic Metabolic Panel  Recent Labs  12/11/16 1519 12/12/16 0528  NA 138 139  K 4.7 3.7  CL 85* 86*  CO2 42* 41*  GLUCOSE 240* 117*  BUN 38* 37*  CREATININE 2.24* 2.07*  CALCIUM 8.9 9.1   Liver Function Tests No results for input(s): AST, ALT, ALKPHOS, BILITOT, PROT, ALBUMIN in the last 72 hours. No results for input(s): LIPASE, AMYLASE in the last 72 hours. Cardiac  Enzymes No results for input(s): CKTOTAL, CKMB, CKMBINDEX, TROPONINI in the last 72 hours. BNP Invalid input(s): POCBNP D-Dimer No results for input(s): DDIMER in the last 72 hours. Hemoglobin A1C No results for input(s): HGBA1C in the last 72 hours. Fasting Lipid Panel No results for input(s): CHOL, HDL, LDLCALC, TRIG, CHOLHDL, LDLDIRECT in the last 72 hours. Thyroid Function Tests No results for input(s): TSH, T4TOTAL, T3FREE, THYROIDAB in the last 72 hours.  Invalid input(s): FREET3  Telemetry    SR to ST - Personally Reviewed  ECG    No new since the v2nd - Personally Reviewed  Radiology    Dg Chest Port 1 View  Result Date: 12/12/2016 CLINICAL DATA:  Shortness of breath, COPD EXAM: PORTABLE CHEST 1 VIEW COMPARISON:  Portable chest x-ray of December 09, 2016 FINDINGS: The lungs are well-expanded. There is increased density at the left lung base more conspicuous today. There is mild interstitial prominence on the right also more conspicuous. The cardiac silhouette remains enlarged. The central pulmonary vascularity remains prominent. There is calcification in the wall of the aortic arch. IMPRESSION: COPD. Left basilar subsegmental atelectasis. Mild right interstitial prominence may reflect subsegmental atelectasis or interstitial edema. Stable cardiomegaly. Thoracic aortic atherosclerosis. Electronically Signed   By: Hurbert Duran  [place] M.D.   On: 12/12/2016 07:14    Cardiac Studies   Echo 12/05/16 ------------------------------------------------------------------- Study Conclusions  - Left ventricle: The cavity size was normal. Wall thickness was   increased in a pattern of moderate LVH. The estimated ejection   fraction was 55%. Septal-lateral dyssynchrony. The septum appears   hypokinetic. Doppler parameters are consistent with abnormal left   ventricular relaxation (grade 1 diastolic dysfunction). - Aortic valve: There was no stenosis. - Mitral valve: There was no significant  regurgitation. - Right ventricle: The cavity size was normal. Systolic function   was normal. - Pulmonary arteries: No complete TR doppler jet so unable to   estimate PA systolic pressure. - Systemic veins: IVC measured 2.6 cm with < 50% respirophasic   variation, suggesting RA pressure 15 mmHg.  Impressions:  - Technically difficult study with poor acoustic windows. Normal LV   size with moderate LV hypertrophy. EF 55%. Septal-lateral   dyssynchrony with septal hypokinesis. Normal RV size and systolic   function. No significant valvular abnormalities.   Patient Profile     71yo obese female with a history of chronic diastolic/systolic CHF with EF [id number] (now EF 55% by recent echo), CKD  stage 3, HTN, severe oxygen dependent COPD and morbid obesity who presented with increased SOB, hoarseness, nonproductive cough and declining oxygen saturations in the low 80s on day of admission. On admission she initially required BiPAP. CXRAY with bibasilar atelectasis but no frank edema but her BNp was mildly elevated. She was given IV lasix and has diuresed 1.5L with improved creatinine. She has not had any chest pain and no change in her chronic LE edema but has noticed some increase in abdominal girth. Unfortunately she does not weigh herself at home  [place]    1. Dyspnea/Acute on chronic diastolic HF?:Presented with multiple days of worsening dyspnea. Developed a fever prior to admission with nonproductive cough. CXR on admission showed bilateral atelectasis that has improved since admission. She was given IV lasix with 7.4L net neg UOP thus far. Diuresed 2060 ml yesterday.  She is neg. 9,477 since admit.  wt is down from pk of 254 to 235 lbs.  Unsure of her baseline dry weight. However her creatinine increased with increased BUN yesterday. Lasix was held yesterday. * Echo showed slightly improved EF to 55% from 45% back in 9/17. Was given IV antibiotics on admission for sepsis and  elevated WBC with some improvement. Reports her breathing is somewhat better but not at her baseline.  * Appears euvolemic on exam  As the renal function has improved some with holding Lasix, I would recommend restarting her home dose of oral Lasix 80 mg by mouth daily tomorrow. Plan would then be onceonce daily dosing as home regimen with PRN additional dose for Wgt gain > 3 Lb in 1 day or greater than 5 pounds in 1 week.  2. Arrhythmia: Noted to have abnormal beats on telemetry. Per Dr. Radford Pax "There was some concern about possible PAF on monitor but all telemetry strips revealed and no obvious PAF. There is a lot of wandering baseline artifact."   Does have hx of OSA. Currently in a SR on tele with a few episodes of nonsustained atrial tachycardia.   Beta blocker increased to 50 mg BID yesterday. Less frequent PVCs noted. No PAF noted.  3. HTN:BP much improved and controlled. Now blood pressure borderline low with increased dose of beta blocker. As the beta blocker is more beneficial, would stop amlodipine.    4. COPD:Reports she was dx in 2007 and was followed by pulmonary for a time, but has not been seen for awhile. Normally wears 2L, -- seen by [facility]. Plan for outpatient follow-up. Also recommended nighttime BiPAP  5. Anemia:Hgb 11.1 back in 9/17, now 9.5. Denies any active bleeding, could be contributing to dyspnea.    There is a the plan is for her to be discharged to skilled nursing facility today. We'll arrange follow-up with Dr. Radford Pax.   Glenetta Hew, M.D., M.S. Interventional Cardiologist   Pager # [phone] Phone # [phone] [address] [place], [place] [address]

## 2016-12-12 NOTE — Progress Notes (Signed)
Tech offered Pt a bath. Pt asked tech where was she (herself) going, tech replied to Pt you will stay here in the hospital but would you like to freshen up. Pt responded with no and she was sleepy.

## 2016-12-12 NOTE — Progress Notes (Addendum)
Critical ABG called to RN.  PH-7.448 CO2- 68.7 PO2-60.2 HCO3-46.8 SPO2-90.5%  With patient on CPAP of 8 and 2 LPM

## 2016-12-12 NOTE — Progress Notes (Signed)
Physical Therapy Treatment Patient Details Name: Timika Setaro MRN: [id number] DOB: 22-Mar-1946 Today's Date: 12/12/2016    History of Present Illness Darnesha Bemis is a 71 y.o. female with medical history significant for COPD on 2 L oxygen at home, OSA, CHF , CKD stage 3, HTN, depression and anxiety. She presented to [facility] with worsening shortness of breath at rest and with exertion over past few days prior to this admission    PT Comments    Pt improving endurance, able to return spO2 to >90% after gait with only 30 seconds seated rest.  Progressed to supervision with gait for short distances.  Follow Up Recommendations  SNF     Equipment Recommendations  None recommended by PT    Recommendations for Other Services       Precautions / Restrictions Precautions Precautions: Fall Precaution Comments: monitor sats and HR Restrictions Weight Bearing Restrictions: No    Mobility  Bed Mobility Overal bed mobility: Modified Independent             General bed mobility comments: able to use handrails with HOB raised to get OOB without assistance  Transfers   Equipment used: Rolling walker (2 wheeled) Transfers: Sit to/from Stand Sit to Stand: Min guard         General transfer comment: initially min guard, able to progress to supervision with repetition throughout session. able to sti to stand from [facility], recliner and bed  Ambulation/Gait Ambulation/Gait assistance: Supervision Ambulation Distance (Feet): 30 Feet (x 2) Assistive device: Rolling walker (2 wheeled) Gait Pattern/deviations: Step-through pattern Gait velocity: Decreased   General Gait Details: Pt able to gait with slow cadence wtih supervision, no LOB, cues for pursed lip breathing throughout.  spO2 85% after gait, able to return to >90% with 30 seconds seated rest   Stairs            Wheelchair Mobility    Modified Rankin (Stroke Patients Only)       Balance               Standing  balance comment: able to stand for hygiene after toileting with 1 UE support with supervision                    Cognition Arousal/Alertness: Awake/alert Behavior During Therapy: WFL for tasks assessed/performed Overall Cognitive Status: Within Functional Limits for tasks assessed                      Exercises      General Comments        Pertinent Vitals/Pain Pain Assessment: No/denies pain    Home Living                      Prior Function            PT Goals (current goals can now be found in the care plan section) Progress towards PT goals: Progressing toward goals    Frequency    Min 3X/week      PT Plan Current plan remains appropriate    Co-evaluation             End of Session Equipment Utilized During Treatment: Oxygen Activity Tolerance: Patient tolerated treatment well Patient left: in chair;with call bell/phone within reach     Time: 0802-0826 PT Time Calculation (min) (ACUTE ONLY): 24 min  Charges:  $Therapeutic Activity: 23-37 mins  G Codes:      Keynan Heffern 29-Dec-2016, 9:34 AM

## 2016-12-12 NOTE — Clinical Social Work Note (Signed)
CSW notes that the patient was not discharged yesterday. Per RN the patient's creatinine was elevated. Facility states that the patient's SNF authorization with insurance is only valid through 1/10. If the patient does not DC by 1/10 a new auth will have to be received, which is unlikely since the patient is only requiring supervision at this time. CSW will follow.   Liz Beach MSW, [place], [place], JI:[id number]

## 2016-12-13 DIAGNOSIS — I5042 Chronic combined systolic (congestive) and diastolic (congestive) heart failure: Secondary | ICD-10-CM | POA: Diagnosis not present

## 2016-12-13 DIAGNOSIS — J189 Pneumonia, unspecified organism: Secondary | ICD-10-CM | POA: Diagnosis not present

## 2016-12-13 DIAGNOSIS — I509 Heart failure, unspecified: Secondary | ICD-10-CM | POA: Diagnosis not present

## 2016-12-13 DIAGNOSIS — M899 Disorder of bone, unspecified: Secondary | ICD-10-CM | POA: Diagnosis not present

## 2016-12-13 DIAGNOSIS — R0602 Shortness of breath: Secondary | ICD-10-CM | POA: Diagnosis not present

## 2016-12-13 DIAGNOSIS — I83009 Varicose veins of unspecified lower extremity with ulcer of unspecified site: Secondary | ICD-10-CM | POA: Diagnosis not present

## 2016-12-13 DIAGNOSIS — R748 Abnormal levels of other serum enzymes: Secondary | ICD-10-CM | POA: Diagnosis not present

## 2016-12-13 DIAGNOSIS — J9600 Acute respiratory failure, unspecified whether with hypoxia or hypercapnia: Secondary | ICD-10-CM | POA: Diagnosis not present

## 2016-12-13 DIAGNOSIS — I10 Essential (primary) hypertension: Secondary | ICD-10-CM | POA: Diagnosis not present

## 2016-12-13 DIAGNOSIS — J441 Chronic obstructive pulmonary disease with (acute) exacerbation: Secondary | ICD-10-CM | POA: Diagnosis not present

## 2016-12-13 DIAGNOSIS — J9601 Acute respiratory failure with hypoxia: Secondary | ICD-10-CM | POA: Diagnosis not present

## 2016-12-13 DIAGNOSIS — I471 Supraventricular tachycardia: Secondary | ICD-10-CM | POA: Diagnosis not present

## 2016-12-13 DIAGNOSIS — M47817 Spondylosis without myelopathy or radiculopathy, lumbosacral region: Secondary | ICD-10-CM | POA: Diagnosis not present

## 2016-12-13 DIAGNOSIS — Z9181 History of falling: Secondary | ICD-10-CM | POA: Diagnosis not present

## 2016-12-13 DIAGNOSIS — M6281 Muscle weakness (generalized): Secondary | ICD-10-CM | POA: Diagnosis not present

## 2016-12-13 DIAGNOSIS — N170 Acute kidney failure with tubular necrosis: Secondary | ICD-10-CM | POA: Diagnosis not present

## 2016-12-13 DIAGNOSIS — R262 Difficulty in walking, not elsewhere classified: Secondary | ICD-10-CM | POA: Diagnosis not present

## 2016-12-13 DIAGNOSIS — I493 Ventricular premature depolarization: Secondary | ICD-10-CM | POA: Diagnosis not present

## 2016-12-13 DIAGNOSIS — G4733 Obstructive sleep apnea (adult) (pediatric): Secondary | ICD-10-CM | POA: Diagnosis not present

## 2016-12-13 DIAGNOSIS — F329 Major depressive disorder, single episode, unspecified: Secondary | ICD-10-CM | POA: Diagnosis not present

## 2016-12-13 DIAGNOSIS — J449 Chronic obstructive pulmonary disease, unspecified: Secondary | ICD-10-CM | POA: Diagnosis not present

## 2016-12-13 DIAGNOSIS — I5033 Acute on chronic diastolic (congestive) heart failure: Secondary | ICD-10-CM | POA: Diagnosis not present

## 2016-12-13 DIAGNOSIS — R0902 Hypoxemia: Secondary | ICD-10-CM | POA: Diagnosis not present

## 2016-12-13 DIAGNOSIS — N183 Chronic kidney disease, stage 3 (moderate): Secondary | ICD-10-CM | POA: Diagnosis not present

## 2016-12-13 DIAGNOSIS — F419 Anxiety disorder, unspecified: Secondary | ICD-10-CM | POA: Diagnosis not present

## 2016-12-13 DIAGNOSIS — J9621 Acute and chronic respiratory failure with hypoxia: Secondary | ICD-10-CM | POA: Diagnosis not present

## 2016-12-13 LAB — BASIC METABOLIC PANEL
Anion gap: 11 (ref 5–15)
BUN: 37 mg/dL — AB (ref 6–20)
CALCIUM: 9.2 mg/dL (ref 8.9–10.3)
CO2: 38 mmol/L — AB (ref 22–32)
CREATININE: 1.93 mg/dL — AB (ref 0.44–1.00)
Chloride: 89 mmol/L — ABNORMAL LOW (ref 101–111)
GFR calc Af Amer: 29 mL/min — ABNORMAL LOW (ref >60)
GFR calc non Af Amer: 25 mL/min — ABNORMAL LOW (ref >60)
Glucose, Bld: 137 mg/dL — ABNORMAL HIGH (ref 65–99)
Potassium: 4.8 mmol/L (ref 3.5–5.1)
SODIUM: 138 mmol/L (ref 135–145)

## 2016-12-13 LAB — GLUCOSE, CAPILLARY: Glucose-Capillary: 120 mg/dL — ABNORMAL HIGH (ref 65–99)

## 2016-12-13 MED ORDER — IPRATROPIUM-ALBUTEROL 0.5-2.5 (3) MG/3ML IN SOLN: 3.0000 mL | Freq: Two times a day (BID) | RESPIRATORY_TRACT | Status: DC

## 2016-12-13 MED ORDER — OXYCODONE-ACETAMINOPHEN 5-325 MG PO TABS: 1.0000 | ORAL_TABLET | Freq: Three times a day (TID) | ORAL | 0 refills | Status: AC | PRN

## 2016-12-13 MED ORDER — FUROSEMIDE 40 MG PO TABS: 80.0000 mg | ORAL_TABLET | Freq: Every day | ORAL | 1 refills | Status: DC

## 2016-12-13 MED ORDER — LORAZEPAM 1 MG PO TABS: 1.0000 mg | ORAL_TABLET | Freq: Three times a day (TID) | ORAL | 0 refills | Status: DC | PRN

## 2016-12-13 MED ORDER — TEMAZEPAM 15 MG PO CAPS: 15.0000 mg | ORAL_CAPSULE | Freq: Every evening | ORAL | 0 refills | Status: DC | PRN

## 2016-12-13 MED ORDER — FUROSEMIDE 80 MG PO TABS: 80.0000 mg | ORAL_TABLET | Freq: Every day | ORAL | Status: DC

## 2016-12-13 NOTE — Progress Notes (Signed)
Pt stated she was not ready for BIPAP at this time. RT will check with patient again later.

## 2016-12-13 NOTE — Progress Notes (Signed)
Clinical Social Worker spoke with patients physician and Dr.Krishnan will be discharging patient today. CSW reached out to admissions coordinator and she stated patient still has a bed available for today. Admissions Coordinator stated she has to reach out to [facility] and get authorization for SNF placement. Coordinator will contact CSW with authorization. CSW remains available for support and discharge needs.   Rhea Pink, MSW,  [place]

## 2016-12-13 NOTE — Progress Notes (Signed)
Patient Name: Amanda Horton Date of Encounter: 12/13/2016  Primary Cardiologist: Dr. Radford Pax  Patient Profile     71yo obese female with a history of chronic diastolic/systolic CHF with EF [id number] (now EF 55% by recent echo), CKD stage 3, HTN, severe oxygen dependent COPD and morbid obesity who presented with increased SOB, hoarseness, nonproductive cough and declining oxygen saturations in the low 80s on day of admission. On admission she initially required BiPAP. CXRAY with bibasilar atelectasis but no frank edema but her BNp was mildly elevated. She was given IV lasix and has diuresed 1.5L with improved creatinine. She has not had any chest pain and no change in her chronic LE edema but has noticed some increase in abdominal girth. Unfortunately she does not weigh herself at home. Appears to have a lobar pneumonia on chest x-ray. Improving with treatment using antibiotics.  Subjective   No complaints, feels better, no chest pain, no SOB.  Did ambulate some last pm  Inpatient Medications    Scheduled Meds: . arformoterol  15 mcg Nebulization BID  . aspirin  81 mg Oral Daily  . budesonide (PULMICORT) nebulizer solution  0.5 mg Nebulization BID  . calcium-vitamin D  1 tablet Oral BID WC  . docusate sodium  100 mg Oral Daily  . escitalopram  20 mg Oral QHS  . famotidine  20 mg Oral Daily  . [START ON 12/14/2016] furosemide  80 mg Oral Daily  . heparin subcutaneous  5,000 Units Subcutaneous Q8H  . ipratropium-albuterol  3 mL Nebulization BID  . linaclotide  145 mcg Oral QAC breakfast  . mouth rinse  15 mL Mouth Rinse BID  . metoprolol tartrate  50 mg Oral BID  . pantoprazole  40 mg Oral Daily  . potassium chloride SA  20 mEq Oral BID  . predniSONE  40 mg Oral Q breakfast  . sodium chloride flush  3 mL Intravenous Q12H  . cyanocobalamin  500 mcg Oral Daily  . Vitamin D (Ergocalciferol)  50,000 Units Oral Q7 days   Continuous Infusions: . sodium chloride 10 mL/hr at 12/03/16 2239    PRN Meds: guaiFENesin, levalbuterol, LORazepam, ondansetron **OR** ondansetron (ZOFRAN) IV, oxyCODONE-acetaminophen, temazepam   Vital Signs    Vitals:   12/13/16 0018 12/13/16 0500 12/13/16 0758 12/13/16 0805  BP: 114/60 125/65 (!) 102/55   Pulse: 66 (!) 57 64   Resp: 16 16 19    Temp: 97.6 F (36.4 C) 97.3 F (36.3 C) 98.1 F (36.7 C)   TempSrc: Oral Oral Oral   SpO2: 100% 100% 99% 98%  Weight:      Height:        Intake/Output Summary (Last 24 hours) at 12/13/16 1055 Last data filed at 12/13/16 0900  Gross per 24 hour  Intake              660 ml  Output                0 ml  Net              660 ml   Filed Weights   12/11/16 0428 12/12/16 0500 12/12/16 0508  Weight: 107 kg (235 lb 12.8 oz) 107.7 kg (237 lb 6.4 oz) 107.7 kg (237 lb 6.4 oz)    Physical Exam    GEN: Well nourished,  in no acute distress.  HEENT: normocephalic, sclera clear, mucus membranes moist.  Neck: Supple, no JVD, or masses. Cardiac: RRR, no murmurs, rubs, or gallops. No clubbing, cyanosis,  edema.  Radials/DP/PT 2+ and equal bilaterally.  Respiratory:  Respirations regular and unlabored, breath sounds bilaterally though diminished in the bases,  without rales,+ rhonchi no wheezes. GI: Abd -Soft, nontender, nondistended, BS + x 4. MS: no deformity or atrophy. Skin: warm and dry, brisk capillary refill, scabbed ulcer Lt lower ext.  Rt lower ext wrapped.  Legs with no edema-  Neuro:  Alert and oriented X 3 MAE, follows commands Psych: answers questions appropriately,Normal and pleasant affect.   Labs    CBC No results for input(s): WBC, NEUTROABS, HGB, HCT, MCV, PLT in the last 72 hours. Basic Metabolic Panel  Recent Labs  12/11/16 1519 12/12/16 0528  NA 138 139  K 4.7 3.7  CL 85* 86*  CO2 42* 41*  GLUCOSE 240* 117*  BUN 38* 37*  CREATININE 2.24* 2.07*  CALCIUM 8.9 9.1   Liver Function Tests No results for input(s): AST, ALT, ALKPHOS, BILITOT, PROT, ALBUMIN in the last 72  hours. No results for input(s): LIPASE, AMYLASE in the last 72 hours. Cardiac Enzymes No results for input(s): CKTOTAL, CKMB, CKMBINDEX, TROPONINI in the last 72 hours. BNP Invalid input(s): POCBNP D-Dimer No results for input(s): DDIMER in the last 72 hours. Hemoglobin A1C No results for input(s): HGBA1C in the last 72 hours. Fasting Lipid Panel No results for input(s): CHOL, HDL, LDLCALC, TRIG, CHOLHDL, LDLDIRECT in the last 72 hours. Thyroid Function Tests No results for input(s): TSH, T4TOTAL, T3FREE, THYROIDAB in the last 72 hours.  Invalid input(s): FREET3  Telemetry    Normal sinus rhythm in 60s. Minimal PACs PVCs - Personally Reviewed  ECG    No new since the v2nd - Personally Reviewed  Radiology    Dg Chest Port 1 View  Result Date: 12/12/2016 CLINICAL DATA:  Shortness of breath, COPD EXAM: PORTABLE CHEST 1 VIEW COMPARISON:  Portable chest x-ray of December 09, 2016 FINDINGS: The lungs are well-expanded. There is increased density at the left lung base more conspicuous today. There is mild interstitial prominence on the right also more conspicuous. The cardiac silhouette remains enlarged. The central pulmonary vascularity remains prominent. There is calcification in the wall of the aortic arch. IMPRESSION: COPD. Left basilar subsegmental atelectasis. Mild right interstitial prominence may reflect subsegmental atelectasis or interstitial edema. Stable cardiomegaly. Thoracic aortic atherosclerosis. Electronically Signed   By: Belkys Henault  [place] M.D.   On: 12/12/2016 07:14    Cardiac Studies   Echo 12/05/16 ------------------------------------------------------------------- - Left ventricle: Normal LV size and function. Moderate LVH. EF roughly 55% with septal-lateral dyssynchrony. Septum appears hypokinetic. GR 1 DD. - Aortic valve: There was no stenosis. - Mitral valve: There was no significant regurgitation. - Right ventricle: The cavity size was normal. Systolic function was  normal. - Pulmonary arteries: No complete TR doppler jet so unable to estimate PA systolic pressure. - Systemic veins: IVC measured 2.6 cm with < 50% respirophasic variation, suggesting RA pressure 15 mmHg.  Impressions: Technically difficult study with poor acoustic windows. Normal LV size with moderate LV hypertrophy. EF 55%. Septal-lateral dyssynchrony with septal hypokinesis. Normal RV size and systolic function. No significant valvular abnormalities.    Assessment & Plan    1. Dyspnea/Acute on chronic diastolic HF?:Net I/O 9L (but no longer recording -- had 2 Urine "occurences" yesterday) * Echo showed slightly improved EF to 55% from 45% back in 9/17. Was given IV antibiotics on admission for sepsis and elevated WBC with some improvement. Reports her breathing is somewhat better but not at her baseline.  *  Appears euvolemic on exam  Renal function has improved some with holding Lasix,  I would recommend restarting her home dose of oral Lasix 80 mg by mouth daily tomorrow. Plan would then be onceonce daily dosing as home regimen with PRN additional dose for Wgt gain > 3 Lb in 1 day or greater than 5 pounds in 1 week.  I have re-ordered PO Lasix 80 mg to start Tomorrow.  BMP checked today.    2. Arrhythmia: Noted to have abnormal beats on telemetry. Per Dr. Radford Pax "There was some concern about possible PAF on monitor but all telemetry strips revealed and no obvious PAF. There is a lot of wandering baseline artifact."   Does have hx of OSA. Currently in a SR on tele with a few episodes of nonsustained atrial tachycardia.   Beta blocker increased to 50 mg BID. Less frequent PVCs noted. No PAF noted.  3. HTN:BP much improved and controlled. - CCB stopped with increased BB.  4. COPD:Reports she was dx in 2007 and was followed by pulmonary for a time, but has not been seen for awhile. Normally wears 2L, -- seen by [facility] M. Plan for outpatient follow-up. Also recommended nighttime  BiPAP  5. Anemia:Hgb 11.1 back in 9/17, now 9.5. Denies any active bleeding, could be contributing to dyspnea.  CBC not checked.   There is a the plan is for her to be discharged to skilled nursing facility when bed available. Signing off. We'll arrange follow-up with Dr. Radford Pax.   Glenetta Hew, M.D., M.S. Interventional Cardiologist   Pager # [phone] Phone # [phone] [address]. [place], [place] [address]

## 2016-12-13 NOTE — Progress Notes (Signed)
Clinical Social Worker facilitated patient discharge including contacting patient family and facility to confirm patient discharge plans.  Clinical information faxed to facility and family agreeable with plan.  CSW arranged ambulance transport via [facility] to [facility] .  RN Merrily Pew to call [phone] and ask for the nurse covering bed 208 for report prior to discharge.  Clinical Social Worker will sign off for now as social work intervention is no longer needed. Please consult [place] again if new need arises.  Rhea Pink, MSW, [place]

## 2016-12-13 NOTE — Discharge Summary (Signed)
[facility]  Physician Discharge Summary   Patient ID: Amanda Horton MRN: [id number] DOB/AGE: 05-10-1946 71 y.o.  Admit date: 12/03/2016 Discharge date: 12/13/2016  PCP: Nicoletta Dress, MD  DISCHARGE DIAGNOSES:  Principal Problem:   Acute on chronic respiratory failure with hypoxia (HCC) Active Problems:   GERD (gastroesophageal reflux disease)   OSA (obstructive sleep apnea)   Essential hypertension   COPD exacerbation (HCC)   Sepsis due to pneumonia (HCC)   Leukocytosis   Troponin level elevated   Chronic combined systolic and diastolic CHF (congestive heart failure) (HCC)   Acute renal failure superimposed on stage 3 chronic kidney disease (HCC)   Anemia of chronic kidney failure   Metabolic alkalosis   Anxiety and depression   Elevated troponin   Anemia of chronic kidney failure, stage 4 (severe) (HCC)   Venous stasis ulcer limited to breakdown of skin with varicose veins (HCC)   Stasis ulcer (HCC)   Shortness of breath   Hypoxia   Acute respiratory failure with hypoxia and hypercapnia (HCC)   RECOMMENDATIONS FOR OUTPATIENT FOLLOW UP: 1. Check basic metabolic panel on Monday, Jan 15th. 2. See wound care instructions below.   DISCHARGE CONDITION: fair  Diet recommendation: Heart healthy  Filed Weights   12/11/16 0428 12/12/16 0500 12/12/16 0508  Weight: 107 kg (235 lb 12.8 oz) 107.7 kg (237 lb 6.4 oz) 107.7 kg (237 lb 6.4 oz)    INITIAL HISTORY: 71 y.o.WFPMHxDepression and Anxiety COPD on2 L oxygen at home, OSA, Combined chronic Diastolic and Systolic CHF (last 2 D ECHO in 08/2016 with EF 45% and grade 1 DD), CKD stage 3(baseline Cr 1.2 in 08/2016), HTN, HLD.  She presented to [facility] with worsening shortness of breath at rest and with exertion over past few days prior to this admission. She was seen in urgent care where herr oxygen saturation was in mid 80's and for this reason she was referred to ED for further evaluation. During this  hospitalization patient was treated for acute on chronic respiratory failure with hypoxia secondary to noncompliance with CHF regimen, acute renal failure on CKD stage III, and COPD exacerbation.   Consultations:  Cardiology  Procedures: Transthoracic echocardiogram Study Conclusions  - Left ventricle: The cavity size was normal. Wall thickness was   increased in a pattern of moderate LVH. The estimated ejection   fraction was 55%. Septal-lateral dyssynchrony. The septum appears   hypokinetic. Doppler parameters are consistent with abnormal left   ventricular relaxation (grade 1 diastolic dysfunction). - Aortic valve: There was no stenosis. - Mitral valve: There was no significant regurgitation. - Right ventricle: The cavity size was normal. Systolic function   was normal. - Pulmonary arteries: No complete TR doppler jet so unable to   estimate PA systolic pressure. - Systemic veins: IVC measured 2.6 cm with < 50% respirophasic   variation, suggesting RA pressure 15 mmHg.  Impressions:  - Technically difficult study with poor acoustic windows. Normal LV   size with moderate LV hypertrophy. EF 55%. Septal-lateral   dyssynchrony with septal hypokinesis. Normal RV size and systolic   function. No significant valvular abnormalities.  Cultures 12/31 HIV negative 12/31 strep pneumo urine antigen negative 1/1 influenza A negative 1/1 respiratory virus panel negative  HOSPITAL COURSE:   Acute on chronic respiratory failure with hypoxia / COPD with acute exacerbation   Patient was treated using COPD gold / focused order set utilized. Patient now back to baseline home O2 requirements. 2 L O2. Patient will be discharged and nebulizer  treatments and other inhalers as mentioned below.  Sepsis due to pneumonia Resolved. She has completed a course of antibiotics.  Chronic combined systolic and diastolic CHF (congestive heart failure) 2 D ECHO in 08/2016 with EF 45% and grade 1 DD.  Echocardiogram repeated during this hospitalization. Report as above. Patient was seen by cardiology. She was aggressively diuresed resulting in elevation in creatinine. Lasix was held temporarily. Creatinine has been improving. Can be resumed on Lasix orally from tomorrow. Continue beta blocker. No ACE inhibitor due to renal insufficiency.   Essential hypertension Blood pressure is reasonably well controlled.  Acute renal failure superimposed on CKD stage 3 (Baseline Cr In 08/2016 1.2) Acute elevation in Cr likely due to sepsis, acute infection but also possibly lasix. Lasix was held. Creatinine has been improving. Okay to resume Lasix from tomorrow. Will recommend checking her basic metabolic panel at the skilled nursing facility periodically. At the same time, we will need to tolerate a higher baseline creatinine.  Anemia of chronic kidney failure Hemoglobin stable  Anxiety and depression Continue lexapro   Chronic venous stasis changes bilateral lower extremities Patient also has a wound in her right lower extremity cyst and as a result of fall. Months ago. She was seen by plastic surgery and underwent surgical intervention. Wound has been healing.  Wound instructions as below: Right anterior lower leg: Silicone meshed dressing present over Endoform collagen dressing.  apply KY jelly to the area daily and cover with gauze, ABD pad, wrap with kerlix and tape.   Resolving nonintact lesion to left malleolus:  Cleanse with NS and pat gently dry.  Apply silicone border foam dressing.   Change every 3 days and PRN soilage.    Overall stable. She is better. Okay for discharge to SNF today.   PERTINENT LABS:  The results of significant diagnostics from this hospitalization (including imaging, microbiology, ancillary and laboratory) are listed below for reference.    Microbiology: Recent Results (from the past 240 hour(s))  Urine culture     Status: Abnormal   Collection  Time: 12/03/16  3:35 PM  Result Value Ref Range Status   Specimen Description URINE, RANDOM  Final   Special Requests NONE  Final   Culture MULTIPLE SPECIES PRESENT, SUGGEST RECOLLECTION (A)  Final   Report Status 12/05/2016 FINAL  Final  Blood Culture (routine x 2)     Status: None   Collection Time: 12/03/16  3:38 PM  Result Value Ref Range Status   Specimen Description BLOOD LEFT FOREARM  Final   Special Requests IN PEDIATRIC BOTTLE 2CC  Final   Culture NO GROWTH 5 DAYS  Final   Report Status 12/08/2016 FINAL  Final  Blood Culture (routine x 2)     Status: None   Collection Time: 12/03/16  3:45 PM  Result Value Ref Range Status   Specimen Description BLOOD RIGHT ANTECUBITAL  Final   Special Requests IN PEDIATRIC BOTTLE 2CC  Final   Culture NO GROWTH 5 DAYS  Final   Report Status 12/08/2016 FINAL  Final  MRSA PCR Screening     Status: None   Collection Time: 12/03/16  9:45 PM  Result Value Ref Range Status   MRSA by PCR NEGATIVE NEGATIVE Final    Comment:        The GeneXpert MRSA Assay (FDA approved for NASAL specimens only), is one component of a comprehensive MRSA colonization surveillance program. It is not intended to diagnose MRSA infection nor to guide or monitor treatment for  MRSA infections.   Culture, sputum-assessment     Status: None   Collection Time: 12/03/16  9:58 PM  Result Value Ref Range Status   Specimen Description Expect. Sput  Final   Special Requests NONE  Final   Sputum evaluation THIS SPECIMEN IS ACCEPTABLE FOR SPUTUM CULTURE  Final   Report Status 12/05/2016 FINAL  Final  Culture, respiratory (NON-Expectorated)     Status: None   Collection Time: 12/03/16  9:58 PM  Result Value Ref Range Status   Specimen Description Expect. Sput  Final   Special Requests NONE Reflexed from [id number]  Final   Gram Stain   Final    FEW WBC PRESENT, PREDOMINANTLY PMN FEW SQUAMOUS EPITHELIAL CELLS PRESENT MODERATE GRAM POSITIVE COCCOBACILLUS FEW GRAM NEGATIVE  COCCOBACILLI    Culture Consistent with normal respiratory flora.  Final   Report Status 12/07/2016 FINAL  Final  Respiratory Panel by PCR     Status: None   Collection Time: 12/04/16 12:21 PM  Result Value Ref Range Status   Adenovirus NOT DETECTED NOT DETECTED Final   Coronavirus 229E NOT DETECTED NOT DETECTED Final   Coronavirus HKU1 NOT DETECTED NOT DETECTED Final   Coronavirus NL63 NOT DETECTED NOT DETECTED Final   Coronavirus OC43 NOT DETECTED NOT DETECTED Final   Metapneumovirus NOT DETECTED NOT DETECTED Final   Rhinovirus / Enterovirus NOT DETECTED NOT DETECTED Final   Influenza A NOT DETECTED NOT DETECTED Final   Influenza B NOT DETECTED NOT DETECTED Final   Parainfluenza Virus 1 NOT DETECTED NOT DETECTED Final   Parainfluenza Virus 2 NOT DETECTED NOT DETECTED Final   Parainfluenza Virus 3 NOT DETECTED NOT DETECTED Final   Parainfluenza Virus 4 NOT DETECTED NOT DETECTED Final   Respiratory Syncytial Virus NOT DETECTED NOT DETECTED Final   Bordetella pertussis NOT DETECTED NOT DETECTED Final   Chlamydophila pneumoniae NOT DETECTED NOT DETECTED Final   Mycoplasma pneumoniae NOT DETECTED NOT DETECTED Final     Labs: Basic Metabolic Panel:  Recent Labs Lab 12/07/16 0337 12/08/16 0257 12/09/16 1410 12/11/16 1519 12/12/16 0528 12/13/16 1109  NA 140 138 139 138 139 138  K 4.2 3.9 4.2 4.7 3.7 4.8  CL 90* 87* 85* 85* 86* 89*  CO2 40* 42* 41* 42* 41* 38*  GLUCOSE 106* 253* 173* 240* 117* 137*  BUN 32* 31* 29* 38* 37* 37*  CREATININE 1.84* 1.70* 1.58* 2.24* 2.07* 1.93*  CALCIUM 9.2 8.3* 8.3* 8.9 9.1 9.2  MG 2.2 2.1  --   --   --   --    CBC:  Recent Labs Lab 12/07/16 0337 12/08/16 0257  WBC 8.6 6.7  NEUTROABS 5.9 5.9  HGB 9.5* 9.5*  HCT 33.3* 32.6*  MCV 85.8 84.9  PLT 338 315   BNP: BNP (last 3 results)  Recent Labs  12/03/16 1545  BNP 165.1*    CBG:  Recent Labs Lab 12/09/16 0824 12/10/16 0741 12/10/16 1153 12/12/16 0852 12/13/16 0757    GLUCAP 161* 179* 186* 148* 120*     IMAGING STUDIES Dg Chest Port 1 View  Result Date: 12/12/2016 CLINICAL DATA:  Shortness of breath, COPD EXAM: PORTABLE CHEST 1 VIEW COMPARISON:  Portable chest x-ray of December 09, 2016 FINDINGS: The lungs are well-expanded. There is increased density at the left lung base more conspicuous today. There is mild interstitial prominence on the right also more conspicuous. The cardiac silhouette remains enlarged. The central pulmonary vascularity remains prominent. There is calcification in the wall of the aortic arch.  IMPRESSION: COPD. Left basilar subsegmental atelectasis. Mild right interstitial prominence may reflect subsegmental atelectasis or interstitial edema. Stable cardiomegaly. Thoracic aortic atherosclerosis. Electronically Signed   By: David  Martinique M.D.   On: 12/12/2016 07:14   Dg Chest Port 1 View  Result Date: 12/09/2016 CLINICAL DATA:  Respiratory failure. Pt c/o of SOB, but denies chest pain. EXAM: PORTABLE CHEST 1 VIEW COMPARISON:  12/06/2016 FINDINGS: Stable mild cardiomegaly. No mediastinal or hilar masses. Mild linear atelectasis at the lung bases, also stable. Lungs otherwise clear. No convincing pleural effusion.  No pneumothorax. IMPRESSION: 1. No acute cardiopulmonary disease. 2. Mild persistent lung base atelectasis. Electronically Signed   By: Lajean Manes M.D.   On: 12/09/2016 07:36   Dg Chest Port 1 View  Result Date: 12/06/2016 CLINICAL DATA:  Shortness of breath and coughing. Symptoms are improving. EXAM: PORTABLE CHEST 1 VIEW COMPARISON:  12/03/2016 FINDINGS: Heart size is at the upper limits of normal. Mediastinal shadows are normal. Minimal basilar atelectasis persists, improving. No worsening or new findings. No effusions. No acute bone finding. IMPRESSION: Improving basilar atelectasis . Electronically Signed   By: Nelson Chimes M.D.   On: 12/06/2016 08:09   Dg Chest Port 1 View  Result Date: 12/03/2016 CLINICAL DATA:  Shortness  of breath. EXAM: PORTABLE CHEST 1 VIEW COMPARISON:  Chest radiograph 09/26/2016. FINDINGS: Monitoring leads overlie the patient. Stable cardiomegaly. Heterogeneous opacities within the lung bases bilaterally. No pleural effusion or pneumothorax. IMPRESSION: Cardiomegaly. Bibasilar heterogeneous opacities favored to represent atelectasis. Infection not excluded. Electronically Signed   By: Lovey Newcomer M.D.   On: 12/03/2016 16:24    DISCHARGE EXAMINATION: Vitals:   12/13/16 0500 12/13/16 0758 12/13/16 0805 12/13/16 1120  BP: 125/65 (!) 102/55  (!) 120/59  Pulse: (!) 57 64  61  Resp: 16 19  15   Temp: 97.3 F (36.3 C) 98.1 F (36.7 C)  98.3 F (36.8 C)  TempSrc: Oral Oral  Oral  SpO2: 100% 99% 98% 93%  Weight:      Height:       General appearance: alert, cooperative, appears stated age and no distress Resp: Few scattered wheezes bilaterally. Cardio: regular rate and rhythm, S1, S2 normal, no murmur, click, rub or gallop GI: soft, non-tender; bowel sounds normal; no masses,  no organomegaly Extremities: Right leg covered in dressing. No pitting edema  DISPOSITION: SNF  Discharge Instructions    (HEART FAILURE PATIENTS) Call MD:  Anytime you have any of the following symptoms: 1) 3 pound weight gain in 24 hours or 5 pounds in 1 week 2) shortness of breath, with or without a dry hacking cough 3) swelling in the hands, feet or stomach 4) if you have to sleep on extra pillows at night in order to breathe.    Complete by:  As directed    Call MD for:  difficulty breathing, headache or visual disturbances    Complete by:  As directed    Call MD for:  persistant dizziness or light-headedness    Complete by:  As directed    Call MD for:  temperature >100.4    Complete by:  As directed    Diet - low sodium heart healthy    Complete by:  As directed    Diet - low sodium heart healthy    Complete by:  As directed    Discharge instructions    Complete by:  As directed    Take medications as  prescribed Call if fevers or productive cough You  need outpatient sleep study so you can get back on the BiPAP/CPAP   Heart Failure patients record your daily weight using the same scale at the same time of day    Complete by:  As directed    Increase activity slowly    Complete by:  As directed    Increase activity slowly    Complete by:  As directed    Increase activity slowly    Complete by:  As directed       ALLERGIES:  Allergies  Allergen Reactions  . Sulfa Antibiotics Nausea And Vomiting and Other (See Comments)    "Stayed sick"     Current Discharge Medication List    START taking these medications   Details  amLODipine (NORVASC) 2.5 MG tablet Take 1 tablet (2.5 mg total) by mouth daily. Qty: 30 tablet, Refills: 1    ondansetron (ZOFRAN) 4 MG tablet Take 1 tablet (4 mg total) by mouth every 6 (six) hours as needed for nausea. Qty: 20 tablet, Refills: 0    predniSONE (DELTASONE) 10 MG tablet Take 40 mg daily for 5 days with FOOD, 20 mg daily for 4 days, 10 mg daily for 3 days then STOP Qty: 40 tablet, Refills: 0      CONTINUE these medications which have CHANGED   Details  furosemide (LASIX) 40 MG tablet Take 2 tablets (80 mg total) by mouth daily. Qty: 60 tablet, Refills: 1    guaiFENesin (MUCINEX) 600 MG 12 hr tablet Take 1 tablet (600 mg total) by mouth 2 (two) times daily. Qty: 60 tablet, Refills: 1    !! ipratropium-albuterol (DUONEB) 0.5-2.5 (3) MG/3ML SOLN Take 3 mLs by nebulization 2 (two) times daily. Qty: 360 mL    LORazepam (ATIVAN) 1 MG tablet Take 1 tablet (1 mg total) by mouth every 8 (eight) hours as needed for anxiety. Qty: 10 tablet, Refills: 0    metoprolol (LOPRESSOR) 50 MG tablet Take 1 tablet (50 mg total) by mouth 2 (two) times daily. Qty: 60 tablet, Refills: 1    oxyCODONE-acetaminophen (PERCOCET/ROXICET) 5-325 MG tablet Take 1 tablet by mouth every 8 (eight) hours as needed for moderate pain. Qty: 10 tablet, Refills: 0      potassium chloride SA (K-DUR,KLOR-CON) 20 MEQ tablet Take 1 tablet (20 mEq total) by mouth daily. Qty: 30 tablet, Refills: 0    temazepam (RESTORIL) 15 MG capsule Take 1 capsule (15 mg total) by mouth at bedtime as needed for sleep. Qty: 10 capsule, Refills: 0    Tiotropium Bromide Monohydrate 2.5 MCG/ACT AERS Inhale 2 puffs into the lungs daily. Qty: 1 Inhaler, Refills: 1     !! - Potential duplicate medications found. Please discuss with provider.    CONTINUE these medications which have NOT CHANGED   Details  albuterol (PROVENTIL HFA;VENTOLIN HFA) 108 (90 BASE) MCG/ACT inhaler Inhale 2 puffs into the lungs every 6 (six) hours as needed for wheezing or shortness of breath.    alendronate (FOSAMAX) 70 MG tablet Take 70 mg by mouth once a week. Take with a full glass of water on an empty stomach.    aspirin 81 MG tablet Take 81 mg by mouth daily.    Calcium Carbonate-Vitamin D 600-400 MG-UNIT per tablet Take 1 tablet by mouth 2 (two) times daily.    Cholecalciferol (VITAMIN D PO) Take 5,000 Units by mouth daily.    cyanocobalamin 500 MCG tablet Take 500 mcg by mouth daily.    escitalopram (LEXAPRO) 20 MG tablet Take 20  mg by mouth at bedtime.    !! ipratropium-albuterol (DUONEB) 0.5-2.5 (3) MG/3ML SOLN Take 3 mLs by nebulization every 4 (four) hours as needed (for breathing).    linaclotide (LINZESS) 145 MCG CAPS capsule Take 145 mcg by mouth daily before breakfast.    mometasone-formoterol (DULERA) 200-5 MCG/ACT AERO Inhale 2 puffs into the lungs 2 (two) times daily.    omeprazole (PRILOSEC) 40 MG capsule Take 1 capsule by mouth daily.    ranitidine (ZANTAC) 150 MG tablet Take 1 tablet by mouth 2 (two) times daily.    Vitamin D, Ergocalciferol, (DRISDOL) 50000 UNITS CAPS capsule Take 1 capsule by mouth every 7 (seven) days.    ACCU-CHEK SMARTVIEW test strip      !! - Potential duplicate medications found. Please discuss with provider.    STOP taking these medications      albuterol (PROVENTIL) (2.5 MG/3ML) 0.083% nebulizer solution      sulfamethoxazole-trimethoprim (BACTRIM DS,SEPTRA DS) 800-160 MG tablet           Contact information for follow-up providers    Magdalen Spatz, NP Follow up on 12/20/2016.   Specialty:  Pulmonary Disease Why:  10:00am with [facility] pulmonary Nurse Pracitioner  Contact information: [address] [place] [address] [phone]        Asencion Noble, MD. Schedule an appointment as soon as possible for a visit in 2 week(s).   Specialty:  Pulmonary Disease Why:  She will need outpatient sleep study Contact information: [address] [place] [address] [phone]        Fransico Him, MD Follow up.   Specialty:  Cardiology Why:  office will call you, if you do not hear from office by Thursday call the office.  Contact information: [id number] [address] [phone]            Contact information for after-discharge care    Destination    [facility] Follow up.   Specialty:  Skilled Nursing Facility Contact information: [address] [place] [address] [phone]                  TOTAL DISCHARGE TIME: 70 mins  [facility] Pager [phone]  12/13/2016, 12:44 PM

## 2016-12-13 NOTE — Discharge Instructions (Signed)
Chronic Obstructive Pulmonary Disease Chronic obstructive pulmonary disease (COPD) is a common lung problem. In COPD, the flow of air from the lungs is limited. The way your lungs work will probably never return to normal, but there are things you can do to improve your lungs and make yourself feel better. Your doctor may treat your condition with:  Medicines.  Oxygen.  Lung surgery.  Changes to your diet.  Rehabilitation. This may involve a team of specialists. Follow these instructions at home:  Take all medicines as told by your doctor.  Avoid medicines or cough syrups that dry up your airway (such as antihistamines) and do not allow you to get rid of thick spit. You do not need to avoid them if told differently by your doctor.  If you smoke, stop. Smoking makes the problem worse.  Avoid being around things that make your breathing worse (like smoke, chemicals, and fumes).  Use oxygen therapy and therapy to help improve your lungs (pulmonary rehabilitation) if told by your doctor. If you need home oxygen therapy, ask your doctor if you should buy a tool to measure your oxygen level (oximeter).  Avoid people who have a sickness you can catch (contagious).  Avoid going outside when it is very hot, cold, or humid.  Eat healthy foods. Eat smaller meals more often. Rest before meals.  Stay active, but remember to also rest.  Make sure to get all the shots (vaccines) your doctor recommends. Ask your doctor if you need a pneumonia shot.  Learn and use tips on how to relax.  Learn and use tips on how to control your breathing as told by your doctor. Try: 1. Breathing in (inhaling) through your nose for 1 second. Then, pucker your lips and breath out (exhale) through your lips for 2 seconds. 2. Putting one hand on your belly (abdomen). Breathe in slowly through your nose for 1 second. Your hand on your belly should move out. Pucker your lips and breathe out slowly through your lips.  Your hand on your belly should move in as you breathe out.  Learn and use controlled coughing to clear thick spit from your lungs. The steps are: 1. Lean your head a little forward. 2. Breathe in deeply. 3. Try to hold your breath for 3 seconds. 4. Keep your mouth slightly open while coughing 2 times. 5. Spit any thick spit out into a tissue. 6. Rest and do the steps again 1 or 2 times as needed. Contact a doctor if:  You cough up more thick spit than usual.  There is a change in the color or thickness of the spit.  It is harder to breathe than usual.  Your breathing is faster than usual. Get help right away if:  You have shortness of breath while resting.  You have shortness of breath that stops you from:  Being able to talk.  Doing normal activities.  You chest hurts for longer than 5 minutes.  Your skin color is more blue than usual.  Your pulse oximeter shows that you have low oxygen for longer than 5 minutes. This information is not intended to replace advice given to you by your health care provider. Make sure you discuss any questions you have with your health care provider. Document Released: 05/08/2008 Document Revised: 04/27/2016 Document Reviewed: 07/17/2013 Elsevier Interactive Patient Education  2017 [employer]  

## 2016-12-19 DIAGNOSIS — J9621 Acute and chronic respiratory failure with hypoxia: Secondary | ICD-10-CM | POA: Diagnosis not present

## 2016-12-19 DIAGNOSIS — N183 Chronic kidney disease, stage 3 (moderate): Secondary | ICD-10-CM | POA: Diagnosis not present

## 2016-12-19 DIAGNOSIS — R262 Difficulty in walking, not elsewhere classified: Secondary | ICD-10-CM | POA: Diagnosis not present

## 2016-12-19 DIAGNOSIS — I509 Heart failure, unspecified: Secondary | ICD-10-CM | POA: Diagnosis not present

## 2016-12-20 ENCOUNTER — Inpatient Hospital Stay: Payer: Commercial Managed Care - HMO | Admitting: Acute Care

## 2016-12-21 ENCOUNTER — Inpatient Hospital Stay: Payer: Commercial Managed Care - HMO | Admitting: Acute Care

## 2016-12-27 DIAGNOSIS — N184 Chronic kidney disease, stage 4 (severe): Secondary | ICD-10-CM | POA: Diagnosis not present

## 2016-12-27 DIAGNOSIS — S81801D Unspecified open wound, right lower leg, subsequent encounter: Secondary | ICD-10-CM | POA: Diagnosis not present

## 2016-12-27 DIAGNOSIS — I5042 Chronic combined systolic (congestive) and diastolic (congestive) heart failure: Secondary | ICD-10-CM | POA: Diagnosis not present

## 2016-12-27 DIAGNOSIS — I130 Hypertensive heart and chronic kidney disease with heart failure and stage 1 through stage 4 chronic kidney disease, or unspecified chronic kidney disease: Secondary | ICD-10-CM | POA: Diagnosis not present

## 2016-12-27 DIAGNOSIS — J441 Chronic obstructive pulmonary disease with (acute) exacerbation: Secondary | ICD-10-CM | POA: Diagnosis not present

## 2016-12-27 DIAGNOSIS — S81802D Unspecified open wound, left lower leg, subsequent encounter: Secondary | ICD-10-CM | POA: Diagnosis not present

## 2016-12-28 ENCOUNTER — Ambulatory Visit: Payer: Commercial Managed Care - HMO | Admitting: Cardiology

## 2016-12-29 ENCOUNTER — Ambulatory Visit (INDEPENDENT_AMBULATORY_CARE_PROVIDER_SITE_OTHER): Payer: Commercial Managed Care - HMO | Admitting: Adult Health

## 2016-12-29 ENCOUNTER — Encounter: Payer: Self-pay | Admitting: Adult Health

## 2016-12-29 VITALS — BP 124/80 | HR 69 | Temp 98.2°F | Ht 62.0 in | Wt 226.0 lb

## 2016-12-29 DIAGNOSIS — G4733 Obstructive sleep apnea (adult) (pediatric): Secondary | ICD-10-CM | POA: Diagnosis not present

## 2016-12-29 DIAGNOSIS — S81801D Unspecified open wound, right lower leg, subsequent encounter: Secondary | ICD-10-CM | POA: Diagnosis not present

## 2016-12-29 DIAGNOSIS — N184 Chronic kidney disease, stage 4 (severe): Secondary | ICD-10-CM | POA: Diagnosis not present

## 2016-12-29 DIAGNOSIS — J441 Chronic obstructive pulmonary disease with (acute) exacerbation: Secondary | ICD-10-CM | POA: Diagnosis not present

## 2016-12-29 DIAGNOSIS — I5042 Chronic combined systolic (congestive) and diastolic (congestive) heart failure: Secondary | ICD-10-CM | POA: Diagnosis not present

## 2016-12-29 DIAGNOSIS — S81802D Unspecified open wound, left lower leg, subsequent encounter: Secondary | ICD-10-CM | POA: Diagnosis not present

## 2016-12-29 DIAGNOSIS — I130 Hypertensive heart and chronic kidney disease with heart failure and stage 1 through stage 4 chronic kidney disease, or unspecified chronic kidney disease: Secondary | ICD-10-CM | POA: Diagnosis not present

## 2016-12-29 MED ORDER — MOMETASONE FURO-FORMOTEROL FUM 200-5 MCG/ACT IN AERO: 2.0000 | INHALATION_SPRAY | Freq: Two times a day (BID) | RESPIRATORY_TRACT | 0 refills | Status: DC

## 2016-12-29 NOTE — Assessment & Plan Note (Signed)
Recent flare -slowly improving  Would like her to restart [facility] along with her Susanne Greenhouse a  Plan  Patient Instructions  Restart BIPAP At bedtime  With Oxygen 2l/m  Wear Oxygen 2l/m rest and 3l/m walking .  Set up for ONO on BIPAP with Oxygen 2l/m  BIPAP download .  Continue on Spiriva daily  Mucinex DM Twice daily  As needed  Cough/congestion  Restart Dulera 2 puffs Twice daily  .  May use Duoneb every 6hr As needed  . Follow up with Dr. Murlean Iba in 6 weeks and As needed   Please contact office for sooner follow up if symptoms do not improve or worsen or seek emergency care

## 2016-12-29 NOTE — Assessment & Plan Note (Signed)
Moderate OSA /?OHS -continue on BIPAP with 2l/m O2  Encouraged on compliance  Check ONO on BIPAP with 2l/m  BIPAP downolaod   Plan  Patient Instructions  Restart BIPAP At bedtime  With Oxygen 2l/m  Wear Oxygen 2l/m rest and 3l/m walking .  Set up for ONO on BIPAP with Oxygen 2l/m  BIPAP download .  Continue on Spiriva daily  Mucinex DM Twice daily  As needed  Cough/congestion  Restart Dulera 2 puffs Twice daily  .  May use Duoneb every 6hr As needed  . Follow up with Dr. Murlean Iba in 6 weeks and As needed   Please contact office for sooner follow up if symptoms do not improve or worsen or seek emergency care

## 2016-12-29 NOTE — Patient Instructions (Addendum)
Restart BIPAP At bedtime  With Oxygen 2l/m  Wear Oxygen 2l/m rest and 3l/m walking .  Set up for ONO on BIPAP with Oxygen 2l/m  BIPAP download .  Continue on Spiriva daily  Mucinex DM Twice daily  As needed  Cough/congestion  Restart Dulera 2 puffs Twice daily  .  May use Duoneb every 6hr As needed  . Follow up with Dr. Murlean Iba in 6 weeks and As needed   Please contact office for sooner follow up if symptoms do not improve or worsen or seek emergency care

## 2016-12-29 NOTE — Progress Notes (Signed)
@Patient  ID: Amanda Horton, female    DOB: July 27, 1946, 71 y.o.   MRN: [id number]  No chief complaint on file.   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   12/29/2016 Post hospital follow up  Patient presents for a post hospital follow-up. Patient was admitted earlier this month for a COPD exacerbation with acute on chronic hypoxic respiratory failure. She was treated with IV antibiotics, steroids and nebulized bronchodilators. She also had decompensated congestive heart failure that improved with diuresis. Since discharge. Patient says she is feeling some better but continues to be short of breath with minimal activity. Patient is on BiPAP that does not wear it on a regular basis. We had a long discussion with her and her daughter about the importance of using this. She said that she would retried and we will check of oxygen test to see if her oxygen levels are dropping. She is currently on BiPAP with 2 L. He is on oxygen 2 L. Says her oxygen does drop sometimes when she walks. We discussed increasing this to 3 L to see if this helps. Denies any hemoptysis, chest pain, orthopnea, PND or increased edema Patient is a former patient of Dr. Joya Gaskins. Last seen in the office in 2016.   Allergies  Allergen Reactions  . Sulfa Antibiotics Nausea And Vomiting and Other (See Comments)    "Stayed sick"    Immunization History  Administered Date(s) Administered  . Influenza-Unspecified 10/09/2014  . Pneumococcal Conjugate-13 09/27/2014  . Pneumococcal Polysaccharide-23 01/24/2012  . Tdap 08/27/2016    Past Medical History:  Diagnosis Date  . Acute hyperkalemia   . Acute hypokalemia   . Acute on chronic renal failure ([place])   . Allergic rhinitis   . Anemia due to chronic kidney disease   . APC (atrial premature contractions)   . 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 Benign  essential hypertension   . Borderline diabetes   . Chronic insomnia   . Chronic venous insufficiency   . COPD (chronic obstructive pulmonary disease) ([place])   . GERD (gastroesophageal reflux disease)   . Hyperlipemia   . Hypoxia   . Lumbar disc narrowing   . On home oxygen therapy   . OSA (obstructive sleep apnea)   . Osteoporosis   . Respiratory failure requiring intubation ([place])   . Spinal stenosis of lumbar region without neurogenic claudication   . Varicose veins with pain   . Vitamin B12 deficiency   . Vitamin D deficiency     Tobacco History: History  Smoking Status  . Former Smoker  . Packs/day: 1.00  . Years: 55.00  . Types: Cigarettes  . Quit date: 12/04/2012  Smokeless Tobacco  . Never Used    Comment: Stopped in 2014   Counseling given: Not Answered   Outpatient Encounter Prescriptions as of 12/29/2016  Medication Sig  . ACCU-CHEK SMARTVIEW test strip   . albuterol (PROVENTIL HFA;VENTOLIN HFA) 108 (90 BASE) MCG/ACT inhaler Inhale 2 puffs into the lungs every 6 (six) hours as needed for wheezing or shortness of breath.  Marland Kitchen alendronate (FOSAMAX) 70 MG tablet Take 70 mg by mouth once a week. Take with a full glass of water on an empty stomach.  Marland Kitchen amLODipine (NORVASC) 2.5 MG tablet Take 1 tablet (2.5 mg total) by mouth daily.  Marland Kitchen aspirin 81 MG tablet Take 81 mg by mouth  daily.  . Calcium Carbonate-Vitamin D 600-400 MG-UNIT per tablet Take 1 tablet by mouth 2 (two) times daily.  . Cholecalciferol (VITAMIN D PO) Take 5,000 Units by mouth daily.  . cyanocobalamin 500 MCG tablet Take 500 mcg by mouth daily.  Marland Kitchen escitalopram (LEXAPRO) 20 MG tablet Take 20 mg by mouth at bedtime.  . furosemide (LASIX) 40 MG tablet Take 2 tablets (80 mg total) by mouth daily.  Marland Kitchen guaiFENesin (MUCINEX) 600 MG 12 hr tablet Take 1 tablet (600 mg total) by mouth 2 (two) times daily.  Marland Kitchen ipratropium-albuterol (DUONEB) 0.5-2.5 (3) MG/3ML SOLN Take 3 mLs by nebulization every 4 (four) hours as needed  (for breathing).  Marland Kitchen ipratropium-albuterol (DUONEB) 0.5-2.5 (3) MG/3ML SOLN Take 3 mLs by nebulization 2 (two) times daily.  Marland Kitchen linaclotide (LINZESS) 145 MCG CAPS capsule Take 145 mcg by mouth daily before breakfast.  . LORazepam (ATIVAN) 1 MG tablet Take 1 tablet (1 mg total) by mouth every 8 (eight) hours as needed for anxiety.  . metoprolol (LOPRESSOR) 50 MG tablet Take 1 tablet (50 mg total) by mouth 2 (two) times daily.  . mometasone-formoterol (DULERA) 200-5 MCG/ACT AERO Inhale 2 puffs into the lungs 2 (two) times daily.  Marland Kitchen omeprazole (PRILOSEC) 40 MG capsule Take 1 capsule by mouth daily.  . ondansetron (ZOFRAN) 4 MG tablet Take 1 tablet (4 mg total) by mouth every 6 (six) hours as needed for nausea.  Marland Kitchen oxyCODONE-acetaminophen (PERCOCET/ROXICET) 5-325 MG tablet Take 1 tablet by mouth every 8 (eight) hours as needed for moderate pain.  . potassium chloride SA (K-DUR,KLOR-CON) 20 MEQ tablet Take 1 tablet (20 mEq total) by mouth daily.  . ranitidine (ZANTAC) 150 MG tablet Take 1 tablet by mouth 2 (two) times daily.  . temazepam (RESTORIL) 15 MG capsule Take 1 capsule (15 mg total) by mouth at bedtime as needed for sleep.  . Tiotropium Bromide Monohydrate 2.5 MCG/ACT AERS Inhale 2 puffs into the lungs daily.  . Vitamin D, Ergocalciferol, (DRISDOL) 50000 UNITS CAPS capsule Take 1 capsule by mouth every 7 (seven) days.  . mometasone-formoterol (DULERA) 200-5 MCG/ACT AERO Inhale 2 puffs into the lungs 2 (two) times daily.  . [DISCONTINUED] predniSONE (DELTASONE) 10 MG tablet Take 40 mg daily for 5 days with FOOD, 20 mg daily for 4 days, 10 mg daily for 3 days then STOP   No facility-administered encounter medications on file as of 12/29/2016.      Review of Systems  Constitutional:   No  weight loss, night sweats,  Fevers, chills, + fatigue, or  lassitude.  HEENT:   No headaches,  Difficulty swallowing,  Tooth/dental problems, or  Sore throat,                No sneezing, itching, ear ache,  nasal congestion, post nasal drip,   CV:  No chest pain,  Orthopnea, PND, anasarca, dizziness, palpitations, syncope.   GI  No heartburn, indigestion, abdominal pain, nausea, vomiting, diarrhea, change in bowel habits, loss of appetite, bloody stools.   Resp:    No chest wall deformity  Skin: no rash or lesions.  GU: no dysuria, change in color of urine, no urgency or frequency.  No flank pain, no hematuria   MS:  No joint pain or swelling.  No decreased range of motion.  No back pain.    Physical Exam  BP 124/80 (BP Location: Left Arm, Patient Position: Sitting, Cuff Size: Large)   Pulse 69   Temp 98.2 F (36.8 C) (Oral)  Ht 5\' 2"  (1.575 m)   Wt 226 lb (102.5 kg)   SpO2 94%   BMI 41.34 kg/m   GEN: A/Ox3; pleasant , NAD, obese    HEENT:  [place]/AT,  EACs-clear, TMs-wnl, NOSE-clear, THROAT-clear, no lesions, no postnasal drip or exudate noted.   NECK:  Supple w/ fair ROM; no JVD; normal carotid impulses w/o bruits; no thyromegaly or nodules palpated; no lymphadenopathy.    RESP  Clear  P & A; w/o, wheezes/ rales/ or rhonchi. no accessory muscle use, no dullness to percussion  CARD:  RRR, no m/r/g, tr peripheral edema, pulses intact, no cyanosis or clubbing.  GI:   Soft & nt; nml bowel sounds; no organomegaly or masses detected.   Musco: Warm bil, no deformities or joint swelling noted.   Neuro: alert, no focal deficits noted.    Skin: Warm, no lesions or rashes  Psych:  No change in mood or affect. No depression or anxiety.  No memory loss.  Lab Results:  CBC    Component Value Date/Time   WBC 6.7 12/08/2016 0257   RBC 3.84 (L) 12/08/2016 0257   HGB 9.5 (L) 12/08/2016 0257   HCT 32.6 (L) 12/08/2016 0257   PLT 315 12/08/2016 0257   MCV 84.9 12/08/2016 0257   MCH 24.7 (L) 12/08/2016 0257   MCHC 29.1 (L) 12/08/2016 0257   RDW 15.0 12/08/2016 0257   LYMPHSABS 0.3 (L) 12/08/2016 0257   MONOABS 0.4 12/08/2016 0257   EOSABS 0.0 12/08/2016 0257   BASOSABS 0.0  12/08/2016 0257    BMET    Component Value Date/Time   NA 138 12/13/2016 1109   K 4.8 12/13/2016 1109   CL 89 (L) 12/13/2016 1109   CO2 38 (H) 12/13/2016 1109   GLUCOSE 137 (H) 12/13/2016 1109   BUN 37 (H) 12/13/2016 1109   CREATININE 1.93 (H) 12/13/2016 1109   CALCIUM 9.2 12/13/2016 1109   GFRNONAA 25 (L) 12/13/2016 1109   GFRAA 29 (L) 12/13/2016 1109    BNP    Component Value Date/Time   BNP 165.1 (H) 12/03/2016 1545    ProBNP No results found for: PROBNP  Imaging: Dg Chest Port 1 View  Result Date: 12/12/2016 CLINICAL DATA:  Shortness of breath, COPD EXAM: PORTABLE CHEST 1 VIEW COMPARISON:  Portable chest x-ray of December 09, 2016 FINDINGS: The lungs are well-expanded. There is increased density at the left lung base more conspicuous today. There is mild interstitial prominence on the right also more conspicuous. The cardiac silhouette remains enlarged. The central pulmonary vascularity remains prominent. There is calcification in the wall of the aortic arch. IMPRESSION: COPD. Left basilar subsegmental atelectasis. Mild right interstitial prominence may reflect subsegmental atelectasis or interstitial edema. Stable cardiomegaly. Thoracic aortic atherosclerosis. Electronically Signed   By: David  Martinique M.D.   On: 12/12/2016 07:14   Dg Chest Port 1 View  Result Date: 12/09/2016 CLINICAL DATA:  Respiratory failure. Pt c/o of SOB, but denies chest pain. EXAM: PORTABLE CHEST 1 VIEW COMPARISON:  12/06/2016 FINDINGS: Stable mild cardiomegaly. No mediastinal or hilar masses. Mild linear atelectasis at the lung bases, also stable. Lungs otherwise clear. No convincing pleural effusion.  No pneumothorax. IMPRESSION: 1. No acute cardiopulmonary disease. 2. Mild persistent lung base atelectasis. Electronically Signed   By: Lajean Manes M.D.   On: 12/09/2016 07:36   Dg Chest Port 1 View  Result Date: 12/06/2016 CLINICAL DATA:  Shortness of breath and coughing. Symptoms are improving. EXAM:  PORTABLE CHEST 1 VIEW COMPARISON:  12/03/2016 FINDINGS: Heart size is at the upper limits of normal. Mediastinal shadows are normal. Minimal basilar atelectasis persists, improving. No worsening or new findings. No effusions. No acute bone finding. IMPRESSION: Improving basilar atelectasis . Electronically Signed   By: Nelson Chimes M.D.   On: 12/06/2016 08:09   Dg Chest Port 1 View  Result Date: 12/03/2016 CLINICAL DATA:  Shortness of breath. EXAM: PORTABLE CHEST 1 VIEW COMPARISON:  Chest radiograph 09/26/2016. FINDINGS: Monitoring leads overlie the patient. Stable cardiomegaly. Heterogeneous opacities within the lung bases bilaterally. No pleural effusion or pneumothorax. IMPRESSION: Cardiomegaly. Bibasilar heterogeneous opacities favored to represent atelectasis. Infection not excluded. Electronically Signed   By: Lovey Newcomer M.D.   On: 12/03/2016 16:24     Assessment & Plan:   COPD exacerbation ([place]) Recent flare -slowly improving  Would like her to restart Dulera along with her Susanne Greenhouse a  Plan  Patient Instructions  Restart BIPAP At bedtime  With Oxygen 2l/m  Wear Oxygen 2l/m rest and 3l/m walking .  Set up for ONO on BIPAP with Oxygen 2l/m  BIPAP download .  Continue on Spiriva daily  Mucinex DM Twice daily  As needed  Cough/congestion  Restart Dulera 2 puffs Twice daily  .  May use Duoneb every 6hr As needed  . Follow up with Dr. Murlean Iba in 6 weeks and As needed   Please contact office for sooner follow up if symptoms do not improve or worsen or seek emergency care       OSA (obstructive sleep apnea) Moderate OSA /?OHS -continue on BIPAP with 2l/m O2  Encouraged on compliance  Check ONO on BIPAP with 2l/m  BIPAP downolaod   Plan  Patient Instructions  Restart BIPAP At bedtime  With Oxygen 2l/m  Wear Oxygen 2l/m rest and 3l/m walking .  Set up for ONO on BIPAP with Oxygen 2l/m  BIPAP download .  Continue on Spiriva daily  Mucinex DM Twice daily  As needed   Cough/congestion  Restart Dulera 2 puffs Twice daily  .  May use Duoneb every 6hr As needed  . Follow up with Dr. Murlean Iba in 6 weeks and As needed   Please contact office for sooner follow up if symptoms do not improve or worsen or seek emergency care          Rexene Edison, NP 12/29/2016

## 2017-01-01 ENCOUNTER — Telehealth: Payer: Self-pay | Admitting: Pulmonary Disease

## 2017-01-01 DIAGNOSIS — S81802D Unspecified open wound, left lower leg, subsequent encounter: Secondary | ICD-10-CM | POA: Diagnosis not present

## 2017-01-01 DIAGNOSIS — I130 Hypertensive heart and chronic kidney disease with heart failure and stage 1 through stage 4 chronic kidney disease, or unspecified chronic kidney disease: Secondary | ICD-10-CM | POA: Diagnosis not present

## 2017-01-01 DIAGNOSIS — N184 Chronic kidney disease, stage 4 (severe): Secondary | ICD-10-CM | POA: Diagnosis not present

## 2017-01-01 DIAGNOSIS — J441 Chronic obstructive pulmonary disease with (acute) exacerbation: Secondary | ICD-10-CM | POA: Diagnosis not present

## 2017-01-01 DIAGNOSIS — I5042 Chronic combined systolic (congestive) and diastolic (congestive) heart failure: Secondary | ICD-10-CM | POA: Diagnosis not present

## 2017-01-01 DIAGNOSIS — S81801D Unspecified open wound, right lower leg, subsequent encounter: Secondary | ICD-10-CM | POA: Diagnosis not present

## 2017-01-01 NOTE — Telephone Encounter (Signed)
Spoke with pt's daughter, Butch Penny. She is aware of AD's response. States they are using BiPAP with 2L. Reports they will titrate her oxygen to keep her >88%. Butch Penny will call us in the morning to give [place] an update. There is already an order in for an ONO to be done on 2L and BiPAP.

## 2017-01-01 NOTE — Telephone Encounter (Signed)
   Is pt sleeping with bipap and 2L o2 as instructed?   If so, they can go up on O2 to 3-4 L with bipap. Keep o2 sats > 88%.   I have not seen this pt.  Maybe, we can do an ONO on bipap and 3L O2?   Thanks.   Monica Becton, MD 01/01/2017, 9:23 AM [facility] Pager [phone] After 3 pm or if no answer, call [phone]

## 2017-01-01 NOTE — Telephone Encounter (Signed)
Spoke with pt's daughter, Butch Penny. States that she is having issues with pt's BiPAP machine. While on the BiPAP machine her oxygen level is dropping to 80%. When this happens they are taking her off of the BiPAP and changing her to her oxygen. On oxygen she is staying 98-99% on 3L. Butch Penny wants to know what AD would like them to do. They are afraid to put her on BiPAP due to her desaturating.  AD - please advise. Thanks.

## 2017-01-01 NOTE — Telephone Encounter (Signed)
lmomtcb x1 

## 2017-01-01 NOTE — Telephone Encounter (Signed)
Patient is returning phone call [phone]

## 2017-01-02 ENCOUNTER — Telehealth: Payer: Self-pay | Admitting: Pulmonary Disease

## 2017-01-02 NOTE — Telephone Encounter (Signed)
lmtcb X1 for pt's daughter 

## 2017-01-03 DIAGNOSIS — R400 Somnolence: Secondary | ICD-10-CM | POA: Diagnosis not present

## 2017-01-03 DIAGNOSIS — M6281 Muscle weakness (generalized): Secondary | ICD-10-CM | POA: Diagnosis not present

## 2017-01-03 DIAGNOSIS — M899 Disorder of bone, unspecified: Secondary | ICD-10-CM | POA: Diagnosis not present

## 2017-01-03 DIAGNOSIS — E559 Vitamin D deficiency, unspecified: Secondary | ICD-10-CM | POA: Diagnosis not present

## 2017-01-03 DIAGNOSIS — Z9181 History of falling: Secondary | ICD-10-CM | POA: Diagnosis not present

## 2017-01-03 DIAGNOSIS — M150 Primary generalized (osteo)arthritis: Secondary | ICD-10-CM | POA: Diagnosis not present

## 2017-01-03 DIAGNOSIS — K219 Gastro-esophageal reflux disease without esophagitis: Secondary | ICD-10-CM | POA: Diagnosis not present

## 2017-01-03 DIAGNOSIS — R0902 Hypoxemia: Secondary | ICD-10-CM | POA: Diagnosis not present

## 2017-01-03 DIAGNOSIS — E538 Deficiency of other specified B group vitamins: Secondary | ICD-10-CM | POA: Diagnosis not present

## 2017-01-03 DIAGNOSIS — I10 Essential (primary) hypertension: Secondary | ICD-10-CM | POA: Diagnosis not present

## 2017-01-03 DIAGNOSIS — J441 Chronic obstructive pulmonary disease with (acute) exacerbation: Secondary | ICD-10-CM | POA: Diagnosis not present

## 2017-01-03 DIAGNOSIS — J9611 Chronic respiratory failure with hypoxia: Secondary | ICD-10-CM | POA: Diagnosis not present

## 2017-01-03 DIAGNOSIS — J9601 Acute respiratory failure with hypoxia: Secondary | ICD-10-CM | POA: Diagnosis not present

## 2017-01-03 DIAGNOSIS — J449 Chronic obstructive pulmonary disease, unspecified: Secondary | ICD-10-CM | POA: Diagnosis not present

## 2017-01-03 DIAGNOSIS — I509 Heart failure, unspecified: Secondary | ICD-10-CM | POA: Diagnosis not present

## 2017-01-03 DIAGNOSIS — F3341 Major depressive disorder, recurrent, in partial remission: Secondary | ICD-10-CM | POA: Diagnosis not present

## 2017-01-03 DIAGNOSIS — Z5181 Encounter for therapeutic drug level monitoring: Secondary | ICD-10-CM | POA: Diagnosis not present

## 2017-01-03 DIAGNOSIS — M47817 Spondylosis without myelopathy or radiculopathy, lumbosacral region: Secondary | ICD-10-CM | POA: Diagnosis not present

## 2017-01-03 DIAGNOSIS — S81801D Unspecified open wound, right lower leg, subsequent encounter: Secondary | ICD-10-CM | POA: Diagnosis not present

## 2017-01-03 DIAGNOSIS — M810 Age-related osteoporosis without current pathological fracture: Secondary | ICD-10-CM | POA: Diagnosis not present

## 2017-01-03 NOTE — Telephone Encounter (Signed)
ATC and advised to call back.

## 2017-01-03 NOTE — Telephone Encounter (Signed)
[phone] daughter calling back

## 2017-01-04 ENCOUNTER — Ambulatory Visit: Payer: Commercial Managed Care - HMO | Admitting: Cardiology

## 2017-01-04 ENCOUNTER — Telehealth: Payer: Self-pay

## 2017-01-04 NOTE — Telephone Encounter (Signed)
lmtcb for pt's daughter, Butch Penny.

## 2017-01-04 NOTE — Telephone Encounter (Signed)
PCCs   Pt.'s daughter called and wanted to let us know her mom is in Pulmonary Rehab and right now will not be able to do the ONO because she is not home, she stated she will contact us to get her rescheduled. I will be sending a message to AD as well

## 2017-01-04 NOTE — Telephone Encounter (Signed)
AD  I spoke with the pt. Daughter and she wanted to let you know that her mom stats were 88-91% on 3L. Also that her mom is now in Pulmonary Rehab and she will update [place] on how she is doing, and when she will be back home

## 2017-01-05 DIAGNOSIS — E559 Vitamin D deficiency, unspecified: Secondary | ICD-10-CM | POA: Diagnosis not present

## 2017-01-05 DIAGNOSIS — J9611 Chronic respiratory failure with hypoxia: Secondary | ICD-10-CM | POA: Diagnosis not present

## 2017-01-05 DIAGNOSIS — E538 Deficiency of other specified B group vitamins: Secondary | ICD-10-CM | POA: Diagnosis not present

## 2017-01-05 DIAGNOSIS — J449 Chronic obstructive pulmonary disease, unspecified: Secondary | ICD-10-CM | POA: Diagnosis not present

## 2017-01-08 DIAGNOSIS — S81801D Unspecified open wound, right lower leg, subsequent encounter: Secondary | ICD-10-CM | POA: Diagnosis not present

## 2017-01-09 ENCOUNTER — Encounter: Payer: Self-pay | Admitting: Cardiology

## 2017-01-09 ENCOUNTER — Ambulatory Visit (INDEPENDENT_AMBULATORY_CARE_PROVIDER_SITE_OTHER): Payer: Medicare HMO | Admitting: Cardiology

## 2017-01-09 ENCOUNTER — Encounter (INDEPENDENT_AMBULATORY_CARE_PROVIDER_SITE_OTHER): Payer: Self-pay

## 2017-01-09 VITALS — BP 130/72 | HR 63 | Ht 62.0 in | Wt 248.0 lb

## 2017-01-09 DIAGNOSIS — Z5181 Encounter for therapeutic drug level monitoring: Secondary | ICD-10-CM | POA: Diagnosis not present

## 2017-01-09 NOTE — Telephone Encounter (Signed)
Noted.  Monica Becton, MD 01/09/2017, 6:33 AM [facility] Pager [phone] After 3 pm or if no answer, call [phone]

## 2017-01-09 NOTE — Progress Notes (Signed)
01/09/2017 Amanda Horton   [birth year]  [id number]  Primary Physician Nicoletta Dress, MD Primary Cardiologist: Dr. Radford Pax    Reason for Visit/CC: [facility] f/u for CHF Exacerbation.   HPI:  Amanda Horton presents to clinic today for post hospital f/u after recent admission for acute hypoxic respiratory failure in the setting of acute diastolic CHF exacerbation and PNA. She was admitted by IM and treated with diuretics and antibiotics. She also has chronic renal insufficiency, but renal function was monitored closely during hospitalization. 2D echo showed an EF of 55%, moderate LVH and grade 1 DD. After diuresis she was placed on PO Lasix 80 mg daily. SCr day of discharge was 1.93. She was discharged to a SNF for rehab. She was discharged on 2 L [place].   She presents back to clinic today. She reports that she has done ok. She denies any worsening dyspnea. She remains on 2L and is suppose to f/u with a pulmonologist. She has not had to increase her home O2 any. She denies any increased swelling. She notes good UOP. No chest pain.   No outpatient prescriptions have been marked as taking for the 01/09/17 encounter (Office Visit) with Consuelo Pandy, PA-C.   Allergies  Allergen Reactions  . Sulfa Antibiotics Nausea And Vomiting and Other (See Comments)    "Stayed sick"   Past Medical History:  Diagnosis Date  . Acute hyperkalemia   . Acute hypokalemia   . Acute on chronic renal failure ([place])   . Allergic rhinitis   . Anemia due to chronic kidney disease   . APC (atrial premature contractions)   . Arthritis of lumbar spine ([place])   . Benign essential hypertension   . Borderline diabetes   . Chronic insomnia   . Chronic venous insufficiency   . COPD (chronic obstructive pulmonary disease) ([place])   . GERD (gastroesophageal reflux disease)   . Hyperlipemia   . Hypoxia   . Lumbar disc narrowing   . On home oxygen therapy   . OSA (obstructive sleep apnea)   . Osteoporosis   .  Respiratory failure requiring intubation ([place])   . Spinal stenosis of lumbar region without neurogenic claudication   . Varicose veins with pain   . Vitamin B12 deficiency   . Vitamin D deficiency    Family History  Problem Relation Age of Onset  . Emphysema Father   . Asthma Father   . Throat cancer Mother    Past Surgical History:  Procedure Laterality Date  . APPLICATION OF A-CELL OF EXTREMITY Right 08/30/2016   Procedure: APPLICATION OF A-CELL OF EXTREMITY;  Surgeon: Loel Lofty Dillingham, DO;  Location: [facility];  Service: Plastics;  Laterality: Right;  . APPLICATION OF A-CELL OF EXTREMITY Right 09/20/2016   Procedure: APPLICATION OF A-CELL OF right lower leg wound;  Surgeon: Loel Lofty Dillingham, DO;  Location: [place];  Service: Plastics;  Laterality: Right;  . APPLICATION OF WOUND VAC Right 08/30/2016   Procedure: APPLICATION OF WOUND VAC;  Surgeon: Loel Lofty Dillingham, DO;  Location: [place];  Service: Plastics;  Laterality: Right;  . APPLICATION OF WOUND VAC Right 09/20/2016   Procedure: APPLICATION OF WOUND VAC right lower leg wound;  Surgeon: Loel Lofty Dillingham, DO;  Location: [place];  Service: Plastics;  Laterality: Right;  . I&D EXTREMITY Right 08/30/2016   Procedure: IRRIGATION AND DEBRIDEMENT EXTREMITY RIGHT;  Surgeon: Loel Lofty Dillingham, DO;  Location: [place];  Service: Plastics;  Laterality: Right;  . INCISION AND DRAINAGE  OF WOUND Right 09/20/2016   Procedure: IRRIGATION AND DEBRIDEMENT right lower leg wound;  Surgeon: Loel Lofty Dillingham, DO;  Location: [place];  Service: Plastics;  Laterality: Right;  . PARTIAL HYSTERECTOMY     Social History   Social History  . Marital status: Single    Spouse name: N/A  . Number of children: N/A  . Years of education: N/A   Occupational History  . Retired     [employer]   Social History Main Topics  . Smoking status: Former Smoker    Packs/day: 1.00    Years: 55.00    Types: Cigarettes    Quit date: 12/04/2012  . Smokeless  tobacco: Never Used     Comment: Stopped in 2014  . Alcohol use No  . Drug use: No  . Sexual activity: Not on file   Other Topics Concern  . Not on file   Social History Narrative  . No narrative on file     Review of Systems: General: negative for chills, fever, night sweats or weight changes.  Cardiovascular: negative for chest pain, dyspnea on exertion, edema, orthopnea, palpitations, paroxysmal nocturnal dyspnea or shortness of breath Dermatological: negative for rash Respiratory: negative for cough or wheezing Urologic: negative for hematuria Abdominal: negative for nausea, vomiting, diarrhea, bright red blood per rectum, melena, or hematemesis Neurologic: negative for visual changes, syncope, or dizziness All other systems reviewed and are otherwise negative except as noted above.   Physical Exam:  Blood pressure 130/72, pulse 63, height 5\' 2"  (1.575 m), weight 248 lb (112.5 kg).  General appearance: alert, cooperative, no distress and moderately obese Neck: no carotid bruit and no JVD Lungs: clear to auscultation bilaterally Heart: regular rate and rhythm, S1, S2 normal, no murmur, click, rub or gallop Extremities: trace bilatearl LEE Pulses: 2+ and symmetric Skin: Skin color, texture, turgor normal. No rashes or lesions Neurologic: Grossly normal  EKG not performed   ASSESSMENT AND PLAN:   1. Chronic Diastolic HF: volume is stable on exam today. BP is controlled. Continue Lasix. We will check a f/u BMP today to ensure renal function is stable. Low sodium diet advised. We discussed importance of strict compliance with low sodium and adherence with daily weights once she is released home from SNF. Pt advised to call our office if > 3 lb weight gain in 24 hrs or >5 lb weight gain in 1 week.   2. Recent PNA: finished course of antibiotics.   3. CKD: she is on PO lasix, 80 mg. We will check a f/u BMP today.   4. OSA: she reports nightly compliance w/ CPAP but is due  for titration of her CPAP device. She states that she is scheduled for a repeat study, per her PCP/pulmonolgist.   PLAN  F/u with Dr. Radford Pax in 2-3 months  Lyda Jester PA-C 01/09/2017 4:13 PM

## 2017-01-09 NOTE — Patient Instructions (Addendum)
Medication Instructions:   Your physician recommends that you continue on your current medications as directed. Please refer to the Current Medication list given to you today.   If you need a refill on your cardiac medications before your next appointment, please call your pharmacy.  Labwork: BMET TODAY     Testing/Procedures: NONE ORDERED  TODAY    Follow-Up: IN 3 MONTHS WITH DR TURNER    Any Other Special Instructions Will Be Listed Below (If Applicable).  PLEASE WEIGH DAILY AND TAKE NOTE  IF ANY WEIGHT GAIN OF 3 LBS IN 24 HOURS OR 5 LBS IN A WEEK PLEASE  CONTACT OUR OFFICE IN THE NURSE TRIAGE DEPT TO GIVE [place] THAT INFORMATION FOR  PA-C BRITTAINY SIMMONS    Low-Sodium Eating Plan Sodium raises blood pressure and causes water to be held in the body. Getting less sodium from food will help lower your blood pressure, reduce any swelling, and protect your heart, liver, and kidneys. We get sodium by adding salt (sodium chloride) to food. Most of our sodium comes from canned, boxed, and frozen foods. Restaurant foods, fast foods, and pizza are also very high in sodium. Even if you take medicine to lower your blood pressure or to reduce fluid in your body, getting less sodium from your food is important. What is my plan? Most people should limit their sodium intake to 2,300 mg a day. Your health care provider recommends that you limit your sodium intake to __________ a day. What do I need to know about this eating plan? For the low-sodium eating plan, you will follow these general guidelines:  Choose foods with a % Daily Value for sodium of less than 5% (as listed on the food label).  Use salt-free seasonings or herbs instead of table salt or sea salt.  Check with your health care provider or pharmacist before using salt substitutes.  Eat fresh foods.  Eat more vegetables and fruits.  Limit canned vegetables. If you do use them, rinse them well to decrease the sodium.  Limit  cheese to 1 oz (28 g) per day.  Eat lower-sodium products, often labeled as "lower sodium" or "no salt added."  Avoid foods that contain monosodium glutamate (MSG). MSG is sometimes added to [place] food and some canned foods.  Check food labels (Nutrition Facts labels) on foods to learn how much sodium is in one serving.  Eat more home-cooked food and less restaurant, buffet, and fast food.  When eating at a restaurant, ask that your food be prepared with less salt, or no salt if possible. How do I read food labels for sodium information? The Nutrition Facts label lists the amount of sodium in one serving of the food. If you eat more than one serving, you must multiply the listed amount of sodium by the number of servings. Food labels may also identify foods as:  Sodium free-Less than 5 mg in a serving.  Very low sodium-35 mg or less in a serving.  Low sodium-140 mg or less in a serving.  Light in sodium-50% less sodium in a serving. For example, if a food that usually has 300 mg of sodium is changed to become light in sodium, it will have 150 mg of sodium.  Reduced sodium-25% less sodium in a serving. For example, if a food that usually has 400 mg of sodium is changed to reduced sodium, it will have 300 mg of sodium. What foods can I eat? Grains  Low-sodium cereals, including oats, puffed wheat  and rice, and shredded wheat cereals. Low-sodium crackers. Unsalted rice and pasta. Lower-sodium bread. Vegetables  Frozen or fresh vegetables. Low-sodium or reduced-sodium canned vegetables. Low-sodium or reduced-sodium tomato sauce and paste. Low-sodium or reduced-sodium tomato and vegetable juices. Fruits  Fresh, frozen, and canned fruit. Fruit juice. Meat and Other Protein Products  Low-sodium canned tuna and salmon. Fresh or frozen meat, poultry, seafood, and fish. Lamb. Unsalted nuts. Dried beans, peas, and lentils without added salt. Unsalted canned beans. Homemade soups without salt.  Eggs. Dairy  Milk. Soy milk. Ricotta cheese. Low-sodium or reduced-sodium cheeses. Yogurt. Condiments  Fresh and dried herbs and spices. Salt-free seasonings. Onion and garlic powders. Low-sodium varieties of mustard and ketchup. Fresh or refrigerated horseradish. Lemon juice. Fats and Oils  Reduced-sodium salad dressings. Unsalted butter. Other  Unsalted popcorn and pretzels. The items listed above may not be a complete list of recommended foods or beverages. Contact your dietitian for more options.  What foods are not recommended? Grains  Instant hot cereals. Bread stuffing, pancake, and biscuit mixes. Croutons. Seasoned rice or pasta mixes. Noodle soup cups. Boxed or frozen macaroni and cheese. Self-rising flour. Regular salted crackers. Vegetables  Regular canned vegetables. Regular canned tomato sauce and paste. Regular tomato and vegetable juices. Frozen vegetables in sauces. Salted [place] fries. Olives. Angie Fava. Relishes. Sauerkraut. Salsa. Meat and Other Protein Products  Salted, canned, smoked, spiced, or pickled meats, seafood, or fish. Bacon, ham, sausage, hot dogs, corned beef, chipped beef, and packaged luncheon meats. Salt pork. Jerky. Pickled herring. Anchovies, regular canned tuna, and sardines. Salted nuts. Dairy  Processed cheese and cheese spreads. Cheese curds. Blue cheese and cottage cheese. Buttermilk. Condiments  Onion and garlic salt, seasoned salt, table salt, and sea salt. Canned and packaged gravies. Worcestershire sauce. Tartar sauce. Barbecue sauce. Teriyaki sauce. Soy sauce, including reduced sodium. Steak sauce. Fish sauce. Oyster sauce. Cocktail sauce. Horseradish that you find on the shelf. Regular ketchup and mustard. Meat flavorings and tenderizers. Bouillon cubes. Hot sauce. Tabasco sauce. Marinades. Taco seasonings. Relishes. Fats and Oils  Regular salad dressings. Salted butter. Margarine. Ghee. Bacon fat. Other  Potato and tortilla chips. Corn chips and  puffs. Salted popcorn and pretzels. Canned or dried soups. Pizza. Frozen entrees and pot pies. The items listed above may not be a complete list of foods and beverages to avoid. Contact your dietitian for more information.  This information is not intended to replace advice given to you by your health care provider. Make sure you discuss any questions you have with your health care provider. Document Released: 05/12/2002 Document Revised: 04/27/2016 Document Reviewed: 09/24/2013 Elsevier Interactive Patient Education  2017 [employer].

## 2017-01-10 LAB — BASIC METABOLIC PANEL
BUN/Creatinine Ratio: 10 — ABNORMAL LOW (ref 12–28)
BUN: 17 mg/dL (ref 8–27)
CO2: 43 mmol/L — AB (ref 18–29)
CREATININE: 1.66 mg/dL — AB (ref 0.57–1.00)
Calcium: 8.9 mg/dL (ref 8.7–10.3)
Chloride: 86 mmol/L — ABNORMAL LOW (ref 96–106)
GFR, EST AFRICAN AMERICAN: 35 mL/min/{1.73_m2} — AB (ref >59)
GFR, EST NON AFRICAN AMERICAN: 31 mL/min/{1.73_m2} — AB (ref >59)
Glucose: 142 mg/dL — ABNORMAL HIGH (ref 65–99)
POTASSIUM: 3.5 mmol/L (ref 3.5–5.2)
SODIUM: 141 mmol/L (ref 134–144)

## 2017-01-11 ENCOUNTER — Telehealth: Payer: Self-pay | Admitting: Cardiology

## 2017-01-11 NOTE — Telephone Encounter (Signed)
Follow Up   Patient son returning phone call from about labs.

## 2017-01-11 NOTE — Telephone Encounter (Signed)
Pt has been made aware of her lab results and she verbalized understanding and gave me permission to verbally speak with her son.  Son has been made aware of pts lab results and he verbalized understanding.

## 2017-01-12 DIAGNOSIS — R400 Somnolence: Secondary | ICD-10-CM | POA: Diagnosis not present

## 2017-01-12 DIAGNOSIS — E538 Deficiency of other specified B group vitamins: Secondary | ICD-10-CM | POA: Diagnosis not present

## 2017-01-12 DIAGNOSIS — J9611 Chronic respiratory failure with hypoxia: Secondary | ICD-10-CM | POA: Diagnosis not present

## 2017-01-12 DIAGNOSIS — J449 Chronic obstructive pulmonary disease, unspecified: Secondary | ICD-10-CM | POA: Diagnosis not present

## 2017-01-16 ENCOUNTER — Telehealth: Payer: Self-pay | Admitting: Pulmonary Disease

## 2017-01-16 NOTE — Telephone Encounter (Signed)
Sherry from [facility] facility that patient was placed in after hospitalization called states that with minimal movement patient sats drop to 70% - patient also doesn't have the bipap at the facility - patient and patient son says that the settings are not right and it doesn't fit well. Judeen Hammans - is wanting to discuss how the get bipap adjusted - She can be reached at [phone] ext [phone] or station 1- pr

## 2017-01-16 NOTE — Telephone Encounter (Signed)
Spoke with pt's daughter, Butch Penny. States that pt's oxygen levels are dropping whether she is on oxygen or not. This morning her oxygen was 58%. Reece Packer that pt needs to be evaluated at a hospital. Butch Penny agreed and verbalized understanding. Nothing further was needed.

## 2017-01-18 NOTE — Telephone Encounter (Signed)
Amanda Horton called back they are wanting the bipap setting faxed to her - The rehab center can get her a bipap with their company - Fax number is [phone]. Phone number [phone] station 1-pr

## 2017-01-18 NOTE — Telephone Encounter (Signed)
Sherry with [facility] calling regarding order/status for bipap machine. CB [phone] station 1.

## 2017-01-18 NOTE — Telephone Encounter (Signed)
Sherry with [facility] calling back stating SMS is there and replace [employer]'s machine right now and he has the settings on the machine so he was able to pull the settings off of it.  [phone] stating -1

## 2017-01-19 DIAGNOSIS — J449 Chronic obstructive pulmonary disease, unspecified: Secondary | ICD-10-CM | POA: Diagnosis not present

## 2017-01-19 DIAGNOSIS — J9611 Chronic respiratory failure with hypoxia: Secondary | ICD-10-CM | POA: Diagnosis not present

## 2017-01-19 DIAGNOSIS — K219 Gastro-esophageal reflux disease without esophagitis: Secondary | ICD-10-CM | POA: Diagnosis not present

## 2017-01-19 DIAGNOSIS — I10 Essential (primary) hypertension: Secondary | ICD-10-CM | POA: Diagnosis not present

## 2017-01-19 NOTE — Telephone Encounter (Signed)
Attempted to contact Judeen Hammans but she was not currently working in the office that day. Spoke with another nurse to see if she could help me figure out what exactly [place] needed but she was unable to figure it out. Will contact [place] on Monday 01/22/17

## 2017-01-23 DIAGNOSIS — J449 Chronic obstructive pulmonary disease, unspecified: Secondary | ICD-10-CM | POA: Diagnosis not present

## 2017-01-23 DIAGNOSIS — I10 Essential (primary) hypertension: Secondary | ICD-10-CM | POA: Diagnosis not present

## 2017-01-23 DIAGNOSIS — J9611 Chronic respiratory failure with hypoxia: Secondary | ICD-10-CM | POA: Diagnosis not present

## 2017-01-23 DIAGNOSIS — K219 Gastro-esophageal reflux disease without esophagitis: Secondary | ICD-10-CM | POA: Diagnosis not present

## 2017-01-24 ENCOUNTER — Emergency Department (HOSPITAL_COMMUNITY): Payer: Medicare HMO

## 2017-01-24 ENCOUNTER — Inpatient Hospital Stay (HOSPITAL_COMMUNITY)
Admission: EM | Admit: 2017-01-24 | Discharge: 2017-02-03 | DRG: 189 | Disposition: A | Discharge status: Home / Self Care | Payer: Medicare HMO | Attending: Internal Medicine | Admitting: Internal Medicine

## 2017-01-24 ENCOUNTER — Encounter (HOSPITAL_COMMUNITY): Payer: Self-pay | Admitting: *Deleted

## 2017-01-24 ENCOUNTER — Telehealth: Payer: Self-pay | Admitting: Pulmonary Disease

## 2017-01-24 DIAGNOSIS — J9621 Acute and chronic respiratory failure with hypoxia: Secondary | ICD-10-CM | POA: Diagnosis not present

## 2017-01-24 DIAGNOSIS — I130 Hypertensive heart and chronic kidney disease with heart failure and stage 1 through stage 4 chronic kidney disease, or unspecified chronic kidney disease: Secondary | ICD-10-CM | POA: Diagnosis present

## 2017-01-24 DIAGNOSIS — N184 Chronic kidney disease, stage 4 (severe): Secondary | ICD-10-CM | POA: Diagnosis present

## 2017-01-24 DIAGNOSIS — I447 Left bundle-branch block, unspecified: Secondary | ICD-10-CM | POA: Diagnosis present

## 2017-01-24 DIAGNOSIS — D631 Anemia in chronic kidney disease: Secondary | ICD-10-CM | POA: Diagnosis present

## 2017-01-24 DIAGNOSIS — Z79899 Other long term (current) drug therapy: Secondary | ICD-10-CM

## 2017-01-24 DIAGNOSIS — Z7982 Long term (current) use of aspirin: Secondary | ICD-10-CM

## 2017-01-24 DIAGNOSIS — I2781 Cor pulmonale (chronic): Secondary | ICD-10-CM | POA: Diagnosis present

## 2017-01-24 DIAGNOSIS — S81801D Unspecified open wound, right lower leg, subsequent encounter: Secondary | ICD-10-CM | POA: Diagnosis not present

## 2017-01-24 DIAGNOSIS — M6281 Muscle weakness (generalized): Secondary | ICD-10-CM | POA: Diagnosis not present

## 2017-01-24 DIAGNOSIS — J441 Chronic obstructive pulmonary disease with (acute) exacerbation: Secondary | ICD-10-CM

## 2017-01-24 DIAGNOSIS — J9622 Acute and chronic respiratory failure with hypercapnia: Secondary | ICD-10-CM

## 2017-01-24 DIAGNOSIS — Z87891 Personal history of nicotine dependence: Secondary | ICD-10-CM

## 2017-01-24 DIAGNOSIS — J9811 Atelectasis: Secondary | ICD-10-CM | POA: Diagnosis present

## 2017-01-24 DIAGNOSIS — G4733 Obstructive sleep apnea (adult) (pediatric): Secondary | ICD-10-CM

## 2017-01-24 DIAGNOSIS — E785 Hyperlipidemia, unspecified: Secondary | ICD-10-CM | POA: Diagnosis present

## 2017-01-24 DIAGNOSIS — I872 Venous insufficiency (chronic) (peripheral): Secondary | ICD-10-CM | POA: Diagnosis present

## 2017-01-24 DIAGNOSIS — R7303 Prediabetes: Secondary | ICD-10-CM | POA: Diagnosis present

## 2017-01-24 DIAGNOSIS — F329 Major depressive disorder, single episode, unspecified: Secondary | ICD-10-CM | POA: Diagnosis present

## 2017-01-24 DIAGNOSIS — G9340 Encephalopathy, unspecified: Secondary | ICD-10-CM | POA: Diagnosis not present

## 2017-01-24 DIAGNOSIS — G9341 Metabolic encephalopathy: Secondary | ICD-10-CM | POA: Diagnosis present

## 2017-01-24 DIAGNOSIS — Z66 Do not resuscitate: Secondary | ICD-10-CM | POA: Diagnosis not present

## 2017-01-24 DIAGNOSIS — I5042 Chronic combined systolic (congestive) and diastolic (congestive) heart failure: Secondary | ICD-10-CM | POA: Diagnosis not present

## 2017-01-24 DIAGNOSIS — K219 Gastro-esophageal reflux disease without esophagitis: Secondary | ICD-10-CM | POA: Diagnosis present

## 2017-01-24 DIAGNOSIS — N189 Chronic kidney disease, unspecified: Secondary | ICD-10-CM

## 2017-01-24 DIAGNOSIS — Z6841 Body Mass Index (BMI) 40.0 and over, adult: Secondary | ICD-10-CM | POA: Diagnosis not present

## 2017-01-24 DIAGNOSIS — Z515 Encounter for palliative care: Secondary | ICD-10-CM | POA: Diagnosis not present

## 2017-01-24 DIAGNOSIS — I5043 Acute on chronic combined systolic (congestive) and diastolic (congestive) heart failure: Secondary | ICD-10-CM | POA: Diagnosis present

## 2017-01-24 DIAGNOSIS — R0600 Dyspnea, unspecified: Secondary | ICD-10-CM | POA: Diagnosis not present

## 2017-01-24 DIAGNOSIS — I10 Essential (primary) hypertension: Secondary | ICD-10-CM | POA: Diagnosis present

## 2017-01-24 DIAGNOSIS — E874 Mixed disorder of acid-base balance: Secondary | ICD-10-CM | POA: Diagnosis present

## 2017-01-24 DIAGNOSIS — J9601 Acute respiratory failure with hypoxia: Secondary | ICD-10-CM | POA: Diagnosis not present

## 2017-01-24 DIAGNOSIS — Z9181 History of falling: Secondary | ICD-10-CM | POA: Diagnosis not present

## 2017-01-24 DIAGNOSIS — R9431 Abnormal electrocardiogram [ECG] [EKG]: Secondary | ICD-10-CM | POA: Diagnosis not present

## 2017-01-24 DIAGNOSIS — M47816 Spondylosis without myelopathy or radiculopathy, lumbar region: Secondary | ICD-10-CM | POA: Diagnosis present

## 2017-01-24 DIAGNOSIS — S81802D Unspecified open wound, left lower leg, subsequent encounter: Secondary | ICD-10-CM | POA: Diagnosis not present

## 2017-01-24 DIAGNOSIS — J810 Acute pulmonary edema: Secondary | ICD-10-CM | POA: Diagnosis not present

## 2017-01-24 DIAGNOSIS — F5104 Psychophysiologic insomnia: Secondary | ICD-10-CM | POA: Diagnosis present

## 2017-01-24 DIAGNOSIS — F419 Anxiety disorder, unspecified: Secondary | ICD-10-CM | POA: Diagnosis present

## 2017-01-24 DIAGNOSIS — R0602 Shortness of breath: Secondary | ICD-10-CM | POA: Diagnosis present

## 2017-01-24 DIAGNOSIS — Z7189 Other specified counseling: Secondary | ICD-10-CM | POA: Diagnosis not present

## 2017-01-24 DIAGNOSIS — R069 Unspecified abnormalities of breathing: Secondary | ICD-10-CM | POA: Diagnosis not present

## 2017-01-24 DIAGNOSIS — G939 Disorder of brain, unspecified: Secondary | ICD-10-CM

## 2017-01-24 DIAGNOSIS — J9602 Acute respiratory failure with hypercapnia: Secondary | ICD-10-CM | POA: Diagnosis present

## 2017-01-24 DIAGNOSIS — R0689 Other abnormalities of breathing: Secondary | ICD-10-CM

## 2017-01-24 DIAGNOSIS — M810 Age-related osteoporosis without current pathological fracture: Secondary | ICD-10-CM | POA: Diagnosis present

## 2017-01-24 DIAGNOSIS — Z9981 Dependence on supplemental oxygen: Secondary | ICD-10-CM

## 2017-01-24 DIAGNOSIS — N183 Chronic kidney disease, stage 3 (moderate): Secondary | ICD-10-CM | POA: Diagnosis not present

## 2017-01-24 LAB — BLOOD GAS, ARTERIAL
ACID-BASE EXCESS: 19.5 mmol/L — AB (ref 0.0–2.0)
BICARBONATE: 46.6 mmol/L — AB (ref 20.0–28.0)
Drawn by: 418751
O2 Content: 2 L/min
O2 Saturation: 98.2 %
PCO2 ART: 94.5 mmHg — AB (ref 32.0–48.0)
PH ART: 7.314 — AB (ref 7.350–7.450)
PO2 ART: 111 mmHg — AB (ref 83.0–108.0)
Patient temperature: 98.6

## 2017-01-24 LAB — CBC
HCT: 32 % — ABNORMAL LOW (ref 36.0–46.0)
Hemoglobin: 8.9 g/dL — ABNORMAL LOW (ref 12.0–15.0)
MCH: 24.9 pg — ABNORMAL LOW (ref 26.0–34.0)
MCHC: 27.8 g/dL — AB (ref 30.0–36.0)
MCV: 89.4 fL (ref 78.0–100.0)
PLATELETS: 243 10*3/uL (ref 150–400)
RBC: 3.58 MIL/uL — ABNORMAL LOW (ref 3.87–5.11)
RDW: 15.8 % — AB (ref 11.5–15.5)
WBC: 6.8 10*3/uL (ref 4.0–10.5)

## 2017-01-24 LAB — BASIC METABOLIC PANEL
ANION GAP: 10 (ref 5–15)
BUN: 8 mg/dL (ref 6–20)
CALCIUM: 9 mg/dL (ref 8.9–10.3)
CO2: 42 mmol/L — ABNORMAL HIGH (ref 22–32)
Chloride: 90 mmol/L — ABNORMAL LOW (ref 101–111)
Creatinine, Ser: 1.52 mg/dL — ABNORMAL HIGH (ref 0.44–1.00)
GFR calc Af Amer: 39 mL/min — ABNORMAL LOW (ref >60)
GFR, EST NON AFRICAN AMERICAN: 33 mL/min — AB (ref >60)
GLUCOSE: 121 mg/dL — AB (ref 65–99)
Potassium: 4.1 mmol/L (ref 3.5–5.1)
SODIUM: 142 mmol/L (ref 135–145)

## 2017-01-24 LAB — I-STAT TROPONIN, ED: TROPONIN I, POC: 0 ng/mL (ref 0.00–0.08)

## 2017-01-24 MED ORDER — ALBUTEROL (5 MG/ML) CONTINUOUS INHALATION SOLN
10.0000 mg/h | INHALATION_SOLUTION | Freq: Once | RESPIRATORY_TRACT | Status: AC
  Administered 2017-01-24: 10 mg/h via RESPIRATORY_TRACT
  Filled 2017-01-24: qty 20

## 2017-01-24 MED ORDER — METHYLPREDNISOLONE SODIUM SUCC 125 MG IJ SOLR
125.0000 mg | Freq: Once | INTRAMUSCULAR | Status: AC
Start: 2017-01-24 — End: 2017-01-24
  Administered 2017-01-24: 125 mg via INTRAVENOUS
  Filled 2017-01-24: qty 2

## 2017-01-24 NOTE — ED Triage Notes (Addendum)
Pt arrived by [facility] for sob, wheezing, cough recently. spo2 98%. Pt declined breathing tx pta. Swelling noted to bilateral legs. Per family, pt is always altered but has been more confused lately. Unknown lsn.

## 2017-01-24 NOTE — Telephone Encounter (Signed)
Please see phone message that was placed today 01/24/17 that was sent to AD

## 2017-01-24 NOTE — ED Notes (Signed)
Pt placed on bipap, daughter now at bedside

## 2017-01-24 NOTE — Progress Notes (Signed)
Changes made per ABG

## 2017-01-24 NOTE — Progress Notes (Signed)
MD and RN aware of ABG

## 2017-01-24 NOTE — Telephone Encounter (Signed)
Yes, pls order ONO on Bipap and 2L O2. I have NOT seen this pt but has a f/u with me.  Based on EPIC, pt is on 2L o2 and BipaP.   Monica Becton, MD 01/24/2017, 10:41 AM [facility] Pager [phone] After 3 pm or if no answer, call [phone]

## 2017-01-24 NOTE — Progress Notes (Signed)
Placed on BiPAP at this time. Pt tolerating well

## 2017-01-24 NOTE — Progress Notes (Signed)
Changes made per ABG and Dr Kathrynn Humble verbal order

## 2017-01-24 NOTE — ED Notes (Signed)
EDP at bedside, pt hard to arouse.

## 2017-01-24 NOTE — ED Notes (Signed)
Patient transported to x-ray. ?

## 2017-01-24 NOTE — Telephone Encounter (Signed)
Called and spoke with pt and she stated that she is now home from rehab and she is able to do the ONO at home.  AD please advise if you still want to set this up for the pt.  thanks

## 2017-01-24 NOTE — ED Notes (Signed)
Per EDP, ABG and bipap

## 2017-01-24 NOTE — Telephone Encounter (Signed)
Spoke with Amanda Horton and she is aware that of AD message. I have placed the order for the ONO. I will follow up with daughter since she was at work to find out when the best time to schedule a follow up appointment since she usually comes with her mom. She was at work when I called. A placed a reminder to myself. Nothing further is needed at this time.

## 2017-01-25 ENCOUNTER — Encounter (HOSPITAL_COMMUNITY): Payer: Self-pay | Admitting: Internal Medicine

## 2017-01-25 DIAGNOSIS — Z7189 Other specified counseling: Secondary | ICD-10-CM | POA: Diagnosis not present

## 2017-01-25 DIAGNOSIS — E785 Hyperlipidemia, unspecified: Secondary | ICD-10-CM | POA: Diagnosis present

## 2017-01-25 DIAGNOSIS — J810 Acute pulmonary edema: Secondary | ICD-10-CM | POA: Diagnosis not present

## 2017-01-25 DIAGNOSIS — E874 Mixed disorder of acid-base balance: Secondary | ICD-10-CM | POA: Diagnosis present

## 2017-01-25 DIAGNOSIS — J9622 Acute and chronic respiratory failure with hypercapnia: Secondary | ICD-10-CM | POA: Diagnosis present

## 2017-01-25 DIAGNOSIS — F5104 Psychophysiologic insomnia: Secondary | ICD-10-CM | POA: Diagnosis present

## 2017-01-25 DIAGNOSIS — R7303 Prediabetes: Secondary | ICD-10-CM | POA: Diagnosis present

## 2017-01-25 DIAGNOSIS — I872 Venous insufficiency (chronic) (peripheral): Secondary | ICD-10-CM | POA: Diagnosis present

## 2017-01-25 DIAGNOSIS — J9602 Acute respiratory failure with hypercapnia: Secondary | ICD-10-CM | POA: Diagnosis present

## 2017-01-25 DIAGNOSIS — I447 Left bundle-branch block, unspecified: Secondary | ICD-10-CM | POA: Diagnosis present

## 2017-01-25 DIAGNOSIS — Z6841 Body Mass Index (BMI) 40.0 and over, adult: Secondary | ICD-10-CM | POA: Diagnosis not present

## 2017-01-25 DIAGNOSIS — M47816 Spondylosis without myelopathy or radiculopathy, lumbar region: Secondary | ICD-10-CM | POA: Diagnosis present

## 2017-01-25 DIAGNOSIS — G9341 Metabolic encephalopathy: Secondary | ICD-10-CM | POA: Diagnosis present

## 2017-01-25 DIAGNOSIS — D631 Anemia in chronic kidney disease: Secondary | ICD-10-CM | POA: Diagnosis present

## 2017-01-25 DIAGNOSIS — F419 Anxiety disorder, unspecified: Secondary | ICD-10-CM | POA: Diagnosis present

## 2017-01-25 DIAGNOSIS — F329 Major depressive disorder, single episode, unspecified: Secondary | ICD-10-CM | POA: Diagnosis present

## 2017-01-25 DIAGNOSIS — N183 Chronic kidney disease, stage 3 (moderate): Secondary | ICD-10-CM | POA: Diagnosis not present

## 2017-01-25 DIAGNOSIS — J9811 Atelectasis: Secondary | ICD-10-CM | POA: Diagnosis present

## 2017-01-25 DIAGNOSIS — R0602 Shortness of breath: Secondary | ICD-10-CM | POA: Diagnosis present

## 2017-01-25 DIAGNOSIS — G9340 Encephalopathy, unspecified: Secondary | ICD-10-CM | POA: Diagnosis not present

## 2017-01-25 DIAGNOSIS — J9621 Acute and chronic respiratory failure with hypoxia: Secondary | ICD-10-CM | POA: Diagnosis not present

## 2017-01-25 DIAGNOSIS — I130 Hypertensive heart and chronic kidney disease with heart failure and stage 1 through stage 4 chronic kidney disease, or unspecified chronic kidney disease: Secondary | ICD-10-CM | POA: Diagnosis present

## 2017-01-25 DIAGNOSIS — I2781 Cor pulmonale (chronic): Secondary | ICD-10-CM | POA: Diagnosis present

## 2017-01-25 DIAGNOSIS — I10 Essential (primary) hypertension: Secondary | ICD-10-CM | POA: Diagnosis not present

## 2017-01-25 DIAGNOSIS — I5043 Acute on chronic combined systolic (congestive) and diastolic (congestive) heart failure: Secondary | ICD-10-CM | POA: Diagnosis present

## 2017-01-25 DIAGNOSIS — K219 Gastro-esophageal reflux disease without esophagitis: Secondary | ICD-10-CM | POA: Diagnosis present

## 2017-01-25 DIAGNOSIS — Z87891 Personal history of nicotine dependence: Secondary | ICD-10-CM | POA: Diagnosis not present

## 2017-01-25 DIAGNOSIS — G4733 Obstructive sleep apnea (adult) (pediatric): Secondary | ICD-10-CM | POA: Diagnosis present

## 2017-01-25 DIAGNOSIS — M810 Age-related osteoporosis without current pathological fracture: Secondary | ICD-10-CM | POA: Diagnosis present

## 2017-01-25 DIAGNOSIS — Z515 Encounter for palliative care: Secondary | ICD-10-CM | POA: Diagnosis not present

## 2017-01-25 DIAGNOSIS — N184 Chronic kidney disease, stage 4 (severe): Secondary | ICD-10-CM | POA: Diagnosis present

## 2017-01-25 DIAGNOSIS — J441 Chronic obstructive pulmonary disease with (acute) exacerbation: Secondary | ICD-10-CM | POA: Diagnosis present

## 2017-01-25 LAB — TROPONIN I: Troponin I: <0.03 ng/mL (ref <0.03)

## 2017-01-25 LAB — BLOOD GAS, ARTERIAL
ACID-BASE EXCESS: 19.7 mmol/L — AB (ref 0.0–2.0)
ACID-BASE EXCESS: 18.7 mmol/L — AB (ref 0.0–2.0)
BICARBONATE: 46.9 mmol/L — AB (ref 20.0–28.0)
BICARBONATE: 45.5 mmol/L — AB (ref 20.0–28.0)
DELIVERY SYSTEMS: POSITIVE
DRAWN BY: 418751
Delivery systems: POSITIVE
Drawn by: 418751
EXPIRATORY PAP: 8
Expiratory PAP: 10
FIO2: 40
FIO2: 40
INSPIRATORY PAP: 16
INSPIRATORY PAP: 20
LHR: 16 {breaths}/min
Mode: POSITIVE
O2 Saturation: 95.8 %
O2 Saturation: 97.3 %
Patient temperature: 98.6
Patient temperature: 98.6
RATE: 14 resp/min
pCO2 arterial: 88.7 mmHg (ref 32.0–48.0)
pCO2 arterial: 95 mmHg (ref 32.0–48.0)
pH, Arterial: 7.314 — ABNORMAL LOW (ref 7.350–7.450)
pH, Arterial: 7.33 — ABNORMAL LOW (ref 7.350–7.450)
pO2, Arterial: 84 mmHg (ref 83.0–108.0)
pO2, Arterial: 92.6 mmHg (ref 83.0–108.0)

## 2017-01-25 LAB — COMPREHENSIVE METABOLIC PANEL
ALT: 10 U/L — AB (ref 14–54)
AST: 15 U/L (ref 15–41)
Albumin: 2.9 g/dL — ABNORMAL LOW (ref 3.5–5.0)
Alkaline Phosphatase: 53 U/L (ref 38–126)
Anion gap: 11 (ref 5–15)
BUN: 10 mg/dL (ref 6–20)
CHLORIDE: 89 mmol/L — AB (ref 101–111)
CO2: 40 mmol/L — AB (ref 22–32)
CREATININE: 1.55 mg/dL — AB (ref 0.44–1.00)
Calcium: 8.6 mg/dL — ABNORMAL LOW (ref 8.9–10.3)
GFR calc Af Amer: 38 mL/min — ABNORMAL LOW (ref >60)
GFR calc non Af Amer: 33 mL/min — ABNORMAL LOW (ref >60)
Glucose, Bld: 137 mg/dL — ABNORMAL HIGH (ref 65–99)
Potassium: 3.9 mmol/L (ref 3.5–5.1)
SODIUM: 140 mmol/L (ref 135–145)
Total Bilirubin: 0.8 mg/dL (ref 0.3–1.2)
Total Protein: 6.3 g/dL — ABNORMAL LOW (ref 6.5–8.1)

## 2017-01-25 LAB — POCT I-STAT 3, ART BLOOD GAS (G3+)
ACID-BASE EXCESS: 18 mmol/L — AB (ref 0.0–2.0)
Bicarbonate: 45 mmol/L — ABNORMAL HIGH (ref 20.0–28.0)
O2 Saturation: 90 %
PH ART: 7.44 (ref 7.350–7.450)
TCO2: 47 mmol/L (ref 0–100)
pCO2 arterial: 66.2 mmHg (ref 32.0–48.0)
pO2, Arterial: 59 mmHg — ABNORMAL LOW (ref 83.0–108.0)

## 2017-01-25 LAB — CBC
HCT: 29.4 % — ABNORMAL LOW (ref 36.0–46.0)
HEMOGLOBIN: 8.3 g/dL — AB (ref 12.0–15.0)
MCH: 25.1 pg — ABNORMAL LOW (ref 26.0–34.0)
MCHC: 28.2 g/dL — AB (ref 30.0–36.0)
MCV: 88.8 fL (ref 78.0–100.0)
Platelets: 225 10*3/uL (ref 150–400)
RBC: 3.31 MIL/uL — ABNORMAL LOW (ref 3.87–5.11)
RDW: 15.4 % (ref 11.5–15.5)
WBC: 8 10*3/uL (ref 4.0–10.5)

## 2017-01-25 LAB — MAGNESIUM: Magnesium: 2.1 mg/dL (ref 1.7–2.4)

## 2017-01-25 LAB — MRSA PCR SCREENING: MRSA BY PCR: NEGATIVE

## 2017-01-25 LAB — INFLUENZA PANEL BY PCR (TYPE A & B)
INFLAPCR: NEGATIVE
INFLBPCR: NEGATIVE

## 2017-01-25 LAB — PHOSPHORUS: PHOSPHORUS: 3.2 mg/dL (ref 2.5–4.6)

## 2017-01-25 LAB — BRAIN NATRIURETIC PEPTIDE: B Natriuretic Peptide: 106.2 pg/mL — ABNORMAL HIGH (ref 0.0–100.0)

## 2017-01-25 MED ORDER — FAMOTIDINE 20 MG PO TABS
20.0000 mg | ORAL_TABLET | Freq: Two times a day (BID) | ORAL | Status: DC
  Administered 2017-01-25 – 2017-02-03 (×19): 20 mg via ORAL
  Filled 2017-01-25 (×21): qty 1

## 2017-01-25 MED ORDER — METOPROLOL TARTRATE 50 MG PO TABS
50.0000 mg | ORAL_TABLET | Freq: Two times a day (BID) | ORAL | Status: DC
  Administered 2017-01-25 – 2017-02-03 (×18): 50 mg via ORAL
  Filled 2017-01-25 (×21): qty 1

## 2017-01-25 MED ORDER — PANTOPRAZOLE SODIUM 40 MG PO TBEC
40.0000 mg | DELAYED_RELEASE_TABLET | Freq: Every day | ORAL | Status: DC
  Administered 2017-01-25 – 2017-01-30 (×6): 40 mg via ORAL
  Filled 2017-01-25 (×7): qty 1

## 2017-01-25 MED ORDER — ENOXAPARIN SODIUM 40 MG/0.4ML ~~LOC~~ SOLN
40.0000 mg | SUBCUTANEOUS | Status: DC
Start: 2017-01-25 — End: 2017-02-03
  Administered 2017-01-25 – 2017-02-03 (×10): 40 mg via SUBCUTANEOUS
  Filled 2017-01-25 (×11): qty 0.4

## 2017-01-25 MED ORDER — TEMAZEPAM 7.5 MG PO CAPS
15.0000 mg | ORAL_CAPSULE | Freq: Every evening | ORAL | Status: DC | PRN
  Filled 2017-01-25: qty 1

## 2017-01-25 MED ORDER — LEVOFLOXACIN IN D5W 750 MG/150ML IV SOLN
750.0000 mg | Freq: Every day | INTRAVENOUS | Status: DC
  Filled 2017-01-25: qty 150

## 2017-01-25 MED ORDER — DOXYCYCLINE HYCLATE 100 MG IV SOLR
100.0000 mg | Freq: Once | INTRAVENOUS | Status: AC
  Administered 2017-01-25: 100 mg via INTRAVENOUS
  Filled 2017-01-25: qty 100

## 2017-01-25 MED ORDER — SODIUM CHLORIDE 0.9% FLUSH
3.0000 mL | Freq: Two times a day (BID) | INTRAVENOUS | Status: DC
  Administered 2017-01-25 – 2017-02-03 (×16): 3 mL via INTRAVENOUS

## 2017-01-25 MED ORDER — METHYLPREDNISOLONE SODIUM SUCC 40 MG IJ SOLR
40.0000 mg | Freq: Four times a day (QID) | INTRAMUSCULAR | Status: DC
  Administered 2017-01-25 – 2017-01-28 (×13): 40 mg via INTRAVENOUS
  Filled 2017-01-25 (×14): qty 1

## 2017-01-25 MED ORDER — GUAIFENESIN ER 600 MG PO TB12
600.0000 mg | ORAL_TABLET | Freq: Two times a day (BID) | ORAL | Status: DC
  Administered 2017-01-25 – 2017-01-29 (×9): 600 mg via ORAL
  Filled 2017-01-25 (×12): qty 1

## 2017-01-25 MED ORDER — FUROSEMIDE 40 MG PO TABS
60.0000 mg | ORAL_TABLET | Freq: Every day | ORAL | Status: DC
  Administered 2017-01-25 – 2017-01-27 (×3): 60 mg via ORAL
  Filled 2017-01-25 (×3): qty 1

## 2017-01-25 MED ORDER — AMLODIPINE BESYLATE 5 MG PO TABS
2.5000 mg | ORAL_TABLET | Freq: Every day | ORAL | Status: DC
  Administered 2017-01-25 – 2017-02-03 (×10): 2.5 mg via ORAL
  Filled 2017-01-25 (×11): qty 1

## 2017-01-25 MED ORDER — IPRATROPIUM-ALBUTEROL 0.5-2.5 (3) MG/3ML IN SOLN
3.0000 mL | Freq: Four times a day (QID) | RESPIRATORY_TRACT | Status: DC
  Administered 2017-01-25 – 2017-01-28 (×16): 3 mL via RESPIRATORY_TRACT
  Filled 2017-01-25 (×16): qty 3

## 2017-01-25 MED ORDER — LORAZEPAM 2 MG/ML IJ SOLN
0.5000 mg | INTRAMUSCULAR | Status: DC | PRN
  Administered 2017-01-30: 0.5 mg via INTRAVENOUS
  Filled 2017-01-25: qty 1

## 2017-01-25 MED ORDER — ALBUTEROL SULFATE (2.5 MG/3ML) 0.083% IN NEBU: 2.5000 mg | INHALATION_SOLUTION | RESPIRATORY_TRACT | Status: DC | PRN

## 2017-01-25 MED ORDER — LEVOFLOXACIN IN D5W 750 MG/150ML IV SOLN
750.0000 mg | Freq: Once | INTRAVENOUS | Status: AC
  Administered 2017-01-25: 750 mg via INTRAVENOUS
  Filled 2017-01-25: qty 150

## 2017-01-25 MED ORDER — OXYCODONE-ACETAMINOPHEN 5-325 MG PO TABS
1.0000 | ORAL_TABLET | Freq: Three times a day (TID) | ORAL | Status: DC | PRN
  Administered 2017-01-30: 1 via ORAL
  Filled 2017-01-25 (×2): qty 1

## 2017-01-25 MED ORDER — LEVOFLOXACIN IN D5W 750 MG/150ML IV SOLN
750.0000 mg | INTRAVENOUS | Status: DC
  Administered 2017-01-27: 750 mg via INTRAVENOUS
  Filled 2017-01-25: qty 150

## 2017-01-25 MED ORDER — ADULT MULTIVITAMIN W/MINERALS CH
1.0000 | ORAL_TABLET | Freq: Every day | ORAL | Status: DC
  Administered 2017-01-25 – 2017-02-03 (×9): 1 via ORAL
  Filled 2017-01-25 (×10): qty 1

## 2017-01-25 MED ORDER — POTASSIUM CHLORIDE CRYS ER 20 MEQ PO TBCR
20.0000 meq | EXTENDED_RELEASE_TABLET | Freq: Every day | ORAL | Status: DC
  Administered 2017-01-25 – 2017-02-01 (×7): 20 meq via ORAL
  Filled 2017-01-25 (×4): qty 1
  Filled 2017-01-25: qty 2
  Filled 2017-01-25 (×3): qty 1

## 2017-01-25 MED ORDER — LINACLOTIDE 145 MCG PO CAPS
145.0000 ug | ORAL_CAPSULE | Freq: Every day | ORAL | Status: DC
Start: 2017-01-25 — End: 2017-02-03
  Administered 2017-01-25 – 2017-02-03 (×7): 145 ug via ORAL
  Filled 2017-01-25 (×12): qty 1

## 2017-01-25 MED ORDER — ESCITALOPRAM OXALATE 10 MG PO TABS
20.0000 mg | ORAL_TABLET | Freq: Every day | ORAL | Status: DC
  Administered 2017-01-25 – 2017-01-29 (×5): 20 mg via ORAL
  Filled 2017-01-25: qty 2
  Filled 2017-01-25: qty 1
  Filled 2017-01-25: qty 2
  Filled 2017-01-25 (×2): qty 1

## 2017-01-25 MED ORDER — ASPIRIN 81 MG PO CHEW
81.0000 mg | CHEWABLE_TABLET | Freq: Every day | ORAL | Status: DC
  Administered 2017-01-25 – 2017-02-03 (×10): 81 mg via ORAL
  Filled 2017-01-25 (×11): qty 1

## 2017-01-25 NOTE — Progress Notes (Signed)
This note also relates to the following rows which could not be included: SpO2 - Cannot attach notes to unvalidated device data  Pt taken off Bipap and placed on nasal cannula 3L with humidity. Pt tolerating it well at this time. RT will continue to monitor.

## 2017-01-25 NOTE — Progress Notes (Signed)
PHARMACY NOTE:  ANTIMICROBIAL RENAL DOSAGE ADJUSTMENT  Current antimicrobial regimen includes a mismatch between antimicrobial dosage and estimated renal function.  As per policy approved by the Pharmacy & Therapeutics and Medical Executive Committees, the antimicrobial dosage will be adjusted accordingly.  Current antimicrobial dosage:  Levaquin 750mg  IV q24h  Indication: COPD exacerbation  Renal Function:  Estimated Creatinine Clearance: 40.2 mL/min (by C-G formula based on SCr of 1.52 mg/dL (H)).    Antimicrobial dosage has been changed to:  Levaquin 750mg  IV q48h   Thank you for allowing pharmacy to be a part of this patient's care.  Sherlon Handing, PharmD, BCPS Clinical pharmacist, pager [phone] 01/25/2017 2:24 AM

## 2017-01-25 NOTE — ED Provider Notes (Addendum)
[facility] DEPT Provider Note   CSN: [id number] Arrival date & time: 01/24/17  1727     History   Chief Complaint Chief Complaint  Patient presents with  . Shortness of Breath  . Cough  . Altered Mental Status    HPI Amanda Horton is a 71 y.o. female.  HPI Pt comes in with cc of AMS. 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 LEVEL 5 CAVEAT FOR ALTERED MENTAL STATUS.  Per EMS, pt was sent via [facility] for DIB. Pt refused nebs at home. Family had reported that pt is more confused than usual. It also appears that pt has run out of her SNF days and was discharged home recently. No family at bedside.   Past Medical History:  Diagnosis Date  . Acute hyperkalemia   . Acute hypokalemia   . Acute on chronic renal failure ([place])   . Allergic rhinitis   . Anemia due to chronic kidney disease   . APC (atrial premature contractions)   . 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 Benign essential hypertension   . Borderline diabetes   . Chronic insomnia   . Chronic venous insufficiency   . COPD (chronic obstructive pulmonary disease) ([place])   . GERD (gastroesophageal reflux disease)   . Hyperlipemia   . Hypoxia   . Lumbar disc narrowing   . On home oxygen therapy   . OSA (obstructive sleep apnea)   . Osteoporosis   . Respiratory failure requiring intubation ([place])   . Spinal stenosis of lumbar region without neurogenic claudication   . Varicose veins with pain   . Vitamin B12 deficiency   . Vitamin D deficiency     Patient Active Problem List   Diagnosis Date Noted  . Acute respiratory failure with hypercapnia ([place]) 01/25/2017  . Acute respiratory failure with hypoxia and hypercapnia (HCC)   . Hypoxia   . Stasis ulcer ([place])   . Shortness of breath   . Elevated troponin   . Anemia of chronic kidney failure, stage 4  (severe) ([place])   . Venous stasis ulcer limited to breakdown of skin with varicose veins ([place])   . COPD exacerbation ([place]) 12/03/2016  . Sepsis due to pneumonia ([place]) 12/03/2016  . Leukocytosis 12/03/2016  . Troponin level elevated 12/03/2016  . Chronic combined systolic and diastolic CHF (congestive heart failure) ([place]) 12/03/2016  . Acute renal failure superimposed on stage 3 chronic kidney disease ([place]) 12/03/2016  . Anemia of chronic kidney failure 12/03/2016  . Metabolic alkalosis 0000000  . Anxiety and depression 12/03/2016  . Acute on chronic respiratory failure with hypoxia ([place]) 12/03/2016  . Essential hypertension 08/27/2016  . GERD (gastroesophageal reflux disease)   . OSA (obstructive sleep apnea)     Past Surgical History:  Procedure Laterality Date  . APPLICATION OF A-CELL OF EXTREMITY Right 08/30/2016   Procedure: APPLICATION OF A-CELL OF EXTREMITY;  Surgeon: Loel Lofty Dillingham, DO;  Location: [facility];  Service: Plastics;  Laterality: Right;  . APPLICATION OF A-CELL OF EXTREMITY Right 09/20/2016   Procedure: APPLICATION OF A-CELL OF right lower leg wound;  Surgeon: Loel Lofty Dillingham, DO;  Location: [facility];  Service: Plastics;  Laterality: Right;  . APPLICATION OF WOUND VAC Right 08/30/2016   Procedure: APPLICATION OF WOUND VAC;  Surgeon: Loel Lofty Dillingham, DO;  Location: [facility];  Service: Plastics;  Laterality: Right;  . APPLICATION OF WOUND VAC Right 09/20/2016   Procedure: APPLICATION OF WOUND VAC right lower leg wound;  Surgeon: Loel Lofty Dillingham, DO;  Location: [facility];  Service: Plastics;  Laterality: Right;  . I&D EXTREMITY Right 08/30/2016   Procedure: IRRIGATION AND DEBRIDEMENT EXTREMITY RIGHT;  Surgeon: Loel Lofty Dillingham, DO;  Location: [facility];  Service: Plastics;  Laterality: Right;  . INCISION AND DRAINAGE OF WOUND Right 09/20/2016   Procedure: IRRIGATION AND DEBRIDEMENT right lower leg wound;  Surgeon: Loel Lofty Dillingham, DO;  Location: [facility];  Service:  Plastics;  Laterality: Right;  . PARTIAL HYSTERECTOMY      OB History    No data available       Home Medications    Prior to Admission medications   Medication Sig Start Date End Date Taking? Authorizing Provider  alendronate (FOSAMAX) 70 MG tablet Take 70 mg by mouth every Saturday. Take with a full glass of water on an empty stomach.    Yes Historical Provider, MD  amLODipine (NORVASC) 2.5 MG tablet Take 1 tablet (2.5 mg total) by mouth daily. 12/12/16  Yes Courage Emokpae, MD  escitalopram (LEXAPRO) 20 MG tablet Take 20 mg by mouth at bedtime.   Yes Historical Provider, MD  fluticasone furoate-vilanterol (BREO ELLIPTA) 200-25 MCG/INH AEPB Inhale 1 puff into the lungs daily.   Yes Historical Provider, MD  furosemide (LASIX) 20 MG tablet Take 60 mg by mouth daily.   Yes Historical Provider, MD  linaclotide (LINZESS) 145 MCG CAPS capsule Take 145 mcg by mouth daily before breakfast.   Yes Historical Provider, MD  omeprazole (PRILOSEC) 40 MG capsule Take 1 capsule by mouth daily. 11/06/14  Yes Historical Provider, MD  Vitamin D, Ergocalciferol, (DRISDOL) 50000 UNITS CAPS capsule Take 1 capsule by mouth every Saturday.  11/06/14  Yes Historical Provider, MD  albuterol (PROVENTIL HFA;VENTOLIN HFA) 108 (90 BASE) MCG/ACT inhaler Inhale 2 puffs into the lungs every 6 (six) hours as needed for wheezing or shortness of breath.    Historical Provider, MD  aspirin 81 MG tablet Take 81 mg by mouth daily.    Historical Provider, MD  Calcium Carbonate-Vitamin D 600-400 MG-UNIT per tablet Take 1 tablet by mouth 2 (two) times daily.    Historical Provider, MD  Cholecalciferol (VITAMIN D PO) Take 5,000 Units by mouth daily.    Historical Provider, MD  cyanocobalamin 500 MCG tablet Take 500 mcg by mouth daily.    Historical Provider, MD  guaiFENesin (MUCINEX) 600 MG 12 hr tablet Take 1 tablet (600 mg total) by mouth 2 (two) times daily. 12/11/16   Courage Emokpae, MD  ipratropium-albuterol (DUONEB) 0.5-2.5 (3)  MG/3ML SOLN Take 3 mLs by nebulization 2 (two) times daily. 12/13/16   Bonnielee Haff, MD  LORazepam (ATIVAN) 1 MG tablet Take 1 tablet (1 mg total) by mouth every 8 (eight) hours as needed for anxiety. 12/13/16   Bonnielee Haff, MD  metoprolol (LOPRESSOR) 50 MG tablet Take 1 tablet (50 mg total) by mouth 2 (two) times daily. 12/11/16   Courage Denton Brick, MD  mometasone-formoterol (DULERA) 200-5 MCG/ACT AERO Inhale 2 puffs into the lungs 2 (two) times daily. 12/29/16   Tammy S Parrett, NP  ondansetron (ZOFRAN) 4 MG tablet Take 1 tablet (4 mg total) by mouth every 6 (six) hours as needed for nausea. 12/11/16   Roxan Hockey, MD  oxyCODONE-acetaminophen (PERCOCET/ROXICET) 5-325 MG tablet Take 1 tablet by mouth every 8 (eight) hours  as needed for moderate pain. 12/13/16   Bonnielee Haff, MD  potassium chloride SA (K-DUR,KLOR-CON) 20 MEQ tablet Take 1 tablet (20 mEq total) by mouth daily. 12/11/16   Courage Denton Brick, MD  ranitidine (ZANTAC) 150 MG tablet Take 1 tablet by mouth 2 (two) times daily. 11/06/14   Historical Provider, MD  temazepam (RESTORIL) 15 MG capsule Take 1 capsule (15 mg total) by mouth at bedtime as needed for sleep. 12/13/16   Bonnielee Haff, MD  Tiotropium Bromide Monohydrate 2.5 MCG/ACT AERS Inhale 2 puffs into the lungs daily. 12/11/16   Roxan Hockey, MD    Family History Family History  Problem Relation Age of Onset  . Emphysema Father   . Asthma Father   . Throat cancer Mother     Social History Social History  Substance Use Topics  . Smoking status: Former Smoker    Packs/day: 1.00    Years: 55.00    Types: Cigarettes    Quit date: 12/04/2012  . Smokeless tobacco: Never Used     Comment: Stopped in 2014  . Alcohol use No     Allergies   Sulfa antibiotics   Review of Systems Review of Systems  Unable to perform ROS: Acuity of condition     Physical Exam Updated Vital Signs BP 141/58   Pulse 70   Temp 98.6 F (37 C) (Oral)   Resp 19   SpO2 95%   Physical  Exam  Constitutional: She appears well-developed.  HENT:  Head: Normocephalic and atraumatic.  Eyes: EOM are normal.  Cardiovascular: Normal rate.   Pulmonary/Chest: Effort normal.  Abdominal: Bowel sounds are normal.  Neurological:  Somnolent, arousable  Skin: Skin is warm and dry.  Nursing note and vitals reviewed.    ED Treatments / Results  Labs (all labs ordered are listed, but only abnormal results are displayed) Labs Reviewed  BASIC METABOLIC PANEL - Abnormal; Notable for the following:       Result Value   Chloride 90 (*)    CO2 42 (*)    Glucose, Bld 121 (*)    Creatinine, Ser 1.52 (*)    GFR calc non Af Amer 33 (*)    GFR calc Af Amer 39 (*)    All other components within normal limits  CBC - Abnormal; Notable for the following:    RBC 3.58 (*)    Hemoglobin 8.9 (*)    HCT 32.0 (*)    MCH 24.9 (*)    MCHC 27.8 (*)    RDW 15.8 (*)    All other components within normal limits  BLOOD GAS, ARTERIAL - Abnormal; Notable for the following:    pH, Arterial 7.314 (*)    pCO2 arterial 94.5 (*)    pO2, Arterial 111 (*)    Bicarbonate 46.6 (*)    Acid-Base Excess 19.5 (*)    All other components within normal limits  BLOOD GAS, ARTERIAL - Abnormal; Notable for the following:    pH, Arterial 7.314 (*)    pCO2 arterial 95.0 (*)    Bicarbonate 46.9 (*)    Acid-Base Excess 19.7 (*)    All other components within normal limits  BLOOD GAS, ARTERIAL - Abnormal; Notable for the following:    pH, Arterial 7.330 (*)    pCO2 arterial 88.7 (*)    Bicarbonate 45.5 (*)    Acid-Base Excess 18.7 (*)    All other components within normal limits  TROPONIN I  BRAIN NATRIURETIC PEPTIDE  MAGNESIUM  PHOSPHORUS  Randolm Idol, ED    EKG  EKG Interpretation  Date/Time:  Wednesday January 24 2017 17:37:48 EST Ventricular Rate:  65 PR Interval:    QRS Duration: 142 QT Interval:  495 QTC Calculation: 515 R Axis:   54 Text Interpretation:  Sinus rhythm Left bundle branch  block No acute changes No significant change since last tracing Confirmed by Kathrynn Humble, MD, Thelma Comp [phone]) on 01/24/2017 5:46:41 PM       Radiology Dg Chest 2 View  Result Date: 01/24/2017 CLINICAL DATA:  Shortness of breath and wheezing EXAM: CHEST  2 VIEW COMPARISON:  12/12/2016 FINDINGS: Mild to moderate cardiomegaly without overt failure. No effusion. Increasing bibasilar atelectasis or developing infiltrates. No pneumothorax. IMPRESSION: 1. Increasing bibasilar atelectasis or infiltrate 2. Stable cardiomegaly without overt failure Electronically Signed   By: Donavan Foil M.D.   On: 01/24/2017 18:20    Procedures Procedures (including critical care time)  CRITICAL CARE Performed by: Varney Biles   Total critical care time: 85 minutes  Critical care time was exclusive of separately billable procedures and treating other patients.  Critical care was necessary to treat or prevent imminent or life-threatening deterioration.  Critical care was time spent personally by me on the following activities: development of treatment plan with patient and/or surrogate as well as nursing, discussions with consultants, evaluation of patient's response to treatment, examination of patient, obtaining history from patient or surrogate, ordering and performing treatments and interventions, ordering and review of laboratory studies, ordering and review of radiographic studies, pulse oximetry and re-evaluation of patient's condition.   Medications Ordered in ED Medications  doxycycline (VIBRAMYCIN) 100 mg in dextrose 5 % 250 mL IVPB (100 mg Intravenous New Bag/Given 01/25/17 0030)  albuterol (PROVENTIL,VENTOLIN) solution continuous neb (10 mg/hr Nebulization Given 01/24/17 2318)  methylPREDNISolone sodium succinate (SOLU-MEDROL) 125 mg/2 mL injection 125 mg (125 mg Intravenous Given 01/24/17 2317)     Initial Impression / Assessment and Plan / ED Course  I have reviewed the triage vital signs and the  nursing notes.  Pertinent labs & imaging results that were available during my care of the patient were reviewed by me and considered in my medical decision making (see chart for details).  Clinical Course as of Jan 25 57  Thu Jan 25, 2017  0057 Clinically - there is no infection. Not sepsis. DG Chest 2 View [AN]    Clinical Course User Index [AN] Varney Biles, MD    DDx includes: ICH / Stroke ACS Sepsis syndrome Infection - UTI/Pneumonia Encephalopathy  Electrolyte abnormality Drug overdose Hypercapnia / COPD Hypoxia  Pt comes in with AMS. She is noted to be somnolent when I saw her and clinical concerns are high for acute hypercapnic resp failure and CO2-narcosis. ABG ordered and pt started on Bipap.  Pt initially was placed on 14/5 IPAP/EPEP with RR of 14. Repeat ABG showed no significant change to the ABG - so we changed the setting to 20/8 and RR of 16. Pt clinically was more alert prior to the adjustment, so we decided not to intubate. Pt's final ABG shows improvement in the hypercapnia and pt is more alert (still somnolent). We will admit to stepdown.  Nebs started. Steroid and doxy given. Not sepsis. I think this is just poor compliance from pt, as she wasn't taking nebs or using bipap for her OSA.   Final Clinical Impressions(s) / ED Diagnoses   Final diagnoses:  Acute on chronic respiratory failure with hypercapnia ([place])  Encephalomyopathy  COPD exacerbation ([place])  New Prescriptions New Prescriptions   No medications on file     Varney Biles, MD 01/25/17 [place], MD 01/25/17 [place], MD 01/25/17 [id number]

## 2017-01-25 NOTE — Progress Notes (Signed)
ABG critical values given to H. Stone, Therapist, sports at [phone] by S.Dalan Cowger, RRT on 01/25/2017.

## 2017-01-25 NOTE — Progress Notes (Signed)
Transported pt from ED B16 to 4NP10 without any complications.

## 2017-01-25 NOTE — Progress Notes (Signed)
Patient seen and examined this morning, admitted overnight by Dr. Olevia Bowens, H&P reviewed and agreed with the assessment and plan.  In brief, pleasant 71 year old female with chronic kidney disease stage III-IV, COPD, hypertension, hyperlipidemia, obstructive sleep apnea who was admitted on 2/22 with progressive shortness of breath, requiring BiPAP in the emergency room.    Acute respiratory failure with hypercapnia (HCC) due to COPD exacerbation ([place]) -Continue monitoring stepdown, she is still on BiPAP this morning, attempt to wean off BiPAP per respiratory therapy.  Continue IV steroids, Levaquin empirically as well as scheduled DuoNeb's.  Will rule out influenza.  GERD (gastroesophageal reflux disease) - Protonix 40 mg by mouth daily.  OSA (obstructive sleep apnea) - On BiPAP ventilation at this time.  Essential hypertension -Continue home medications  Chronic kidney disease stage III-IV -Creatinine appears at baseline,    Anemia of chronic kidney failure - Monitor hematocrit and hemoglobin, no bleeding, stable  Anxiety and depression - continue home medications  Tykiera Raven M. Cruzita Lederer, MD [facility] [phone]  If 7PM-7AM, please contact night-coverage [email or web address] Password [facility]  01/12/2017, 5:37 AM

## 2017-01-25 NOTE — Progress Notes (Signed)
Nutrition Brief Note  Patient identified on the Malnutrition Screening Tool (MST) Report  Wt Readings from Last 15 Encounters:  01/25/17 248 lb (112.5 kg)  01/09/17 248 lb (112.5 kg)  12/29/16 226 lb (102.5 kg)  12/12/16 237 lb 6.4 oz (107.7 kg)  09/26/16 240 lb (108.9 kg)  09/20/16 248 lb (112.5 kg)  09/01/16 248 lb (112.5 kg)  05/04/15 254 lb 12.8 oz (115.6 kg)  03/23/15 257 lb 9.6 oz (116.8 kg)  01/19/15 259 lb (117.5 kg)    Body mass index is 45.36 kg/m. Patient meets criteria for Morbid Obesity based on current BMI.   Current diet order is Heart Healthy. Pt was on Bi-Pap this morning and was unable to eat breakfast. She reports having a good appetite and eating well PTA. RD reviewed pt's diet recall. She usually eats breakfast and dinner and snacks in the middle of the day on peanut butter crackers, cereal, or granola bars. Diet includes very low intake of fruits and vegetables. RD recommended increasing intake of fruits and vegetables. Discussed ways to add fruits and vegetables to meals and snacks. Reviewed list of vitamin C-rich fruits and vegetables. Recommend taking daily multivitamin with minerals daily until diet quality improves. Labs and medications reviewed.   No additional nutrition interventions warranted at this time. If additional nutrition issues arise, please re-consult RD.   Scarlette Ar RD, LDN, CSP Inpatient Clinical Dietitian Pager: [phone] After Hours Pager: [phone]

## 2017-01-25 NOTE — H&P (Signed)
History and Physical    Amanda Horton [id number] DOB: 1946-03-11 DOA: 01/24/2017  PCP: Nicoletta Dress, MD   Patient coming from: Home.  Chief Complaint: Shortness of breath.  HPI: Amanda Horton is a 71 y.o. female with medical history significant of chronic kidney disease, anemia, osteoarthritis of lumbar spine, hypertension, borderline diabetes, chronic insomnia, chronic venous insufficiency, GERD, hyperlipidemia, osteoporosis, obstructive sleep apnea, COPD who is coming to the emergency department for evaluation of worsening confusion, worsening dyspnea, wheezing and productive cough. The patient is somnolent, in no further information is available at this time. She was admitted for acute on chronic respiratory failure on 12/03/2016 and discharged on 12/13/2016. The patient was on Tamala Julian, but was discharged home and as apparently her Medicare days for SNF were used. However, family members are concerned that the patient has become increasingly more confused lately. No family was present in the ED to provide further history.  ED Course: The patient was started on supplemental oxygen, did not respond to nasal cannula, then was started BiPAP ventilation. She also received bronchodilators and IV doxycycline. Serial ABGs showed slow decrease in CO2 and improvement on pH. WBC 6.8, hemoglobin 8.9 g/dL, platelets 243. Her troponin level was negative, BNP was 106, sodium 142, potassium 4.1, chloride 90, CO2 42, BUN 8, creatinine 1.52 and glucose 121.  Imaging: Chest radiograph showed stable cardiomegaly and increased bibasilar atelectasis or infiltrates.   Review of Systems: As per HPI otherwise 10 point review of systems negative.    Past Medical History:  Diagnosis Date  . Acute hyperkalemia   . Acute hypokalemia   . Acute on chronic renal failure ([place])   . Allergic rhinitis   . Anemia due to chronic kidney disease   . APC (atrial premature contractions)   . Arthritis of lumbar spine  ([place])   . Benign essential hypertension   . Borderline diabetes   . Chronic insomnia   . Chronic venous insufficiency   . COPD (chronic obstructive pulmonary disease) ([place])   . GERD (gastroesophageal reflux disease)   . Hyperlipemia   . Hypoxia   . Lumbar disc narrowing   . On home oxygen therapy   . OSA (obstructive sleep apnea)   . Osteoporosis   . Respiratory failure requiring intubation ([place])   . Spinal stenosis of lumbar region without neurogenic claudication   . Varicose veins with pain   . Vitamin B12 deficiency   . Vitamin D deficiency     Past Surgical History:  Procedure Laterality Date  . APPLICATION OF A-CELL OF EXTREMITY Right 08/30/2016   Procedure: APPLICATION OF A-CELL OF EXTREMITY;  Surgeon: Loel Lofty Dillingham, DO;  Location: [facility];  Service: Plastics;  Laterality: Right;  . APPLICATION OF A-CELL OF EXTREMITY Right 09/20/2016   Procedure: APPLICATION OF A-CELL OF right lower leg wound;  Surgeon: Loel Lofty Dillingham, DO;  Location: [facility];  Service: Plastics;  Laterality: Right;  . APPLICATION OF WOUND VAC Right 08/30/2016   Procedure: APPLICATION OF WOUND VAC;  Surgeon: Loel Lofty Dillingham, DO;  Location: [facility];  Service: Plastics;  Laterality: Right;  . APPLICATION OF WOUND VAC Right 09/20/2016   Procedure: APPLICATION OF WOUND VAC right lower leg wound;  Surgeon: Loel Lofty Dillingham, DO;  Location: [facility];  Service: Plastics;  Laterality: Right;  . I&D EXTREMITY Right 08/30/2016   Procedure: IRRIGATION AND DEBRIDEMENT EXTREMITY RIGHT;  Surgeon: Loel Lofty Dillingham, DO;  Location: [facility];  Service: Plastics;  Laterality: Right;  . INCISION AND DRAINAGE  OF WOUND Right 09/20/2016   Procedure: IRRIGATION AND DEBRIDEMENT right lower leg wound;  Surgeon: Loel Lofty Dillingham, DO;  Location: [facility];  Service: Plastics;  Laterality: Right;  . PARTIAL HYSTERECTOMY       reports that she quit smoking about 4 years ago. Her smoking use included Cigarettes. She has a 55.00  pack-year smoking history. She has never used smokeless tobacco. She reports that she does not drink alcohol or use drugs.  Allergies  Allergen Reactions  . Sulfa Antibiotics Nausea And Vomiting and Other (See Comments)    "Stayed sick"    Family History  Problem Relation Age of Onset  . Emphysema Father   . Asthma Father   . Throat cancer Mother     Prior to Admission medications   Medication Sig Start Date End Date Taking? Authorizing Provider  alendronate (FOSAMAX) 70 MG tablet Take 70 mg by mouth every Saturday. Take with a full glass of water on an empty stomach.    Yes Historical Provider, MD  amLODipine (NORVASC) 2.5 MG tablet Take 1 tablet (2.5 mg total) by mouth daily. 12/12/16  Yes Courage Emokpae, MD  escitalopram (LEXAPRO) 20 MG tablet Take 20 mg by mouth at bedtime.   Yes Historical Provider, MD  fluticasone furoate-vilanterol (BREO ELLIPTA) 200-25 MCG/INH AEPB Inhale 1 puff into the lungs daily.   Yes Historical Provider, MD  furosemide (LASIX) 20 MG tablet Take 60 mg by mouth daily.   Yes Historical Provider, MD  linaclotide (LINZESS) 145 MCG CAPS capsule Take 145 mcg by mouth daily before breakfast.   Yes Historical Provider, MD  omeprazole (PRILOSEC) 40 MG capsule Take 1 capsule by mouth daily. 11/06/14  Yes Historical Provider, MD  Vitamin D, Ergocalciferol, (DRISDOL) 50000 UNITS CAPS capsule Take 1 capsule by mouth every Saturday.  11/06/14  Yes Historical Provider, MD  albuterol (PROVENTIL HFA;VENTOLIN HFA) 108 (90 BASE) MCG/ACT inhaler Inhale 2 puffs into the lungs every 6 (six) hours as needed for wheezing or shortness of breath.    Historical Provider, MD  aspirin 81 MG tablet Take 81 mg by mouth daily.    Historical Provider, MD  Calcium Carbonate-Vitamin D 600-400 MG-UNIT per tablet Take 1 tablet by mouth 2 (two) times daily.    Historical Provider, MD  Cholecalciferol (VITAMIN D PO) Take 5,000 Units by mouth daily.    Historical Provider, MD  cyanocobalamin 500 MCG  tablet Take 500 mcg by mouth daily.    Historical Provider, MD  guaiFENesin (MUCINEX) 600 MG 12 hr tablet Take 1 tablet (600 mg total) by mouth 2 (two) times daily. 12/11/16   Courage Emokpae, MD  ipratropium-albuterol (DUONEB) 0.5-2.5 (3) MG/3ML SOLN Take 3 mLs by nebulization 2 (two) times daily. 12/13/16   Bonnielee Haff, MD  LORazepam (ATIVAN) 1 MG tablet Take 1 tablet (1 mg total) by mouth every 8 (eight) hours as needed for anxiety. 12/13/16   Bonnielee Haff, MD  metoprolol (LOPRESSOR) 50 MG tablet Take 1 tablet (50 mg total) by mouth 2 (two) times daily. 12/11/16   Courage Denton Brick, MD  mometasone-formoterol (DULERA) 200-5 MCG/ACT AERO Inhale 2 puffs into the lungs 2 (two) times daily. 12/29/16   Tammy S Parrett, NP  ondansetron (ZOFRAN) 4 MG tablet Take 1 tablet (4 mg total) by mouth every 6 (six) hours as needed for nausea. 12/11/16   Roxan Hockey, MD  oxyCODONE-acetaminophen (PERCOCET/ROXICET) 5-325 MG tablet Take 1 tablet by mouth every 8 (eight) hours as needed for moderate pain. 12/13/16  Bonnielee Haff, MD  potassium chloride SA (K-DUR,KLOR-CON) 20 MEQ tablet Take 1 tablet (20 mEq total) by mouth daily. 12/11/16   Courage Denton Brick, MD  ranitidine (ZANTAC) 150 MG tablet Take 1 tablet by mouth 2 (two) times daily. 11/06/14   Historical Provider, MD  temazepam (RESTORIL) 15 MG capsule Take 1 capsule (15 mg total) by mouth at bedtime as needed for sleep. 12/13/16   Bonnielee Haff, MD  Tiotropium Bromide Monohydrate 2.5 MCG/ACT AERS Inhale 2 puffs into the lungs daily. 12/11/16   Roxan Hockey, MD    Physical Exam:  Constitutional: NAD, calm, comfortable Vitals:   01/25/17 0011 01/25/17 0030 01/25/17 0100 01/25/17 0203  BP:  133/65 115/62   Pulse:  70 65   Resp:  23 20   Temp:      TempSrc:      SpO2: 95% 95% 94%   Weight:    112.5 kg (248 lb)   Eyes: PERRL, lids and conjunctivae normal ENMT: BiPAP mask on.  Neck: normal, supple, no masses, no thyromegaly Respiratory: On BiPAP ventilation,  mild rhonchi bilaterally. Normal respiratory effort. No accessory muscle use.  Cardiovascular: Regular rate and rhythm, no murmurs / rubs / gallops. No extremity edema. 2+ pedal pulses. No carotid bruits.  Abdomen: Soft, no tenderness, no masses palpated. No hepatosplenomegaly. Bowel sounds positive.  Musculoskeletal: no clubbing / cyanosis. Good ROM, no contractures. Normal muscle tone.  Skin: Skin looks wrinkled with multiple areas of ecchymosis on extremities. Neurologic: Sleepy while on BiPAP ventilation. Unable to fully evaluate. Psychiatric: Sleeping while on BiPAP ventilation.   Labs on Admission: I have personally reviewed following labs and imaging studies  CBC:  Recent Labs Lab 01/24/17 1743  WBC 6.8  HGB 8.9*  HCT 32.0*  MCV 89.4  PLT 0000000   Basic Metabolic Panel:  Recent Labs Lab 01/24/17 1743 01/25/17 0057  NA 142  --   K 4.1  --   CL 90*  --   CO2 42*  --   GLUCOSE 121*  --   BUN 8  --   CREATININE 1.52*  --   CALCIUM 9.0  --   MG  --  2.1  PHOS  --  3.2   GFR: Estimated Creatinine Clearance: 40.2 mL/min (by C-G formula based on SCr of 1.52 mg/dL (H)). Liver Function Tests: No results for input(s): AST, ALT, ALKPHOS, BILITOT, PROT, ALBUMIN in the last 168 hours. No results for input(s): LIPASE, AMYLASE in the last 168 hours. No results for input(s): AMMONIA in the last 168 hours. Coagulation Profile: No results for input(s): INR, PROTIME in the last 168 hours. Cardiac Enzymes:  Recent Labs Lab 01/25/17 0057  TROPONINI <0.03   BNP (last 3 results) No results for input(s): PROBNP in the last 8760 hours. HbA1C: No results for input(s): HGBA1C in the last 72 hours. CBG: No results for input(s): GLUCAP in the last 168 hours. Lipid Profile: No results for input(s): CHOL, HDL, LDLCALC, TRIG, CHOLHDL, LDLDIRECT in the last 72 hours. Thyroid Function Tests: No results for input(s): TSH, T4TOTAL, FREET4, T3FREE, THYROIDAB in the last 72 hours. Anemia  Panel: No results for input(s): VITAMINB12, FOLATE, FERRITIN, TIBC, IRON, RETICCTPCT in the last 72 hours. Urine analysis:    Component Value Date/Time   COLORURINE YELLOW 12/03/2016 1535   APPEARANCEUR HAZY (A) 12/03/2016 1535   LABSPEC 1.013 12/03/2016 1535   PHURINE 5.0 12/03/2016 1535   GLUCOSEU NEGATIVE 12/03/2016 1535   HGBUR MODERATE (A) 12/03/2016 1535   BILIRUBINUR NEGATIVE  12/03/2016 [place] 12/03/2016 1535   PROTEINUR 100 (A) 12/03/2016 1535   NITRITE NEGATIVE 12/03/2016 1535   LEUKOCYTESUR NEGATIVE 12/03/2016 1535    Radiological Exams on Admission: Dg Chest 2 View  Result Date: 01/24/2017 CLINICAL DATA:  Shortness of breath and wheezing EXAM: CHEST  2 VIEW COMPARISON:  12/12/2016 FINDINGS: Mild to moderate cardiomegaly without overt failure. No effusion. Increasing bibasilar atelectasis or developing infiltrates. No pneumothorax. IMPRESSION: 1. Increasing bibasilar atelectasis or infiltrate 2. Stable cardiomegaly without overt failure Electronically Signed   By: Donavan Foil M.D.   On: 01/24/2017 18:20    EKG: Independently reviewed. Vent. rate 65 BPM PR interval * ms QRS duration 142 ms QT/QTc 495/515 ms P-R-T axes 74 54 46 Sinus rhythm Left bundle branch block No acute changes No significant change since last tracing  Assessment/Plan Principal Problem:   Acute respiratory failure with hypercapnia (HCC)   COPD exacerbation (HCC) Admit to stepdown/inpatient. Continue supplemental oxygen. Continue BiPAP ventilation. Continue scheduled and as needed bronchodilators. Continue Solu-Medrol 40 mg every 6 hours IVP. Levaquin 750 mg IVP every 24 hours.  Active Problems:   GERD (gastroesophageal reflux disease) Protonix 40 mg by mouth daily.    OSA (obstructive sleep apnea) On BiPAP ventilation at this time.    Essential hypertension Continue amlodipine 2.5 mg by mouth daily. Continue furosemide 60 mg by mouth daily. Continue potassium  supplementation. Continue metoprolol 50 mg by mouth twice a day. Monitor blood pressure, renal function and electrolytes.      Anemia of chronic kidney failure Monitor hematocrit and hemoglobin.    Anxiety and depression Continue Lexapro 20 mg by mouth at bedtime. Lorazepam 0.5 mg every 4 hours as needed for anxiety while on BiPAP. Hold oral lorazepam. Continue Temazepam 15 mg by mouth at bedtime.    DVT prophylaxis: Lovenox SQ. Code Status: Full code. Family Communication:  Disposition Plan: Admit for BiPAP ventilation and COPD exacerbation treatment. Consults called:  Admission status: Stepdown/inpatient.   Reubin Milan MD [facility] Pager [phone].  If 7PM-7AM, please contact night-coverage [email or web address] Password [email or web address]  01/25/2017, 3:00 AM

## 2017-01-25 NOTE — Telephone Encounter (Signed)
Butch Penny called back to inform us that mother is back in the hospital - pr

## 2017-01-25 NOTE — Telephone Encounter (Signed)
Forwarding to me to follow up on

## 2017-01-25 NOTE — Care Management Note (Signed)
Case Management Note  Patient Details  Name: Amanda Horton MRN: [id number] Date of Birth: Apr 04, 1946  Subjective/Objective:     Adm w resp failure, on bipap here               Action/Plan:spoke w pt. She has bipap at home w [facility] but not sure her machine working correctly. Spoke w [facility] and da had also called to have eq checked. [facility] resp there going to home today. Pt had sleep study w dr Merian Capron.   Expected Discharge Date:                  Expected Discharge Plan:     In-House Referral:  Clinical Social Work  Discharge planning Services  CM Consult  Post Acute Care Choice:    Choice offered to:     DME Arranged:  Bipap DME Agency:  [facility]  HH Arranged:    [place] Agency:     Status of Service:  In process, will continue to follow  If discussed at Long Length of Stay Meetings, dates discussed:    Additional Comments:cont to follow.  Lacretia Leigh, RN 01/25/2017, 12:35 PM

## 2017-01-26 LAB — BASIC METABOLIC PANEL
ANION GAP: 10 (ref 5–15)
BUN: 17 mg/dL (ref 6–20)
CALCIUM: 8.9 mg/dL (ref 8.9–10.3)
CHLORIDE: 89 mmol/L — AB (ref 101–111)
CO2: 39 mmol/L — AB (ref 22–32)
Creatinine, Ser: 1.64 mg/dL — ABNORMAL HIGH (ref 0.44–1.00)
GFR calc Af Amer: 35 mL/min — ABNORMAL LOW (ref >60)
GFR calc non Af Amer: 30 mL/min — ABNORMAL LOW (ref >60)
GLUCOSE: 167 mg/dL — AB (ref 65–99)
Potassium: 4.2 mmol/L (ref 3.5–5.1)
Sodium: 138 mmol/L (ref 135–145)

## 2017-01-26 LAB — CBC
HCT: 27.5 % — ABNORMAL LOW (ref 36.0–46.0)
Hemoglobin: 8 g/dL — ABNORMAL LOW (ref 12.0–15.0)
MCH: 24.9 pg — ABNORMAL LOW (ref 26.0–34.0)
MCHC: 29.1 g/dL — ABNORMAL LOW (ref 30.0–36.0)
MCV: 85.7 fL (ref 78.0–100.0)
Platelets: 244 10*3/uL (ref 150–400)
RBC: 3.21 MIL/uL — ABNORMAL LOW (ref 3.87–5.11)
RDW: 15.6 % — AB (ref 11.5–15.5)
WBC: 8.7 10*3/uL (ref 4.0–10.5)

## 2017-01-26 NOTE — Progress Notes (Signed)
PROGRESS NOTE  Amanda Horton [id number] DOB: 05/18/1946 DOA: 01/24/2017 PCP: Nicoletta Dress, MD   LOS: 1 day   Brief Narrative: 71 year old female with chronic kidney disease stage III-IV, COPD, hypertension, hyperlipidemia, obstructive sleep apnea who was admitted on 2/22 with progressive shortness of breath, requiring BiPAP in the emergency room.  Assessment & Plan: Principal Problem:   Acute respiratory failure with hypercapnia (HCC) Active Problems:   GERD (gastroesophageal reflux disease)   OSA (obstructive sleep apnea)   Essential hypertension   COPD exacerbation (HCC)   Anemia of chronic kidney failure   Anxiety and depression   Acute on chronic respiratory failure with hypoxia and hypercapnia (HCC) due to COPD exacerbation ([place]) -Patient was able to be a nasal cannula yesterday, changed her BiPAP to QHS -Stable this morning, no complaints, -Consulted care manager to assist this patient has been having difficulties getting her BiPAP arranged at home, suspect part of reason why she is rehospitalized -Mobilize more today, asked PT to consult -Continue IV steroids, transition to p.o. tomorrow and anticipate transfer to floor either later today or tomorrow based on respiratory status.  Influenza was negative.  Continue Levaquin empirically.  GERD (gastroesophageal reflux disease) - Protonix 40 mg by mouth daily.  OSA (obstructive sleep apnea) - On BiPAP QHS, patient has been having difficulties getting this arranged at home and thus she has had progressive hypercarbia requiring hospitalization  Essential hypertension -Continue home medications  Chronic kidney disease stage III-IV -Creatinine appears at baseline,  Anemia of chronic kidney failure -Monitor hematocrit and hemoglobin, no bleeding, stable  Anxiety and depression - continue home medications   DVT prophylaxis: Lovenox Code Status: Full Family Communication: no family at bedside, d/w  yesterday with daughter and sister bedside Disposition Plan: TBD  Consultants:   None  Procedures:   BiPAP  Antimicrobials:  Levaquin 2/22 >>   Subjective: -Feeling better this morning, she is breathing better  Objective: Vitals:   01/26/17 0400 01/26/17 0523 01/26/17 0700 01/26/17 0922  BP:   127/61   Pulse: 69 77 68 (!) 113  Resp: 17 16 15  (!) 26  Temp:   97.8 F (36.6 C)   TempSrc:   Axillary   SpO2: 95% 97% 96% (!) 80%  Weight:      Height:        Intake/Output Summary (Last 24 hours) at 01/26/17 0934 Last data filed at 01/26/17 0500  Gross per 24 hour  Intake              123 ml  Output              325 ml  Net             -202 ml   Filed Weights   01/25/17 0203  Weight: 112.5 kg (248 lb)    Examination: Constitutional: NAD, BiPAP on Vitals:   01/26/17 0400 01/26/17 0523 01/26/17 0700 01/26/17 0922  BP:   127/61   Pulse: 69 77 68 (!) 113  Resp: 17 16 15  (!) 26  Temp:   97.8 F (36.6 C)   TempSrc:   Axillary   SpO2: 95% 97% 96% (!) 80%  Weight:      Height:       Eyes: PERRL, lids and conjunctivae normal Respiratory: clear to auscultation bilaterally, no wheezing, no crackles. Normal respiratory effort.  Cardiovascular: Regular rate and rhythm, no murmurs / rubs / gallops. No LE edema. 2+ pedal pulses.  Abdomen: no tenderness. Bowel sounds positive.  Musculoskeletal: no clubbing / cyanosis.  Skin: no rashes, lesions, ulcers. No induration.  Chronic venous stasis changes bilateral lower extremities Neurologic: non focal   Psychiatric: Normal judgment and insight. Alert and oriented x 3. Normal mood.    Data Reviewed: I have personally reviewed following labs and imaging studies  CBC:  Recent Labs Lab 01/24/17 1743 01/25/17 0344 01/26/17 0205  WBC 6.8 8.0 8.7  HGB 8.9* 8.3* 8.0*  HCT 32.0* 29.4* 27.5*  MCV 89.4 88.8 85.7  PLT 243 225 [id number]   Basic Metabolic Panel:  Recent Labs Lab 01/24/17 1743 01/25/17 0057 01/25/17 0344  01/26/17 0205  NA 142  --  140 138  K 4.1  --  3.9 4.2  CL 90*  --  89* 89*  CO2 42*  --  40* 39*  GLUCOSE 121*  --  137* 167*  BUN 8  --  10 17  CREATININE 1.52*  --  1.55* 1.64*  CALCIUM 9.0  --  8.6* 8.9  MG  --  2.1  --   --   PHOS  --  3.2  --   --    GFR: Estimated Creatinine Clearance: 37.3 mL/min (by C-G formula based on SCr of 1.64 mg/dL (H)). Liver Function Tests:  Recent Labs Lab 01/25/17 0344  AST 15  ALT 10*  ALKPHOS 53  BILITOT 0.8  PROT 6.3*  ALBUMIN 2.9*   No results for input(s): LIPASE, AMYLASE in the last 168 hours. No results for input(s): AMMONIA in the last 168 hours. Coagulation Profile: No results for input(s): INR, PROTIME in the last 168 hours. Cardiac Enzymes:  Recent Labs Lab 01/25/17 0057 01/25/17 0344  TROPONINI <0.03 <0.03   BNP (last 3 results) No results for input(s): PROBNP in the last 8760 hours. HbA1C: No results for input(s): HGBA1C in the last 72 hours. CBG: No results for input(s): GLUCAP in the last 168 hours. Lipid Profile: No results for input(s): CHOL, HDL, LDLCALC, TRIG, CHOLHDL, LDLDIRECT in the last 72 hours. Thyroid Function Tests: No results for input(s): TSH, T4TOTAL, FREET4, T3FREE, THYROIDAB in the last 72 hours. Anemia Panel: No results for input(s): VITAMINB12, FOLATE, FERRITIN, TIBC, IRON, RETICCTPCT in the last 72 hours. Urine analysis:    Component Value Date/Time   COLORURINE YELLOW 12/03/2016 1535   APPEARANCEUR HAZY (A) 12/03/2016 1535   LABSPEC 1.013 12/03/2016 1535   PHURINE 5.0 12/03/2016 1535   GLUCOSEU NEGATIVE 12/03/2016 1535   HGBUR MODERATE (A) 12/03/2016 1535   BILIRUBINUR NEGATIVE 12/03/2016 1535   KETONESUR NEGATIVE 12/03/2016 1535   PROTEINUR 100 (A) 12/03/2016 1535   NITRITE NEGATIVE 12/03/2016 1535   LEUKOCYTESUR NEGATIVE 12/03/2016 1535   Sepsis Labs: Invalid input(s): PROCALCITONIN, LACTICIDVEN  Recent Results (from the past 240 hour(s))  MRSA PCR Screening     Status: None    Collection Time: 01/25/17  2:22 AM  Result Value Ref Range Status   MRSA by PCR NEGATIVE NEGATIVE Final    Comment:        The GeneXpert MRSA Assay (FDA approved for NASAL specimens only), is one component of a comprehensive MRSA colonization surveillance program. It is not intended to diagnose MRSA infection nor to guide or monitor treatment for MRSA infections.       Radiology Studies: Dg Chest 2 View  Result Date: 01/24/2017 CLINICAL DATA:  Shortness of breath and wheezing EXAM: CHEST  2 VIEW COMPARISON:  12/12/2016 FINDINGS: Mild to moderate cardiomegaly without overt failure. No effusion. Increasing bibasilar atelectasis or developing  infiltrates. No pneumothorax. IMPRESSION: 1. Increasing bibasilar atelectasis or infiltrate 2. Stable cardiomegaly without overt failure Electronically Signed   By: Donavan Foil M.D.   On: 01/24/2017 18:20     Scheduled Meds: . amLODipine  2.5 mg Oral Daily  . aspirin  81 mg Oral Daily  . enoxaparin (LOVENOX) injection  40 mg Subcutaneous Q24H  . escitalopram  20 mg Oral QHS  . famotidine  20 mg Oral BID  . furosemide  60 mg Oral Daily  . guaiFENesin  600 mg Oral BID  . ipratropium-albuterol  3 mL Nebulization Q6H  . [START ON 01/27/2017] levofloxacin (LEVAQUIN) IV  750 mg Intravenous Q48H  . linaclotide  145 mcg Oral QAC breakfast  . methylPREDNISolone (SOLU-MEDROL) injection  40 mg Intravenous Q6H  . metoprolol  50 mg Oral BID  . multivitamin with minerals  1 tablet Oral Daily  . pantoprazole  40 mg Oral Daily  . potassium chloride SA  20 mEq Oral Daily  . sodium chloride flush  3 mL Intravenous Q12H   Continuous Infusions:   Marzetta Board, MD, PhD [facility] Pager [phone]  If 7PM-7AM, please contact night-coverage [email or web address] Password [facility] 01/26/2017, 9:34 AM

## 2017-01-27 ENCOUNTER — Inpatient Hospital Stay (HOSPITAL_COMMUNITY): Payer: Medicare HMO

## 2017-01-27 MED ORDER — LEVOFLOXACIN 750 MG PO TABS
750.0000 mg | ORAL_TABLET | ORAL | Status: DC
  Administered 2017-01-30: 750 mg via ORAL
  Filled 2017-01-27 (×3): qty 1

## 2017-01-27 NOTE — Progress Notes (Signed)
PROGRESS NOTE  Amanda Horton [id number] DOB: [birth year] DOA: 01/24/2017 PCP: Nicoletta Dress, MD   LOS: 2 days   Brief Narrative: 71 year old female with chronic kidney disease stage III-IV, COPD, hypertension, hyperlipidemia, obstructive sleep apnea who was admitted on 2/22 with progressive shortness of breath, requiring BiPAP in the emergency room.  She was admitted to stepdown, respiratory status improving, now BiPAP is q. at bedtime only  Assessment & Plan: Principal Problem:   Acute respiratory failure with hypercapnia (HCC) Active Problems:   GERD (gastroesophageal reflux disease)   OSA (obstructive sleep apnea)   Essential hypertension   COPD exacerbation (HCC)   Anemia of chronic kidney failure   Anxiety and depression   Acute on chronic respiratory failure with hypoxia and hypercapnia (HCC) due to COPD exacerbation ([place]) -Patient was able to be a nasal cannula yesterday, changed her BiPAP to QHS -She complained of several episodes of shortness of breath yesterday described as "I could not catch my breath". -Consulted care manager to assist this patient has been having difficulties getting her BiPAP arranged at home, suspect part of reason why she is rehospitalized -Continue IV steroids.  Influenza was negative.  Continue Levaquin empirically. -Given more shortness of breath yesterday, will repeat a chest x-ray today.  It would be great if she would work with physical therapy today, to assess readiness for floor transfer  GERD (gastroesophageal reflux disease) - Protonix 40 mg by mouth daily.  OSA (obstructive sleep apnea) - On BiPAP QHS, patient has been having difficulties getting this arranged at home and thus she has had progressive hypercarbia requiring hospitalization  Essential hypertension -Continue home medications  Chronic kidney disease stage III-IV -Creatinine appears at baseline, repeat renal function tomorrow morning  Anemia of chronic kidney  failure -Monitor hematocrit and hemoglobin, no bleeding, stable, repeat CBC tomorrow morning  Anxiety and depression - continue home medications   DVT prophylaxis: Lovenox Code Status: Full Family Communication: no family at bedside Disposition Plan: TBD  Consultants:   None  Procedures:   BiPAP  Antimicrobials:  Levaquin 2/22 >>   Subjective: -Feeling better this morning, she is breathing better but is concerned about several episodes of dyspnea yesterday  Objective: Vitals:   01/27/17 0400 01/27/17 0736 01/27/17 0737 01/27/17 0745  BP: 139/80  139/80 139/72  Pulse: 65  71 69  Resp: 16  15 16   Temp: 97.6 F (36.4 C)   97.4 F (36.3 C)  TempSrc: Oral   Oral  SpO2: 100% 99% 99% 98%  Weight:      Height:        Intake/Output Summary (Last 24 hours) at 01/27/17 1021 Last data filed at 01/27/17 1012  Gross per 24 hour  Intake              490 ml  Output              500 ml  Net              -10 ml   Filed Weights   01/25/17 0203  Weight: 112.5 kg (248 lb)    Examination: Constitutional: NAD, BiPAP on Vitals:   01/27/17 0400 01/27/17 0736 01/27/17 0737 01/27/17 0745  BP: 139/80  139/80 139/72  Pulse: 65  71 69  Resp: 16  15 16   Temp: 97.6 F (36.4 C)   97.4 F (36.3 C)  TempSrc: Oral   Oral  SpO2: 100% 99% 99% 98%  Weight:      Height:  Eyes: PERRL, lids and conjunctivae normal Respiratory: clear to auscultation bilaterally, no wheezing, no crackles. Normal respiratory effort.  Cardiovascular: Regular rate and rhythm, no murmurs / rubs / gallops. No LE edema. 2+ pedal pulses.  Abdomen: no tenderness. Bowel sounds positive.  Musculoskeletal: no clubbing / cyanosis.  Skin: no rashes, lesions, ulcers. No induration.  Chronic venous stasis changes bilateral lower extremities Neurologic: non focal   Psychiatric: Normal judgment and insight. Alert and oriented x 3. Normal mood.    Data Reviewed: I have personally reviewed following labs and  imaging studies  CBC:  Recent Labs Lab 01/24/17 1743 01/25/17 0344 01/26/17 0205  WBC 6.8 8.0 8.7  HGB 8.9* 8.3* 8.0*  HCT 32.0* 29.4* 27.5*  MCV 89.4 88.8 85.7  PLT 243 225 [id number]   Basic Metabolic Panel:  Recent Labs Lab 01/24/17 1743 01/25/17 0057 01/25/17 0344 01/26/17 0205  NA 142  --  140 138  K 4.1  --  3.9 4.2  CL 90*  --  89* 89*  CO2 42*  --  40* 39*  GLUCOSE 121*  --  137* 167*  BUN 8  --  10 17  CREATININE 1.52*  --  1.55* 1.64*  CALCIUM 9.0  --  8.6* 8.9  MG  --  2.1  --   --   PHOS  --  3.2  --   --    GFR: Estimated Creatinine Clearance: 37.3 mL/min (by C-G formula based on SCr of 1.64 mg/dL (H)). Liver Function Tests:  Recent Labs Lab 01/25/17 0344  AST 15  ALT 10*  ALKPHOS 53  BILITOT 0.8  PROT 6.3*  ALBUMIN 2.9*   No results for input(s): LIPASE, AMYLASE in the last 168 hours. No results for input(s): AMMONIA in the last 168 hours. Coagulation Profile: No results for input(s): INR, PROTIME in the last 168 hours. Cardiac Enzymes:  Recent Labs Lab 01/25/17 0057 01/25/17 0344  TROPONINI <0.03 <0.03   BNP (last 3 results) No results for input(s): PROBNP in the last 8760 hours. HbA1C: No results for input(s): HGBA1C in the last 72 hours. CBG: No results for input(s): GLUCAP in the last 168 hours. Lipid Profile: No results for input(s): CHOL, HDL, LDLCALC, TRIG, CHOLHDL, LDLDIRECT in the last 72 hours. Thyroid Function Tests: No results for input(s): TSH, T4TOTAL, FREET4, T3FREE, THYROIDAB in the last 72 hours. Anemia Panel: No results for input(s): VITAMINB12, FOLATE, FERRITIN, TIBC, IRON, RETICCTPCT in the last 72 hours. Urine analysis:    Component Value Date/Time   COLORURINE YELLOW 12/03/2016 1535   APPEARANCEUR HAZY (A) 12/03/2016 1535   LABSPEC 1.013 12/03/2016 1535   PHURINE 5.0 12/03/2016 1535   GLUCOSEU NEGATIVE 12/03/2016 1535   HGBUR MODERATE (A) 12/03/2016 1535   BILIRUBINUR NEGATIVE 12/03/2016 1535   KETONESUR  NEGATIVE 12/03/2016 1535   PROTEINUR 100 (A) 12/03/2016 1535   NITRITE NEGATIVE 12/03/2016 1535   LEUKOCYTESUR NEGATIVE 12/03/2016 1535   Sepsis Labs: Invalid input(s): PROCALCITONIN, LACTICIDVEN  Recent Results (from the past 240 hour(s))  MRSA PCR Screening     Status: None   Collection Time: 01/25/17  2:22 AM  Result Value Ref Range Status   MRSA by PCR NEGATIVE NEGATIVE Final    Comment:        The GeneXpert MRSA Assay (FDA approved for NASAL specimens only), is one component of a comprehensive MRSA colonization surveillance program. It is not intended to diagnose MRSA infection nor to guide or monitor treatment for MRSA infections.  Radiology Studies: No results found.   Scheduled Meds: . amLODipine  2.5 mg Oral Daily  . aspirin  81 mg Oral Daily  . enoxaparin (LOVENOX) injection  40 mg Subcutaneous Q24H  . escitalopram  20 mg Oral QHS  . famotidine  20 mg Oral BID  . furosemide  60 mg Oral Daily  . guaiFENesin  600 mg Oral BID  . ipratropium-albuterol  3 mL Nebulization Q6H  . levofloxacin (LEVAQUIN) IV  750 mg Intravenous Q48H  . linaclotide  145 mcg Oral QAC breakfast  . methylPREDNISolone (SOLU-MEDROL) injection  40 mg Intravenous Q6H  . metoprolol  50 mg Oral BID  . multivitamin with minerals  1 tablet Oral Daily  . pantoprazole  40 mg Oral Daily  . potassium chloride SA  20 mEq Oral Daily  . sodium chloride flush  3 mL Intravenous Q12H   Continuous Infusions:   Marzetta Board, MD, PhD [facility] Pager [phone]  If 7PM-7AM, please contact night-coverage [email or web address] Password [facility] 01/27/2017, 10:21 AM

## 2017-01-27 NOTE — Evaluation (Signed)
Physical Therapy Evaluation Patient Details Name: Amanda Horton MRN: [id number] DOB: 1946/06/05 Today's Date: 01/27/2017   History of Present Illness  Pt is a 71 y/o female admitted from home secondary to SOB, confusion and COPD exacerbation. PMH including but not limited to CKD, HTN and COPD (dependent on 2L of O2 at all times).  Clinical Impression  Pt presented supine in bed with HOB elevated, awake and willing to participate in therapy session. Prior to admission, pt reported that she used a RW to ambulate with and that her daughter-in-law assisted her with bathing and dressing. Pt's son stated that a family member is always at home with pt. Pt currently requires supervision for bed mobility, min A for transfers and min guard with ambulation using a RW. Pt ambulated on 3L of O2 with SPO2 maintaining >89% throughout. Pt reported increased WOB after ambulation but quickly resided with sitting rest break. Pt would continue to benefit from skilled physical therapy services at this time while admitted and after d/c to address her below listed limitations in order to improve her overall safety and independence with functional mobility.      Follow Up Recommendations Home health PT;Supervision/Assistance - 24 hour    Equipment Recommendations  None recommended by PT (pt reported having all DME at home)    Recommendations for Other Services       Precautions / Restrictions Restrictions Weight Bearing Restrictions: No      Mobility  Bed Mobility Overal bed mobility: Needs Assistance Bed Mobility: Supine to Sit     Supine to sit: Supervision     General bed mobility comments: increased time, use of bed rails, supervision for safety  Transfers Overall transfer level: Needs assistance Equipment used: Rolling walker (2 wheeled) Transfers: Sit to/from Stand Sit to Stand: Min assist         General transfer comment: increased time, min A to rise from bed into  standing  Ambulation/Gait Ambulation/Gait assistance: Min guard Ambulation Distance (Feet): 15 Feet Assistive device: Rolling walker (2 wheeled) Gait Pattern/deviations: Step-through pattern;Decreased stride length;Trunk flexed Gait velocity: decreased Gait velocity interpretation: Below normal speed for age/gender General Gait Details: mild instability with gait but no LOB or need for physical assistance, min guard for safety  Stairs            Wheelchair Mobility    Modified Rankin (Stroke Patients Only)       Balance Overall balance assessment: Needs assistance Sitting-balance support: Feet supported Sitting balance-Leahy Scale: Good     Standing balance support: During functional activity;Bilateral upper extremity supported Standing balance-Leahy Scale: Poor Standing balance comment: pt reliant on bilateral UEs on RW                             Pertinent Vitals/Pain Pain Assessment: No/denies pain    Home Living Family/patient expects to be discharged to:: Private residence Living Arrangements: Children Available Help at Discharge: Family;Available 24 hours/day Type of Home: House Home Access: Ramped entrance     Home Layout: Two level;Able to live on main level with bedroom/bathroom Home Equipment: Gilford Rile - 2 wheels;Walker - 4 wheels;Shower seat;Grab bars - tub/shower Additional Comments: son reported that one family member stays with her during the day and another family member stays with her at night    Prior Function Level of Independence: Needs assistance   Gait / Transfers Assistance Needed: pt reported that she uses a RW to ambulate  ADL's /  Homemaking Assistance Needed: pt reported that daughter-in-law assists with dressing and bathing        Hand Dominance        Extremity/Trunk Assessment   Upper Extremity Assessment Upper Extremity Assessment: Overall WFL for tasks assessed    Lower Extremity Assessment Lower Extremity  Assessment: Overall WFL for tasks assessed       Communication   Communication: No difficulties  Cognition Arousal/Alertness: Awake/alert Behavior During Therapy: WFL for tasks assessed/performed Overall Cognitive Status: Within Functional Limits for tasks assessed                      General Comments      Exercises     Assessment/Plan    PT Assessment Patient needs continued PT services  PT Problem List Decreased activity tolerance;Decreased balance;Decreased mobility;Decreased coordination;Decreased knowledge of use of DME;Decreased safety awareness;Cardiopulmonary status limiting activity       PT Treatment Interventions DME instruction;Gait training;Functional mobility training;Therapeutic activities;Balance training;Therapeutic exercise;Neuromuscular re-education;Patient/family education    PT Goals (Current goals can be found in the Care Plan section)  Acute Rehab PT Goals Patient Stated Goal: walk further distances without feeling SOB PT Goal Formulation: With patient/family Time For Goal Achievement: 02/10/17 Potential to Achieve Goals: Good    Frequency Min 3X/week   Barriers to discharge        Co-evaluation               End of Session Equipment Utilized During Treatment: Gait belt;Oxygen (3L of O2) Activity Tolerance: Patient tolerated treatment well Patient left: in chair;with call bell/phone within reach;with family/visitor present Nurse Communication: Mobility status PT Visit Diagnosis: Other abnormalities of gait and mobility (R26.89)         Time: [id number] PT Time Calculation (min) (ACUTE ONLY): 21 min   Charges:   PT Evaluation $PT Eval Moderate Complexity: 1 Procedure     PT G CodesClearnce Sorrel Tylasia Fletchall 01/27/2017, 1:25 PM Sherie Don, [place], DPT [phone]

## 2017-01-28 LAB — BASIC METABOLIC PANEL
ANION GAP: 7 (ref 5–15)
BUN: 27 mg/dL — ABNORMAL HIGH (ref 6–20)
CALCIUM: 8.9 mg/dL (ref 8.9–10.3)
CHLORIDE: 90 mmol/L — AB (ref 101–111)
CO2: 43 mmol/L — AB (ref 22–32)
Creatinine, Ser: 1.64 mg/dL — ABNORMAL HIGH (ref 0.44–1.00)
GFR calc non Af Amer: 30 mL/min — ABNORMAL LOW (ref >60)
GFR, EST AFRICAN AMERICAN: 35 mL/min — AB (ref >60)
GLUCOSE: 134 mg/dL — AB (ref 65–99)
POTASSIUM: 4.7 mmol/L (ref 3.5–5.1)
Sodium: 140 mmol/L (ref 135–145)

## 2017-01-28 LAB — CBC
HEMATOCRIT: 29.6 % — AB (ref 36.0–46.0)
HEMOGLOBIN: 8.4 g/dL — AB (ref 12.0–15.0)
MCH: 25.2 pg — ABNORMAL LOW (ref 26.0–34.0)
MCHC: 28.4 g/dL — ABNORMAL LOW (ref 30.0–36.0)
MCV: 88.9 fL (ref 78.0–100.0)
Platelets: 254 10*3/uL (ref 150–400)
RBC: 3.33 MIL/uL — AB (ref 3.87–5.11)
RDW: 16 % — ABNORMAL HIGH (ref 11.5–15.5)
WBC: 7.9 10*3/uL (ref 4.0–10.5)

## 2017-01-28 NOTE — Progress Notes (Signed)
lopressor 50 mg ordered for 2200.  Should I hold it or give it?  Thank you, Santiago Glad

## 2017-01-28 NOTE — Progress Notes (Signed)
PROGRESS NOTE  Amanda Horton [id number] DOB: 06-23-1946 DOA: 01/24/2017 PCP: Nicoletta Dress, MD   LOS: 3 days   Brief Narrative: 71 year old female with chronic kidney disease stage III-IV, COPD, hypertension, hyperlipidemia, obstructive sleep apnea who was admitted on 2/22 with progressive shortness of breath, requiring BiPAP in the emergency room.  She was admitted to stepdown, respiratory status improving, now BiPAP is q. at bedtime only  Assessment & Plan: Principal Problem:   Acute respiratory failure with hypercapnia (HCC) Active Problems:   GERD (gastroesophageal reflux disease)   OSA (obstructive sleep apnea)   Essential hypertension   COPD exacerbation (HCC)   Anemia of chronic kidney failure   Anxiety and depression   Acute on chronic respiratory failure with hypoxia and hypercapnia (HCC) due to COPD exacerbation ([place]) -Patient was able to be a nasal cannula yesterday, changed her BiPAP to QHS -She complained of several episodes of shortness of breath yesterday described as "I could not catch my breath". -Consulted care manager to assist this patient has been having difficulties getting her BiPAP arranged at home, suspect part of reason why she is rehospitalized -Continue IV steroids.  Influenza was negative.  Continue Levaquin empirically. -Improving, encourage more mobilization today, if she has a good day today anticipate discharge to home with home health PT tomorrow  GERD (gastroesophageal reflux disease) - Protonix 40 mg by mouth daily.  OSA (obstructive sleep apnea) - On BiPAP QHS, patient has been having difficulties getting this arranged at home and thus she has had progressive hypercarbia requiring hospitalization  Essential hypertension -Continue home medications  Chronic kidney disease stage III-IV -Creatinine appears at baseline, repeat renal function today with stable creatinine, BUN increased to 27, with hold Lasix today  Anemia of chronic  kidney failure -Monitor hematocrit and hemoglobin, no bleeding, stable, repeat CBC this morning with hemoglobin of 8.4, at baseline,  Anxiety and depression - continue home medications   DVT prophylaxis: Lovenox Code Status: Full Family Communication: no family at bedside Disposition Plan: TBD  Consultants:   None  Procedures:   BiPAP  Antimicrobials:  Levaquin 2/22 >>   Subjective: -Feeling better today, breathing has improved significantly  Objective: Vitals:   01/28/17 0544 01/28/17 0600 01/28/17 0745 01/28/17 0946  BP: (!) 144/64   133/60  Pulse: 71 80  75  Resp: 18 18    Temp: 98.2 F (36.8 C)     TempSrc: Oral     SpO2: 95% 99% 97%   Weight:      Height:        Intake/Output Summary (Last 24 hours) at 01/28/17 1003 Last data filed at 01/28/17 0928  Gross per 24 hour  Intake              600 ml  Output              450 ml  Net              150 ml   Filed Weights   01/25/17 0203 01/27/17 1713  Weight: 112.5 kg (248 lb) 117 kg (257 lb 15 oz)    Examination: Constitutional: NAD, BiPAP on Vitals:   01/28/17 0544 01/28/17 0600 01/28/17 0745 01/28/17 0946  BP: (!) 144/64   133/60  Pulse: 71 80  75  Resp: 18 18    Temp: 98.2 F (36.8 C)     TempSrc: Oral     SpO2: 95% 99% 97%   Weight:      Height:  Eyes: PERRL, lids and conjunctivae normal Respiratory: clear to auscultation bilaterally, no wheezing, no crackles. Normal respiratory effort.  Cardiovascular: Regular rate and rhythm, no murmurs / rubs / gallops. No LE edema. 2+ pedal pulses.  Abdomen: no tenderness. Bowel sounds positive.  Musculoskeletal: no clubbing / cyanosis.  Skin: no rashes, lesions, ulcers. No induration.  Chronic venous stasis changes bilateral lower extremities Neurologic: non focal   Psychiatric: Normal judgment and insight. Alert and oriented x 3. Normal mood.    Data Reviewed: I have personally reviewed following labs and imaging studies  CBC:  Recent  Labs Lab 01/24/17 1743 01/25/17 0344 01/26/17 0205 01/28/17 0411  WBC 6.8 8.0 8.7 7.9  HGB 8.9* 8.3* 8.0* 8.4*  HCT 32.0* 29.4* 27.5* 29.6*  MCV 89.4 88.8 85.7 88.9  PLT 243 225 244 0000000   Basic Metabolic Panel:  Recent Labs Lab 01/24/17 1743 01/25/17 0057 01/25/17 0344 01/26/17 0205 01/28/17 0411  NA 142  --  140 138 140  K 4.1  --  3.9 4.2 4.7  CL 90*  --  89* 89* 90*  CO2 42*  --  40* 39* 43*  GLUCOSE 121*  --  137* 167* 134*  BUN 8  --  10 17 27*  CREATININE 1.52*  --  1.55* 1.64* 1.64*  CALCIUM 9.0  --  8.6* 8.9 8.9  MG  --  2.1  --   --   --   PHOS  --  3.2  --   --   --    GFR: Estimated Creatinine Clearance: 38.2 mL/min (by C-G formula based on SCr of 1.64 mg/dL (H)). Liver Function Tests:  Recent Labs Lab 01/25/17 0344  AST 15  ALT 10*  ALKPHOS 53  BILITOT 0.8  PROT 6.3*  ALBUMIN 2.9*   No results for input(s): LIPASE, AMYLASE in the last 168 hours. No results for input(s): AMMONIA in the last 168 hours. Coagulation Profile: No results for input(s): INR, PROTIME in the last 168 hours. Cardiac Enzymes:  Recent Labs Lab 01/25/17 0057 01/25/17 0344  TROPONINI <0.03 <0.03   BNP (last 3 results) No results for input(s): PROBNP in the last 8760 hours. HbA1C: No results for input(s): HGBA1C in the last 72 hours. CBG: No results for input(s): GLUCAP in the last 168 hours. Lipid Profile: No results for input(s): CHOL, HDL, LDLCALC, TRIG, CHOLHDL, LDLDIRECT in the last 72 hours. Thyroid Function Tests: No results for input(s): TSH, T4TOTAL, FREET4, T3FREE, THYROIDAB in the last 72 hours. Anemia Panel: No results for input(s): VITAMINB12, FOLATE, FERRITIN, TIBC, IRON, RETICCTPCT in the last 72 hours. Urine analysis:    Component Value Date/Time   COLORURINE YELLOW 12/03/2016 1535   APPEARANCEUR HAZY (A) 12/03/2016 1535   LABSPEC 1.013 12/03/2016 1535   PHURINE 5.0 12/03/2016 1535   GLUCOSEU NEGATIVE 12/03/2016 1535   HGBUR MODERATE (A)  12/03/2016 1535   BILIRUBINUR NEGATIVE 12/03/2016 1535   KETONESUR NEGATIVE 12/03/2016 1535   PROTEINUR 100 (A) 12/03/2016 1535   NITRITE NEGATIVE 12/03/2016 1535   LEUKOCYTESUR NEGATIVE 12/03/2016 1535   Sepsis Labs: Invalid input(s): PROCALCITONIN, LACTICIDVEN  Recent Results (from the past 240 hour(s))  MRSA PCR Screening     Status: None   Collection Time: 01/25/17  2:22 AM  Result Value Ref Range Status   MRSA by PCR NEGATIVE NEGATIVE Final    Comment:        The GeneXpert MRSA Assay (FDA approved for NASAL specimens only), is one component of a comprehensive MRSA  colonization surveillance program. It is not intended to diagnose MRSA infection nor to guide or monitor treatment for MRSA infections.       Radiology Studies: Dg Chest Port 1 View  Result Date: 01/27/2017 CLINICAL DATA:  Shortness of breath. EXAM: PORTABLE CHEST 1 VIEW COMPARISON:  01/24/2017. FINDINGS: Trachea is midline. Heart is enlarged, stable. Mild scarring or atelectasis of the lung bases. Lungs are otherwise clear. No pleural fluid. IMPRESSION: Mild scarring or atelectasis at the lung bases. Electronically Signed   By: Lorin Picket M.D.   On: 01/27/2017 11:16     Scheduled Meds: . amLODipine  2.5 mg Oral Daily  . aspirin  81 mg Oral Daily  . enoxaparin (LOVENOX) injection  40 mg Subcutaneous Q24H  . escitalopram  20 mg Oral QHS  . famotidine  20 mg Oral BID  . guaiFENesin  600 mg Oral BID  . ipratropium-albuterol  3 mL Nebulization Q6H  . [START ON 01/29/2017] levofloxacin  750 mg Oral Q48H  . linaclotide  145 mcg Oral QAC breakfast  . methylPREDNISolone (SOLU-MEDROL) injection  40 mg Intravenous Q6H  . metoprolol  50 mg Oral BID  . multivitamin with minerals  1 tablet Oral Daily  . pantoprazole  40 mg Oral Daily  . potassium chloride SA  20 mEq Oral Daily  . sodium chloride flush  3 mL Intravenous Q12H   Continuous Infusions:   Marzetta Board, MD, PhD [facility] Pager  [phone]  If 7PM-7AM, please contact night-coverage [email or web address] Password [email or web address] 01/28/2017, 10:03 AM

## 2017-01-28 NOTE — Progress Notes (Signed)
Pt taken off BIPAP per her request and placed on 2.5L [place]. Pt tolerating well at this time. RT will continue to monitor.

## 2017-01-29 MED ORDER — FUROSEMIDE 40 MG PO TABS
60.0000 mg | ORAL_TABLET | Freq: Every day | ORAL | Status: DC
  Administered 2017-01-29: 60 mg via ORAL
  Filled 2017-01-29 (×2): qty 1

## 2017-01-29 MED ORDER — PREDNISONE 20 MG PO TABS
40.0000 mg | ORAL_TABLET | Freq: Every day | ORAL | Status: DC
  Administered 2017-01-29: 40 mg via ORAL
  Filled 2017-01-29 (×2): qty 2

## 2017-01-29 MED ORDER — IPRATROPIUM-ALBUTEROL 0.5-2.5 (3) MG/3ML IN SOLN
3.0000 mL | Freq: Three times a day (TID) | RESPIRATORY_TRACT | Status: DC
  Administered 2017-01-29 – 2017-01-30 (×6): 3 mL via RESPIRATORY_TRACT
  Filled 2017-01-29 (×6): qty 3

## 2017-01-29 NOTE — Progress Notes (Signed)
PROGRESS NOTE  Amanda Horton [id number] DOB: [birth year] DOA: 01/24/2017 PCP: Nicoletta Dress, MD   LOS: 4 days   Brief Narrative: 71 year old female with chronic kidney disease stage III-IV, COPD, hypertension, hyperlipidemia, obstructive sleep apnea who was admitted on 2/22 with progressive shortness of breath, requiring BiPAP in the emergency room.  She was admitted to stepdown, respiratory status improving, now BiPAP is q. at bedtime only  Assessment & Plan: Principal Problem:   Acute respiratory failure with hypercapnia (HCC) Active Problems:   GERD (gastroesophageal reflux disease)   OSA (obstructive sleep apnea)   Essential hypertension   COPD exacerbation (HCC)   Anemia of chronic kidney failure   Anxiety and depression   Acute on chronic respiratory failure with hypoxia and hypercapnia (HCC) due to COPD exacerbation ([place]) -Patient is getting close to her baseline home oxygen levels -Overall feels improved, however will still short of breath with ambulation and ongoing generalized weakness -Consulted care manager to assist this patient has been having difficulties getting her BiPAP arranged at home, suspect part of reason why she is rehospitalized -Influenza was negative.  Continue Levaquin empirically. -Convert IV steroids to prednisone today, anticipate discharge in 1 day, home with home health PT if her breathing is improving  GERD (gastroesophageal reflux disease) - Protonix 40 mg by mouth daily.  OSA (obstructive sleep apnea) -On BiPAP QHS, patient has been having difficulties getting this arranged at home and thus she has had progressive hypercarbia requiring hospitalization -Care management consulted  Essential hypertension -Continue home medications  Chronic kidney disease stage III-IV -Creatinine appears at baseline -Repeat renal function tomorrow morning, add back Lasix today  Anemia of chronic kidney failure -Monitor hematocrit and hemoglobin, no  bleeding, stable  Anxiety and depression - continue home medications   DVT prophylaxis: Lovenox Code Status: Full Family Communication: no family at bedside Disposition Plan: Home in 1 day pending stable respiratory status  Consultants:   None  Procedures:   BiPAP  Antimicrobials:  Levaquin 2/22 >> day 5 out of 7  Subjective: -Feeling better today, breathing has improved, however still not back to baseline, and ongoing weakness  Objective: Vitals:   01/28/17 2010 01/28/17 2041 01/29/17 0542 01/29/17 0900  BP:  (!) 142/74 (!) 149/70   Pulse:  78 66 75  Resp:  18 20 20   Temp:  98.1 F (36.7 C) 98.4 F (36.9 C)   TempSrc:  Oral Oral   SpO2: 97% 92% 100% 97%  Weight:      Height:        Intake/Output Summary (Last 24 hours) at 01/29/17 0941 Last data filed at 01/29/17 0341  Gross per 24 hour  Intake                0 ml  Output             1000 ml  Net            -1000 ml   Filed Weights   01/25/17 0203 01/27/17 1713  Weight: 112.5 kg (248 lb) 117 kg (257 lb 15 oz)    Examination: Constitutional: NAD, BiPAP on Vitals:   01/28/17 2010 01/28/17 2041 01/29/17 0542 01/29/17 0900  BP:  (!) 142/74 (!) 149/70   Pulse:  78 66 75  Resp:  18 20 20   Temp:  98.1 F (36.7 C) 98.4 F (36.9 C)   TempSrc:  Oral Oral   SpO2: 97% 92% 100% 97%  Weight:      Height:  Eyes: PERRL, lids and conjunctivae normal Respiratory: no wheezing, no crackles, slight coarse breath sounds at the bases. Normal respiratory effort.  Cardiovascular: Regular rate and rhythm, no murmurs / rubs / gallops. No LE edema. 2+ pedal pulses.  Abdomen: no tenderness. Bowel sounds positive.  Musculoskeletal: no clubbing / cyanosis.  Skin: no rashes, lesions, ulcers. No induration.  Chronic venous stasis changes bilateral lower extremities Neurologic: non focal   Psychiatric: Normal judgment and insight. Alert and oriented x 3. Normal mood.    Data Reviewed: I have personally reviewed  following labs and imaging studies  CBC:  Recent Labs Lab 01/24/17 1743 01/25/17 0344 01/26/17 0205 01/28/17 0411  WBC 6.8 8.0 8.7 7.9  HGB 8.9* 8.3* 8.0* 8.4*  HCT 32.0* 29.4* 27.5* 29.6*  MCV 89.4 88.8 85.7 88.9  PLT 243 225 244 0000000   Basic Metabolic Panel:  Recent Labs Lab 01/24/17 1743 01/25/17 0057 01/25/17 0344 01/26/17 0205 01/28/17 0411  NA 142  --  140 138 140  K 4.1  --  3.9 4.2 4.7  CL 90*  --  89* 89* 90*  CO2 42*  --  40* 39* 43*  GLUCOSE 121*  --  137* 167* 134*  BUN 8  --  10 17 27*  CREATININE 1.52*  --  1.55* 1.64* 1.64*  CALCIUM 9.0  --  8.6* 8.9 8.9  MG  --  2.1  --   --   --   PHOS  --  3.2  --   --   --    GFR: Estimated Creatinine Clearance: 38.2 mL/min (by C-G formula based on SCr of 1.64 mg/dL (H)). Liver Function Tests:  Recent Labs Lab 01/25/17 0344  AST 15  ALT 10*  ALKPHOS 53  BILITOT 0.8  PROT 6.3*  ALBUMIN 2.9*   No results for input(s): LIPASE, AMYLASE in the last 168 hours. No results for input(s): AMMONIA in the last 168 hours. Coagulation Profile: No results for input(s): INR, PROTIME in the last 168 hours. Cardiac Enzymes:  Recent Labs Lab 01/25/17 0057 01/25/17 0344  TROPONINI <0.03 <0.03   BNP (last 3 results) No results for input(s): PROBNP in the last 8760 hours. HbA1C: No results for input(s): HGBA1C in the last 72 hours. CBG: No results for input(s): GLUCAP in the last 168 hours. Lipid Profile: No results for input(s): CHOL, HDL, LDLCALC, TRIG, CHOLHDL, LDLDIRECT in the last 72 hours. Thyroid Function Tests: No results for input(s): TSH, T4TOTAL, FREET4, T3FREE, THYROIDAB in the last 72 hours. Anemia Panel: No results for input(s): VITAMINB12, FOLATE, FERRITIN, TIBC, IRON, RETICCTPCT in the last 72 hours. Urine analysis:    Component Value Date/Time   COLORURINE YELLOW 12/03/2016 1535   APPEARANCEUR HAZY (A) 12/03/2016 1535   LABSPEC 1.013 12/03/2016 1535   PHURINE 5.0 12/03/2016 1535   GLUCOSEU  NEGATIVE 12/03/2016 1535   HGBUR MODERATE (A) 12/03/2016 1535   BILIRUBINUR NEGATIVE 12/03/2016 1535   KETONESUR NEGATIVE 12/03/2016 1535   PROTEINUR 100 (A) 12/03/2016 1535   NITRITE NEGATIVE 12/03/2016 1535   LEUKOCYTESUR NEGATIVE 12/03/2016 1535   Sepsis Labs: Invalid input(s): PROCALCITONIN, LACTICIDVEN  Recent Results (from the past 240 hour(s))  MRSA PCR Screening     Status: None   Collection Time: 01/25/17  2:22 AM  Result Value Ref Range Status   MRSA by PCR NEGATIVE NEGATIVE Final    Comment:        The GeneXpert MRSA Assay (FDA approved for NASAL specimens only), is one component of  a comprehensive MRSA colonization surveillance program. It is not intended to diagnose MRSA infection nor to guide or monitor treatment for MRSA infections.       Radiology Studies: Dg Chest Port 1 View  Result Date: 01/27/2017 CLINICAL DATA:  Shortness of breath. EXAM: PORTABLE CHEST 1 VIEW COMPARISON:  01/24/2017. FINDINGS: Trachea is midline. Heart is enlarged, stable. Mild scarring or atelectasis of the lung bases. Lungs are otherwise clear. No pleural fluid. IMPRESSION: Mild scarring or atelectasis at the lung bases. Electronically Signed   By: Lorin Picket M.D.   On: 01/27/2017 11:16     Scheduled Meds: . amLODipine  2.5 mg Oral Daily  . aspirin  81 mg Oral Daily  . enoxaparin (LOVENOX) injection  40 mg Subcutaneous Q24H  . escitalopram  20 mg Oral QHS  . famotidine  20 mg Oral BID  . furosemide  60 mg Oral Daily  . guaiFENesin  600 mg Oral BID  . ipratropium-albuterol  3 mL Nebulization TID  . levofloxacin  750 mg Oral Q48H  . linaclotide  145 mcg Oral QAC breakfast  . metoprolol  50 mg Oral BID  . multivitamin with minerals  1 tablet Oral Daily  . pantoprazole  40 mg Oral Daily  . potassium chloride SA  20 mEq Oral Daily  . predniSONE  40 mg Oral Q breakfast  . sodium chloride flush  3 mL Intravenous Q12H   Continuous Infusions:   Marzetta Board, MD,  PhD [facility] Pager [phone]  If 7PM-7AM, please contact night-coverage [email or web address] Password [facility] 01/29/2017, 9:41 AM

## 2017-01-29 NOTE — Progress Notes (Signed)
Physical Therapy Treatment Patient Details Name: Amanda Horton MRN: [id number] DOB: October 17, 1946 Today's Date: 01/29/2017    History of Present Illness Pt is a 71 y/o female admitted from home secondary to SOB, confusion and COPD exacerbation. PMH including but not limited to CKD, HTN and COPD (dependent on 2L of O2 at all times).    PT Comments    Pt performed increased mobility but remains limited due to poor endurance and anxiety.  Pt will continue to benefit from skilled rehab to improve strength and promote functional independence.  Will plan for exercises in room and pacing with ambulation.     Follow Up Recommendations  Home health PT;Supervision/Assistance - 24 hour     Equipment Recommendations  None recommended by PT    Recommendations for Other Services       Precautions / Restrictions Precautions Precautions: None Restrictions Weight Bearing Restrictions: No    Mobility  Bed Mobility Overal bed mobility: Needs Assistance Bed Mobility: Supine to Sit     Supine to sit: Supervision     General bed mobility comments: increased time, use of bed rails, supervision for safety.  Sat edge of bed x 5 min to improve O2 sats in prep for ambulation in halls.    Transfers Overall transfer level: Needs assistance Equipment used: Rolling walker (2 wheeled) Transfers: Sit to/from Stand Sit to Stand: Min guard         General transfer comment: Cues for hand placement to and from seated surface.    Ambulation/Gait Ambulation/Gait assistance: Min guard Ambulation Distance (Feet): 40 Feet (Pt walked 40 feet and then began leaning elbows on RW.  Pt required a chair to be brought to patient and refused to ambulate back to room due to anxiety.  ) Assistive device: Rolling walker (2 wheeled) Gait Pattern/deviations: Step-through pattern;Decreased stride length;Trunk flexed Gait velocity: decreased Gait velocity interpretation: Below normal speed for age/gender General  Gait Details: mild instability with gait but no LOB or need for physical assistance, min guard for safety.  Poor endurance DOE 3/4.  Required 4L to maintain SPO2 90%-94%. Turned back down to 2L post gait training.     Stairs            Wheelchair Mobility    Modified Rankin (Stroke Patients Only)       Balance Overall balance assessment: Needs assistance Sitting-balance support: Feet supported Sitting balance-Leahy Scale: Good       Standing balance-Leahy Scale: Poor                      Cognition Arousal/Alertness: Awake/alert Behavior During Therapy: WFL for tasks assessed/performed Overall Cognitive Status: Within Functional Limits for tasks assessed                      Exercises      General Comments        Pertinent Vitals/Pain Pain Assessment: No/denies pain    Home Living                      Prior Function            PT Goals (current goals can now be found in the care plan section) Acute Rehab PT Goals Patient Stated Goal: walk further distances without feeling SOB Potential to Achieve Goals: Good Progress towards PT goals: Progressing toward goals    Frequency    Min 3X/week      PT Plan Current plan  remains appropriate    Co-evaluation             End of Session Equipment Utilized During Treatment: Gait belt;Oxygen (chair follow) Activity Tolerance: Patient tolerated treatment well Patient left: in chair;with call bell/phone within reach;with family/visitor present Nurse Communication: Mobility status PT Visit Diagnosis: Other abnormalities of gait and mobility (R26.89)     Time: [id number] PT Time Calculation (min) (ACUTE ONLY): 25 min  Charges:  $Gait Training: 8-22 mins $Therapeutic Activity: 8-22 mins                    G Codes:       Cristela Blue Feb 15, 2017, 2:51 PM Governor Rooks, PTA pager [phone]

## 2017-01-29 NOTE — Care Management Important Message (Signed)
Important Message  Patient Details  Name: Amanda Horton MRN: [id number] Date of Birth: October 19, 1946   Medicare Important Message Given:  Yes    Nathen May 01/29/2017, 1:48 PM

## 2017-01-30 ENCOUNTER — Inpatient Hospital Stay (HOSPITAL_COMMUNITY): Payer: Medicare HMO

## 2017-01-30 DIAGNOSIS — J9621 Acute and chronic respiratory failure with hypoxia: Secondary | ICD-10-CM

## 2017-01-30 DIAGNOSIS — J441 Chronic obstructive pulmonary disease with (acute) exacerbation: Secondary | ICD-10-CM

## 2017-01-30 DIAGNOSIS — J9622 Acute and chronic respiratory failure with hypercapnia: Principal | ICD-10-CM

## 2017-01-30 DIAGNOSIS — G9340 Encephalopathy, unspecified: Secondary | ICD-10-CM

## 2017-01-30 LAB — BASIC METABOLIC PANEL
ANION GAP: 7 (ref 5–15)
BUN: 29 mg/dL — AB (ref 6–20)
CO2: 43 mmol/L — AB (ref 22–32)
Calcium: 8.6 mg/dL — ABNORMAL LOW (ref 8.9–10.3)
Chloride: 92 mmol/L — ABNORMAL LOW (ref 101–111)
Creatinine, Ser: 1.63 mg/dL — ABNORMAL HIGH (ref 0.44–1.00)
GFR calc Af Amer: 36 mL/min — ABNORMAL LOW (ref >60)
GFR, EST NON AFRICAN AMERICAN: 31 mL/min — AB (ref >60)
GLUCOSE: 111 mg/dL — AB (ref 65–99)
POTASSIUM: 4.3 mmol/L (ref 3.5–5.1)
Sodium: 142 mmol/L (ref 135–145)

## 2017-01-30 LAB — POCT I-STAT 3, ART BLOOD GAS (G3+)
ACID-BASE EXCESS: 22 mmol/L — AB (ref 0.0–2.0)
BICARBONATE: 50 mmol/L — AB (ref 20.0–28.0)
O2 Saturation: 87 %
PO2 ART: 57 mmHg — AB (ref 83.0–108.0)
TCO2: >50 mmol/L (ref 0–100)
pCO2 arterial: 79.3 mmHg (ref 32.0–48.0)
pH, Arterial: 7.407 (ref 7.350–7.450)

## 2017-01-30 LAB — BLOOD GAS, ARTERIAL
Acid-Base Excess: 18.2 mmol/L — ABNORMAL HIGH (ref 0.0–2.0)
Bicarbonate: 45.7 mmol/L — ABNORMAL HIGH (ref 20.0–28.0)
Drawn by: 280981
O2 Content: 2 L/min
O2 Saturation: 96.4 %
PCO2 ART: 102 mmHg — AB (ref 32.0–48.0)
PH ART: 7.273 — AB (ref 7.350–7.450)
Patient temperature: 98.6
pO2, Arterial: 89.5 mmHg (ref 83.0–108.0)

## 2017-01-30 LAB — CBC
HCT: 31.8 % — ABNORMAL LOW (ref 36.0–46.0)
HEMOGLOBIN: 8.7 g/dL — AB (ref 12.0–15.0)
MCH: 24.4 pg — AB (ref 26.0–34.0)
MCHC: 27.4 g/dL — ABNORMAL LOW (ref 30.0–36.0)
MCV: 89.1 fL (ref 78.0–100.0)
Platelets: 267 10*3/uL (ref 150–400)
RBC: 3.57 MIL/uL — AB (ref 3.87–5.11)
RDW: 15.8 % — ABNORMAL HIGH (ref 11.5–15.5)
WBC: 8.4 10*3/uL (ref 4.0–10.5)

## 2017-01-30 LAB — GLUCOSE, CAPILLARY
Glucose-Capillary: 136 mg/dL — ABNORMAL HIGH (ref 65–99)
Glucose-Capillary: 173 mg/dL — ABNORMAL HIGH (ref 65–99)

## 2017-01-30 MED ORDER — NALOXONE HCL 0.4 MG/ML IJ SOLN
INTRAMUSCULAR | Status: AC
  Administered 2017-01-30: 0.4 mg
  Filled 2017-01-30: qty 1

## 2017-01-30 MED ORDER — IPRATROPIUM-ALBUTEROL 0.5-2.5 (3) MG/3ML IN SOLN
3.0000 mL | Freq: Four times a day (QID) | RESPIRATORY_TRACT | Status: DC
  Administered 2017-01-31 – 2017-02-01 (×8): 3 mL via RESPIRATORY_TRACT
  Filled 2017-01-30 (×8): qty 3

## 2017-01-30 MED ORDER — FUROSEMIDE 10 MG/ML IJ SOLN
60.0000 mg | Freq: Two times a day (BID) | INTRAMUSCULAR | Status: DC
  Administered 2017-01-30: 60 mg via INTRAVENOUS
  Filled 2017-01-30: qty 6

## 2017-01-30 MED ORDER — NALOXONE HCL 0.4 MG/ML IJ SOLN: 0.4000 mg | INTRAMUSCULAR | Status: DC | PRN

## 2017-01-30 MED ORDER — LEVOFLOXACIN IN D5W 500 MG/100ML IV SOLN
500.0000 mg | INTRAVENOUS | Status: DC
  Administered 2017-01-31: 500 mg via INTRAVENOUS
  Filled 2017-01-30: qty 100

## 2017-01-30 MED ORDER — HYDRALAZINE HCL 20 MG/ML IJ SOLN: 10.0000 mg | INTRAMUSCULAR | Status: DC | PRN

## 2017-01-30 MED ORDER — ORAL CARE MOUTH RINSE
15.0000 mL | Freq: Two times a day (BID) | OROMUCOSAL | Status: DC
  Administered 2017-01-30 – 2017-02-03 (×8): 15 mL via OROMUCOSAL

## 2017-01-30 MED ORDER — METHYLPREDNISOLONE SODIUM SUCC 40 MG IJ SOLR
40.0000 mg | Freq: Two times a day (BID) | INTRAMUSCULAR | Status: DC
  Administered 2017-01-30 – 2017-01-31 (×4): 40 mg via INTRAVENOUS
  Filled 2017-01-30 (×4): qty 1

## 2017-01-30 NOTE — Progress Notes (Signed)
Georgann Housekeeper for critical care & examined pt - given 0.4 Mg of Narcan IV -  Orders written to transfer to ICU. Daughter called on the phone coming from [facility] to see her mom.

## 2017-01-30 NOTE — Progress Notes (Addendum)
PROGRESS NOTE  Amanda Horton [id number] DOB: 1946/10/30 DOA: 01/24/2017 PCP: Nicoletta Dress, MD   LOS: 5 days   Brief Narrative: 71 year old female with chronic kidney disease stage III-IV, COPD, hypertension, hyperlipidemia, obstructive sleep apnea who was admitted on 2/22 with progressive shortness of breath, requiring BiPAP in the emergency room.  She was admitted to stepdown, respiratory status  has improved and she was transferred to floor on 2/24.  She improved on the floor, with plans for patient to be discharged on 2/57, however patient did not wear BiPAP on the night of 2/26 to 2/27, she was drowsy in the morning, and ABG revealed acute on chronic hypercarbic respiratory failure requiring re-transferr to stepdown and placement of BiPAP  Assessment & Plan: Principal Problem:   Acute respiratory failure with hypercapnia (HCC) Active Problems:   GERD (gastroesophageal reflux disease)   OSA (obstructive sleep apnea)   Essential hypertension   COPD exacerbation (HCC)   Anemia of chronic kidney failure   Anxiety and depression   Acute on chronic respiratory failure with hypoxia and hypercapnia (HCC) due to COPD exacerbation ([place]) -Initially admitted to stepdown requiring BiPAP, she was able to be on nasal cannula with BiPAP nightly only and was progressing well on the floor, however on 2/26 towards 2/27 nights, per nurse report patient has refused BiPAP overnight.  She complains of not feeling well this morning and she seems a bit drowsy, repeat ABG shows pH of 7.27, PCO2 of 102, requiring continuous BiPAP and will be transferred to stepdown.  Repeat chest x-ray. Patient educated this morning regarding BiPAP EVERY NIGHT but doubt she'll remember much. Will repeat ABG later today. May need Pulmonary consult if no improvement.  -Continue steroids.  Influenza was negative.  Continue Levaquin empirically. -goal o2 sats 88-92%  Addendum 6 PM -Patient by RN that patient is not doing  too well.  Reevaluated patient at bedside, repeated ABG which showed persistent hypercarbic respiratory failure with respiratory acidosis.  She is more drowsy than this morning, I am concerned about her ability to protect her airway, I have consulted pulmonary critical care, appreciate input.  Repeat chest x-ray today without significant changes.  GERD (gastroesophageal reflux disease) - Protonix   OSA (obstructive sleep apnea) -On BiPAP QHS, patient has been having difficulties getting this arranged at home and thus she has had progressive hypercarbia requiring hospitalization  Essential hypertension -Continue home medications, blood pressure stable this morning 143/53  Chronic kidney disease stage III-IV -Creatinine appears at baseline, repeat renal function today with stable creatinine  Anemia of chronic kidney failure -Monitor hematocrit and hemoglobin, no bleeding, stable  Anxiety and depression - continue home medications   DVT prophylaxis: Lovenox Code Status: Full Family Communication: no family at bedside Disposition Plan: TBD  Consultants:   None  Procedures:   BiPAP  Antimicrobials:  Levaquin 2/22 >>   Subjective: -She appears alert, however does not feel good overall, cannot elaborate more  Objective: Vitals:   01/30/17 0909 01/30/17 1006 01/30/17 1006 01/30/17 1022  BP:  (!) 143/53 (!) 143/53   Pulse:   76 80  Resp:   20 (!) 27  Temp:      TempSrc:      SpO2: 97%  98% 95%  Weight:      Height:        Intake/Output Summary (Last 24 hours) at 01/30/17 1041 Last data filed at 01/30/17 0847  Gross per 24 hour  Intake  60 ml  Output             1150 ml  Net            -1090 ml   Filed Weights   01/25/17 0203 01/27/17 1713  Weight: 112.5 kg (248 lb) 117 kg (257 lb 15 oz)    Examination: Constitutional: NAD, BiPAP on Vitals:   01/30/17 0909 01/30/17 1006 01/30/17 1006 01/30/17 1022  BP:  (!) 143/53 (!) 143/53   Pulse:   76 80   Resp:   20 (!) 27  Temp:      TempSrc:      SpO2: 97%  98% 95%  Weight:      Height:       Eyes: PERRL, lids and conjunctivae normal Respiratory: clear to auscultation bilaterally, no wheezing, no crackles. Normal respiratory effort.  Cardiovascular: Regular rate and rhythm, no murmurs / rubs / gallops. No LE edema. 2+ pedal pulses.  Abdomen: no tenderness. Bowel sounds positive.  Musculoskeletal: no clubbing / cyanosis.  Skin: no rashes, lesions, ulcers. No induration.  Chronic venous stasis changes bilateral lower extremities Neurologic: non focal   Psychiatric: Normal judgment and insight. Alert and oriented x 3. Normal mood.    Data Reviewed: I have personally reviewed following labs and imaging studies  CBC:  Recent Labs Lab 01/24/17 1743 01/25/17 0344 01/26/17 0205 01/28/17 0411 01/30/17 0522  WBC 6.8 8.0 8.7 7.9 8.4  HGB 8.9* 8.3* 8.0* 8.4* 8.7*  HCT 32.0* 29.4* 27.5* 29.6* 31.8*  MCV 89.4 88.8 85.7 88.9 89.1  PLT 243 225 244 254 [id number]   Basic Metabolic Panel:  Recent Labs Lab 01/24/17 1743 01/25/17 0057 01/25/17 0344 01/26/17 0205 01/28/17 0411 01/30/17 0522  NA 142  --  140 138 140 142  K 4.1  --  3.9 4.2 4.7 4.3  CL 90*  --  89* 89* 90* 92*  CO2 42*  --  40* 39* 43* 43*  GLUCOSE 121*  --  137* 167* 134* 111*  BUN 8  --  10 17 27* 29*  CREATININE 1.52*  --  1.55* 1.64* 1.64* 1.63*  CALCIUM 9.0  --  8.6* 8.9 8.9 8.6*  MG  --  2.1  --   --   --   --   PHOS  --  3.2  --   --   --   --    GFR: Estimated Creatinine Clearance: 38.4 mL/min (by C-G formula based on SCr of 1.63 mg/dL (H)). Liver Function Tests:  Recent Labs Lab 01/25/17 0344  AST 15  ALT 10*  ALKPHOS 53  BILITOT 0.8  PROT 6.3*  ALBUMIN 2.9*   No results for input(s): LIPASE, AMYLASE in the last 168 hours. No results for input(s): AMMONIA in the last 168 hours. Coagulation Profile: No results for input(s): INR, PROTIME in the last 168 hours. Cardiac Enzymes:  Recent Labs Lab  01/25/17 0057 01/25/17 0344  TROPONINI <0.03 <0.03   BNP (last 3 results) No results for input(s): PROBNP in the last 8760 hours. HbA1C: No results for input(s): HGBA1C in the last 72 hours. CBG: No results for input(s): GLUCAP in the last 168 hours. Lipid Profile: No results for input(s): CHOL, HDL, LDLCALC, TRIG, CHOLHDL, LDLDIRECT in the last 72 hours. Thyroid Function Tests: No results for input(s): TSH, T4TOTAL, FREET4, T3FREE, THYROIDAB in the last 72 hours. Anemia Panel: No results for input(s): VITAMINB12, FOLATE, FERRITIN, TIBC, IRON, RETICCTPCT in the last 72 hours. Urine analysis:  Component Value Date/Time   COLORURINE YELLOW 12/03/2016 1535   APPEARANCEUR HAZY (A) 12/03/2016 1535   LABSPEC 1.013 12/03/2016 1535   PHURINE 5.0 12/03/2016 1535   GLUCOSEU NEGATIVE 12/03/2016 1535   HGBUR MODERATE (A) 12/03/2016 1535   BILIRUBINUR NEGATIVE 12/03/2016 1535   KETONESUR NEGATIVE 12/03/2016 1535   PROTEINUR 100 (A) 12/03/2016 1535   NITRITE NEGATIVE 12/03/2016 1535   LEUKOCYTESUR NEGATIVE 12/03/2016 1535   Sepsis Labs: Invalid input(s): PROCALCITONIN, LACTICIDVEN  Recent Results (from the past 240 hour(s))  MRSA PCR Screening     Status: None   Collection Time: 01/25/17  2:22 AM  Result Value Ref Range Status   MRSA by PCR NEGATIVE NEGATIVE Final    Comment:        The GeneXpert MRSA Assay (FDA approved for NASAL specimens only), is one component of a comprehensive MRSA colonization surveillance program. It is not intended to diagnose MRSA infection nor to guide or monitor treatment for MRSA infections.       Radiology Studies: No results found.   Scheduled Meds: . amLODipine  2.5 mg Oral Daily  . aspirin  81 mg Oral Daily  . enoxaparin (LOVENOX) injection  40 mg Subcutaneous Q24H  . escitalopram  20 mg Oral QHS  . famotidine  20 mg Oral BID  . furosemide  60 mg Oral Daily  . guaiFENesin  600 mg Oral BID  . ipratropium-albuterol  3 mL  Nebulization TID  . levofloxacin  750 mg Oral Q48H  . linaclotide  145 mcg Oral QAC breakfast  . metoprolol  50 mg Oral BID  . multivitamin with minerals  1 tablet Oral Daily  . pantoprazole  40 mg Oral Daily  . potassium chloride SA  20 mEq Oral Daily  . predniSONE  40 mg Oral Q breakfast  . sodium chloride flush  3 mL Intravenous Q12H   Continuous Infusions:   Marzetta Board, MD, PhD [facility] Pager [phone]  If 7PM-7AM, please contact night-coverage [email or web address] Password [facility] 01/30/2017, 10:41 AM

## 2017-01-30 NOTE — Progress Notes (Signed)
Reported to day nurse that patient refused her CPAP last night. Pt in resp distress this AM. Respiratory came in to give breathing treatment and routine gas and stated pt did not look good. Put her on BIPAP paged Dr. Cruzita Lederer and Rapid response. Pt responding well to BIPAP. Orders have been placed for pt to be moved to step down bed.

## 2017-01-30 NOTE — Procedures (Signed)
Pt transported on Bipap to 4E16 at this time, no complications.

## 2017-01-30 NOTE — Progress Notes (Signed)
Alerted by RT Lauren regarding ABG result on patient. MD aware.   Arrived to room after patient on Bipap - warm and dry - arouses with ease - follows commands and is oriented but still very sleepy.  Tol Bipap - per report refused pm CPAP.  No resp distress - VSS - for transfer to SDU - no beds available.  RT will Bipap patient until bed available.  To call as needed.

## 2017-01-30 NOTE — Progress Notes (Signed)
Call to Dev[facility] - gave report oon pt to [facility] - call made to Barbaraann Share aware his mom is going to ICU and bed #

## 2017-01-30 NOTE — Procedures (Signed)
Arrived to pt's room to give scheduled neb tx.  Pt lethargic and has increased WOB, ABG done, critical results called back to MD.  Pt placed on Bipap per MD order, tolerating well. Order for pt to be transferred to [facility] unit, will stay at bedside until pt is moved.

## 2017-01-30 NOTE — Progress Notes (Addendum)
Pt yelling in mask trying to take Bipap mask off ST now 126 reminding pt to relax helping her to stay in bed and keep Bipap on Just says I want water - swabbed mouth with ice water Sats in the 70's when pulling off her mask

## 2017-01-30 NOTE — Progress Notes (Signed)
RT transported pt on NIV with no complications

## 2017-01-30 NOTE — Progress Notes (Signed)
Pt struggling to take off Bipap mask when pt takes off mask her sats drop to low 80's given 0.5 mg Ativan IV to help her relax Son at bedside assisting with trying to keep his mom from taking off her Biap

## 2017-01-30 NOTE — Progress Notes (Signed)
Dr Cruzita Lederer called with results of ABG's DR came and examined pt - call made critical care to see pt.

## 2017-01-30 NOTE — Consult Note (Signed)
PULMONARY / CRITICAL CARE MEDICINE   Name: Amanda Horton MRN: [id number] DOB: 27-Apr-1946    ADMISSION DATE:  01/24/2017 CONSULTATION DATE:  01/31/2017  REFERRING MD:  Dr. Cruzita Lederer  CHIEF COMPLAINT:  Hypercarbia  HISTORY OF PRESENT ILLNESS:  71 year old female with complicated PMH as below, which is significant for OSA on BiPAP (poor compliance), COPD on home O2, CKD, chronic systolic/diastolic CHF, HTN, and borderline DM. She was recently admitted in early January for acute on chronic respiratory failure in the setting of COPD exacerbation and pneumonia. She was treated with antibiotics, steroids, and nebulized bronchodilators. Also benefited from diuresis, which was limited due to renal function.She was discharged to SNF for further recovery and was then discharged to home. Family states she has continued to be confused since that time and has been noncompliant with her BiPAP. She presented to [facility] ED 2/21 with complaints of SOB and altered mental status. She was found to be hypercarbic and was started on BiPAP which caused her pH and mental status to slowly improve. She was admitted to the hosptialist team with a diagnosis of COPD exacerbation and was treated with nebulized bronchodilators, IV steroids, and IV antibiotics. She slowly improved with these therapies and the resumption of her home nocturnal BiPAP. 2/26 PM she refused BiPAP and awoke 2/27 encephalopathic. ABG was checked at that time and demonstrated hypercarbic failure. She was transferred to stepdown and started on BiPAP. Due to agitation BiPAP was minimally effective by blood gas several hours later. She became more lethargic. PCCM asked to see.    PAST MEDICAL HISTORY :  She  has a past medical history of Acute hyperkalemia; Acute hypokalemia; Acute on chronic renal failure ([place]); Allergic rhinitis; Anemia due to chronic kidney disease; APC (atrial premature contractions); Arthritis of lumbar spine ([place]); Benign essential  hypertension; Borderline diabetes; Chronic insomnia; Chronic venous insufficiency; COPD (chronic obstructive pulmonary disease) (HCC); GERD (gastroesophageal reflux disease); Hyperlipemia; Hypoxia; Lumbar disc narrowing; On home oxygen therapy; OSA (obstructive sleep apnea); Osteoporosis; Respiratory failure requiring intubation ([place]); Spinal stenosis of lumbar region without neurogenic claudication; Varicose veins with pain; Vitamin B12 deficiency; and Vitamin D deficiency.  PAST SURGICAL HISTORY: She  has a past surgical history that includes Partial hysterectomy; I&D extremity (Right, 08/30/2016); Application if wound vac (Right, 08/30/2016); Application of a-cell of extremity (Right, 08/30/2016); Incision and drainage of wound (Right, 09/20/2016); Application of a-cell of extremity (Right, [id number]); and Application if wound vac (Right, 09/20/2016).  Allergies  Allergen Reactions  . Sulfa Antibiotics Nausea And Vomiting and Other (See Comments)  . Tape Other (See Comments)    SKIN IS VERY THIN AND WILL BRUISE & TEAR EASILY!!    No current facility-administered medications on file prior to encounter.    Current Outpatient Prescriptions on File Prior to Encounter  Medication Sig  . albuterol (PROVENTIL HFA;VENTOLIN HFA) 108 (90 BASE) MCG/ACT inhaler Inhale 2 puffs into the lungs every 6 (six) hours as needed for wheezing or shortness of breath.  Marland Kitchen alendronate (FOSAMAX) 70 MG tablet Take 70 mg by mouth every Saturday. Take with a full glass of water on an empty stomach.   Marland Kitchen amLODipine (NORVASC) 2.5 MG tablet Take 1 tablet (2.5 mg total) by mouth daily.  Marland Kitchen aspirin 81 MG tablet Take 81 mg by mouth daily.  Marland Kitchen escitalopram (LEXAPRO) 20 MG tablet Take 20 mg by mouth daily.   Marland Kitchen ipratropium-albuterol (DUONEB) 0.5-2.5 (3) MG/3ML SOLN Take 3 mLs by nebulization 2 (two) times daily. (Patient taking differently:  Take 3 mLs by nebulization every 4 (four) hours as needed (for wheezing or shortness of breath).  )  . linaclotide (LINZESS) 145 MCG CAPS capsule Take 145 mcg by mouth daily.   . metoprolol (LOPRESSOR) 50 MG tablet Take 1 tablet (50 mg total) by mouth 2 (two) times daily.  . mometasone-formoterol (DULERA) 200-5 MCG/ACT AERO Inhale 2 puffs into the lungs 2 (two) times daily.  Marland Kitchen omeprazole (PRILOSEC) 40 MG capsule Take 40 mg by mouth daily.   . ondansetron (ZOFRAN) 4 MG tablet Take 1 tablet (4 mg total) by mouth every 6 (six) hours as needed for nausea.  Marland Kitchen oxyCODONE-acetaminophen (PERCOCET/ROXICET) 5-325 MG tablet Take 1 tablet by mouth every 8 (eight) hours as needed for moderate pain.  . ranitidine (ZANTAC) 150 MG tablet Take 1 tablet by mouth 2 (two) times daily.  . Tiotropium Bromide Monohydrate 2.5 MCG/ACT AERS Inhale 2 puffs into the lungs daily.  . Vitamin D, Ergocalciferol, (DRISDOL) 50000 UNITS CAPS capsule Take 50,000 Units by mouth every Saturday.   Marland Kitchen guaiFENesin (MUCINEX) 600 MG 12 hr tablet Take 1 tablet (600 mg total) by mouth 2 (two) times daily. (Patient not taking: Reported on 01/25/2017)  . LORazepam (ATIVAN) 1 MG tablet Take 1 tablet (1 mg total) by mouth every 8 (eight) hours as needed for anxiety.  . potassium chloride SA (K-DUR,KLOR-CON) 20 MEQ tablet Take 1 tablet (20 mEq total) by mouth daily. (Patient not taking: Reported on 01/25/2017)  . temazepam (RESTORIL) 15 MG capsule Take 1 capsule (15 mg total) by mouth at bedtime as needed for sleep. (Patient not taking: Reported on 01/25/2017)    FAMILY HISTORY:  Her indicated that the status of her mother is unknown. She indicated that the status of her father is unknown.    SOCIAL HISTORY: She  reports that she quit smoking about 4 years ago. Her smoking use included Cigarettes. She has a 55.00 pack-year smoking history. She has never used smokeless tobacco. She reports that she does not drink alcohol or use drugs.  REVIEW OF SYSTEMS:   Unable as patient is encephalopathic  SUBJECTIVE:    VITAL SIGNS: BP 123/61    Pulse 78   Temp 98.2 F (36.8 C) (Axillary)   Resp 11   Ht 5\' 2"  (1.575 m)   Wt 114.8 kg (253 lb 1.4 oz)   SpO2 97%   BMI 46.29 kg/m   HEMODYNAMICS:    VENTILATOR SETTINGS: Vent Mode: BIPAP FiO2 (%):  [40 %] 40 % Set Rate:  [14 bmp-20 bmp] 14 bmp Pressure Support:  [7 cmH20] 7 cmH20  INTAKE / OUTPUT: I/O last 3 completed shifts: In: 180 [P.O.:180] Out: 1450 [Urine:1450]  PHYSICAL EXAMINATION: General:  Morbidly obese female on BiPAP Neuro:  Somnolent, RASS -1 HEENT:  [place]/AT, PERRL, no appreciable JVD due to large neck girth.  Cardiovascular:  RRR, no MRG. +1 BLE edema.  Lungs:  Distant breath sounds, sound mostly clear. Abdomen:  Protuberant, soft, non-distended. Non-tender Musculoskeletal:  No acute deformity or ROM limitation Skin:  Grossly intact  LABS:  BMET  Recent Labs Lab 01/26/17 0205 01/28/17 0411 01/30/17 0522  NA 138 140 142  K 4.2 4.7 4.3  CL 89* 90* 92*  CO2 39* 43* 43*  BUN 17 27* 29*  CREATININE 1.64* 1.64* 1.63*  GLUCOSE 167* 134* 111*    Electrolytes  Recent Labs Lab 01/25/17 0057  01/26/17 0205 01/28/17 0411 01/30/17 0522  CALCIUM  --   < > 8.9 8.9 8.6*  MG 2.1  --   --   --   --   PHOS 3.2  --   --   --   --   < > = values in this interval not displayed.  CBC  Recent Labs Lab 01/26/17 0205 01/28/17 0411 01/30/17 0522  WBC 8.7 7.9 8.4  HGB 8.0* 8.4* 8.7*  HCT 27.5* 29.6* 31.8*  PLT 244 254 267    Coag's No results for input(s): APTT, INR in the last 168 hours.  Sepsis Markers No results for input(s): LATICACIDVEN, PROCALCITON, O2SATVEN in the last 168 hours.  ABG  Recent Labs Lab 01/25/17 1050 01/30/17 0920 01/30/17 1635  PHART 7.440 7.273* 7.279*  PCO2ART 66.2* 102* 94.8*  PO2ART 59.0* 89.5 60.1*    Liver Enzymes  Recent Labs Lab 01/25/17 0344  AST 15  ALT 10*  ALKPHOS 53  BILITOT 0.8  ALBUMIN 2.9*    Cardiac Enzymes  Recent Labs Lab 01/25/17 0057 01/25/17 0344  TROPONINI <0.03 <0.03     Glucose No results for input(s): GLUCAP in the last 168 hours.  Imaging Dg Chest Port 1 View  Result Date: 01/30/2017 CLINICAL DATA:  Difficulty breathing EXAM: PORTABLE CHEST 1 VIEW COMPARISON:  01/27/2017 FINDINGS: Cardiac shadow remains enlarged. The lungs are again well aerated without focal infiltrate or sizable effusion. Stable scarring in the bases is noted. No bony abnormality is seen. IMPRESSION: No acute abnormality seen. Electronically Signed   By: Inez Catalina M.D.   On: 01/30/2017 11:30     STUDIES:    CULTURES:   ANTIBIOTICS: Levaquin 2/21 >  SIGNIFICANT EVENTS: 2/21 admit 2/27 tx to ICU for lethargy and hypercarbia on BiPAP  LINES/TUBES:   DISCUSSION:   ASSESSMENT / PLAN:  PULMONARY A: Acute hypercarbic respiratory failure secondary to decompensated OSA/OHS COPD with acute exacerbation (resolved)  P:   BiPAP Follow ABG Continue nebs, steroids Limit sedating medication Family endorses intubation if needed, low threshold  CARDIOVASCULAR A:  Chronic diastolic CHF H/o HTN  P:  Telemetry monitoring  Resume home amlodipine, lasix, metoprolol once taking PO PRN hydralazine IV to keep SBP < 182mmHg  RENAL A:   CKD III  P:   Follow BMP and UOP  GASTROINTESTINAL A:   GERD  P:   NPO while on BiPAP Continue pepcid  HEMATOLOGIC A:   Anemia of chronic illness  P:  Follow CBC Subcutaneous lovenox, renal dose  INFECTIOUS A:   COPD P:   Levaquin  ENDOCRINE A:   Borderline DM  P:   CBG monitoring and SSI  NEUROLOGIC A:   Acute metabolic encephalopathy secondary to hypercarbia  P:   BiPAP to correct ABG, hopefully improves Discontinuing PRN oxy, ativan, restoril  FAMILY  - Updates: family updated. Endorsing full code (son and daughter). At time of family conversation, patient's mental status improving. Hopeful we can avoid intubation. Made it clear to them she will be difficult to liberate from vent. Stressed  importance of BiPAP compliance.   - Inter-disciplinary family meet or Palliative Care meeting due by: 2/27   Georgann Housekeeper, AGACNP-BC [place] [facility]/Critical Care Pager [phone] or [phone]  01/30/2017 7:12 PM

## 2017-01-30 NOTE — Progress Notes (Signed)
Pt received from 5W37 per bed by RN and RT Pt on  Bipap at .40 FI02  CHG done Flushed IV NSL RT upper FA Pt able to follow commands - but trying to take Bipap mask off

## 2017-01-30 NOTE — Procedures (Signed)
ABG results called back to MD, Bipap ordered.

## 2017-01-30 NOTE — Procedures (Signed)
Pt awake and alert requesting for Bipap to come off, pt taken off Bipap at this time placed on 2L [place] tolerating well, no distress, son at bedside, MD called to make aware, MD would like pt to stay on Bipap for at least 6hrs, pt placed back on bipap at this time and is being transferred to 4E.

## 2017-01-31 DIAGNOSIS — Z515 Encounter for palliative care: Secondary | ICD-10-CM

## 2017-01-31 DIAGNOSIS — N183 Chronic kidney disease, stage 3 (moderate): Secondary | ICD-10-CM

## 2017-01-31 DIAGNOSIS — K219 Gastro-esophageal reflux disease without esophagitis: Secondary | ICD-10-CM

## 2017-01-31 DIAGNOSIS — I10 Essential (primary) hypertension: Secondary | ICD-10-CM

## 2017-01-31 DIAGNOSIS — J9602 Acute respiratory failure with hypercapnia: Secondary | ICD-10-CM

## 2017-01-31 DIAGNOSIS — J810 Acute pulmonary edema: Secondary | ICD-10-CM

## 2017-01-31 LAB — BASIC METABOLIC PANEL
ANION GAP: 3 — AB (ref 5–15)
BUN: 27 mg/dL — ABNORMAL HIGH (ref 6–20)
CHLORIDE: 91 mmol/L — AB (ref 101–111)
CO2: 48 mmol/L — ABNORMAL HIGH (ref 22–32)
Calcium: 8.5 mg/dL — ABNORMAL LOW (ref 8.9–10.3)
Creatinine, Ser: 1.66 mg/dL — ABNORMAL HIGH (ref 0.44–1.00)
GFR calc non Af Amer: 30 mL/min — ABNORMAL LOW (ref >60)
GFR, EST AFRICAN AMERICAN: 35 mL/min — AB (ref >60)
Glucose, Bld: 145 mg/dL — ABNORMAL HIGH (ref 65–99)
POTASSIUM: 4.6 mmol/L (ref 3.5–5.1)
SODIUM: 142 mmol/L (ref 135–145)

## 2017-01-31 LAB — BLOOD GAS, ARTERIAL
ACID-BASE EXCESS: 15.7 mmol/L — AB (ref 0.0–2.0)
Acid-Base Excess: 20.6 mmol/L — ABNORMAL HIGH (ref 0.0–2.0)
BICARBONATE: 43 mmol/L — AB (ref 20.0–28.0)
BICARBONATE: 47.3 mmol/L — AB (ref 20.0–28.0)
DELIVERY SYSTEMS: POSITIVE
Drawn by: 277551
Drawn by: 419771
EXPIRATORY PAP: 5
FIO2: 40
FIO2: 40
Inspiratory PAP: 20
LHR: 22 {breaths}/min
O2 SAT: 88.7 %
O2 SAT: 97.5 %
PATIENT TEMPERATURE: 98.6
PATIENT TEMPERATURE: 98.6
PCO2 ART: 87.2 mmHg — AB (ref 32.0–48.0)
PEEP/CPAP: 6 cmH2O
PH ART: 7.279 — AB (ref 7.350–7.450)
PH ART: 7.353 (ref 7.350–7.450)
PO2 ART: 60.1 mmHg — AB (ref 83.0–108.0)
Pressure control: 16 cmH2O
RATE: 20 resp/min
pCO2 arterial: 94.8 mmHg (ref 32.0–48.0)
pO2, Arterial: 96.7 mmHg (ref 83.0–108.0)

## 2017-01-31 LAB — GLUCOSE, CAPILLARY
GLUCOSE-CAPILLARY: 166 mg/dL — AB (ref 65–99)
GLUCOSE-CAPILLARY: 216 mg/dL — AB (ref 65–99)
GLUCOSE-CAPILLARY: 203 mg/dL — AB (ref 65–99)
Glucose-Capillary: 133 mg/dL — ABNORMAL HIGH (ref 65–99)
Glucose-Capillary: 158 mg/dL — ABNORMAL HIGH (ref 65–99)

## 2017-01-31 LAB — CBC
HCT: 32 % — ABNORMAL LOW (ref 36.0–46.0)
HEMOGLOBIN: 8.9 g/dL — AB (ref 12.0–15.0)
MCH: 24.5 pg — ABNORMAL LOW (ref 26.0–34.0)
MCHC: 27.8 g/dL — ABNORMAL LOW (ref 30.0–36.0)
MCV: 88.2 fL (ref 78.0–100.0)
Platelets: 233 10*3/uL (ref 150–400)
RBC: 3.63 MIL/uL — AB (ref 3.87–5.11)
RDW: 15.7 % — ABNORMAL HIGH (ref 11.5–15.5)
WBC: 7.4 10*3/uL (ref 4.0–10.5)

## 2017-01-31 MED ORDER — FUROSEMIDE 40 MG PO TABS
40.0000 mg | ORAL_TABLET | Freq: Two times a day (BID) | ORAL | Status: DC
  Administered 2017-01-31 – 2017-02-03 (×7): 40 mg via ORAL
  Filled 2017-01-31 (×7): qty 1

## 2017-01-31 MED ORDER — ESCITALOPRAM OXALATE 20 MG PO TABS
20.0000 mg | ORAL_TABLET | Freq: Every day | ORAL | Status: DC
  Administered 2017-01-31 – 2017-02-03 (×4): 20 mg via ORAL
  Filled 2017-01-31: qty 1
  Filled 2017-01-31: qty 2
  Filled 2017-01-31: qty 1
  Filled 2017-01-31: qty 2

## 2017-01-31 MED ORDER — IPRATROPIUM-ALBUTEROL 0.5-2.5 (3) MG/3ML IN SOLN: 3.0000 mL | RESPIRATORY_TRACT | Status: DC | PRN

## 2017-01-31 NOTE — Progress Notes (Signed)
PULMONARY / CRITICAL CARE MEDICINE   Name: Amanda Horton MRN: [id number] DOB: 11-21-1946    ADMISSION DATE:  01/24/2017 CONSULTATION DATE:  01/31/2017  REFERRING MD:  Dr. Cruzita Lederer  CHIEF COMPLAINT:  Hypercarbia  HISTORY OF PRESENT ILLNESS:  71 year old female with complicated PMH as below, which is significant for OSA on BiPAP (poor compliance), COPD on home O2, CKD, chronic systolic/diastolic CHF, HTN, and borderline DM. She was recently admitted in early January for acute on chronic respiratory failure in the setting of COPD exacerbation and pneumonia. She was treated with antibiotics, steroids, and nebulized bronchodilators. Also benefited from diuresis, which was limited due to renal function.She was discharged to SNF for further recovery and was then discharged to home. Family states she has continued to be confused since that time and has been noncompliant with her BiPAP. She presented to [facility] ED 2/21 with complaints of SOB and altered mental status. She was found to be hypercarbic and was started on BiPAP which caused her pH and mental status to slowly improve. She was admitted to the hosptialist team with a diagnosis of COPD exacerbation and was treated with nebulized bronchodilators, IV steroids, and IV antibiotics. She slowly improved with these therapies and the resumption of her home nocturnal BiPAP. 2/26 PM she refused BiPAP and awoke 2/27 encephalopathic. ABG was checked at that time and demonstrated hypercarbic failure. She was transferred to stepdown and started on BiPAP. Due to agitation BiPAP was minimally effective by blood gas several hours later. She became more lethargic. PCCM asked to see.   SUBJECTIVE:  No events overnight, feels much better  VITAL SIGNS: BP (!) 145/71   Pulse 71   Temp 97 F (36.1 C) (Axillary)   Resp (!) 26   Ht 5\' 2"  (1.575 m)   Wt 114.8 kg (253 lb 1.4 oz)   SpO2 97%   BMI 46.29 kg/m   HEMODYNAMICS:    VENTILATOR SETTINGS: Vent Mode:  BIPAP;PCV FiO2 (%):  [30 %-40 %] 30 % Set Rate:  [14 bmp-24 bmp] 24 bmp PEEP:  [6 cmH20] 6 cmH20 Pressure Support:  [7 cmH20] 7 cmH20  INTAKE / OUTPUT: I/O last 3 completed shifts: In: 92 [P.O.:60] Out: 2310 [Urine:2310]  PHYSICAL EXAMINATION: General:  Morbidly obese female on BiPAP, alert and interactive, following commands Neuro:  Awake and interactive, moving all ext to command HEENT:  [place]/AT, PERRL, no appreciable JVD due to large neck girth (unable to evaluate) Cardiovascular:  RRR, no MRG. Negative pitting edema Lungs:  Distant breath sounds, sound mostly clear anteriorly only. Abdomen:  Protuberant, soft, non-distended. Non-tender Musculoskeletal:  No acute deformity or ROM limitation Skin:  Grossly intact  LABS:  BMET  Recent Labs Lab 01/28/17 0411 01/30/17 0522 01/31/17 0252  NA 140 142 142  K 4.7 4.3 4.6  CL 90* 92* 91*  CO2 43* 43* 48*  BUN 27* 29* 27*  CREATININE 1.64* 1.63* 1.66*  GLUCOSE 134* 111* 145*   Electrolytes  Recent Labs Lab 01/25/17 0057  01/28/17 0411 01/30/17 0522 01/31/17 0252  CALCIUM  --   < > 8.9 8.6* 8.5*  MG 2.1  --   --   --   --   PHOS 3.2  --   --   --   --   < > = values in this interval not displayed.  CBC  Recent Labs Lab 01/28/17 0411 01/30/17 0522 01/31/17 0252  WBC 7.9 8.4 7.4  HGB 8.4* 8.7* 8.9*  HCT 29.6* 31.8* 32.0*  PLT 254 267 233   Coag's No results for input(s): APTT, INR in the last 168 hours.  Sepsis Markers No results for input(s): LATICACIDVEN, PROCALCITON, O2SATVEN in the last 168 hours.  ABG  Recent Labs Lab 01/30/17 1635 01/30/17 2001 01/31/17 0340  PHART 7.279* 7.407 7.353  PCO2ART 94.8* 79.3* 87.2*  PO2ART 60.1* 57.0* 96.7   Liver Enzymes  Recent Labs Lab 01/25/17 0344  AST 15  ALT 10*  ALKPHOS 53  BILITOT 0.8  ALBUMIN 2.9*   Cardiac Enzymes  Recent Labs Lab 01/25/17 0057 01/25/17 0344  TROPONINI <0.03 <0.03   Glucose  Recent Labs Lab 01/30/17 1959  01/30/17 2345 01/31/17 0307 01/31/17 0748  GLUCAP 173* 136* 133* 158*   Imaging Dg Chest Port 1 View  Result Date: 01/30/2017 CLINICAL DATA:  Difficulty breathing EXAM: PORTABLE CHEST 1 VIEW COMPARISON:  01/27/2017 FINDINGS: Cardiac shadow remains enlarged. The lungs are again well aerated without focal infiltrate or sizable effusion. Stable scarring in the bases is noted. No bony abnormality is seen. IMPRESSION: No acute abnormality seen. Electronically Signed   By: Inez Catalina M.D.   On: 01/30/2017 11:30   STUDIES:  None  CULTURES: None  ANTIBIOTICS: Levaquin 2/21>>>2/28  SIGNIFICANT EVENTS: 2/21 Admit 2/27 Tx to ICU for lethargy and hypercarbia on BiPAP  LINES/TUBES: BiPAP  DISCUSSION: 71 year old female with advance COPD who was fluid overloaded and developed acute on chronic hypercarbic respiratory failure.  Anticipate patient lives with a CO2 of 80 and when increased to 95 she developed altered mental status.  Patient was placed on BiPAP and diuresed with good response.  This AM, patient is much improved, knows it is 2018, Trump is president, knows she is in [facility] [place] and that she has 8 children.  We discussed code status, she informed me that she would not want to be placed on life support and would not want to have CPR/cardioversion.  ABG noted and it is much improved.  Will d/c BiPAP, transfer patient to SDU and make DNR.  Change lasix to PO.  D/C levaquin since PCT is 0.24 without fever or WBC.  This was all discussed with Dr. Cathlean Sauer who will assume care of the patient.  I also spoke with the patient's daughter over the phone and informed her of changes in code status and she is agreeable with plan.  PCCM will sign off, please call back if needed.  Palliative care consulted and will follow.  The patient is critically ill with multiple organ systems failure and requires high complexity decision making for assessment and support, frequent evaluation and titration of therapies,  application of advanced monitoring technologies and extensive interpretation of multiple databases.   Critical Care Time devoted to patient care services described in this note is  35  Minutes. This time reflects time of care of this signee Dr Jennet Maduro. This critical care time does not reflect procedure time, or teaching time or supervisory time of PA/NP/Med student/Med Resident etc but could involve care discussion time.  Rush Farmer, M.D. [facility] Pulmonary/Critical Care Medicine. Pager: [phone]. After hours pager: [phone].  01/31/2017 8:55 AM

## 2017-01-31 NOTE — Progress Notes (Signed)
PROGRESS NOTE    Amanda Horton  [id number] DOB: Aug 07, 1946 DOA: 01/24/2017 PCP: Nicoletta Dress, MD    Brief Narrative:  71 yo female with COPD, presents with the chief complain of worsening dyspnea, confusion, wheezing and increased mucus production. On the initial evaluation found obtunded and somnolent, positive rhonchi. Admitted to the step down unit on non invasive mechanical ventilation, due to hypercapnic respiratory failure. Not able to completely liberated from bipap, transferred to the medical ward but had worsening symptoms that required transfer back to step down unit.    Assessment & Plan:   Principal Problem:   Acute respiratory failure with hypercapnia (HCC) Active Problems:   GERD (gastroesophageal reflux disease)   OSA (obstructive sleep apnea)   Essential hypertension   COPD exacerbation (HCC)   Anemia of chronic kidney failure   Anxiety and depression   1. Acute on chronic hypoxic and hypercapnic respiratory failure. Patient more awake and alert, but worsening hypercapnia, pH down to 7,35. Bipap on 16/6 making tidal volumes of 400's. Will follow on blood gas this pm, if improvement will allow to have bipap intermittently. Will continue systemic steroids, bronchodilator therapy. Discussed with pulmonary and will hold on levofloxacin. Chest film personally reviewed noted no infiltrates but positive hyperinflation. Will check beside swallow evaluation before resuming diet.   2. COPD. Will continue oxymetry monitoring, systemic steroids and bronchodilator therapy. Patient with bipap at home.   3. OSA. Patient on bipap at home, will continue non invasive mechanical ventilation at night.   4. HTN. Systolic blood pressure 123456 to 130. Will continue amlodipine, metoprolol.   5. 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 Renal function with cr at 1,66, at it's baseline, will continue follow renal function and electrolytes. K at 4,8 and serum bicarbonate at 48.   6. Anemia of chronic kidney  disease. Hb at 8.9 with hct at 32, no need for blood transfusion, no signs of bleeding.   7. Anxiety and depression. Will resume lexapro and will continue to hold on lorazepam and temazepam for now to prevent worsening encephalopathy.      DVT prophylaxis: enoxaparin  Code Status: Full  Family Communication: No family at the bedside  Disposition Plan: Home    Consultants:   Pulmonary   Procedures:     Antimicrobials:   Levofloxacin     Subjective: Patient has been on continuous bipap. Yesterday very confused and altered per nursing report at the bedside, this am more awake and responsive, no chest pain, nausea or vomiting. Noted with difficulty swallowing.   Objective: Vitals:   01/31/17 0500 01/31/17 0600 01/31/17 0700 01/31/17 0801  BP: 120/75 127/89 (!) 147/75   Pulse: 64 61 73 71  Resp: 17 16 19  (!) 26  Temp:   97 F (36.1 C)   TempSrc:   Axillary   SpO2: 91% 93% 96% 97%  Weight:      Height:        Intake/Output Summary (Last 24 hours) at 01/31/17 0813 Last data filed at 01/31/17 0400  Gross per 24 hour  Intake                0 ml  Output             2010 ml  Net            -2010 ml   Filed Weights   01/25/17 0203 01/27/17 1713 01/30/17 1241  Weight: 112.5 kg (248 lb) 117 kg (257 lb 15 oz) 114.8 kg (253 lb  1.4 oz)    Examination:  General exam: deconditioned, on full face mask bipap Respiratory system: decrease air movement. No significant wheezing, rales or rhonchi. Decreased breath sounds at bases.  Respiratory effort normal. Cardiovascular system: S1 & S2 heard, RRR. No JVD, murmurs, rubs, gallops or clicks. No pedal edema. Gastrointestinal system: Abdomen is nondistended, soft and nontender. No organomegaly or masses felt. Normal bowel sounds heard. Central nervous system: Alert and oriented. No focal neurological deficits. Follows commands and responds to simple questions.  Extremities: Symmetric 5 x 5 power. Skin: No rashes, lesions or  ulcers  Data Reviewed: I have personally reviewed following labs and imaging studies  CBC:  Recent Labs Lab 01/25/17 0344 01/26/17 0205 01/28/17 0411 01/30/17 0522 01/31/17 0252  WBC 8.0 8.7 7.9 8.4 7.4  HGB 8.3* 8.0* 8.4* 8.7* 8.9*  HCT 29.4* 27.5* 29.6* 31.8* 32.0*  MCV 88.8 85.7 88.9 89.1 88.2  PLT 225 244 254 267 0000000   Basic Metabolic Panel:  Recent Labs Lab 01/25/17 0057 01/25/17 0344 01/26/17 0205 01/28/17 0411 01/30/17 0522 01/31/17 0252  NA  --  140 138 140 142 142  K  --  3.9 4.2 4.7 4.3 4.6  CL  --  89* 89* 90* 92* 91*  CO2  --  40* 39* 43* 43* 48*  GLUCOSE  --  137* 167* 134* 111* 145*  BUN  --  10 17 27* 29* 27*  CREATININE  --  1.55* 1.64* 1.64* 1.63* 1.66*  CALCIUM  --  8.6* 8.9 8.9 8.6* 8.5*  MG 2.1  --   --   --   --   --   PHOS 3.2  --   --   --   --   --    GFR: Estimated Creatinine Clearance: 37.3 mL/min (by C-G formula based on SCr of 1.66 mg/dL (H)). Liver Function Tests:  Recent Labs Lab 01/25/17 0344  AST 15  ALT 10*  ALKPHOS 53  BILITOT 0.8  PROT 6.3*  ALBUMIN 2.9*   No results for input(s): LIPASE, AMYLASE in the last 168 hours. No results for input(s): AMMONIA in the last 168 hours. Coagulation Profile: No results for input(s): INR, PROTIME in the last 168 hours. Cardiac Enzymes:  Recent Labs Lab 01/25/17 0057 01/25/17 0344  TROPONINI <0.03 <0.03   BNP (last 3 results) No results for input(s): PROBNP in the last 8760 hours. HbA1C: No results for input(s): HGBA1C in the last 72 hours. CBG:  Recent Labs Lab 01/30/17 1959 01/30/17 2345 01/31/17 0307 01/31/17 0748  GLUCAP 173* 136* 133* 158*   Lipid Profile: No results for input(s): CHOL, HDL, LDLCALC, TRIG, CHOLHDL, LDLDIRECT in the last 72 hours. Thyroid Function Tests: No results for input(s): TSH, T4TOTAL, FREET4, T3FREE, THYROIDAB in the last 72 hours. Anemia Panel: No results for input(s): VITAMINB12, FOLATE, FERRITIN, TIBC, IRON, RETICCTPCT in the last  72 hours. Sepsis Labs: No results for input(s): PROCALCITON, LATICACIDVEN in the last 168 hours.  Recent Results (from the past 240 hour(s))  MRSA PCR Screening     Status: None   Collection Time: 01/25/17  2:22 AM  Result Value Ref Range Status   MRSA by PCR NEGATIVE NEGATIVE Final    Comment:        The GeneXpert MRSA Assay (FDA approved for NASAL specimens only), is one component of a comprehensive MRSA colonization surveillance program. It is not intended to diagnose MRSA infection nor to guide or monitor treatment for MRSA infections.  Radiology Studies: Dg Chest Port 1 View  Result Date: 01/30/2017 CLINICAL DATA:  Difficulty breathing EXAM: PORTABLE CHEST 1 VIEW COMPARISON:  01/27/2017 FINDINGS: Cardiac shadow remains enlarged. The lungs are again well aerated without focal infiltrate or sizable effusion. Stable scarring in the bases is noted. No bony abnormality is seen. IMPRESSION: No acute abnormality seen. Electronically Signed   By: Inez Catalina M.D.   On: 01/30/2017 11:30        Scheduled Meds: . amLODipine  2.5 mg Oral Daily  . aspirin  81 mg Oral Daily  . enoxaparin (LOVENOX) injection  40 mg Subcutaneous Q24H  . famotidine  20 mg Oral BID  . ipratropium-albuterol  3 mL Nebulization Q6H  . levofloxacin (LEVAQUIN) IV  500 mg Intravenous Q24H  . linaclotide  145 mcg Oral QAC breakfast  . mouth rinse  15 mL Mouth Rinse BID  . methylPREDNISolone (SOLU-MEDROL) injection  40 mg Intravenous BID  . metoprolol  50 mg Oral BID  . multivitamin with minerals  1 tablet Oral Daily  . potassium chloride SA  20 mEq Oral Daily  . sodium chloride flush  3 mL Intravenous Q12H   Continuous Infusions:   LOS: 6 days        Mauricio Gerome Apley, MD [facility] Pager [phone]  If 7PM-7AM, please contact night-coverage [email or web address] Password [email or web address] 01/31/2017, 8:13 AM

## 2017-01-31 NOTE — Evaluation (Signed)
Clinical/Bedside Swallow Evaluation Patient Details  Name: Amanda Horton MRN: [id number] Date of Birth: 1946/09/01  Today's Date: 01/31/2017 Time: SLP Start Time (ACUTE ONLY): 1000 SLP Stop Time (ACUTE ONLY): 1015 SLP Time Calculation (min) (ACUTE ONLY): 15 min  Past Medical History:  Past Medical History:  Diagnosis Date  . Acute hyperkalemia   . Acute hypokalemia   . Acute on chronic renal failure ([place])   . Allergic rhinitis   . Anemia due to chronic kidney disease   . APC (atrial premature contractions)   . Arthritis of lumbar spine ([place])   . Benign essential hypertension   . Borderline diabetes   . Chronic insomnia   . Chronic venous insufficiency   . COPD (chronic obstructive pulmonary disease) ([place])   . GERD (gastroesophageal reflux disease)   . Hyperlipemia   . Hypoxia   . Lumbar disc narrowing   . On home oxygen therapy   . OSA (obstructive sleep apnea)   . Osteoporosis   . Respiratory failure requiring intubation ([place])   . Spinal stenosis of lumbar region without neurogenic claudication   . Varicose veins with pain   . Vitamin B12 deficiency   . Vitamin D deficiency    Past Surgical History:  Past Surgical History:  Procedure Laterality Date  . APPLICATION OF A-CELL OF EXTREMITY Right 08/30/2016   Procedure: APPLICATION OF A-CELL OF EXTREMITY;  Surgeon: Loel Lofty Dillingham, DO;  Location: [facility];  Service: Plastics;  Laterality: Right;  . APPLICATION OF A-CELL OF EXTREMITY Right 09/20/2016   Procedure: APPLICATION OF A-CELL OF right lower leg wound;  Surgeon: Loel Lofty Dillingham, DO;  Location: [facility];  Service: Plastics;  Laterality: Right;  . APPLICATION OF WOUND VAC Right 08/30/2016   Procedure: APPLICATION OF WOUND VAC;  Surgeon: Loel Lofty Dillingham, DO;  Location: [facility];  Service: Plastics;  Laterality: Right;  . APPLICATION OF WOUND VAC Right 09/20/2016   Procedure: APPLICATION OF WOUND VAC right lower leg wound;  Surgeon: Loel Lofty Dillingham, DO;  Location:  [facility];  Service: Plastics;  Laterality: Right;  . I&D EXTREMITY Right 08/30/2016   Procedure: IRRIGATION AND DEBRIDEMENT EXTREMITY RIGHT;  Surgeon: Loel Lofty Dillingham, DO;  Location: [facility];  Service: Plastics;  Laterality: Right;  . INCISION AND DRAINAGE OF WOUND Right 09/20/2016   Procedure: IRRIGATION AND DEBRIDEMENT right lower leg wound;  Surgeon: Loel Lofty Dillingham, DO;  Location: [facility];  Service: Plastics;  Laterality: Right;  . PARTIAL HYSTERECTOMY     HPI:  71 year old female with advance COPD who was fluid overloaded and developed acute on chronic hypercarbic respiratory failure.  Anticipate patient lives with a CO2 of 80 and when increased to 95 she developed altered mental status.  Patient was placed on BiPAP and diuresed with good response.    Assessment / Plan / Recommendation Clinical Impression  Pt demonstrates normal swallow ability despite just coming off BiPAP. Airway protection with 3 oz of water consumed consecutively results in no signs of aspiration or dysphagia. Voice is clear, cough is strong, pt denies history of dysphagia other than GERD. SLP completed oral care and discussed importance of oral care to reduce oral bacteria in setting of BiPAP use. Pt may consume regular solids and thin liquids when off the BiPAP. No SLP f/u needed.  SLP Visit Diagnosis: Dysphagia, oropharyngeal phase (R13.12)    Aspiration Risk  Mild aspiration risk    Diet Recommendation Regular;Thin liquid   Liquid Administration via: Cup;Straw Medication Administration: Whole  meds with puree Supervision: Patient able to self feed;Intermittent supervision to cue for compensatory strategies Compensations: Slow rate;Small sips/bites Postural Changes: Seated upright at 90 degrees    Other  Recommendations Oral Care Recommendations: Oral care QID   Follow up Recommendations None      Frequency and Duration            Prognosis        Swallow Study   General HPI: 71 year old female  with advance COPD who was fluid overloaded and developed acute on chronic hypercarbic respiratory failure.  Anticipate patient lives with a CO2 of 80 and when increased to 95 she developed altered mental status.  Patient was placed on BiPAP and diuresed with good response.  Type of Study: Bedside Swallow Evaluation Diet Prior to this Study: NPO Temperature Spikes Noted: No Respiratory Status: Nasal cannula (just came off BiPAP) History of Recent Intubation: No Behavior/Cognition: Alert;Cooperative;Pleasant mood Oral Cavity Assessment: Within Functional Limits;Dry Oral Care Completed by SLP: Yes Oral Cavity - Dentition: Adequate natural dentition Vision: Functional for self-feeding Self-Feeding Abilities: Able to feed self Patient Positioning: Upright in bed Baseline Vocal Quality: Normal Volitional Cough: Strong Volitional Swallow: Able to elicit    Oral/Motor/Sensory Function Overall Oral Motor/Sensory Function: Within functional limits   Ice Chips Ice chips: Within functional limits   Thin Liquid Thin Liquid: Within functional limits Presentation: Cup;Straw;Self Fed    Nectar Thick Nectar Thick Liquid: Not tested   Honey Thick Honey Thick Liquid: Not tested   Puree Puree: Within functional limits   Solid   GO   Solid: Not tested (pt politely refused)       Herbie Baltimore, MA CCC-SLP [phone]  Honor [place], Katherene Ponto 01/31/2017,10:23 AM

## 2017-01-31 NOTE — Progress Notes (Signed)
CRITICAL VALUE ALERT  Critical value received:  Pco2 87  Date of notification:  01/31/2017  Time of notification:  0420  Critical value read back Yes Nurse who received alert: Purcell Nails MD notified (1st page):  yes  Time of first page:  0430  MD notified (2nd page):  Time of second page:  Responding MD:  Dr Oletta Darter  Time MD responded:  [phone]

## 2017-01-31 NOTE — Progress Notes (Signed)
I stopped by to meet with Amanda Horton this evening.  She reports that she is "much, much better" today.  I introduced palliative care as specialized medical care for people living with serious illness. It focuses on providing relief from the symptoms and stress of a serious illness. The goal is to improve quality of life for both the patient and the family.  She reports that she wants her son, Amanda Horton, to be present when we have further discussion.  He will be in tomorrow morning around 10AM per her report.  I attempted to call Amanda Horton this evening, but voicemail was not set up.   I will follow up tomorrow at [place], [place] Team [phone]

## 2017-02-01 DIAGNOSIS — D631 Anemia in chronic kidney disease: Secondary | ICD-10-CM

## 2017-02-01 DIAGNOSIS — Z7189 Other specified counseling: Secondary | ICD-10-CM

## 2017-02-01 LAB — BLOOD GAS, ARTERIAL
ACID-BASE EXCESS: 19.8 mmol/L — AB (ref 0.0–2.0)
Bicarbonate: 45.8 mmol/L — ABNORMAL HIGH (ref 20.0–28.0)
DELIVERY SYSTEMS: POSITIVE
DRAWN BY: 232811
Expiratory PAP: 6
FIO2: 0.3
Inspiratory PAP: 20
O2 SAT: 96 %
PATIENT TEMPERATURE: 98.6
PCO2 ART: 73.6 mmHg — AB (ref 32.0–48.0)
PH ART: 7.411 (ref 7.350–7.450)
PO2 ART: 80.2 mmHg — AB (ref 83.0–108.0)

## 2017-02-01 LAB — BASIC METABOLIC PANEL
ANION GAP: 8 (ref 5–15)
BUN: 27 mg/dL — AB (ref 6–20)
CHLORIDE: 89 mmol/L — AB (ref 101–111)
CO2: 43 mmol/L — ABNORMAL HIGH (ref 22–32)
Calcium: 8.3 mg/dL — ABNORMAL LOW (ref 8.9–10.3)
Creatinine, Ser: 1.67 mg/dL — ABNORMAL HIGH (ref 0.44–1.00)
GFR, EST AFRICAN AMERICAN: 34 mL/min — AB (ref >60)
GFR, EST NON AFRICAN AMERICAN: 30 mL/min — AB (ref >60)
Glucose, Bld: 183 mg/dL — ABNORMAL HIGH (ref 65–99)
POTASSIUM: 4.3 mmol/L (ref 3.5–5.1)
SODIUM: 140 mmol/L (ref 135–145)

## 2017-02-01 LAB — GLUCOSE, CAPILLARY
GLUCOSE-CAPILLARY: 165 mg/dL — AB (ref 65–99)
Glucose-Capillary: 263 mg/dL — ABNORMAL HIGH (ref 65–99)

## 2017-02-01 MED ORDER — TRAZODONE HCL 50 MG PO TABS
25.0000 mg | ORAL_TABLET | Freq: Once | ORAL | Status: AC
  Administered 2017-02-01: 25 mg via ORAL
  Filled 2017-02-01: qty 1

## 2017-02-01 MED ORDER — IPRATROPIUM-ALBUTEROL 0.5-2.5 (3) MG/3ML IN SOLN
3.0000 mL | Freq: Three times a day (TID) | RESPIRATORY_TRACT | Status: DC
  Administered 2017-02-02 – 2017-02-03 (×4): 3 mL via RESPIRATORY_TRACT
  Filled 2017-02-01 (×4): qty 3

## 2017-02-01 MED ORDER — METHYLPREDNISOLONE SODIUM SUCC 40 MG IJ SOLR
40.0000 mg | Freq: Every day | INTRAMUSCULAR | Status: DC
  Administered 2017-02-01 – 2017-02-03 (×3): 40 mg via INTRAVENOUS
  Filled 2017-02-01 (×3): qty 1

## 2017-02-01 NOTE — Care Management Note (Signed)
Case Management Note Original Note created by Elissa Hefty 01/31/17  Patient Details  Name: Breonica Gauna MRN: [id number] Date of Birth: 1946/04/09  Subjective/Objective:     Adm w resp failure, on bipap here               Action/Plan:spoke w pt. She has bipap at home w [facility] but not sure her machine working correctly. Spoke w [facility] and da had also called to have eq checked. [facility] resp there going to home today. Pt had sleep study w dr Merian Capron.   Expected Discharge Date:                  Expected Discharge Plan:     In-House Referral:  Clinical Social Work  Discharge planning Services  CM Consult  Post Acute Care Choice:    Choice offered to:     DME Arranged:  Bipap DME Agency:  [place]  HH Arranged:    [place] Agency:     Status of Service:  In process, will continue to follow  If discussed at Long Length of Stay Meetings, dates discussed:    Additional Comments:cont to follow. 02/01/2017 Pt transferred to SD now.  CM contacted [facility] to ask for verification that BIPAP machine has been serviced - CM spoke with Abbe Amsterdam at [place] to follow back up with CM.  Pt and family are actively in [place] meeting now to discuss goals of care - CM will follow up with pt/family to offer choice and verify 24 hour supervision at discharge as recommended.   Maryclare Labrador, RN 02/01/2017, 11:30 AM

## 2017-02-01 NOTE — Progress Notes (Signed)
Pt requested to take  BiPap off around 0430 this Am. O2 at 2L via nasal cannula resumed

## 2017-02-01 NOTE — Progress Notes (Signed)
PROGRESS NOTE    Amanda Horton  [id number] DOB: March 12, 1946 DOA: 01/24/2017 PCP: Nicoletta Dress, MD    Brief Narrative:  71 yo female with COPD, presents with the chief complain of worsening dyspnea, confusion, wheezing and increased mucus production. On the initial evaluation found obtunded and somnolent, positive rhonchi. Admitted to the step down unit on non invasive mechanical ventilation, due to hypercapnic respiratory failure. Not able to completely liberated from bipap, transferred to the medical ward but had worsening symptoms that required transfer back to step down unit.    Assessment & Plan:   Principal Problem:   Acute respiratory failure with hypercapnia (HCC) Active Problems:   GERD (gastroesophageal reflux disease)   OSA (obstructive sleep apnea)   Essential hypertension   COPD exacerbation (HCC)   Anemia of chronic kidney failure   Anxiety and depression   1. Acute on chronic hypoxic and hypercapnic respiratory failure. Improved dyspnea, pH this am normal. Elevated pco chronic and probably at it's baseline. Continue bipap only at night.  Taper systemic steroids, continue bronchodilator therapy. Swallow evaluation recommendation for regular diet with thin liquids. Physical therapy evaluation.   2. COPD. Continue oxymetry monitoring, systemic steroids and bronchodilator therapy.   3. OSA. Patient on bipap at home.    4. HTN. Systolic blood pressure [id number] to 160, continue amlodipine and metoprolol.   5. 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 Renal function with cr at 1,67, k at 4,3. Avoid hypotension or nephrotoxic medications.  6. Anemia of chronic kidney disease. Follow as outpatient.    7. Anxiety and depression. Back on lexapro, avoid benzodiazepines.   8. Diastolic heart failure. Continue blood pressure control with amlopdipine and metoprolol, continue diuresis with furosemide to keep negative fluid balance.    DVT prophylaxis: enoxaparin  Code Status: Full  Family  Communication: No family at the bedside  Disposition Plan: Home    Consultants:   Pulmonary   Procedures:     Antimicrobials:        Subjective: Patient with improved dyspnea, no chest pain, no nausea or vomiting. Used bipap during the day yesterday. Has not been out of the bed yet.   Objective: Vitals:   02/01/17 0300 02/01/17 0323 02/01/17 0342 02/01/17 0730  BP:   (!) 153/80   Pulse: 66 67 70   Resp: 18 (!) 25 (!) 24   Temp:      TempSrc:      SpO2: 96% 96% 96% 97%  Weight:      Height:        Intake/Output Summary (Last 24 hours) at 02/01/17 0807 Last data filed at 01/31/17 2000  Gross per 24 hour  Intake              940 ml  Output             1325 ml  Net             -385 ml   Filed Weights   01/25/17 0203 01/27/17 1713 01/30/17 1241  Weight: 112.5 kg (248 lb) 117 kg (257 lb 15 oz) 114.8 kg (253 lb 1.4 oz)    Examination:  General exam: deconditioned E ENT: mild pallor, oral mucosa moist. No icterus.   Respiratory system: Decreased air movement, no wheezing, or rales.  Cardiovascular system: S1 & S2 heard, RRR. No JVD, murmurs, rubs, gallops or clicks. Trace edema. Gastrointestinal system: Abdomen is nondistended, soft and nontender. No organomegaly or masses felt. Normal bowel sounds heard. Central nervous system: Alert  and oriented. No focal neurological deficits. Extremities: Symmetric 5 x 5 power. Skin: No rashes, lesions or ulcers   Data Reviewed: I have personally reviewed following labs and imaging studies  CBC:  Recent Labs Lab 01/26/17 0205 01/28/17 0411 01/30/17 0522 01/31/17 0252  WBC 8.7 7.9 8.4 7.4  HGB 8.0* 8.4* 8.7* 8.9*  HCT 27.5* 29.6* 31.8* 32.0*  MCV 85.7 88.9 89.1 88.2  PLT 244 254 267 0000000   Basic Metabolic Panel:  Recent Labs Lab 01/26/17 0205 01/28/17 0411 01/30/17 0522 01/31/17 0252 02/01/17 0627  NA 138 140 142 142 140  K 4.2 4.7 4.3 4.6 4.3  CL 89* 90* 92* 91* 89*  CO2 39* 43* 43* 48* 43*    GLUCOSE 167* 134* 111* 145* 183*  BUN 17 27* 29* 27* 27*  CREATININE 1.64* 1.64* 1.63* 1.66* 1.67*  CALCIUM 8.9 8.9 8.6* 8.5* 8.3*   GFR: Estimated Creatinine Clearance: 37.1 mL/min (by C-G formula based on SCr of 1.67 mg/dL (H)). Liver Function Tests: No results for input(s): AST, ALT, ALKPHOS, BILITOT, PROT, ALBUMIN in the last 168 hours. No results for input(s): LIPASE, AMYLASE in the last 168 hours. No results for input(s): AMMONIA in the last 168 hours. Coagulation Profile: No results for input(s): INR, PROTIME in the last 168 hours. Cardiac Enzymes: No results for input(s): CKTOTAL, CKMB, CKMBINDEX, TROPONINI in the last 168 hours. BNP (last 3 results) No results for input(s): PROBNP in the last 8760 hours. HbA1C: No results for input(s): HGBA1C in the last 72 hours. CBG:  Recent Labs Lab 01/31/17 0748 01/31/17 1132 01/31/17 1651 01/31/17 2023 02/01/17 0007  GLUCAP 158* 166* 216* 203* 165*   Lipid Profile: No results for input(s): CHOL, HDL, LDLCALC, TRIG, CHOLHDL, LDLDIRECT in the last 72 hours. Thyroid Function Tests: No results for input(s): TSH, T4TOTAL, FREET4, T3FREE, THYROIDAB in the last 72 hours. Anemia Panel: No results for input(s): VITAMINB12, FOLATE, FERRITIN, TIBC, IRON, RETICCTPCT in the last 72 hours. Sepsis Labs: No results for input(s): PROCALCITON, LATICACIDVEN in the last 168 hours.  Recent Results (from the past 240 hour(s))  MRSA PCR Screening     Status: None   Collection Time: 01/25/17  2:22 AM  Result Value Ref Range Status   MRSA by PCR NEGATIVE NEGATIVE Final    Comment:        The GeneXpert MRSA Assay (FDA approved for NASAL specimens only), is one component of a comprehensive MRSA colonization surveillance program. It is not intended to diagnose MRSA infection nor to guide or monitor treatment for MRSA infections.          Radiology Studies: Dg Chest Port 1 View  Result Date: 01/30/2017 CLINICAL DATA:  Difficulty  breathing EXAM: PORTABLE CHEST 1 VIEW COMPARISON:  01/27/2017 FINDINGS: Cardiac shadow remains enlarged. The lungs are again well aerated without focal infiltrate or sizable effusion. Stable scarring in the bases is noted. No bony abnormality is seen. IMPRESSION: No acute abnormality seen. Electronically Signed   By: Inez Catalina M.D.   On: 01/30/2017 11:30        Scheduled Meds: . amLODipine  2.5 mg Oral Daily  . aspirin  81 mg Oral Daily  . enoxaparin (LOVENOX) injection  40 mg Subcutaneous Q24H  . escitalopram  20 mg Oral Daily  . famotidine  20 mg Oral BID  . furosemide  40 mg Oral BID  . ipratropium-albuterol  3 mL Nebulization Q6H  . linaclotide  145 mcg Oral QAC breakfast  . mouth rinse  15 mL Mouth Rinse BID  . methylPREDNISolone (SOLU-MEDROL) injection  40 mg Intravenous Daily  . metoprolol  50 mg Oral BID  . multivitamin with minerals  1 tablet Oral Daily  . potassium chloride SA  20 mEq Oral Daily  . sodium chloride flush  3 mL Intravenous Q12H   Continuous Infusions:   LOS: 7 days        Travia Onstad Gerome Apley, MD [facility] Pager [phone]  If 7PM-7AM, please contact night-coverage [email or web address] Password [email or web address] 02/01/2017, 8:07 AM

## 2017-02-01 NOTE — Care Management Note (Signed)
Case Management Note  Patient Details  Name: Amanda Horton MRN: [id number] Date of Birth: 26-Dec-1945  Subjective/Objective:   Pt lives with family, and per son, Cecilie Lowers, a family member is with her 24/7.  She has walker, BSC, and home O2 and BiPAP through [facility].  Previous CM called [facility] to request home visit to check BiPAP as pt stated it was not working correctly - states hospital BiPAP is working and CM will note settings to provide [place].  Pt will need home health RN, PT, and OT, [facility].                              Expected Discharge Plan:  [place]  Discharge planning Services  CM Consult  Post Acute Care Choice:  Home Health Choice offered to:  Patient  HH Arranged:  RN, PT, OT [facility] Agency:  [place]  Status of Service:  In process, will continue to follow  Girard Cooter, RN 02/01/2017, 2:12 PM

## 2017-02-02 LAB — BLOOD GAS, ARTERIAL
Acid-Base Excess: 19.7 mmol/L — ABNORMAL HIGH (ref 0.0–2.0)
Bicarbonate: 47 mmol/L — ABNORMAL HIGH (ref 20.0–28.0)
Drawn by: 275531
O2 CONTENT: 2 L/min
O2 SAT: 91.3 %
PATIENT TEMPERATURE: 98.6
pCO2 arterial: 99.9 mmHg (ref 32.0–48.0)
pH, Arterial: 7.294 — ABNORMAL LOW (ref 7.350–7.450)
pO2, Arterial: 67.7 mmHg — ABNORMAL LOW (ref 83.0–108.0)

## 2017-02-02 LAB — BASIC METABOLIC PANEL
ANION GAP: 9 (ref 5–15)
BUN: 23 mg/dL — ABNORMAL HIGH (ref 6–20)
CO2: 46 mmol/L — AB (ref 22–32)
Calcium: 8.6 mg/dL — ABNORMAL LOW (ref 8.9–10.3)
Chloride: 88 mmol/L — ABNORMAL LOW (ref 101–111)
Creatinine, Ser: 1.7 mg/dL — ABNORMAL HIGH (ref 0.44–1.00)
GFR calc Af Amer: 34 mL/min — ABNORMAL LOW (ref >60)
GFR, EST NON AFRICAN AMERICAN: 29 mL/min — AB (ref >60)
GLUCOSE: 105 mg/dL — AB (ref 65–99)
POTASSIUM: 3.6 mmol/L (ref 3.5–5.1)
Sodium: 143 mmol/L (ref 135–145)

## 2017-02-02 MED ORDER — ONDANSETRON HCL 4 MG/2ML IJ SOLN
4.0000 mg | Freq: Once | INTRAMUSCULAR | Status: DC
Start: 2017-02-02 — End: 2017-02-03
  Filled 2017-02-02: qty 2

## 2017-02-02 MED ORDER — ONDANSETRON HCL 4 MG/2ML IJ SOLN
4.0000 mg | Freq: Once | INTRAMUSCULAR | Status: AC
  Administered 2017-02-02: 4 mg via INTRAVENOUS
  Filled 2017-02-02: qty 2

## 2017-02-02 MED ORDER — ALBUTEROL SULFATE (2.5 MG/3ML) 0.083% IN NEBU: 2.5000 mg | INHALATION_SOLUTION | RESPIRATORY_TRACT | 0 refills | Status: AC | PRN

## 2017-02-02 MED ORDER — PREDNISONE 20 MG PO TABS
20.0000 mg | ORAL_TABLET | Freq: Every day | ORAL | 0 refills | Status: AC
Start: 2017-02-02 — End: 2017-02-05

## 2017-02-02 NOTE — Discharge Instructions (Signed)
02/02/2017 CPAP settings :  20/6 with 2-3 liter oxygen bleed in.

## 2017-02-02 NOTE — Progress Notes (Signed)
Patient is complaining of nausea. MD notified. Zofran ordered. Medication given to patient. Will continue to monitor.

## 2017-02-02 NOTE — Progress Notes (Signed)
When I walked into the room, the patient was very difficult to arouse. It took approximately 10 minutes to get the patient fully awake. While awake, she was confused. During ambulation, her O2 saturation fell to 85% on 2l. MD notified. Arrien, MD notified. MD stated that he would cancel the discharge orders for tonight. Will continue to monitor.

## 2017-02-02 NOTE — Care Management Important Message (Signed)
Important Message  Patient Details  Name: Amanda Horton MRN: [id number] Date of Birth: 1946-03-31   Medicare Important Message Given:  Yes    [name] 02/02/2017, 3:54 PM

## 2017-02-02 NOTE — Consult Note (Signed)
Consultation Note Date: 02/02/2017   Patient Name: Amanda Horton  DOB: October 07, 1946  MRN: [id number]  Age / Sex: 71 y.o., female  PCP: Amanda Dress, MD Referring Physician: Tawni Horton, *  Reason for Consultation: Establishing goals of care  HPI/Patient Profile: 71 y.o. female  with past medical history of COPD, chronic kidney disease, anemia, osteoarthritis, hypertension, insomnia, chronic venous insufficiency, GERD, hyperlipidemia, obstructive sleep apnea admitted on 01/24/2017 with acute on chronic hypoxic and hypercapnic respiratory failure.   Clinical Assessment and Goals of Care: I met today with patient, her son Amanda Horton, her daughter-in-law, and her sister Amanda Horton.   She reports that the most important thing to her is her family, feeling as well as possible for as long as possible, and being out of the hospital.  Amanda Horton states the doctors have been doing a good job explaining things to her and she understands that she has a chronic progressive illness that cannot be fixed. Both she and her son state that she was doing okay at home until she began to have trouble with her BiPAP machine. Since that time she has continued to decline to the point that she ended up in the hospital with this admission.  We discussed clinical course as well as wishes moving forward in regard to advanced directives.  Concepts specific to code status and rehospitalization discussed.  Values and goals of care important to patient and family were attempted to be elicited.  At this time, Amanda Horton is invested in plan to continue with current therapy and return to her home. She believes it of her BiPAP machine is repaired that she will be able to utilize it effectively which will manage her chronic hypercapnic respiratory failure at home.  We did talk a lot about advance care planning. We discussed about a most form and  completed 1 today. We also discussed naming a surrogate decision maker on her behalf if she is unable to make decisions on her own. She states this is something she will consider but does not want to do at this time.  Questions and concerns addressed.   PMT will continue to support holistically.  SUMMARY OF RECOMMENDATIONS    - I met with Amanda Horton, her son Amanda Horton, her daughter in law, and her sister, Amanda Horton. We reviewed a MOST form and discussed how to develop plan of care to focus on continuing therapies that would maximize chance of being well enough to return home and limiting therapies not in line with this goal.  We completed MOST form today. DNR, Limited additional interventions, IVF and ABX if indicated, no feeding tube.  Discussed Advanced directives and naming a surrogate decision maker.  She has 8 children and reports that she does not think they would all agree with her wishes for care at the end of her life. I advised her that she would benefit from naming whomever she thinks understands her wishes the best as the surrogate to act on her behalf.  She reports understanding this, but also  states this is something she will need to consider further.   Code Status/Advance Care Planning:  DNR  Palliative Prophylaxis:   Bowel Regimen  Psycho-social/Spiritual:   Desire for further Chaplaincy support:no  Additional Recommendations: Caregiving  Support/Resources  Prognosis:   Unable to determine  Discharge Planning: Home with Home Health      Primary Diagnoses: Present on Admission: . Acute respiratory failure with hypercapnia ([place]) . Anemia of chronic kidney failure . COPD exacerbation ([place]) . Essential hypertension . GERD (gastroesophageal reflux disease) . Anxiety and depression . OSA (obstructive sleep apnea)   I have reviewed the medical record, interviewed the patient and family, and examined the patient. The following aspects are pertinent.  Past Medical  History:  Diagnosis Date  . Acute hyperkalemia   . Acute hypokalemia   . Acute on chronic renal failure ([place])   . Allergic rhinitis   . Anemia due to chronic kidney disease   . APC (atrial premature contractions)   . Arthritis of lumbar spine ([place])   . Benign essential hypertension   . Borderline diabetes   . Chronic insomnia   . Chronic venous insufficiency   . COPD (chronic obstructive pulmonary disease) ([place])   . GERD (gastroesophageal reflux disease)   . Hyperlipemia   . Hypoxia   . Lumbar disc narrowing   . On home oxygen therapy   . OSA (obstructive sleep apnea)   . Osteoporosis   . Respiratory failure requiring intubation ([place])   . Spinal stenosis of lumbar region without neurogenic claudication   . Varicose veins with pain   . Vitamin B12 deficiency   . Vitamin D deficiency    Social History   Social History  . Marital status: Single    Spouse name: N/A  . Number of children: N/A  . Years of education: N/A   Occupational History  . Retired     [employer]   Social History Main Topics  . Smoking status: Former Smoker    Packs/day: 1.00    Years: 55.00    Types: Cigarettes    Quit date: 12/04/2012  . Smokeless tobacco: Never Used     Comment: Stopped in 2014  . Alcohol use No  . Drug use: No  . Sexual activity: Not Asked   Other Topics Concern  . None   Social History Narrative  . None   Family History  Problem Relation Age of Onset  . Emphysema Father   . Asthma Father   . Throat cancer Mother    Scheduled Meds: . amLODipine  2.5 mg Oral Daily  . aspirin  81 mg Oral Daily  . enoxaparin (LOVENOX) injection  40 mg Subcutaneous Q24H  . escitalopram  20 mg Oral Daily  . famotidine  20 mg Oral BID  . furosemide  40 mg Oral BID  . ipratropium-albuterol  3 mL Nebulization TID  . linaclotide  145 mcg Oral QAC breakfast  . mouth rinse  15 mL Mouth Rinse BID  . methylPREDNISolone (SOLU-MEDROL) injection  40 mg Intravenous Daily  . metoprolol  50  mg Oral BID  . multivitamin with minerals  1 tablet Oral Daily  . sodium chloride flush  3 mL Intravenous Q12H   Continuous Infusions: PRN Meds:.hydrALAZINE, ipratropium-albuterol, naLOXone (NARCAN)  injection Medications Prior to Admission:  Prior to Admission medications   Medication Sig Start Date End Date Taking? Authorizing Provider  albuterol (PROVENTIL HFA;VENTOLIN HFA) 108 (90 BASE) MCG/ACT inhaler Inhale 2 puffs into the lungs every  6 (six) hours as needed for wheezing or shortness of breath.   Yes Historical Provider, MD  alendronate (FOSAMAX) 70 MG tablet Take 70 mg by mouth every Saturday. Take with a full glass of water on an empty stomach.    Yes Historical Provider, MD  amLODipine (NORVASC) 2.5 MG tablet Take 1 tablet (2.5 mg total) by mouth daily. 12/12/16  Yes Courage Emokpae, MD  aspirin 81 MG tablet Take 81 mg by mouth daily.   Yes Historical Provider, MD  escitalopram (LEXAPRO) 20 MG tablet Take 20 mg by mouth daily.    Yes Historical Provider, MD  furosemide (LASIX) 20 MG tablet Take 20 mg by mouth daily.    Yes Historical Provider, MD  guaifenesin (HUMIBID E) 400 MG TABS tablet Take 800 mg by mouth 2 (two) times daily.   Yes Historical Provider, MD  ipratropium-albuterol (DUONEB) 0.5-2.5 (3) MG/3ML SOLN Take 3 mLs by nebulization 2 (two) times daily. Patient taking differently: Take 3 mLs by nebulization every 4 (four) hours as needed (for wheezing or shortness of breath).  12/13/16  Yes Bonnielee Haff, MD  linaclotide Rolan Lipa) 145 MCG CAPS capsule Take 145 mcg by mouth daily.    Yes Historical Provider, MD  metoprolol (LOPRESSOR) 50 MG tablet Take 1 tablet (50 mg total) by mouth 2 (two) times daily. 12/11/16  Yes Courage Emokpae, MD  mometasone-formoterol (DULERA) 200-5 MCG/ACT AERO Inhale 2 puffs into the lungs 2 (two) times daily. 12/29/16  Yes Tammy S Parrett, NP  omeprazole (PRILOSEC) 40 MG capsule Take 40 mg by mouth daily.  11/06/14  Yes Historical Provider, MD    ondansetron (ZOFRAN) 4 MG tablet Take 1 tablet (4 mg total) by mouth every 6 (six) hours as needed for nausea. 12/11/16  Yes Courage Emokpae, MD  oxyCODONE-acetaminophen (PERCOCET/ROXICET) 5-325 MG tablet Take 1 tablet by mouth every 8 (eight) hours as needed for moderate pain. 12/13/16  Yes Bonnielee Haff, MD  ranitidine (ZANTAC) 150 MG tablet Take 1 tablet by mouth 2 (two) times daily. 11/06/14  Yes Historical Provider, MD  Tiotropium Bromide Monohydrate 2.5 MCG/ACT AERS Inhale 2 puffs into the lungs daily. 12/11/16  Yes Courage Denton Brick, MD  Vitamin D, Ergocalciferol, (DRISDOL) 50000 UNITS CAPS capsule Take 50,000 Units by mouth every Saturday.  11/06/14  Yes Historical Provider, MD  albuterol (PROVENTIL) (2.5 MG/3ML) 0.083% nebulizer solution Take 3 mLs (2.5 mg total) by nebulization every 4 (four) hours as needed for wheezing or shortness of breath. 02/02/17 03/04/17  Mauricio Gerome Apley, MD  fluticasone furoate-vilanterol (BREO ELLIPTA) 200-25 MCG/INH AEPB Inhale 1 puff into the lungs daily.    Historical Provider, MD  guaiFENesin (MUCINEX) 600 MG 12 hr tablet Take 1 tablet (600 mg total) by mouth 2 (two) times daily. Patient not taking: Reported on 01/25/2017 12/11/16   Roxan Hockey, MD  LORazepam (ATIVAN) 1 MG tablet Take 1 tablet (1 mg total) by mouth every 8 (eight) hours as needed for anxiety. 12/13/16   Bonnielee Haff, MD  potassium chloride SA (K-DUR,KLOR-CON) 20 MEQ tablet Take 1 tablet (20 mEq total) by mouth daily. Patient not taking: Reported on 01/25/2017 12/11/16   Roxan Hockey, MD  predniSONE (DELTASONE) 20 MG tablet Take 1 tablet (20 mg total) by mouth daily with breakfast. 02/02/17 02/05/17  Mauricio Gerome Apley, MD  temazepam (RESTORIL) 15 MG capsule Take 1 capsule (15 mg total) by mouth at bedtime as needed for sleep. Patient not taking: Reported on 01/25/2017 12/13/16   Bonnielee Haff, MD   Allergies  Allergen Reactions  . Sulfa Antibiotics Nausea And Vomiting and Other (See Comments)   . Tape Other (See Comments)    SKIN IS VERY THIN AND WILL BRUISE & TEAR EASILY!!   Review of Systems  Constitutional: Positive for activity change and unexpected weight change.  Respiratory: Positive for shortness of breath.   Psychiatric/Behavioral: Positive for confusion and sleep disturbance.      Physical Exam  General: Alert, awake, in no acute distress.  Sitting in bedside chair HEENT: No bruits, no goiter, no JVD Heart: Regular rate and rhythm. No murmur appreciated. Lungs: Diminished air movement, clear Abdomen: Soft, nontender, nondistended, positive bowel sounds.  Ext: Trace edema Skin: Warm and dry Neuro: Grossly intact, nonfocal.   Vital Signs: BP 123/60 (BP Location: Left Arm)   Pulse 82   Temp 98.5 F (36.9 C) (Oral)   Resp 18   Ht '5\' 2"'$  (1.575 m)   Wt 110.2 kg (243 lb)   SpO2 95%   BMI 44.45 kg/m  Pain Assessment: No/denies pain   Pain Score: 0-No pain   SpO2: SpO2: 95 % O2 Device:SpO2: 95 % O2 Flow Rate: .O2 Flow Rate (L/min): 2 L/min  IO: Intake/output summary:  Intake/Output Summary (Last 24 hours) at 02/02/17 0853 Last data filed at 02/02/17 0200  Gross per 24 hour  Intake              600 ml  Output              400 ml  Net              200 ml    LBM: Last BM Date: 02/01/17 Baseline Weight: Weight: 112.5 kg (248 lb) Most recent weight: Weight: 110.2 kg (243 lb)     Palliative Assessment/Data:   Flowsheet Rows   Flowsheet Row Most Recent Value  Intake Tab  Referral Department  Critical care  Unit at Time of Referral  ICU  Palliative Care Primary Diagnosis  Pulmonary  Date Notified  01/30/17  Palliative Care Type  New Palliative care  Reason for referral  Clarify Goals of Care  Date of Admission  01/24/17  # of days IP prior to Palliative referral  6  Clinical Assessment  Psychosocial & Spiritual Assessment  Palliative Care Outcomes      Time In: 1050 Time Out: 1210 Time Total: 80 Greater than 50%  of this time was spent  counseling and coordinating care related to the above assessment and plan.  Signed by: Micheline Rough, MD   Please contact Palliative Medicine Team phone at [phone] for questions and concerns.  For individual provider: See Shea Evans

## 2017-02-02 NOTE — Discharge Summary (Signed)
Physician Discharge Summary  Amanda Horton [id number] DOB: 01-10-1946 DOA: 01/24/2017  PCP: Nicoletta Dress, MD  Admit date: 01/24/2017 Discharge date: 02/02/2017  Admitted From: Home  Disposition:  Home   Recommendations for Outpatient Follow-up:  1. Follow up with PCP in 1- week 2. Patient discharge on prednisone for 3 more days 3. Changed duoneb nebs for albuterol, patient on tiotropium.  4. Avoid benzodiazepines  Home Health: No  Equipment/Devices: Home 02, home Bipap.   Discharge Condition: Stable  CODE STATUS: Full  Diet recommendation: Heart Healthy   Brief/Interim Summary: This is a 71 year old female who presented to hospital with the chief complaint of shortness of breath, associated with confusion, wheezing and productive cough. On initial examination she was somnolent, blood pressure 133/65, heart 70, respiratory 23, oxygen saturation 95%. She had rhonchi bilaterally, heart S1-S2 present rhythmic, abdomen soft, extremities no edema. Sodium 142, potassium 4.1, chloride 90, bicarbonate 42, glucose 121, BUN 8, creatinine 1.52, white count 6.8, hemoglobin 8.9, hematocrit 32.0, platelets 243. PH 7.31, PCO2 94, PO2 111, oxygen saturation 98%, on a BiPAP. Chest x-ray with signs of hyperinflation and bibasilar atelectasis. EKG sinus rhythm with a left bundle branch block, Q waves in V2, poor R-wave progression.   The patient was admitted to hospital with the working diagnosis acute hypercapnic respiratory failure, complicated by metabolic encephalopathy.   1. Acute and chronic hypercapnic respiratory failure due to COPD exacerbation. The patient was mated to the stepdown unit, she was placed on noninvasive mechanical ventilation continuously. Received systemic steroids and levofloxacin. Aggressive bronchodilator therapy. Patient remained dependent on noninvasive mechanical ventilation during the first few days of her hospitalization. She improved, but on February 27 due to lack of  compliance with BiPAP she had worsening hypercapnia that required readmission to the stepdown unit. Over last 48 hours her symptoms improved, patient is using BiPAP only at night. She is off antibiotics and steroids being tapered off. Social services will be contacted to assure that her home BiPAP is working well.  2. Acute on chronic respiratory acidosis with chronic metabolic alkalosis. Patient's acid base improved with noninvasive mechanical ventilation and COPD treatment. Discharge pH is 7.41, her baseline PCO2 is around 70. She will continue noninvasive mechanical ventilation at night. Discharge bicarbonate is 46.  3. Chronic kidney disease stage 3/4. Kidney function remained stable, discharge creatinine 1.7, she will continue taken furosemide per her home regimen.  4. Hypertension. Blood pressure remained well-controlled with amlodipine and metoprolol.  5. Diastolic heart failure acute on chronic. Probably patient has cor pulmonale due to her advanced COPD and chronic respiratory failure, patient was diuresed with intravenous furosemide during his hospitalization achieving negative fluid balance, - 3,168 ml.   6. Anemia chronic disease. Discharge hemoglobin 8.9.  7. Anxiety/depression. Continue Lexapro. Avoid benzodiazepines.   8. Metabolic encephalopathy. Patient's symptoms improved with correction of acid base balance, physical therapy will evaluate patient before discharge, she may need home health services.  Discharge Diagnoses:  Principal Problem:   Acute respiratory failure with hypercapnia (HCC) Active Problems:   GERD (gastroesophageal reflux disease)   OSA (obstructive sleep apnea)   Essential hypertension   COPD exacerbation (HCC)   Anemia of chronic kidney failure   Anxiety and depression    Discharge Instructions   Allergies as of 02/02/2017      Reactions   Sulfa Antibiotics Nausea And Vomiting, Other (See Comments)   Tape Other (See Comments)   SKIN IS VERY THIN  AND WILL BRUISE & TEAR EASILY!!  Medication List    STOP taking these medications   ipratropium-albuterol 0.5-2.5 (3) MG/3ML Soln Commonly known as:  DUONEB   LORazepam 1 MG tablet Commonly known as:  ATIVAN   mometasone-formoterol 200-5 MCG/ACT Aero Commonly known as:  DULERA   potassium chloride SA 20 MEQ tablet Commonly known as:  K-DUR,KLOR-CON   temazepam 15 MG capsule Commonly known as:  RESTORIL     TAKE these medications   albuterol 108 (90 Base) MCG/ACT inhaler Commonly known as:  PROVENTIL HFA;VENTOLIN HFA Inhale 2 puffs into the lungs every 6 (six) hours as needed for wheezing or shortness of breath. What changed:  Another medication with the same name was added. Make sure you understand how and when to take each.   albuterol (2.5 MG/3ML) 0.083% nebulizer solution Commonly known as:  PROVENTIL Take 3 mLs (2.5 mg total) by nebulization every 4 (four) hours as needed for wheezing or shortness of breath. What changed:  You were already taking a medication with the same name, and this prescription was added. Make sure you understand how and when to take each.   alendronate 70 MG tablet Commonly known as:  FOSAMAX Take 70 mg by mouth every Saturday. Take with a full glass of water on an empty stomach.   amLODipine 2.5 MG tablet Commonly known as:  NORVASC Take 1 tablet (2.5 mg total) by mouth daily.   aspirin 81 MG tablet Take 81 mg by mouth daily.   BREO ELLIPTA 200-25 MCG/INH Aepb Generic drug:  fluticasone furoate-vilanterol Inhale 1 puff into the lungs daily.   escitalopram 20 MG tablet Commonly known as:  LEXAPRO Take 20 mg by mouth daily.   furosemide 20 MG tablet Commonly known as:  LASIX Take 20 mg by mouth daily.   guaifenesin 400 MG Tabs tablet Commonly known as:  HUMIBID E Take 800 mg by mouth 2 (two) times daily. What changed:  Another medication with the same name was removed. Continue taking this medication, and follow the directions  you see here.   LINZESS 145 MCG Caps capsule Generic drug:  linaclotide Take 145 mcg by mouth daily.   metoprolol 50 MG tablet Commonly known as:  LOPRESSOR Take 1 tablet (50 mg total) by mouth 2 (two) times daily.   omeprazole 40 MG capsule Commonly known as:  PRILOSEC Take 40 mg by mouth daily.   ondansetron 4 MG tablet Commonly known as:  ZOFRAN Take 1 tablet (4 mg total) by mouth every 6 (six) hours as needed for nausea.   oxyCODONE-acetaminophen 5-325 MG tablet Commonly known as:  PERCOCET/ROXICET Take 1 tablet by mouth every 8 (eight) hours as needed for moderate pain.   predniSONE 20 MG tablet Commonly known as:  DELTASONE Take 1 tablet (20 mg total) by mouth daily with breakfast.   ranitidine 150 MG tablet Commonly known as:  ZANTAC Take 1 tablet by mouth 2 (two) times daily.   Tiotropium Bromide Monohydrate 2.5 MCG/ACT Aers Inhale 2 puffs into the lungs daily.   Vitamin D (Ergocalciferol) 50000 units Caps capsule Commonly known as:  DRISDOL Take 50,000 Units by mouth every Saturday.      Follow-up Information    SCHULTZ,DOUGLAS E, MD Follow up in 1 week(s).   Specialty:  Internal Medicine Contact information: [place] [address] [phone]          Allergies  Allergen Reactions  . Sulfa Antibiotics Nausea And Vomiting and Other (See Comments)  . Tape Other (See  Comments)    SKIN IS VERY THIN AND WILL BRUISE & TEAR EASILY!!     Consultations:  Pulmonary    Procedures/Studies: Dg Chest 2 View  Result Date: 01/24/2017 CLINICAL DATA:  Shortness of breath and wheezing EXAM: CHEST  2 VIEW COMPARISON:  12/12/2016 FINDINGS: Mild to moderate cardiomegaly without overt failure. No effusion. Increasing bibasilar atelectasis or developing infiltrates. No pneumothorax. IMPRESSION: 1. Increasing bibasilar atelectasis or infiltrate 2. Stable cardiomegaly without overt failure Electronically Signed   By: Donavan Foil  M.D.   On: 01/24/2017 18:20   Dg Chest Port 1 View  Result Date: 01/30/2017 CLINICAL DATA:  Difficulty breathing EXAM: PORTABLE CHEST 1 VIEW COMPARISON:  01/27/2017 FINDINGS: Cardiac shadow remains enlarged. The lungs are again well aerated without focal infiltrate or sizable effusion. Stable scarring in the bases is noted. No bony abnormality is seen. IMPRESSION: No acute abnormality seen. Electronically Signed   By: Inez Catalina M.D.   On: 01/30/2017 11:30   Dg Chest Port 1 View  Result Date: 01/27/2017 CLINICAL DATA:  Shortness of breath. EXAM: PORTABLE CHEST 1 VIEW COMPARISON:  01/24/2017. FINDINGS: Trachea is midline. Heart is enlarged, stable. Mild scarring or atelectasis of the lung bases. Lungs are otherwise clear. No pleural fluid. IMPRESSION: Mild scarring or atelectasis at the lung bases. Electronically Signed   By: Lorin Picket M.D.   On: 01/27/2017 11:16    (Echo, Carotid, EGD, Colonoscopy, ERCP)    Subjective: Patient feeling better, dyspnea has improved, used bipap last night with no complications. No nausea or vomiting.   Discharge Exam: Vitals:   02/02/17 0300 02/02/17 0809  BP: (!) 152/60 123/60  Pulse: 88 82  Resp: 20 18  Temp:  98.5 F (36.9 C)   Vitals:   02/01/17 2216 02/01/17 2315 02/02/17 0300 02/02/17 0809  BP:   (!) 152/60 123/60  Pulse:  81 88 82  Resp:  18 20 18   Temp:    98.5 F (36.9 C)  TempSrc:    Oral  SpO2:  93% (!) 88% 95%  Weight: 110.2 kg (243 lb)     Height:        General: Pt is alert, awake, not in acute distress Cardiovascular: RRR, S1/S2 +, no rubs, no gallops Respiratory: CTA bilaterally,  no rhonchi, positive wheezing bilaterally diffuse on expiration.  Abdominal: Soft, NT, ND, bowel sounds + Extremities: no edema, no cyanosis    The results of significant diagnostics from this hospitalization (including imaging, microbiology, ancillary and laboratory) are listed below for reference.     Microbiology: Recent Results  (from the past 240 hour(s))  MRSA PCR Screening     Status: None   Collection Time: 01/25/17  2:22 AM  Result Value Ref Range Status   MRSA by PCR NEGATIVE NEGATIVE Final    Comment:        The GeneXpert MRSA Assay (FDA approved for NASAL specimens only), is one component of a comprehensive MRSA colonization surveillance program. It is not intended to diagnose MRSA infection nor to guide or monitor treatment for MRSA infections.      Labs: BNP (last 3 results)  Recent Labs  12/03/16 1545 01/25/17 0057  BNP 165.1* [id number]*   Basic Metabolic Panel:  Recent Labs Lab 01/28/17 0411 01/30/17 0522 01/31/17 0252 02/01/17 0627 02/02/17 0535  NA 140 142 142 140 143  K 4.7 4.3 4.6 4.3 3.6  CL 90* 92* 91* 89* 88*  CO2 43* 43* 48* 43* 46*  GLUCOSE 134* 111*  145* 183* 105*  BUN 27* 29* 27* 27* 23*  CREATININE 1.64* 1.63* 1.66* 1.67* 1.70*  CALCIUM 8.9 8.6* 8.5* 8.3* 8.6*   Liver Function Tests: No results for input(s): AST, ALT, ALKPHOS, BILITOT, PROT, ALBUMIN in the last 168 hours. No results for input(s): LIPASE, AMYLASE in the last 168 hours. No results for input(s): AMMONIA in the last 168 hours. CBC:  Recent Labs Lab 01/28/17 0411 01/30/17 0522 01/31/17 0252  WBC 7.9 8.4 7.4  HGB 8.4* 8.7* 8.9*  HCT 29.6* 31.8* 32.0*  MCV 88.9 89.1 88.2  PLT 254 267 233   Cardiac Enzymes: No results for input(s): CKTOTAL, CKMB, CKMBINDEX, TROPONINI in the last 168 hours. BNP: Invalid input(s): POCBNP CBG:  Recent Labs Lab 01/31/17 1132 01/31/17 1651 01/31/17 2023 02/01/17 0007 02/01/17 1641  GLUCAP 166* 216* 203* 165* 263*   D-Dimer No results for input(s): DDIMER in the last 72 hours. Hgb A1c No results for input(s): HGBA1C in the last 72 hours. Lipid Profile No results for input(s): CHOL, HDL, LDLCALC, TRIG, CHOLHDL, LDLDIRECT in the last 72 hours. Thyroid function studies No results for input(s): TSH, T4TOTAL, T3FREE, THYROIDAB in the last 72  hours.  Invalid input(s): FREET3 Anemia work up No results for input(s): VITAMINB12, FOLATE, FERRITIN, TIBC, IRON, RETICCTPCT in the last 72 hours. Urinalysis    Component Value Date/Time   COLORURINE YELLOW 12/03/2016 1535   APPEARANCEUR HAZY (A) 12/03/2016 1535   LABSPEC 1.013 12/03/2016 1535   PHURINE 5.0 12/03/2016 1535   GLUCOSEU NEGATIVE 12/03/2016 1535   HGBUR MODERATE (A) 12/03/2016 1535   BILIRUBINUR NEGATIVE 12/03/2016 1535   KETONESUR NEGATIVE 12/03/2016 1535   PROTEINUR 100 (A) 12/03/2016 1535   NITRITE NEGATIVE 12/03/2016 1535   LEUKOCYTESUR NEGATIVE 12/03/2016 1535   Sepsis Labs Invalid input(s): PROCALCITONIN,  WBC,  LACTICIDVEN Microbiology Recent Results (from the past 240 hour(s))  MRSA PCR Screening     Status: None   Collection Time: 01/25/17  2:22 AM  Result Value Ref Range Status   MRSA by PCR NEGATIVE NEGATIVE Final    Comment:        The GeneXpert MRSA Assay (FDA approved for NASAL specimens only), is one component of a comprehensive MRSA colonization surveillance program. It is not intended to diagnose MRSA infection nor to guide or monitor treatment for MRSA infections.      Time coordinating discharge: Over 45 minutes  SIGNED:   Tawni Millers, MD  [facility] 02/02/2017, 8:11 AM Pager   If 7PM-7AM, please contact night-coverage [email or web address] Password [email or web address]

## 2017-02-02 NOTE — Progress Notes (Signed)
Physical Therapy Treatment Patient Details Name: Amanda Horton MRN: [id number] DOB: 1946-08-18 Today's Date: 02/02/2017    History of Present Illness Pt is a 71 y/o female admitted from home secondary to SOB, confusion and COPD exacerbation. PMH including but not limited to CKD, HTN and COPD (dependent on 2L of O2 at all times).    PT Comments    Pt progressing slowly.  She has trouble with pursed lip breathing.  Sats continue to drop consistently into the mid 80's on 2 or 3 L [place].   Follow Up Recommendations  Home health PT;Supervision/Assistance - 24 hour     Equipment Recommendations  None recommended by PT    Recommendations for Other Services       Precautions / Restrictions      Mobility  Bed Mobility                  Transfers Overall transfer level: Needs assistance Equipment used: Rolling walker (2 wheeled) Transfers: Sit to/from Stand Sit to Stand: Min guard         General transfer comment: cues for hand placement  Ambulation/Gait Ambulation/Gait assistance: Min guard;Min assist Ambulation Distance (Feet): 60 Feet (x2) Assistive device: Rolling walker (2 wheeled) Gait Pattern/deviations: Step-through pattern;Decreased stride length;Trunk flexed Gait velocity: decreased   General Gait Details: mildly unsteady as she fatigues and struggles to exhale.   sats dropped to 79% on 2L [place] with EHR 90 bpm, and dropped to 87% on 3L [place] at 85 bpm   Stairs            Wheelchair Mobility    Modified Rankin (Stroke Patients Only)       Balance   Sitting-balance support: Feet supported Sitting balance-Leahy Scale: Good       Standing balance-Leahy Scale: Poor Standing balance comment: pt reliant on bilateral UEs on RW                    Cognition Arousal/Alertness: Awake/alert Behavior During Therapy: WFL for tasks assessed/performed Overall Cognitive Status: Within Functional Limits for tasks assessed                       Exercises      General Comments General comments (skin integrity, edema, etc.): Discussed and practiced pursed-lip breathing, but will need follow up as it does not come easy or natural to pt.      Pertinent Vitals/Pain Pain Assessment: No/denies pain    Home Living                      Prior Function            PT Goals (current goals can now be found in the care plan section) Acute Rehab PT Goals Patient Stated Goal: walk further distances without feeling SOB PT Goal Formulation: With patient/family Time For Goal Achievement: 02/10/17 Potential to Achieve Goals: Good Progress towards PT goals: Progressing toward goals    Frequency    Min 3X/week      PT Plan Current plan remains appropriate    Co-evaluation             End of Session   Activity Tolerance: Patient tolerated treatment well Patient left: in chair;with call bell/phone within reach;with family/visitor present Nurse Communication: Mobility status PT Visit Diagnosis: Other abnormalities of gait and mobility (R26.89)     Time: [id number] PT Time Calculation (min) (ACUTE ONLY): 20 min  Charges:  $Gait  Training: 8-22 mins                    G Codes:       Tessie Fass Leilanie Rauda 02/02/2017, 5:00 PM  02/02/2017  Donnella Sham, PT [phone]  (pager)

## 2017-02-02 NOTE — Consult Note (Signed)
[facility] CM Inpatient Consult   02/02/2017  Amanda Horton 12-18-1945 [id number]  Patient was seen for frequent admissions as a [facility] patient.  Chart reviewed per MD notes that This is a 71 year old female who presented to hospital with the chief complaint of shortness of breath, associated with confusion, wheezing and productive cough.  Chest x-ray with signs of hyperinflation and bibasilar atelectasis. HX of COPD Saw that Palliative Care consult has been done as well. Met with the patient who is sleeping on arrival and continued to sleep throughout the conversation just regarding [facility] services.  But her son Cecilie Lowers and  Levada Dy were at the bedside.  They states that the patient's biggest barrier is getting her BIPAP setting corrected at home.  They state Huey Romans was going to have this done online.  He was not sure but states that [facility] will be following due to [place] will not come back out.  Explained to him regarding [place] management services as available to her.  He states he will look over the brochure and share it with his sister, Butch Penny. Encouraged follow up.  Brochure given and updated inpatient RNCM on the conversation.  For questions, please contact:  Natividad Brood, RN BSN [facility] Liaison  [phone] business mobile phone Toll free office [phone]
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
## 2017-02-02 NOTE — Progress Notes (Signed)
CRITICAL VALUE ALERT  Critical value received: ABG: pH=7.29, Co2=99, Po2=67, HCO3=47  Date of notification: 02/02/17  Time of notification: [id number]  Critical value read back: Yes  Nurse who received alert: Cardell Peach  MD notified (1st page): Arrien  Time of first page: 1859  Responding MD: Arrien  Time MD responded: 83  MD stated to use patients bipap tonight and repeat ABG in the morning. Will continue to monitor.

## 2017-02-02 NOTE — Care Management Note (Deleted)
Case Management Note  Patient Details  Name: Mathew Coscarelli MRN: [id number] Date of Birth: 1946/11/24  Subjective/Objective:                    Action/Plan:   Expected Discharge Date:  02/02/17               Expected Discharge Plan:  [place]  In-House Referral:     Discharge planning Services  CM Consult  Post Acute Care Choice:  Home Health Choice offered to:  Patient  DME Arranged:    DME Agency:     HH Arranged:  RN, PT, OT [place] Agency:  [facility]   Status of Service:  In process, will continue to follow  If discussed at Long Length of Stay Meetings, dates discussed:    Additional Comments:  Adron Bene, RN 02/02/2017, 11:34 AM

## 2017-02-03 LAB — BLOOD GAS, ARTERIAL
ACID-BASE EXCESS: 22.8 mmol/L — AB (ref 0.0–2.0)
BICARBONATE: 49.3 mmol/L — AB (ref 20.0–28.0)
DRAWN BY: 283401
Delivery systems: POSITIVE
EXPIRATORY PAP: 4
Inspiratory PAP: 20
O2 Content: 2 L/min
O2 Saturation: 67.8 %
PATIENT TEMPERATURE: 98.6
pCO2 arterial: 83.5 mmHg (ref 32.0–48.0)
pH, Arterial: 7.389 (ref 7.350–7.450)
pO2, Arterial: 38.2 mmHg — CL (ref 83.0–108.0)

## 2017-02-03 NOTE — Progress Notes (Signed)
PROGRESS NOTE    Amanda Horton  [id number] DOB: [birth year] DOA: 01/24/2017 PCP: Nicoletta Dress, MD     Brief Narrative:  71 yo female with COPD, presents with the chief complain of worsening dyspnea, confusion, wheezing and increased mucus production. On the initial evaluation found obtunded and somnolent, positive rhonchi. Admitted to the step down unit on non invasive mechanical ventilation, due to hypercapnic respiratory failure. Not able to completely liberated from bipap, transferred to the medical ward but had worsening symptoms that required transfer back to step down unit. Patient responding well to nocturnal bipap. Yesterday discharge held due to somnolence and hypercapnia.    Assessment & Plan:   Principal Problem:   Acute respiratory failure with hypercapnia (HCC) Active Problems:   GERD (gastroesophageal reflux disease)   OSA (obstructive sleep apnea)   Essential hypertension   COPD exacerbation (HCC)   Anemia of chronic kidney failure   Anxiety and depression   1. Acute on chronic hypoxic and hypercapnic respiratory failure. Last night discharge held due to somnolence and worsening respiratory acidosis. Somnolence was transitory. Last night patient used bipap, this am patient is awake and alert, dyspnea at baseline. ABG with pH at 7,38.   Will discharge today with instructions to use bipap at night and as needed during the day when sleeping.    DVT prophylaxis:enoxaparin  Code Status:Full  Family Communication:No family at the bedside  Disposition Plan:Home    Consultants:  Pulmonary   Procedures:    Antimicrobials:   Subjective: Patient feeling better, dyspnea back to baseline, no further change in mentation, used bipap last night. Yesterday discharge was cancelled to patient's somnolence.   Objective: Vitals:   02/02/17 1613 02/02/17 2117 02/02/17 2216 02/03/17 0516  BP: (!) 113/48  123/63 113/79  Pulse: 68  78 83  Resp: 20 16  17 20   Temp: 98.2 F (36.8 C)  98.4 F (36.9 C) 98.2 F (36.8 C)  TempSrc: Oral     SpO2: 98%  98% (!) 72%  Weight:   110 kg (242 lb 8.1 oz)   Height:        Intake/Output Summary (Last 24 hours) at 02/03/17 0801 Last data filed at 02/03/17 0600  Gross per 24 hour  Intake              480 ml  Output                0 ml  Net              480 ml   Filed Weights   01/30/17 1241 02/01/17 2216 02/02/17 2216  Weight: 114.8 kg (253 lb 1.4 oz) 110.2 kg (243 lb) 110 kg (242 lb 8.1 oz)    Examination:  General exam: Appears calm and comfortable  E ENT: mild pallor but no icterus Respiratory system: Clear to auscultation. Respiratory effort normal. Expiratory wheezing. Cardiovascular system: S1 & S2 heard, RRR. No JVD, murmurs, rubs, gallops or clicks. No pedal edema. Gastrointestinal system: Abdomen is nondistended, soft and nontender. No organomegaly or masses felt. Normal bowel sounds heard. Central nervous system: Alert and oriented. No focal neurological deficits. Extremities: Symmetric 5 x 5 power. Skin: No rashes, lesions or ulcers     Data Reviewed: I have personally reviewed following labs and imaging studies  CBC:  Recent Labs Lab 01/28/17 0411 01/30/17 0522 01/31/17 0252  WBC 7.9 8.4 7.4  HGB 8.4* 8.7* 8.9*  HCT 29.6* 31.8* 32.0*  MCV 88.9 89.1 88.2  PLT  254 267 0000000   Basic Metabolic Panel:  Recent Labs Lab 01/28/17 0411 01/30/17 0522 01/31/17 0252 02/01/17 0627 02/02/17 0535  NA 140 142 142 140 143  K 4.7 4.3 4.6 4.3 3.6  CL 90* 92* 91* 89* 88*  CO2 43* 43* 48* 43* 46*  GLUCOSE 134* 111* 145* 183* 105*  BUN 27* 29* 27* 27* 23*  CREATININE 1.64* 1.63* 1.66* 1.67* 1.70*  CALCIUM 8.9 8.6* 8.5* 8.3* 8.6*   GFR: Estimated Creatinine Clearance: 35.5 mL/min (by C-G formula based on SCr of 1.7 mg/dL (H)). Liver Function Tests: No results for input(s): AST, ALT, ALKPHOS, BILITOT, PROT, ALBUMIN in the last 168 hours. No results for input(s): LIPASE,  AMYLASE in the last 168 hours. No results for input(s): AMMONIA in the last 168 hours. Coagulation Profile: No results for input(s): INR, PROTIME in the last 168 hours. Cardiac Enzymes: No results for input(s): CKTOTAL, CKMB, CKMBINDEX, TROPONINI in the last 168 hours. BNP (last 3 results) No results for input(s): PROBNP in the last 8760 hours. HbA1C: No results for input(s): HGBA1C in the last 72 hours. CBG:  Recent Labs Lab 01/31/17 1132 01/31/17 1651 01/31/17 2023 02/01/17 0007 02/01/17 1641  GLUCAP 166* 216* 203* 165* 263*   Lipid Profile: No results for input(s): CHOL, HDL, LDLCALC, TRIG, CHOLHDL, LDLDIRECT in the last 72 hours. Thyroid Function Tests: No results for input(s): TSH, T4TOTAL, FREET4, T3FREE, THYROIDAB in the last 72 hours. Anemia Panel: No results for input(s): VITAMINB12, FOLATE, FERRITIN, TIBC, IRON, RETICCTPCT in the last 72 hours. Sepsis Labs: No results for input(s): PROCALCITON, LATICACIDVEN in the last 168 hours.  Recent Results (from the past 240 hour(s))  MRSA PCR Screening     Status: None   Collection Time: 01/25/17  2:22 AM  Result Value Ref Range Status   MRSA by PCR NEGATIVE NEGATIVE Final    Comment:        The GeneXpert MRSA Assay (FDA approved for NASAL specimens only), is one component of a comprehensive MRSA colonization surveillance program. It is not intended to diagnose MRSA infection nor to guide or monitor treatment for MRSA infections.          Radiology Studies: No results found.      Scheduled Meds: . amLODipine  2.5 mg Oral Daily  . aspirin  81 mg Oral Daily  . enoxaparin (LOVENOX) injection  40 mg Subcutaneous Q24H  . escitalopram  20 mg Oral Daily  . famotidine  20 mg Oral BID  . furosemide  40 mg Oral BID  . ipratropium-albuterol  3 mL Nebulization TID  . linaclotide  145 mcg Oral QAC breakfast  . mouth rinse  15 mL Mouth Rinse BID  . methylPREDNISolone (SOLU-MEDROL) injection  40 mg Intravenous  Daily  . metoprolol  50 mg Oral BID  . multivitamin with minerals  1 tablet Oral Daily  . ondansetron (ZOFRAN) IV  4 mg Intravenous Once  . sodium chloride flush  3 mL Intravenous Q12H   Continuous Infusions:   LOS: 9 days       Derrell Milanes Gerome Apley, MD [facility] Pager [phone]  If 7PM-7AM, please contact night-coverage [email or web address] Password [email or web address] 02/03/2017, 8:01 AM

## 2017-02-03 NOTE — Progress Notes (Signed)
Late entry. Patient discharged home with family at 1. Escorted out via wheelchair with O2 and RN escort. NSL discontinued with catheter intact. Discharge instructions reviewed with patient and family. Discussed disease process, medications, home health, bipap, and follow up medical appts. DNR forms given to patient and family at discharge. Bartholomew Crews, RN

## 2017-02-03 NOTE — Progress Notes (Signed)
Results for MATEAH, MULGREW (MRN [id number]) as of 02/03/2017 05:05  Ref. Range 02/03/2017 04:10  Sample type Unknown ARTERIAL DRAW  Delivery systems Unknown BILEVEL POSITIVE .Marland Kitchen.  O2 Content Latest Units: L/min 2.0  Inspiratory PAP Unknown 20  Expiratory PAP Unknown 4  pH, Arterial Latest Ref Range: 7.350 - 7.450  7.389  pCO2 arterial Latest Ref Range: 32.0 - 48.0 mmHg 83.5 (HH)  pO2, Arterial Latest Ref Range: 83.0 - 108.0 mmHg 38.2 (LL)  Acid-Base Excess Latest Ref Range: 0.0 - 2.0 mmol/L 22.8 (H)  Bicarbonate Latest Ref Range: 20.0 - 28.0 mmol/L 49.3 (H)  O2 Saturation Latest Units: % 67.8  Patient temperature Unknown 98.6  Collection site Unknown LEFT RADIAL  Allens test (pass/fail) Latest Ref Range: PASS  PASS  Calling results to MD

## 2017-02-04 ENCOUNTER — Telehealth: Payer: Self-pay | Admitting: Pulmonary Disease

## 2017-02-04 NOTE — Telephone Encounter (Signed)
   Son calls to inform me that whenever his mother uses the Bipap with 2L O2, her o2 saturation drops in the 70s-80s.   Patient was admitted for 10 days for acute on chronic hypoxemic hypercapnic respiratory failure. She  improved with noninvasive ventilator use at the hospital. She was just discharged on March 2.  Patient has had her on BiPAP machine for several years now. The last couple of months, BiPAP machine seems to be malfunctioning.  Whenever patient puts it on, with oxygen, her oxygen level drops to the 70s and 80s.  They mentioned it to the DME.  Bipap settings were changed from 20/5 cm water to 20/6  water recently but patient's oxygen level still drops to the mid 70s and 80s whenever she uses it. Patient feels that not enough air is coming out. She felt different when she used the hospital' s BiPAP machine, I.e. The hospital's machine was working well and her o2 sats were stable.   Plan : 1. I instructed the son to call [employer] on Monday to let them know that he will bring his mother and her BiPAP machine to their office on Monday to have it checked. Most likely, she will need a new BiPAP machine as the current one is malfunctional. If they have issues with [employer], son will want a new DME company.  2. In the meantime, I instructed the son not to use the BiPAP machine. Patient can sleep with her oxygen 2-3 liters, via nasal cannula, as long as O2 sats ration is more than 88%. If he's concerned about her not having BiPAP for the next couple of nights, I told the son she can bring her mother to the emergency room and he can explain the situation.  3. Jasmine >> can we figure out from [place] if we can get a new bipap for this pt?  Will she need a new study or can she just get a bipap w/o one?  Also, son was wondering if they can change DMEs?   Thanks.   Monica Becton, MD 02/04/2017, 5:21 AM [facility] Pager [phone] After 3 pm or if no answer, call  [phone]

## 2017-02-04 NOTE — Telephone Encounter (Signed)
Son [name] called back - saying that instruction book in back saying that patient is on CPAP seeting on a bipap machine and if this is the issue that is brining pusle ox down.  Instricuted son that I am forwarding this to Dr Larkin Ina - son wanted Dr Larkin Ina to know this addef info  Dr. Brand Males, M.D., [facility].C.P Pulmonary and Critical Care Medicine Staff Physician [place] Pager: [phone], If no answer or between  15:00h - 7:00h: call [phone]  02/04/2017 11:09 AM

## 2017-02-05 NOTE — Telephone Encounter (Signed)
   pls tell pts son to bring the cpap/bipap machine to dme as we discussed over the weekend.  Thanks for the update.   Monica Becton, MD 02/05/2017, 1:25 PM [place] [facility] Pager [phone] After 3 pm or if no answer, call [phone]

## 2017-02-05 NOTE — Telephone Encounter (Signed)
Will close this message. Another message was placed about pts bipap. Nothing further is needed

## 2017-02-05 NOTE — Telephone Encounter (Signed)
AD   I spoke with Amanda Horton, and they stated pt was on a auto setting but has been changed as of yesterday to bipap settings of 20/6. Spoke with Amanda Horton (son) who stated that he monitored his mom's oxygen last night on the setting change and he said her oxygen stayed in the range of 88%-94%. He feels like she did well last night. As of right now does not want to change DMEs.

## 2017-02-06 DIAGNOSIS — J441 Chronic obstructive pulmonary disease with (acute) exacerbation: Secondary | ICD-10-CM | POA: Diagnosis not present

## 2017-02-06 DIAGNOSIS — I130 Hypertensive heart and chronic kidney disease with heart failure and stage 1 through stage 4 chronic kidney disease, or unspecified chronic kidney disease: Secondary | ICD-10-CM | POA: Diagnosis not present

## 2017-02-06 DIAGNOSIS — I5033 Acute on chronic diastolic (congestive) heart failure: Secondary | ICD-10-CM | POA: Diagnosis not present

## 2017-02-06 DIAGNOSIS — L989 Disorder of the skin and subcutaneous tissue, unspecified: Secondary | ICD-10-CM | POA: Diagnosis not present

## 2017-02-06 DIAGNOSIS — N183 Chronic kidney disease, stage 3 (moderate): Secondary | ICD-10-CM | POA: Diagnosis not present

## 2017-02-06 DIAGNOSIS — D631 Anemia in chronic kidney disease: Secondary | ICD-10-CM | POA: Diagnosis not present

## 2017-02-06 NOTE — Telephone Encounter (Signed)
Spoke to Son and [place] and informed them of his message. They understood and had no further questions, Nothing further is needed at this time

## 2017-02-07 ENCOUNTER — Encounter: Payer: Self-pay | Admitting: Pulmonary Disease

## 2017-02-07 DIAGNOSIS — J441 Chronic obstructive pulmonary disease with (acute) exacerbation: Secondary | ICD-10-CM | POA: Diagnosis not present

## 2017-02-07 DIAGNOSIS — I130 Hypertensive heart and chronic kidney disease with heart failure and stage 1 through stage 4 chronic kidney disease, or unspecified chronic kidney disease: Secondary | ICD-10-CM | POA: Diagnosis not present

## 2017-02-07 DIAGNOSIS — N183 Chronic kidney disease, stage 3 (moderate): Secondary | ICD-10-CM | POA: Diagnosis not present

## 2017-02-07 DIAGNOSIS — G4733 Obstructive sleep apnea (adult) (pediatric): Secondary | ICD-10-CM | POA: Diagnosis not present

## 2017-02-07 DIAGNOSIS — D631 Anemia in chronic kidney disease: Secondary | ICD-10-CM | POA: Diagnosis not present

## 2017-02-07 DIAGNOSIS — L989 Disorder of the skin and subcutaneous tissue, unspecified: Secondary | ICD-10-CM | POA: Diagnosis not present

## 2017-02-07 DIAGNOSIS — I5033 Acute on chronic diastolic (congestive) heart failure: Secondary | ICD-10-CM | POA: Diagnosis not present

## 2017-02-08 DIAGNOSIS — J441 Chronic obstructive pulmonary disease with (acute) exacerbation: Secondary | ICD-10-CM | POA: Diagnosis not present

## 2017-02-08 DIAGNOSIS — L989 Disorder of the skin and subcutaneous tissue, unspecified: Secondary | ICD-10-CM | POA: Diagnosis not present

## 2017-02-08 DIAGNOSIS — I130 Hypertensive heart and chronic kidney disease with heart failure and stage 1 through stage 4 chronic kidney disease, or unspecified chronic kidney disease: Secondary | ICD-10-CM | POA: Diagnosis not present

## 2017-02-08 DIAGNOSIS — G4733 Obstructive sleep apnea (adult) (pediatric): Secondary | ICD-10-CM | POA: Diagnosis not present

## 2017-02-08 DIAGNOSIS — N183 Chronic kidney disease, stage 3 (moderate): Secondary | ICD-10-CM | POA: Diagnosis not present

## 2017-02-08 DIAGNOSIS — I5033 Acute on chronic diastolic (congestive) heart failure: Secondary | ICD-10-CM | POA: Diagnosis not present

## 2017-02-08 DIAGNOSIS — D631 Anemia in chronic kidney disease: Secondary | ICD-10-CM | POA: Diagnosis not present

## 2017-02-09 DIAGNOSIS — L989 Disorder of the skin and subcutaneous tissue, unspecified: Secondary | ICD-10-CM | POA: Diagnosis not present

## 2017-02-09 DIAGNOSIS — I5033 Acute on chronic diastolic (congestive) heart failure: Secondary | ICD-10-CM | POA: Diagnosis not present

## 2017-02-09 DIAGNOSIS — Z9181 History of falling: Secondary | ICD-10-CM | POA: Diagnosis not present

## 2017-02-09 DIAGNOSIS — I129 Hypertensive chronic kidney disease with stage 1 through stage 4 chronic kidney disease, or unspecified chronic kidney disease: Secondary | ICD-10-CM | POA: Diagnosis not present

## 2017-02-09 DIAGNOSIS — Z6841 Body Mass Index (BMI) 40.0 and over, adult: Secondary | ICD-10-CM | POA: Diagnosis not present

## 2017-02-09 DIAGNOSIS — D638 Anemia in other chronic diseases classified elsewhere: Secondary | ICD-10-CM | POA: Diagnosis not present

## 2017-02-09 DIAGNOSIS — I130 Hypertensive heart and chronic kidney disease with heart failure and stage 1 through stage 4 chronic kidney disease, or unspecified chronic kidney disease: Secondary | ICD-10-CM | POA: Diagnosis not present

## 2017-02-09 DIAGNOSIS — J9621 Acute and chronic respiratory failure with hypoxia: Secondary | ICD-10-CM | POA: Diagnosis not present

## 2017-02-09 DIAGNOSIS — N183 Chronic kidney disease, stage 3 (moderate): Secondary | ICD-10-CM | POA: Diagnosis not present

## 2017-02-09 DIAGNOSIS — D631 Anemia in chronic kidney disease: Secondary | ICD-10-CM | POA: Diagnosis not present

## 2017-02-09 DIAGNOSIS — J441 Chronic obstructive pulmonary disease with (acute) exacerbation: Secondary | ICD-10-CM | POA: Diagnosis not present

## 2017-02-13 DIAGNOSIS — N183 Chronic kidney disease, stage 3 (moderate): Secondary | ICD-10-CM | POA: Diagnosis not present

## 2017-02-13 DIAGNOSIS — I5033 Acute on chronic diastolic (congestive) heart failure: Secondary | ICD-10-CM | POA: Diagnosis not present

## 2017-02-13 DIAGNOSIS — L989 Disorder of the skin and subcutaneous tissue, unspecified: Secondary | ICD-10-CM | POA: Diagnosis not present

## 2017-02-13 DIAGNOSIS — I130 Hypertensive heart and chronic kidney disease with heart failure and stage 1 through stage 4 chronic kidney disease, or unspecified chronic kidney disease: Secondary | ICD-10-CM | POA: Diagnosis not present

## 2017-02-13 DIAGNOSIS — D631 Anemia in chronic kidney disease: Secondary | ICD-10-CM | POA: Diagnosis not present

## 2017-02-13 DIAGNOSIS — J441 Chronic obstructive pulmonary disease with (acute) exacerbation: Secondary | ICD-10-CM | POA: Diagnosis not present

## 2017-02-14 DIAGNOSIS — L989 Disorder of the skin and subcutaneous tissue, unspecified: Secondary | ICD-10-CM | POA: Diagnosis not present

## 2017-02-14 DIAGNOSIS — I5033 Acute on chronic diastolic (congestive) heart failure: Secondary | ICD-10-CM | POA: Diagnosis not present

## 2017-02-14 DIAGNOSIS — D631 Anemia in chronic kidney disease: Secondary | ICD-10-CM | POA: Diagnosis not present

## 2017-02-14 DIAGNOSIS — N183 Chronic kidney disease, stage 3 (moderate): Secondary | ICD-10-CM | POA: Diagnosis not present

## 2017-02-14 DIAGNOSIS — I130 Hypertensive heart and chronic kidney disease with heart failure and stage 1 through stage 4 chronic kidney disease, or unspecified chronic kidney disease: Secondary | ICD-10-CM | POA: Diagnosis not present

## 2017-02-14 DIAGNOSIS — J441 Chronic obstructive pulmonary disease with (acute) exacerbation: Secondary | ICD-10-CM | POA: Diagnosis not present

## 2017-02-15 DIAGNOSIS — L989 Disorder of the skin and subcutaneous tissue, unspecified: Secondary | ICD-10-CM | POA: Diagnosis not present

## 2017-02-15 DIAGNOSIS — J441 Chronic obstructive pulmonary disease with (acute) exacerbation: Secondary | ICD-10-CM | POA: Diagnosis not present

## 2017-02-15 DIAGNOSIS — G4733 Obstructive sleep apnea (adult) (pediatric): Secondary | ICD-10-CM | POA: Diagnosis not present

## 2017-02-15 DIAGNOSIS — I5033 Acute on chronic diastolic (congestive) heart failure: Secondary | ICD-10-CM | POA: Diagnosis not present

## 2017-02-15 DIAGNOSIS — I130 Hypertensive heart and chronic kidney disease with heart failure and stage 1 through stage 4 chronic kidney disease, or unspecified chronic kidney disease: Secondary | ICD-10-CM | POA: Diagnosis not present

## 2017-02-15 DIAGNOSIS — D631 Anemia in chronic kidney disease: Secondary | ICD-10-CM | POA: Diagnosis not present

## 2017-02-15 DIAGNOSIS — N183 Chronic kidney disease, stage 3 (moderate): Secondary | ICD-10-CM | POA: Diagnosis not present

## 2017-02-16 DIAGNOSIS — I5033 Acute on chronic diastolic (congestive) heart failure: Secondary | ICD-10-CM | POA: Diagnosis not present

## 2017-02-16 DIAGNOSIS — I130 Hypertensive heart and chronic kidney disease with heart failure and stage 1 through stage 4 chronic kidney disease, or unspecified chronic kidney disease: Secondary | ICD-10-CM | POA: Diagnosis not present

## 2017-02-16 DIAGNOSIS — M899 Disorder of bone, unspecified: Secondary | ICD-10-CM | POA: Diagnosis not present

## 2017-02-16 DIAGNOSIS — L989 Disorder of the skin and subcutaneous tissue, unspecified: Secondary | ICD-10-CM | POA: Diagnosis not present

## 2017-02-16 DIAGNOSIS — J441 Chronic obstructive pulmonary disease with (acute) exacerbation: Secondary | ICD-10-CM | POA: Diagnosis not present

## 2017-02-16 DIAGNOSIS — R0902 Hypoxemia: Secondary | ICD-10-CM | POA: Diagnosis not present

## 2017-02-16 DIAGNOSIS — D631 Anemia in chronic kidney disease: Secondary | ICD-10-CM | POA: Diagnosis not present

## 2017-02-16 DIAGNOSIS — M47817 Spondylosis without myelopathy or radiculopathy, lumbosacral region: Secondary | ICD-10-CM | POA: Diagnosis not present

## 2017-02-16 DIAGNOSIS — N183 Chronic kidney disease, stage 3 (moderate): Secondary | ICD-10-CM | POA: Diagnosis not present

## 2017-02-16 DIAGNOSIS — J449 Chronic obstructive pulmonary disease, unspecified: Secondary | ICD-10-CM | POA: Diagnosis not present

## 2017-02-18 ENCOUNTER — Encounter: Payer: Self-pay | Admitting: Pulmonary Disease

## 2017-02-19 DIAGNOSIS — S81801D Unspecified open wound, right lower leg, subsequent encounter: Secondary | ICD-10-CM | POA: Diagnosis not present

## 2017-02-20 ENCOUNTER — Ambulatory Visit (INDEPENDENT_AMBULATORY_CARE_PROVIDER_SITE_OTHER): Payer: Medicare HMO | Admitting: Pulmonary Disease

## 2017-02-20 ENCOUNTER — Encounter: Payer: Self-pay | Admitting: Pulmonary Disease

## 2017-02-20 VITALS — BP 110/56 | HR 67 | Ht 62.0 in | Wt 243.0 lb

## 2017-02-20 DIAGNOSIS — I5033 Acute on chronic diastolic (congestive) heart failure: Secondary | ICD-10-CM | POA: Diagnosis not present

## 2017-02-20 DIAGNOSIS — E662 Morbid (severe) obesity with alveolar hypoventilation: Secondary | ICD-10-CM | POA: Diagnosis not present

## 2017-02-20 DIAGNOSIS — J441 Chronic obstructive pulmonary disease with (acute) exacerbation: Secondary | ICD-10-CM | POA: Diagnosis not present

## 2017-02-20 DIAGNOSIS — G4733 Obstructive sleep apnea (adult) (pediatric): Secondary | ICD-10-CM

## 2017-02-20 DIAGNOSIS — I5042 Chronic combined systolic (congestive) and diastolic (congestive) heart failure: Secondary | ICD-10-CM | POA: Diagnosis not present

## 2017-02-20 DIAGNOSIS — J449 Chronic obstructive pulmonary disease, unspecified: Secondary | ICD-10-CM | POA: Insufficient documentation

## 2017-02-20 DIAGNOSIS — J9621 Acute and chronic respiratory failure with hypoxia: Secondary | ICD-10-CM | POA: Diagnosis not present

## 2017-02-20 DIAGNOSIS — N183 Chronic kidney disease, stage 3 (moderate): Secondary | ICD-10-CM | POA: Diagnosis not present

## 2017-02-20 DIAGNOSIS — D631 Anemia in chronic kidney disease: Secondary | ICD-10-CM | POA: Diagnosis not present

## 2017-02-20 DIAGNOSIS — J439 Emphysema, unspecified: Secondary | ICD-10-CM | POA: Diagnosis not present

## 2017-02-20 DIAGNOSIS — I130 Hypertensive heart and chronic kidney disease with heart failure and stage 1 through stage 4 chronic kidney disease, or unspecified chronic kidney disease: Secondary | ICD-10-CM | POA: Diagnosis not present

## 2017-02-20 DIAGNOSIS — L989 Disorder of the skin and subcutaneous tissue, unspecified: Secondary | ICD-10-CM | POA: Diagnosis not present

## 2017-02-20 MED ORDER — FLUTICASONE-UMECLIDIN-VILANT 100-62.5-25 MCG/INH IN AEPB: 1.0000 | INHALATION_SPRAY | Freq: Every day | RESPIRATORY_TRACT | 0 refills | Status: DC

## 2017-02-20 NOTE — Progress Notes (Signed)
Subjective:    Patient ID: Amanda Horton, female    DOB: 1946/09/15, 71 y.o.   MRN: [id number]  HPI  ROV 02/20/17 {t returns to the office as follow up.  Pt has a 32 PY smoking history, quit in 2014.  Pt was diagnosed in 2008 with COPD. She  is using advair 500/50 1 puff 2x/day, Spiriva capsule daily, duoneb 4x/day.  Pt has been on O2 since she was 71 yrs old. She has been on 2L O2 24/7, rest and walking. She has a POC and O2 tanks. [employer] is her DME.  Patient has OSA, unsure when the diagnosis was made. She is currently on a Bipap set at 20/6 cm water.  Machine is 3-4 yrs old. Has o2 2-3 L bled into machine. Last sleep study was in 2013.   PFT (03/2014), done at [facility],  FEV1/FVC  77%,  FEV1  0.58  26%. No FLOOP seen.  CXR (01/31/17)  RVD, Cardiomegaly  Patient follows up in the office for her chronic hypoxemic respiratory failure. She was recently admitted for a couple of times for acute and chronic respiratory failure (Jan, February, March). Last admission was start of March 2018.  She was treated with diuretics + prednisone + Antibiotics.   Pt has a BiPAP machine which was reset at 20/6cm water. She had some issues with her BiPAP machine for which the son ended up touching base with me and her DME company. With her recent discharge last February, when she used her BiPAP machine, her oxygen level was dropping despite 2 L oxygen. There was not enough pressure coming off from the BiPAP machine. They called [employer] and they were able to "fix the machine" by adjusting the pressure. The Bipap worked for less than a month but the last 3 days or so, machine is not delivering enough pressure and is malfunctioning again.  Pt's O2 sats are starting to drop again while on 2L O2 and her current bipap machine.   Download the last month: 93%, AHI 2.6.   Review of Systems  Constitutional: Negative.   HENT: Negative.   Eyes: Negative.   Respiratory: Positive for cough and shortness of breath.     Cardiovascular: Positive for leg swelling.  Gastrointestinal: Negative.   Endocrine: Negative.   Genitourinary: Negative.   Musculoskeletal: Negative.   Skin: Negative.   Allergic/Immunologic: Negative.   Neurological: Negative.   Hematological: Negative.   Psychiatric/Behavioral: Negative.   All other systems reviewed and are negative.  Past Medical History:  Diagnosis Date  . Acute hyperkalemia   . Acute hypokalemia   . Acute on chronic renal failure ([place])   . Allergic rhinitis   . Anemia due to chronic kidney disease   . APC (atrial premature contractions)   . 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 Benign essential hypertension   . Borderline diabetes   . Chronic insomnia   . Chronic venous insufficiency   . COPD (chronic obstructive pulmonary disease) ([place])   . GERD (gastroesophageal reflux disease)   . Hyperlipemia   . Hypoxia   . Lumbar disc narrowing   . On home oxygen therapy   . OSA (obstructive sleep apnea)   . Osteoporosis   . Respiratory failure requiring intubation ([place])   . Spinal stenosis of lumbar region without neurogenic claudication   . Varicose veins with pain   . Vitamin B12 deficiency   . Vitamin D deficiency      Family History  Problem Relation Age  of Onset  . Emphysema Father   . Asthma Father   . Throat cancer Mother      Past Surgical History:  Procedure Laterality Date  . APPLICATION OF A-CELL OF EXTREMITY Right 08/30/2016   Procedure: APPLICATION OF A-CELL OF EXTREMITY;  Surgeon: Loel Lofty Dillingham, DO;  Location: [place];  Service: Plastics;  Laterality: Right;  . APPLICATION OF A-CELL OF EXTREMITY Right 09/20/2016   Procedure: APPLICATION OF A-CELL OF right lower leg wound;  Surgeon: Loel Lofty Dillingham, DO;  Location: [place];  Service: Plastics;  Laterality: Right;  . APPLICATION OF WOUND VAC Right 08/30/2016   Procedure: APPLICATION OF WOUND VAC;  Surgeon: Loel Lofty Dillingham, DO;  Location: [place];  Service: Plastics;  Laterality:  Right;  . APPLICATION OF WOUND VAC Right 09/20/2016   Procedure: APPLICATION OF WOUND VAC right lower leg wound;  Surgeon: Loel Lofty Dillingham, DO;  Location: [place];  Service: Plastics;  Laterality: Right;  . I&D EXTREMITY Right 08/30/2016   Procedure: IRRIGATION AND DEBRIDEMENT EXTREMITY RIGHT;  Surgeon: Loel Lofty Dillingham, DO;  Location: [place];  Service: Plastics;  Laterality: Right;  . INCISION AND DRAINAGE OF WOUND Right 09/20/2016   Procedure: IRRIGATION AND DEBRIDEMENT right lower leg wound;  Surgeon: Loel Lofty Dillingham, DO;  Location: [place];  Service: Plastics;  Laterality: Right;  . PARTIAL HYSTERECTOMY      Social History   Social History  . Marital status: Single    Spouse name: N/A  . Number of children: N/A  . Years of education: N/A   Occupational History  . Retired     [employer]   Social History Main Topics  . Smoking status: Former Smoker    Packs/day: 1.00    Years: 55.00    Types: Cigarettes    Quit date: 12/04/2012  . Smokeless tobacco: Never Used     Comment: Stopped in 2014  . Alcohol use No  . Drug use: No  . Sexual activity: Not on file   Other Topics Concern  . Not on file   Social History Narrative  . No narrative on file     Allergies  Allergen Reactions  . Sulfa Antibiotics Nausea And Vomiting and Other (See Comments)  . Tape Other (See Comments)    SKIN IS VERY THIN AND WILL BRUISE & TEAR EASILY!!     Outpatient Medications Prior to Visit  Medication Sig Dispense Refill  . albuterol (PROVENTIL HFA;VENTOLIN HFA) 108 (90 BASE) MCG/ACT inhaler Inhale 2 puffs into the lungs every 6 (six) hours as needed for wheezing or shortness of breath.    Marland Kitchen albuterol (PROVENTIL) (2.5 MG/3ML) 0.083% nebulizer solution Take 3 mLs (2.5 mg total) by nebulization every 4 (four) hours as needed for wheezing or shortness of breath. 75 mL 0  . alendronate (FOSAMAX) 70 MG tablet Take 70 mg by mouth every Saturday. Take with a full glass of water on an empty  stomach.     Marland Kitchen amLODipine (NORVASC) 2.5 MG tablet Take 1 tablet (2.5 mg total) by mouth daily. 30 tablet 1  . aspirin 81 MG tablet Take 81 mg by mouth daily.    Marland Kitchen escitalopram (LEXAPRO) 20 MG tablet Take 20 mg by mouth daily.     . furosemide (LASIX) 20 MG tablet Take 20 mg by mouth daily.     Marland Kitchen guaifenesin (HUMIBID E) 400 MG TABS tablet Take 800 mg by mouth 2 (two) times daily.    Marland Kitchen linaclotide (LINZESS) 145  MCG CAPS capsule Take 145 mcg by mouth daily.     . metoprolol (LOPRESSOR) 50 MG tablet Take 1 tablet (50 mg total) by mouth 2 (two) times daily. 60 tablet 1  . omeprazole (PRILOSEC) 40 MG capsule Take 40 mg by mouth daily.     . ondansetron (ZOFRAN) 4 MG tablet Take 1 tablet (4 mg total) by mouth every 6 (six) hours as needed for nausea. 20 tablet 0  . oxyCODONE-acetaminophen (PERCOCET/ROXICET) 5-325 MG tablet Take 1 tablet by mouth every 8 (eight) hours as needed for moderate pain. 10 tablet 0  . ranitidine (ZANTAC) 150 MG tablet Take 1 tablet by mouth 2 (two) times daily.    . Vitamin D, Ergocalciferol, (DRISDOL) 50000 UNITS CAPS capsule Take 50,000 Units by mouth every Saturday.     . fluticasone furoate-vilanterol (BREO ELLIPTA) 200-25 MCG/INH AEPB Inhale 1 puff into the lungs daily.    . Tiotropium Bromide Monohydrate 2.5 MCG/ACT AERS Inhale 2 puffs into the lungs daily. (Patient not taking: Reported on 02/20/2017) 1 Inhaler 1   No facility-administered medications prior to visit.    Meds ordered this encounter  Medications  . Fluticasone-Salmeterol (ADVAIR) 500-50 MCG/DOSE AEPB    Sig: Inhale 1 puff into the lungs 2 (two) times daily.  . Fluticasone-Umeclidin-Vilant (TRELEGY ELLIPTA) 100-62.5-25 MCG/INH AEPB    Sig: Inhale 1 puff into the lungs daily.    Dispense:  14 each    Refill:  0    Order Specific Question:   Lot Number?    Answer:   [id number]    Order Specific Question:   Expiration Date?    Answer:   06/03/2018    Order Specific Question:   Quantity    Answer:   1          Objective:   Physical Exam Vitals:  Vitals:   02/20/17 1438  BP: (!) 110/56  Pulse: 67  Weight: 243 lb (110.2 kg)  Height: 5\' 2"  (1.575 m)    Constitutional/General:  Morbidly obese, not in any distress,  Comfortably seating on her wheelchair.  Well kempt  Body mass index is 44.45 kg/m. Wt Readings from Last 3 Encounters:  02/20/17 243 lb (110.2 kg)  02/02/17 242 lb 8.1 oz (110 kg)  01/09/17 248 lb (112.5 kg)     HEENT: Pupils equal and reactive to light and accommodation. Anicteric sclerae. Normal nasal mucosa.   No oral  lesions,  mouth clear,  oropharynx clear, no postnasal drip. (-) Oral thrush. No dental caries.  Airway - Mallampati class III-IV  Neck: No masses. Midline trachea. No JVD, (-) LAD. (-) bruits appreciated.  Respiratory/Chest: Grossly normal chest. (-) deformity. (-) Accessory muscle use.  Symmetric expansion. (-) Tenderness on palpation.  Resonant on percussion.  Diminished BS on both lower lung zones. (-) wheezing, rhonchi Crackles at bases.  (-) egophony  Cardiovascular: Regular rate and  rhythm, heart sounds normal, no murmur or gallops,Gr 2  peripheral edema  Gastrointestinal:  Normal bowel sounds. Soft, non-tender. No hepatosplenomegaly.  (-) masses.   Musculoskeletal:  Normal muscle tone. Unable to examine gain as she was sitting on her wheelchair  Extremities: Grossly normal. (-) clubbing, cyanosis.  Gr 2 edema  Skin: (-) rash,lesions seen.   Neurological/Psychiatric : alert, oriented to time, place, person. Normal mood and affect           Assessment & Plan:  Acute on chronic respiratory failure with hypoxia (HCC) Pt with chronic hypoxemic hypercapneic resp failure 2/2 severe  COPD + OSA + OHS + Morbid obesity + CHFpEF  Recent admission for several times start of 2018 2/2 above.  Main issue I think is her severe copd and volume overload.   Cont Bipap 20/6 cm water with 2 L O2.  Cont O2 2L 24/7 and with  exertion.   ONO (02/2017) on bipap and 2 L >> no sig desatn.   Bipap seems malfunctional again per son's account.  Not delivering enough pressure again. Son will contact [place].  We will contact [place].  Machine has been malfxnal recently. Pt likely will need a new bipap machine.  She may end up needing a new sleep study to get a new machine.   Pt tried NIV with Trilogy and did NOT tolerate it. If with repeated admissions later on, will try Astral NIV.    OSA (obstructive sleep apnea) Cont Bipap 20/6 with 2 L o2.  Machine is malfunctional recently (again).  Son will contact DME. We will try to get a new BiPaP as well.   COPD (chronic obstructive pulmonary disease) (HCC) Recent AECOPD. Off prednisone x 1 month.  On advair 250/50 1P BID and Spiriva capsule and duoneb 4x/day. Will try to switch advair and spiriva to trelegy. Will give sample and coupon.  Cont Duoneb qid for now.  Prn albuterol.  Need to ask re: vaccines on f/u.   Obesity hypoventilation syndrome (HCC) Cont BiPaP 20/6 cm water + 2 L o2.  Did not tolerate Trilogy. Will consider Astral on f/u. Still with hypercapnea  Chronic combined systolic and diastolic CHF (congestive heart failure) (HCC) Cont diuretics.  Follow up with Cardiology  Morbid obesity ([place]) Weight reduction encouraged.      Monica Becton, MD 02/20/2017, 10:54 PM [place] [facility] Pager [phone] After 3 pm or if no answer, call [phone]

## 2017-02-20 NOTE — Assessment & Plan Note (Signed)
Cont diuretics.  Follow up with Cardiology

## 2017-02-20 NOTE — Assessment & Plan Note (Signed)
Cont BiPaP 20/6 cm water + 2 L o2.  Did not tolerate Trilogy. Will consider Astral on f/u. Still with hypercapnea

## 2017-02-20 NOTE — Progress Notes (Signed)
Pt was shown proper inhaler technique. She understood and had no further questions.

## 2017-02-20 NOTE — Patient Instructions (Signed)
  It was a pleasure taking care of you today!  Continue using your BiPAP machine. You are  currently on Bipap 20/6 cm water with 2 L oxygen. We will call your DME company as your machine is malfunctioning. We will try to get a new machine.  Please make sure you use your BiPAP device everytime you sleep.  We will monitor the usage of your machine per your insurance requirement.  Your insurance company may take the machine from you if you are not using it regularly.   Please clean the mask, tubings, filter, water reservoir with soapy water every week.  Please use distilled water for the water reservoir.   Please call the office or your machine provider (DME company) if you are having issues with the device.   We will try you on Trelegy, 1 puff daily.  Stop advair and spiriva while using Trelegy.  Continue Duoneb 4x/day.   Return to clinic in 6 weeks  with Dr. Corrie Dandy or NP/APP

## 2017-02-20 NOTE — Assessment & Plan Note (Signed)
Weight reduction encouraged.

## 2017-02-20 NOTE — Assessment & Plan Note (Signed)
Recent AECOPD. Off prednisone x 1 month.  On advair 250/50 1P BID and Spiriva capsule and duoneb 4x/day. Will try to switch advair and spiriva to trelegy. Will give sample and coupon.  Cont Duoneb qid for now.  Prn albuterol.  Need to ask re: vaccines on f/u.

## 2017-02-20 NOTE — Assessment & Plan Note (Signed)
Pt with chronic hypoxemic hypercapneic resp failure 2/2 severe COPD + OSA + OHS + Morbid obesity + CHFpEF  Recent admission for several times start of 2018 2/2 above.  Main issue I think is her severe copd and volume overload.   Cont Bipap 20/6 cm water with 2 L O2.  Cont O2 2L 24/7 and with exertion.   ONO (02/2017) on bipap and 2 L >> no sig desatn.   Bipap seems malfunctional again per son's account.  Not delivering enough pressure again. Son will contact [facility].  We will contact [place].  Machine has been malfxnal recently. Pt likely will need a new bipap machine.  She may end up needing a new sleep study to get a new machine.   Pt tried NIV with Trilogy and did NOT tolerate it. If with repeated admissions later on, will try Astral NIV.

## 2017-02-20 NOTE — Assessment & Plan Note (Signed)
Cont Bipap 20/6 with 2 L o2.  Machine is malfunctional recently (again).  Son will contact DME. We will try to get a new BiPaP as well.

## 2017-02-21 ENCOUNTER — Telehealth: Payer: Self-pay | Admitting: Pulmonary Disease

## 2017-02-21 DIAGNOSIS — J441 Chronic obstructive pulmonary disease with (acute) exacerbation: Secondary | ICD-10-CM | POA: Diagnosis not present

## 2017-02-21 DIAGNOSIS — L989 Disorder of the skin and subcutaneous tissue, unspecified: Secondary | ICD-10-CM | POA: Diagnosis not present

## 2017-02-21 DIAGNOSIS — I130 Hypertensive heart and chronic kidney disease with heart failure and stage 1 through stage 4 chronic kidney disease, or unspecified chronic kidney disease: Secondary | ICD-10-CM | POA: Diagnosis not present

## 2017-02-21 DIAGNOSIS — N183 Chronic kidney disease, stage 3 (moderate): Secondary | ICD-10-CM | POA: Diagnosis not present

## 2017-02-21 DIAGNOSIS — D631 Anemia in chronic kidney disease: Secondary | ICD-10-CM | POA: Diagnosis not present

## 2017-02-21 DIAGNOSIS — I5033 Acute on chronic diastolic (congestive) heart failure: Secondary | ICD-10-CM | POA: Diagnosis not present

## 2017-02-21 NOTE — Telephone Encounter (Signed)
I have attempted to contact [facility] to check on the status of the order for this pt.  Family stated that they have not heard from them about the set up of the machine.  PCC's please advise thanks

## 2017-02-21 NOTE — Telephone Encounter (Signed)
Spoke with patient-aware that order has been re-faxed to [place] and they are working on it now. Pt will wait for a call from [place] for set up.

## 2017-02-21 NOTE — Telephone Encounter (Signed)
I spoke to [email or web address]  she said she did not receive the order so I printed and refaxed it Amanda Horton

## 2017-02-23 DIAGNOSIS — M6281 Muscle weakness (generalized): Secondary | ICD-10-CM | POA: Diagnosis not present

## 2017-02-23 DIAGNOSIS — Z9181 History of falling: Secondary | ICD-10-CM | POA: Diagnosis not present

## 2017-02-23 DIAGNOSIS — I130 Hypertensive heart and chronic kidney disease with heart failure and stage 1 through stage 4 chronic kidney disease, or unspecified chronic kidney disease: Secondary | ICD-10-CM | POA: Diagnosis not present

## 2017-02-23 DIAGNOSIS — J9601 Acute respiratory failure with hypoxia: Secondary | ICD-10-CM | POA: Diagnosis not present

## 2017-02-23 DIAGNOSIS — D631 Anemia in chronic kidney disease: Secondary | ICD-10-CM | POA: Diagnosis not present

## 2017-02-23 DIAGNOSIS — N183 Chronic kidney disease, stage 3 (moderate): Secondary | ICD-10-CM | POA: Diagnosis not present

## 2017-02-23 DIAGNOSIS — I5042 Chronic combined systolic (congestive) and diastolic (congestive) heart failure: Secondary | ICD-10-CM | POA: Diagnosis not present

## 2017-02-23 DIAGNOSIS — J441 Chronic obstructive pulmonary disease with (acute) exacerbation: Secondary | ICD-10-CM | POA: Diagnosis not present

## 2017-02-23 DIAGNOSIS — I5033 Acute on chronic diastolic (congestive) heart failure: Secondary | ICD-10-CM | POA: Diagnosis not present

## 2017-02-23 DIAGNOSIS — L989 Disorder of the skin and subcutaneous tissue, unspecified: Secondary | ICD-10-CM | POA: Diagnosis not present

## 2017-02-26 ENCOUNTER — Telehealth: Payer: Self-pay | Admitting: Pulmonary Disease

## 2017-02-26 DIAGNOSIS — J441 Chronic obstructive pulmonary disease with (acute) exacerbation: Secondary | ICD-10-CM | POA: Diagnosis not present

## 2017-02-26 DIAGNOSIS — D631 Anemia in chronic kidney disease: Secondary | ICD-10-CM | POA: Diagnosis not present

## 2017-02-26 DIAGNOSIS — I5033 Acute on chronic diastolic (congestive) heart failure: Secondary | ICD-10-CM | POA: Diagnosis not present

## 2017-02-26 DIAGNOSIS — I130 Hypertensive heart and chronic kidney disease with heart failure and stage 1 through stage 4 chronic kidney disease, or unspecified chronic kidney disease: Secondary | ICD-10-CM | POA: Diagnosis not present

## 2017-02-26 DIAGNOSIS — N183 Chronic kidney disease, stage 3 (moderate): Secondary | ICD-10-CM | POA: Diagnosis not present

## 2017-02-26 DIAGNOSIS — L989 Disorder of the skin and subcutaneous tissue, unspecified: Secondary | ICD-10-CM | POA: Diagnosis not present

## 2017-02-26 MED ORDER — FLUTICASONE-UMECLIDIN-VILANT 100-62.5-25 MCG/INH IN AEPB: 1.0000 | INHALATION_SPRAY | Freq: Every day | RESPIRATORY_TRACT | 5 refills | Status: AC

## 2017-02-26 NOTE — Telephone Encounter (Signed)
Spoke with pt and verified pharmacy. Rx was sent, nothing further is needed at this time.

## 2017-02-27 ENCOUNTER — Telehealth: Payer: Self-pay | Admitting: Pulmonary Disease

## 2017-02-27 DIAGNOSIS — G4733 Obstructive sleep apnea (adult) (pediatric): Secondary | ICD-10-CM

## 2017-02-27 NOTE — Telephone Encounter (Signed)
Increase Bipap to 22/6 with 4 lt O2. Order overnight oximetry.  She will need follow up in sleep clinic. Looks like Dr. Corrie Dandy was considering the Astral vent for her.

## 2017-02-27 NOTE — Telephone Encounter (Signed)
Dr. Vaughan Browner  Please Advise in Dr Corrie Dandy absence-   Pt son called in concerned that pt oxygen levels are dropping in the 70s and 80s while she is sleeping with her BiPap machine and states the pt has a hx of CO2 build up.  Pt is currently on BiPap 20/6 with 2L of oxygen. He wanted to know if we should change her pressure.

## 2017-02-27 NOTE — Telephone Encounter (Signed)
Called and spoke with pt and she is aware that per PM we will change the setting of the BIPAP that she is on--order sent in to [facility].  Pt is aware that if she is not seeing improvement from the change, to give [place] a call back and we will get her in before her 5/1 appt.  Pt voiced her understanding.

## 2017-02-28 DIAGNOSIS — I5033 Acute on chronic diastolic (congestive) heart failure: Secondary | ICD-10-CM | POA: Diagnosis not present

## 2017-02-28 DIAGNOSIS — L989 Disorder of the skin and subcutaneous tissue, unspecified: Secondary | ICD-10-CM | POA: Diagnosis not present

## 2017-02-28 DIAGNOSIS — I130 Hypertensive heart and chronic kidney disease with heart failure and stage 1 through stage 4 chronic kidney disease, or unspecified chronic kidney disease: Secondary | ICD-10-CM | POA: Diagnosis not present

## 2017-02-28 DIAGNOSIS — D631 Anemia in chronic kidney disease: Secondary | ICD-10-CM | POA: Diagnosis not present

## 2017-02-28 DIAGNOSIS — N183 Chronic kidney disease, stage 3 (moderate): Secondary | ICD-10-CM | POA: Diagnosis not present

## 2017-02-28 DIAGNOSIS — J441 Chronic obstructive pulmonary disease with (acute) exacerbation: Secondary | ICD-10-CM | POA: Diagnosis not present

## 2017-03-01 ENCOUNTER — Telehealth: Payer: Self-pay | Admitting: Pulmonary Disease

## 2017-03-01 DIAGNOSIS — N183 Chronic kidney disease, stage 3 (moderate): Secondary | ICD-10-CM | POA: Diagnosis not present

## 2017-03-01 DIAGNOSIS — D631 Anemia in chronic kidney disease: Secondary | ICD-10-CM | POA: Diagnosis not present

## 2017-03-01 DIAGNOSIS — I130 Hypertensive heart and chronic kidney disease with heart failure and stage 1 through stage 4 chronic kidney disease, or unspecified chronic kidney disease: Secondary | ICD-10-CM | POA: Diagnosis not present

## 2017-03-01 DIAGNOSIS — L989 Disorder of the skin and subcutaneous tissue, unspecified: Secondary | ICD-10-CM | POA: Diagnosis not present

## 2017-03-01 DIAGNOSIS — I5033 Acute on chronic diastolic (congestive) heart failure: Secondary | ICD-10-CM | POA: Diagnosis not present

## 2017-03-01 DIAGNOSIS — J441 Chronic obstructive pulmonary disease with (acute) exacerbation: Secondary | ICD-10-CM | POA: Diagnosis not present

## 2017-03-01 NOTE — Telephone Encounter (Signed)
Spoke with Amanda Horton who wanted to verify what Dr. Vaughan Browner was ordering for her bipap as far as what her liters of oxygen should be. Informed him he had no further questions. Nothing further is needed.  PM recommendation from phone note 02/28/17  Increase Bipap to 22/6 with 4 lt O2. Order overnight oximetry.  She will need follow up in sleep clinic. Looks like Dr. Corrie Dandy was considering the Astral vent for her.

## 2017-03-02 DIAGNOSIS — N183 Chronic kidney disease, stage 3 (moderate): Secondary | ICD-10-CM | POA: Diagnosis not present

## 2017-03-02 DIAGNOSIS — D631 Anemia in chronic kidney disease: Secondary | ICD-10-CM | POA: Diagnosis not present

## 2017-03-02 DIAGNOSIS — J441 Chronic obstructive pulmonary disease with (acute) exacerbation: Secondary | ICD-10-CM | POA: Diagnosis not present

## 2017-03-02 DIAGNOSIS — L989 Disorder of the skin and subcutaneous tissue, unspecified: Secondary | ICD-10-CM | POA: Diagnosis not present

## 2017-03-02 DIAGNOSIS — I5033 Acute on chronic diastolic (congestive) heart failure: Secondary | ICD-10-CM | POA: Diagnosis not present

## 2017-03-02 DIAGNOSIS — I130 Hypertensive heart and chronic kidney disease with heart failure and stage 1 through stage 4 chronic kidney disease, or unspecified chronic kidney disease: Secondary | ICD-10-CM | POA: Diagnosis not present

## 2017-03-05 DIAGNOSIS — D631 Anemia in chronic kidney disease: Secondary | ICD-10-CM | POA: Diagnosis not present

## 2017-03-05 DIAGNOSIS — J441 Chronic obstructive pulmonary disease with (acute) exacerbation: Secondary | ICD-10-CM | POA: Diagnosis not present

## 2017-03-05 DIAGNOSIS — L989 Disorder of the skin and subcutaneous tissue, unspecified: Secondary | ICD-10-CM | POA: Diagnosis not present

## 2017-03-05 DIAGNOSIS — I130 Hypertensive heart and chronic kidney disease with heart failure and stage 1 through stage 4 chronic kidney disease, or unspecified chronic kidney disease: Secondary | ICD-10-CM | POA: Diagnosis not present

## 2017-03-05 DIAGNOSIS — I5033 Acute on chronic diastolic (congestive) heart failure: Secondary | ICD-10-CM | POA: Diagnosis not present

## 2017-03-05 DIAGNOSIS — N183 Chronic kidney disease, stage 3 (moderate): Secondary | ICD-10-CM | POA: Diagnosis not present

## 2017-03-06 DIAGNOSIS — J441 Chronic obstructive pulmonary disease with (acute) exacerbation: Secondary | ICD-10-CM | POA: Diagnosis not present

## 2017-03-06 DIAGNOSIS — I130 Hypertensive heart and chronic kidney disease with heart failure and stage 1 through stage 4 chronic kidney disease, or unspecified chronic kidney disease: Secondary | ICD-10-CM | POA: Diagnosis not present

## 2017-03-06 DIAGNOSIS — N183 Chronic kidney disease, stage 3 (moderate): Secondary | ICD-10-CM | POA: Diagnosis not present

## 2017-03-06 DIAGNOSIS — I5033 Acute on chronic diastolic (congestive) heart failure: Secondary | ICD-10-CM | POA: Diagnosis not present

## 2017-03-06 DIAGNOSIS — D631 Anemia in chronic kidney disease: Secondary | ICD-10-CM | POA: Diagnosis not present

## 2017-03-06 DIAGNOSIS — L989 Disorder of the skin and subcutaneous tissue, unspecified: Secondary | ICD-10-CM | POA: Diagnosis not present

## 2017-03-07 DIAGNOSIS — L989 Disorder of the skin and subcutaneous tissue, unspecified: Secondary | ICD-10-CM | POA: Diagnosis not present

## 2017-03-07 DIAGNOSIS — D631 Anemia in chronic kidney disease: Secondary | ICD-10-CM | POA: Diagnosis not present

## 2017-03-07 DIAGNOSIS — I5033 Acute on chronic diastolic (congestive) heart failure: Secondary | ICD-10-CM | POA: Diagnosis not present

## 2017-03-07 DIAGNOSIS — N183 Chronic kidney disease, stage 3 (moderate): Secondary | ICD-10-CM | POA: Diagnosis not present

## 2017-03-07 DIAGNOSIS — I130 Hypertensive heart and chronic kidney disease with heart failure and stage 1 through stage 4 chronic kidney disease, or unspecified chronic kidney disease: Secondary | ICD-10-CM | POA: Diagnosis not present

## 2017-03-07 DIAGNOSIS — J441 Chronic obstructive pulmonary disease with (acute) exacerbation: Secondary | ICD-10-CM | POA: Diagnosis not present

## 2017-03-08 DIAGNOSIS — J441 Chronic obstructive pulmonary disease with (acute) exacerbation: Secondary | ICD-10-CM | POA: Diagnosis not present

## 2017-03-08 DIAGNOSIS — N183 Chronic kidney disease, stage 3 (moderate): Secondary | ICD-10-CM | POA: Diagnosis not present

## 2017-03-08 DIAGNOSIS — L989 Disorder of the skin and subcutaneous tissue, unspecified: Secondary | ICD-10-CM | POA: Diagnosis not present

## 2017-03-08 DIAGNOSIS — D631 Anemia in chronic kidney disease: Secondary | ICD-10-CM | POA: Diagnosis not present

## 2017-03-08 DIAGNOSIS — I5033 Acute on chronic diastolic (congestive) heart failure: Secondary | ICD-10-CM | POA: Diagnosis not present

## 2017-03-08 DIAGNOSIS — I130 Hypertensive heart and chronic kidney disease with heart failure and stage 1 through stage 4 chronic kidney disease, or unspecified chronic kidney disease: Secondary | ICD-10-CM | POA: Diagnosis not present

## 2017-03-09 DIAGNOSIS — I130 Hypertensive heart and chronic kidney disease with heart failure and stage 1 through stage 4 chronic kidney disease, or unspecified chronic kidney disease: Secondary | ICD-10-CM | POA: Diagnosis not present

## 2017-03-09 DIAGNOSIS — L989 Disorder of the skin and subcutaneous tissue, unspecified: Secondary | ICD-10-CM | POA: Diagnosis not present

## 2017-03-09 DIAGNOSIS — I5033 Acute on chronic diastolic (congestive) heart failure: Secondary | ICD-10-CM | POA: Diagnosis not present

## 2017-03-09 DIAGNOSIS — D631 Anemia in chronic kidney disease: Secondary | ICD-10-CM | POA: Diagnosis not present

## 2017-03-09 DIAGNOSIS — N183 Chronic kidney disease, stage 3 (moderate): Secondary | ICD-10-CM | POA: Diagnosis not present

## 2017-03-09 DIAGNOSIS — J441 Chronic obstructive pulmonary disease with (acute) exacerbation: Secondary | ICD-10-CM | POA: Diagnosis not present

## 2017-03-10 DIAGNOSIS — I130 Hypertensive heart and chronic kidney disease with heart failure and stage 1 through stage 4 chronic kidney disease, or unspecified chronic kidney disease: Secondary | ICD-10-CM | POA: Diagnosis not present

## 2017-03-10 DIAGNOSIS — L989 Disorder of the skin and subcutaneous tissue, unspecified: Secondary | ICD-10-CM | POA: Diagnosis not present

## 2017-03-10 DIAGNOSIS — N183 Chronic kidney disease, stage 3 (moderate): Secondary | ICD-10-CM | POA: Diagnosis not present

## 2017-03-10 DIAGNOSIS — J441 Chronic obstructive pulmonary disease with (acute) exacerbation: Secondary | ICD-10-CM | POA: Diagnosis not present

## 2017-03-10 DIAGNOSIS — I5033 Acute on chronic diastolic (congestive) heart failure: Secondary | ICD-10-CM | POA: Diagnosis not present

## 2017-03-10 DIAGNOSIS — D631 Anemia in chronic kidney disease: Secondary | ICD-10-CM | POA: Diagnosis not present

## 2017-03-12 DIAGNOSIS — N183 Chronic kidney disease, stage 3 (moderate): Secondary | ICD-10-CM | POA: Diagnosis not present

## 2017-03-12 DIAGNOSIS — D631 Anemia in chronic kidney disease: Secondary | ICD-10-CM | POA: Diagnosis not present

## 2017-03-12 DIAGNOSIS — J441 Chronic obstructive pulmonary disease with (acute) exacerbation: Secondary | ICD-10-CM | POA: Diagnosis not present

## 2017-03-12 DIAGNOSIS — I5033 Acute on chronic diastolic (congestive) heart failure: Secondary | ICD-10-CM | POA: Diagnosis not present

## 2017-03-12 DIAGNOSIS — I130 Hypertensive heart and chronic kidney disease with heart failure and stage 1 through stage 4 chronic kidney disease, or unspecified chronic kidney disease: Secondary | ICD-10-CM | POA: Diagnosis not present

## 2017-03-12 DIAGNOSIS — L989 Disorder of the skin and subcutaneous tissue, unspecified: Secondary | ICD-10-CM | POA: Diagnosis not present

## 2017-03-13 DIAGNOSIS — J449 Chronic obstructive pulmonary disease, unspecified: Secondary | ICD-10-CM | POA: Diagnosis not present

## 2017-03-13 DIAGNOSIS — L989 Disorder of the skin and subcutaneous tissue, unspecified: Secondary | ICD-10-CM | POA: Diagnosis not present

## 2017-03-13 DIAGNOSIS — J441 Chronic obstructive pulmonary disease with (acute) exacerbation: Secondary | ICD-10-CM | POA: Diagnosis not present

## 2017-03-13 DIAGNOSIS — I130 Hypertensive heart and chronic kidney disease with heart failure and stage 1 through stage 4 chronic kidney disease, or unspecified chronic kidney disease: Secondary | ICD-10-CM | POA: Diagnosis not present

## 2017-03-13 DIAGNOSIS — S81801D Unspecified open wound, right lower leg, subsequent encounter: Secondary | ICD-10-CM | POA: Diagnosis not present

## 2017-03-13 DIAGNOSIS — I872 Venous insufficiency (chronic) (peripheral): Secondary | ICD-10-CM | POA: Diagnosis not present

## 2017-03-13 DIAGNOSIS — D631 Anemia in chronic kidney disease: Secondary | ICD-10-CM | POA: Diagnosis not present

## 2017-03-13 DIAGNOSIS — N183 Chronic kidney disease, stage 3 (moderate): Secondary | ICD-10-CM | POA: Diagnosis not present

## 2017-03-13 DIAGNOSIS — I5033 Acute on chronic diastolic (congestive) heart failure: Secondary | ICD-10-CM | POA: Diagnosis not present

## 2017-03-15 DIAGNOSIS — J441 Chronic obstructive pulmonary disease with (acute) exacerbation: Secondary | ICD-10-CM | POA: Diagnosis not present

## 2017-03-15 DIAGNOSIS — I5033 Acute on chronic diastolic (congestive) heart failure: Secondary | ICD-10-CM | POA: Diagnosis not present

## 2017-03-15 DIAGNOSIS — D631 Anemia in chronic kidney disease: Secondary | ICD-10-CM | POA: Diagnosis not present

## 2017-03-15 DIAGNOSIS — L989 Disorder of the skin and subcutaneous tissue, unspecified: Secondary | ICD-10-CM | POA: Diagnosis not present

## 2017-03-15 DIAGNOSIS — G4733 Obstructive sleep apnea (adult) (pediatric): Secondary | ICD-10-CM | POA: Diagnosis not present

## 2017-03-15 DIAGNOSIS — I130 Hypertensive heart and chronic kidney disease with heart failure and stage 1 through stage 4 chronic kidney disease, or unspecified chronic kidney disease: Secondary | ICD-10-CM | POA: Diagnosis not present

## 2017-03-15 DIAGNOSIS — N183 Chronic kidney disease, stage 3 (moderate): Secondary | ICD-10-CM | POA: Diagnosis not present

## 2017-03-16 DIAGNOSIS — L989 Disorder of the skin and subcutaneous tissue, unspecified: Secondary | ICD-10-CM | POA: Diagnosis not present

## 2017-03-16 DIAGNOSIS — D631 Anemia in chronic kidney disease: Secondary | ICD-10-CM | POA: Diagnosis not present

## 2017-03-16 DIAGNOSIS — J441 Chronic obstructive pulmonary disease with (acute) exacerbation: Secondary | ICD-10-CM | POA: Diagnosis not present

## 2017-03-16 DIAGNOSIS — I5033 Acute on chronic diastolic (congestive) heart failure: Secondary | ICD-10-CM | POA: Diagnosis not present

## 2017-03-16 DIAGNOSIS — I130 Hypertensive heart and chronic kidney disease with heart failure and stage 1 through stage 4 chronic kidney disease, or unspecified chronic kidney disease: Secondary | ICD-10-CM | POA: Diagnosis not present

## 2017-03-16 DIAGNOSIS — N183 Chronic kidney disease, stage 3 (moderate): Secondary | ICD-10-CM | POA: Diagnosis not present

## 2017-03-19 DIAGNOSIS — D631 Anemia in chronic kidney disease: Secondary | ICD-10-CM | POA: Diagnosis not present

## 2017-03-19 DIAGNOSIS — N183 Chronic kidney disease, stage 3 (moderate): Secondary | ICD-10-CM | POA: Diagnosis not present

## 2017-03-19 DIAGNOSIS — J441 Chronic obstructive pulmonary disease with (acute) exacerbation: Secondary | ICD-10-CM | POA: Diagnosis not present

## 2017-03-19 DIAGNOSIS — L989 Disorder of the skin and subcutaneous tissue, unspecified: Secondary | ICD-10-CM | POA: Diagnosis not present

## 2017-03-19 DIAGNOSIS — R0902 Hypoxemia: Secondary | ICD-10-CM | POA: Diagnosis not present

## 2017-03-19 DIAGNOSIS — M47817 Spondylosis without myelopathy or radiculopathy, lumbosacral region: Secondary | ICD-10-CM | POA: Diagnosis not present

## 2017-03-19 DIAGNOSIS — I130 Hypertensive heart and chronic kidney disease with heart failure and stage 1 through stage 4 chronic kidney disease, or unspecified chronic kidney disease: Secondary | ICD-10-CM | POA: Diagnosis not present

## 2017-03-19 DIAGNOSIS — M899 Disorder of bone, unspecified: Secondary | ICD-10-CM | POA: Diagnosis not present

## 2017-03-19 DIAGNOSIS — I5033 Acute on chronic diastolic (congestive) heart failure: Secondary | ICD-10-CM | POA: Diagnosis not present

## 2017-03-19 DIAGNOSIS — J449 Chronic obstructive pulmonary disease, unspecified: Secondary | ICD-10-CM | POA: Diagnosis not present

## 2017-03-20 DIAGNOSIS — N183 Chronic kidney disease, stage 3 (moderate): Secondary | ICD-10-CM | POA: Diagnosis not present

## 2017-03-20 DIAGNOSIS — J441 Chronic obstructive pulmonary disease with (acute) exacerbation: Secondary | ICD-10-CM | POA: Diagnosis not present

## 2017-03-20 DIAGNOSIS — L989 Disorder of the skin and subcutaneous tissue, unspecified: Secondary | ICD-10-CM | POA: Diagnosis not present

## 2017-03-20 DIAGNOSIS — D631 Anemia in chronic kidney disease: Secondary | ICD-10-CM | POA: Diagnosis not present

## 2017-03-20 DIAGNOSIS — I130 Hypertensive heart and chronic kidney disease with heart failure and stage 1 through stage 4 chronic kidney disease, or unspecified chronic kidney disease: Secondary | ICD-10-CM | POA: Diagnosis not present

## 2017-03-20 DIAGNOSIS — I5033 Acute on chronic diastolic (congestive) heart failure: Secondary | ICD-10-CM | POA: Diagnosis not present

## 2017-03-21 DIAGNOSIS — N183 Chronic kidney disease, stage 3 (moderate): Secondary | ICD-10-CM | POA: Diagnosis not present

## 2017-03-21 DIAGNOSIS — D631 Anemia in chronic kidney disease: Secondary | ICD-10-CM | POA: Diagnosis not present

## 2017-03-21 DIAGNOSIS — L989 Disorder of the skin and subcutaneous tissue, unspecified: Secondary | ICD-10-CM | POA: Diagnosis not present

## 2017-03-21 DIAGNOSIS — J441 Chronic obstructive pulmonary disease with (acute) exacerbation: Secondary | ICD-10-CM | POA: Diagnosis not present

## 2017-03-21 DIAGNOSIS — I5033 Acute on chronic diastolic (congestive) heart failure: Secondary | ICD-10-CM | POA: Diagnosis not present

## 2017-03-21 DIAGNOSIS — I130 Hypertensive heart and chronic kidney disease with heart failure and stage 1 through stage 4 chronic kidney disease, or unspecified chronic kidney disease: Secondary | ICD-10-CM | POA: Diagnosis not present

## 2017-03-22 DIAGNOSIS — L989 Disorder of the skin and subcutaneous tissue, unspecified: Secondary | ICD-10-CM | POA: Diagnosis not present

## 2017-03-22 DIAGNOSIS — D631 Anemia in chronic kidney disease: Secondary | ICD-10-CM | POA: Diagnosis not present

## 2017-03-22 DIAGNOSIS — N183 Chronic kidney disease, stage 3 (moderate): Secondary | ICD-10-CM | POA: Diagnosis not present

## 2017-03-22 DIAGNOSIS — I130 Hypertensive heart and chronic kidney disease with heart failure and stage 1 through stage 4 chronic kidney disease, or unspecified chronic kidney disease: Secondary | ICD-10-CM | POA: Diagnosis not present

## 2017-03-22 DIAGNOSIS — I5033 Acute on chronic diastolic (congestive) heart failure: Secondary | ICD-10-CM | POA: Diagnosis not present

## 2017-03-22 DIAGNOSIS — J441 Chronic obstructive pulmonary disease with (acute) exacerbation: Secondary | ICD-10-CM | POA: Diagnosis not present

## 2017-03-26 DIAGNOSIS — J9601 Acute respiratory failure with hypoxia: Secondary | ICD-10-CM | POA: Diagnosis not present

## 2017-03-26 DIAGNOSIS — M6281 Muscle weakness (generalized): Secondary | ICD-10-CM | POA: Diagnosis not present

## 2017-03-26 DIAGNOSIS — Z9181 History of falling: Secondary | ICD-10-CM | POA: Diagnosis not present

## 2017-03-26 DIAGNOSIS — I5042 Chronic combined systolic (congestive) and diastolic (congestive) heart failure: Secondary | ICD-10-CM | POA: Diagnosis not present

## 2017-03-27 DIAGNOSIS — L989 Disorder of the skin and subcutaneous tissue, unspecified: Secondary | ICD-10-CM | POA: Diagnosis not present

## 2017-03-27 DIAGNOSIS — D631 Anemia in chronic kidney disease: Secondary | ICD-10-CM | POA: Diagnosis not present

## 2017-03-27 DIAGNOSIS — J441 Chronic obstructive pulmonary disease with (acute) exacerbation: Secondary | ICD-10-CM | POA: Diagnosis not present

## 2017-03-27 DIAGNOSIS — N183 Chronic kidney disease, stage 3 (moderate): Secondary | ICD-10-CM | POA: Diagnosis not present

## 2017-03-27 DIAGNOSIS — I130 Hypertensive heart and chronic kidney disease with heart failure and stage 1 through stage 4 chronic kidney disease, or unspecified chronic kidney disease: Secondary | ICD-10-CM | POA: Diagnosis not present

## 2017-03-27 DIAGNOSIS — I5033 Acute on chronic diastolic (congestive) heart failure: Secondary | ICD-10-CM | POA: Diagnosis not present

## 2017-03-28 DIAGNOSIS — L989 Disorder of the skin and subcutaneous tissue, unspecified: Secondary | ICD-10-CM | POA: Diagnosis not present

## 2017-03-28 DIAGNOSIS — I130 Hypertensive heart and chronic kidney disease with heart failure and stage 1 through stage 4 chronic kidney disease, or unspecified chronic kidney disease: Secondary | ICD-10-CM | POA: Diagnosis not present

## 2017-03-28 DIAGNOSIS — N183 Chronic kidney disease, stage 3 (moderate): Secondary | ICD-10-CM | POA: Diagnosis not present

## 2017-03-28 DIAGNOSIS — D631 Anemia in chronic kidney disease: Secondary | ICD-10-CM | POA: Diagnosis not present

## 2017-03-28 DIAGNOSIS — J441 Chronic obstructive pulmonary disease with (acute) exacerbation: Secondary | ICD-10-CM | POA: Diagnosis not present

## 2017-03-28 DIAGNOSIS — I5033 Acute on chronic diastolic (congestive) heart failure: Secondary | ICD-10-CM | POA: Diagnosis not present

## 2017-03-29 DIAGNOSIS — D631 Anemia in chronic kidney disease: Secondary | ICD-10-CM | POA: Diagnosis not present

## 2017-03-29 DIAGNOSIS — J441 Chronic obstructive pulmonary disease with (acute) exacerbation: Secondary | ICD-10-CM | POA: Diagnosis not present

## 2017-03-29 DIAGNOSIS — Z6841 Body Mass Index (BMI) 40.0 and over, adult: Secondary | ICD-10-CM | POA: Diagnosis not present

## 2017-03-29 DIAGNOSIS — I130 Hypertensive heart and chronic kidney disease with heart failure and stage 1 through stage 4 chronic kidney disease, or unspecified chronic kidney disease: Secondary | ICD-10-CM | POA: Diagnosis not present

## 2017-03-29 DIAGNOSIS — I5032 Chronic diastolic (congestive) heart failure: Secondary | ICD-10-CM | POA: Diagnosis not present

## 2017-03-29 DIAGNOSIS — H6123 Impacted cerumen, bilateral: Secondary | ICD-10-CM | POA: Diagnosis not present

## 2017-03-29 DIAGNOSIS — I129 Hypertensive chronic kidney disease with stage 1 through stage 4 chronic kidney disease, or unspecified chronic kidney disease: Secondary | ICD-10-CM | POA: Diagnosis not present

## 2017-03-29 DIAGNOSIS — L989 Disorder of the skin and subcutaneous tissue, unspecified: Secondary | ICD-10-CM | POA: Diagnosis not present

## 2017-03-29 DIAGNOSIS — N183 Chronic kidney disease, stage 3 (moderate): Secondary | ICD-10-CM | POA: Diagnosis not present

## 2017-03-29 DIAGNOSIS — J449 Chronic obstructive pulmonary disease, unspecified: Secondary | ICD-10-CM | POA: Diagnosis not present

## 2017-03-29 DIAGNOSIS — I5033 Acute on chronic diastolic (congestive) heart failure: Secondary | ICD-10-CM | POA: Diagnosis not present

## 2017-03-29 DIAGNOSIS — H9202 Otalgia, left ear: Secondary | ICD-10-CM | POA: Diagnosis not present

## 2017-04-03 ENCOUNTER — Ambulatory Visit: Payer: Medicare HMO | Admitting: Adult Health

## 2017-04-10 DIAGNOSIS — S81801D Unspecified open wound, right lower leg, subsequent encounter: Secondary | ICD-10-CM | POA: Diagnosis not present

## 2017-04-11 ENCOUNTER — Ambulatory Visit
Admission: RE | Admit: 2017-04-11 | Discharge: 2017-04-11 | Disposition: A | Discharge status: Home / Self Care | Payer: Medicare HMO | Source: Ambulatory Visit | Attending: Cardiology | Admitting: Cardiology

## 2017-04-11 ENCOUNTER — Encounter: Payer: Self-pay | Admitting: Cardiology

## 2017-04-11 ENCOUNTER — Ambulatory Visit (INDEPENDENT_AMBULATORY_CARE_PROVIDER_SITE_OTHER): Payer: Medicare HMO | Admitting: Cardiology

## 2017-04-11 VITALS — BP 112/64 | HR 62 | Ht 62.0 in | Wt 227.5 lb

## 2017-04-11 DIAGNOSIS — I130 Hypertensive heart and chronic kidney disease with heart failure and stage 1 through stage 4 chronic kidney disease, or unspecified chronic kidney disease: Secondary | ICD-10-CM | POA: Diagnosis not present

## 2017-04-11 DIAGNOSIS — I5032 Chronic diastolic (congestive) heart failure: Secondary | ICD-10-CM

## 2017-04-11 DIAGNOSIS — R0602 Shortness of breath: Secondary | ICD-10-CM | POA: Diagnosis not present

## 2017-04-11 DIAGNOSIS — I10 Essential (primary) hypertension: Secondary | ICD-10-CM | POA: Diagnosis not present

## 2017-04-11 DIAGNOSIS — R05 Cough: Secondary | ICD-10-CM | POA: Diagnosis not present

## 2017-04-11 NOTE — Patient Instructions (Addendum)
Medication Instructions:  Your physician recommends that you continue on your current medications as directed. Please refer to the Current Medication list given to you today.   Labwork: TODAY: BMET, BNP  Testing/Procedures: Please go today to have a CHEST XRAY. It is located at [address] [place], [place] [address] in the [facility].   Follow-Up: Your physician wants you to follow-up in: 3 months with Dr. Theodosia Blender assistant. You will receive a reminder letter in the mail two months in advance. If you don't receive a letter, please call our office to schedule the follow-up appointment.   Your physician wants you to follow-up in: 6 months with Dr. Radford Pax. You will receive a reminder letter in the mail two months in advance. If you don't receive a letter, please call our office to schedule the follow-up appointment.   Any Other Special Instructions Will Be Listed Below (If Applicable).     If you need a refill on your cardiac medications before your next appointment, please call your pharmacy.

## 2017-04-11 NOTE — Progress Notes (Signed)
Cardiology Office Note    Date:  04/11/2017   ID:  Amanda Horton, DOB [birth year], MRN [id number]  PCP:  Nicoletta Dress, MD  Cardiologist:  Fransico Him, MD   Chief Complaint  Patient presents with  . Congestive Heart Failure  . Hypertension    History of Present Illness:  Amanda Horton is a 71 y.o. female with a history of hyperlipidemia, chronic venous insuff, CKD stage 3, chronic diastolic CHF and HTN who presents today for followup. She had a recent admission for acute hypoxic respiratory failure in the setting of acute diastolic CHF exacerbation and PNA in January 2018 and was treated with diuretics and antibiotics.  2D echo showed an EF of 55%, moderate LVH and grade 1 DD. She is now on  Lasix 80 mg daily. SCr day of discharge was 1.93. She was discharged on 2 L [place]. She was seen back by my PA in February and was doing well. No changes were made to her meds.   She presents today and is feeling good.  She denies any chest pain or pressure, dizziness, palpitations, dizziness or syncope.  Her weight has dropped 20lbs since February.  Her last creatinine was 1.7 in March.  She continue on 2L O2 by [place].  Her O2 sats at home are running about 93-94%.  Her LE edema has significantly improved and essentially resolved.  She sleeps in a recliner and rarely will wake up SOB.  She has OSA and uses a BiPAP at night when she can tolerate it.     Past Medical History:  Diagnosis Date  . Allergic rhinitis   . Anemia due to chronic kidney disease   . APC (atrial premature contractions)   . Arthritis of lumbar spine ([place])   . Benign essential hypertension   . Borderline diabetes   . Chronic insomnia   . Chronic venous insufficiency   . CKD (chronic kidney disease), stage III   . COPD (chronic obstructive pulmonary disease) ([place])   . GERD (gastroesophageal reflux disease)   . Hyperlipemia   . Hypoxia   . Lumbar disc narrowing   . On home oxygen therapy   . OSA (obstructive sleep apnea)     . Osteoporosis   . Respiratory failure requiring intubation ([place])   . Spinal stenosis of lumbar region without neurogenic claudication   . Varicose veins with pain   . Vitamin B12 deficiency   . Vitamin D deficiency     Past Surgical History:  Procedure Laterality Date  . APPLICATION OF A-CELL OF EXTREMITY Right 08/30/2016   Procedure: APPLICATION OF A-CELL OF EXTREMITY;  Surgeon: Loel Lofty Dillingham, DO;  Location: [facility];  Service: Plastics;  Laterality: Right;  . APPLICATION OF A-CELL OF EXTREMITY Right 09/20/2016   Procedure: APPLICATION OF A-CELL OF right lower leg wound;  Surgeon: Loel Lofty Dillingham, DO;  Location: [facility];  Service: Plastics;  Laterality: Right;  . APPLICATION OF WOUND VAC Right 08/30/2016   Procedure: APPLICATION OF WOUND VAC;  Surgeon: Loel Lofty Dillingham, DO;  Location: [facility];  Service: Plastics;  Laterality: Right;  . APPLICATION OF WOUND VAC Right 09/20/2016   Procedure: APPLICATION OF WOUND VAC right lower leg wound;  Surgeon: Loel Lofty Dillingham, DO;  Location: [facility];  Service: Plastics;  Laterality: Right;  . I&D EXTREMITY Right 08/30/2016   Procedure: IRRIGATION AND DEBRIDEMENT EXTREMITY RIGHT;  Surgeon: Loel Lofty Dillingham, DO;  Location: [facility];  Service: Plastics;  Laterality: Right;  . INCISION  AND DRAINAGE OF WOUND Right 09/20/2016   Procedure: IRRIGATION AND DEBRIDEMENT right lower leg wound;  Surgeon: Loel Lofty Dillingham, DO;  Location: [facility];  Service: Plastics;  Laterality: Right;  . PARTIAL HYSTERECTOMY      Current Medications: Current Meds  Medication Sig  . albuterol (PROVENTIL HFA;VENTOLIN HFA) 108 (90 BASE) MCG/ACT inhaler Inhale 2 puffs into the lungs every 6 (six) hours as needed for wheezing or shortness of breath.  Marland Kitchen alendronate (FOSAMAX) 70 MG tablet Take 70 mg by mouth every Saturday. Take with a full glass of water on an empty stomach.   Marland Kitchen amLODipine (NORVASC) 2.5 MG tablet Take 1 tablet (2.5 mg total) by mouth daily.  Marland Kitchen aspirin 81 MG  tablet Take 81 mg by mouth daily.  Marland Kitchen escitalopram (LEXAPRO) 20 MG tablet Take 20 mg by mouth daily.   . fluticasone furoate-vilanterol (BREO ELLIPTA) 200-25 MCG/INH AEPB Inhale 1 puff into the lungs daily.  . Fluticasone-Salmeterol (ADVAIR) 500-50 MCG/DOSE AEPB Inhale 1 puff into the lungs 2 (two) times daily.  . Fluticasone-Umeclidin-Vilant (TRELEGY ELLIPTA) 100-62.5-25 MCG/INH AEPB Inhale 1 puff into the lungs daily.  . furosemide (LASIX) 20 MG tablet Take 20 mg by mouth daily.   Marland Kitchen guaifenesin (HUMIBID E) 400 MG TABS tablet Take 800 mg by mouth 2 (two) times daily.  Marland Kitchen linaclotide (LINZESS) 145 MCG CAPS capsule Take 145 mcg by mouth daily.   . metoprolol (LOPRESSOR) 50 MG tablet Take 1 tablet (50 mg total) by mouth 2 (two) times daily.  Marland Kitchen omeprazole (PRILOSEC) 40 MG capsule Take 40 mg by mouth daily.   . ondansetron (ZOFRAN) 4 MG tablet Take 1 tablet (4 mg total) by mouth every 6 (six) hours as needed for nausea.  Marland Kitchen oxyCODONE-acetaminophen (PERCOCET/ROXICET) 5-325 MG tablet Take 1 tablet by mouth every 8 (eight) hours as needed for moderate pain.  . ranitidine (ZANTAC) 150 MG tablet Take 1 tablet by mouth 2 (two) times daily.  . Tiotropium Bromide Monohydrate 2.5 MCG/ACT AERS Inhale 2 puffs into the lungs daily.  . Vitamin D, Ergocalciferol, (DRISDOL) 50000 UNITS CAPS capsule Take 50,000 Units by mouth every Saturday.     Allergies:   Sulfa antibiotics and Tape   Social History   Social History  . Marital status: Single    Spouse name: N/A  . Number of children: N/A  . Years of education: N/A   Occupational History  . Retired     [employer]   Social History Main Topics  . Smoking status: Former Smoker    Packs/day: 1.00    Years: 55.00    Types: Cigarettes    Quit date: 12/04/2012  . Smokeless tobacco: Never Used     Comment: Stopped in 2014  . Alcohol use No  . Drug use: No  . Sexual activity: Not Asked   Other Topics Concern  . None   Social History Narrative  .  None     Family History:  The patient's family history includes Asthma in her father; Emphysema in her father; Throat cancer in her mother.   ROS:   Please see the history of present illness.    ROS All other systems reviewed and are negative.  No flowsheet data found.     PHYSICAL EXAM:   VS:  BP 112/64   Pulse 62   Ht 5\' 2"  (1.575 m)   Wt 227 lb 8 oz (103.2 kg)   SpO2 97% Comment: on 2L O2  BMI 41.61 kg/m  GEN: Well nourished, well developed, in no acute distress  HEENT: normal  Neck: no JVD, carotid bruits, or masses Cardiac: RRR; no murmurs, rubs, or gallops.  Trace edema.  Intact distal pulses bilaterally.  Respiratory:  Few crackles at the bases with scattered wheezes and ronchi GI: soft, nontender, nondistended, + BS MS: no deformity or atrophy  Skin: warm and dry, no rash Neuro:  Alert and Oriented x 3, Strength and sensation are intact Psych: euthymic mood, full affect  Wt Readings from Last 3 Encounters:  04/11/17 227 lb 8 oz (103.2 kg)  02/20/17 243 lb (110.2 kg)  02/02/17 242 lb 8.1 oz (110 kg)      Studies/Labs Reviewed:   EKG:  EKG is not ordered today.    Recent Labs: 01/25/2017: ALT 10; B Natriuretic Peptide 106.2; Magnesium 2.1 01/31/2017: Hemoglobin 8.9; Platelets 233 02/02/2017: BUN 23; Creatinine, Ser 1.70; Potassium 3.6; Sodium 143   Lipid Panel No results found for: CHOL, TRIG, HDL, CHOLHDL, VLDL, LDLCALC, LDLDIRECT  Additional studies/ records that were reviewed today include:  Hospital notes and labs    ASSESSMENT:    1. Chronic diastolic heart failure ([place])   2. Essential hypertension   3. Hypertensive heart and chronic kidney disease with heart failure and stage 1 through stage 4 chronic kidney disease, or chronic kidney disease ([place])   4. Morbid obesity ([place])      PLAN:  In order of problems listed above:  1.  Chronic diastolic CHF - she may be volume overloaded today.   She has lost over 20lbs since February.  She is  currently on Lasix 80mg  daily.    She has come crackles in her lungs at the bases with some wheezes and rhonchi so difficult to tell whether this is related to COPD vs. Volume overload.  She also has trace edema in her legs.  She has lost 20lbs so this may all be due to her underlying lung disease.  I will also check a BNP and if elevated then increase lasix to 80mg  BID for 3 days and then 80mg  qam and 40mg  qpm.  I will check a PA and lat CXRAY today.  2.  HTN - BP is adequately controlled on current meds. She will continue on amlodipine 2.5mg  daily and metoprolol 50mg  BID.    3.   Hypertensive Heart and CKD stage 3 with CHF - Her last creatinine was 1.7 in march.  I will repeat a BMET today.  4.   Morbid obesity - I have encouraged him to get into a routine exercise program and cut back on carbs and portions.     Medication Adjustments/Labs and Tests Ordered: Current medicines are reviewed at length with the patient today.  Concerns regarding medicines are outlined above.  Medication changes, Labs and Tests ordered today are listed in the Patient Instructions below.  Patient Instructions  Medication Instructions:  Your physician recommends that you continue on your current medications as directed. Please refer to the Current Medication list given to you today.   Labwork: TODAY: BMET  Testing/Procedures: None  Follow-Up: Your physician wants you to follow-up in: 6 months with Dr. Radford Pax. You will receive a reminder letter in the mail two months in advance. If you don't receive a letter, please call our office to schedule the follow-up appointment.   Any Other Special Instructions Will Be Listed Below (If Applicable).     If you need a refill on your cardiac medications before your next appointment, please  call your pharmacy.      Signed, Fransico Him, MD  04/11/2017 11:28 AM    [address] [place], [place], [place]  [address] Phone: [phone];  Fax: [phone]

## 2017-04-12 LAB — PRO B NATRIURETIC PEPTIDE: NT-Pro BNP: 1003 pg/mL — ABNORMAL HIGH (ref 0–301)

## 2017-04-12 LAB — BASIC METABOLIC PANEL
BUN/Creatinine Ratio: 9 — ABNORMAL LOW (ref 12–28)
BUN: 14 mg/dL (ref 8–27)
CO2: 39 mmol/L — ABNORMAL HIGH (ref 18–29)
CREATININE: 1.6 mg/dL — AB (ref 0.57–1.00)
Calcium: 8.9 mg/dL (ref 8.7–10.3)
Chloride: 85 mmol/L — ABNORMAL LOW (ref 96–106)
GFR, EST AFRICAN AMERICAN: 37 mL/min/{1.73_m2} — AB (ref >59)
GFR, EST NON AFRICAN AMERICAN: 32 mL/min/{1.73_m2} — AB (ref >59)
Glucose: 118 mg/dL — ABNORMAL HIGH (ref 65–99)
POTASSIUM: 3.9 mmol/L (ref 3.5–5.2)
SODIUM: 140 mmol/L (ref 134–144)

## 2017-04-13 ENCOUNTER — Ambulatory Visit (INDEPENDENT_AMBULATORY_CARE_PROVIDER_SITE_OTHER): Payer: Medicare HMO | Admitting: Adult Health

## 2017-04-13 ENCOUNTER — Encounter: Payer: Self-pay | Admitting: Adult Health

## 2017-04-13 ENCOUNTER — Telehealth: Payer: Self-pay | Admitting: Cardiology

## 2017-04-13 DIAGNOSIS — J439 Emphysema, unspecified: Secondary | ICD-10-CM | POA: Diagnosis not present

## 2017-04-13 DIAGNOSIS — G4733 Obstructive sleep apnea (adult) (pediatric): Secondary | ICD-10-CM

## 2017-04-13 DIAGNOSIS — I5032 Chronic diastolic (congestive) heart failure: Secondary | ICD-10-CM

## 2017-04-13 DIAGNOSIS — R9389 Abnormal findings on diagnostic imaging of other specified body structures: Secondary | ICD-10-CM

## 2017-04-13 MED ORDER — FUROSEMIDE 80 MG PO TABS: ORAL_TABLET | ORAL | 11 refills | Status: DC

## 2017-04-13 NOTE — Assessment & Plan Note (Signed)
Improved control  Cont on TRELEGY

## 2017-04-13 NOTE — Assessment & Plan Note (Signed)
Improved on current regimen .  Cont same plan

## 2017-04-13 NOTE — Patient Instructions (Addendum)
Continue on BIPAP At bedtime  With Oxygen 4l/m  Continue TRELEGY 1 puff daily .  Continue on Oxygen 2l/m .  Follow up with Dr. Elsworth Soho 2-3 months and As needed   Please contact office for sooner follow up if symptoms do not improve or worsen or seek emergency care

## 2017-04-13 NOTE — Telephone Encounter (Signed)
-----   Message from Sueanne Margarita, MD sent at 04/13/2017 12:48 PM EDT ----- BNP elevated - please have her increase Lasix to 80mg  BID for 3 days and then 80mg  qam and 40mg  qpm.  Check a BMET in 1 week

## 2017-04-13 NOTE — Assessment & Plan Note (Signed)
Improved compliance and adequate control of OSA/OHS .  Improved clinically   Plan  Cont on BIPAP At bedtime  w/ O2  ONO results pending .

## 2017-04-13 NOTE — Progress Notes (Signed)
@Patient  ID: Amanda Horton, female    DOB: 10/30/1946, 71 y.o.   MRN: [id number]  Chief Complaint  Patient presents with  . Follow-up    OSA     Referring provider: Nicoletta Dress, MD  HPI: 71 yo female former smoker followed for COPD O2 dependent RF On O2 , OSA/OHS, combined CHF (EF 45%) .   TEST  PFT (03/2014), done at [facility],  FEV1/FVC  77%,  FEV1  0.58  26%.  CXR (01/31/17)  RVD, Cardiomegaly  04/13/2017 Follow up : OSA/OHS on BIPAP /COPD / O2 RF  Patient returns for 6 week follow-up. Last visit was having difficulty with her BiPAP machine. Patient's machine was changed. Patient was also noticing decreased oxygen levels at nighttime with her BiPAP machine and was having to take it off. Patient's BiPAP settings were changed to to 22/6 cmH20 with 4l/m of Oxygen . ONO on new settings was done but results are still pending Since that change she is feeling better with no significant drops in oxygen. Now is able to wear BIPAP each night.  Download shows excellent compliance with average usage around 4 hours. AHI 6.5. Minimum leaks  Patient has COPD. Changed to TRELEGY last ov. Feels it is doing okay .  Likes only using it once daliy . Denies any flare cough or wheezing. Patient is accompanied by her sons. She is doing better at home, able to walk with oxygen now in the house with min desats on 2l/m . Less DOE  Leg swelling is much better. Wt is down >20lbs  Breathing is better overall .     Allergies  Allergen Reactions  . Sulfa Antibiotics Nausea And Vomiting and Other (See Comments)  . Tape Other (See Comments)    SKIN IS VERY THIN AND WILL BRUISE & TEAR EASILY!!    Immunization History  Administered Date(s) Administered  . Influenza, High Dose Seasonal PF 09/03/2016  . Influenza-Unspecified 10/09/2014  . Pneumococcal Conjugate-13 09/27/2014  . Pneumococcal Polysaccharide-23 01/24/2012  . Tdap 08/27/2016    Past Medical History:  Diagnosis Date  . Allergic  rhinitis   . Anemia due to chronic kidney disease   . APC (atrial premature contractions)   . Arthritis of lumbar spine ([place])   . Benign essential hypertension   . Borderline diabetes   . Chronic insomnia   . Chronic venous insufficiency   . CKD (chronic kidney disease), stage III   . COPD (chronic obstructive pulmonary disease) ([place])   . GERD (gastroesophageal reflux disease)   . Hyperlipemia   . Hypoxia   . Lumbar disc narrowing   . On home oxygen therapy   . OSA (obstructive sleep apnea)   . Osteoporosis   . Respiratory failure requiring intubation ([place])   . Spinal stenosis of lumbar region without neurogenic claudication   . Varicose veins with pain   . Vitamin B12 deficiency   . Vitamin D deficiency     Tobacco History: History  Smoking Status  . Former Smoker  . Packs/day: 1.00  . Years: 55.00  . Types: Cigarettes  . Quit date: 12/04/2012  Smokeless Tobacco  . Never Used    Comment: Stopped in 2014   Counseling given: Not Answered   Outpatient Encounter Prescriptions as of 04/13/2017  Medication Sig  . albuterol (PROVENTIL HFA;VENTOLIN HFA) 108 (90 BASE) MCG/ACT inhaler Inhale 2 puffs into the lungs every 6 (six) hours as needed for wheezing or shortness of breath.  Marland Kitchen alendronate (FOSAMAX) 70 MG  tablet Take 70 mg by mouth every Saturday. Take with a full glass of water on an empty stomach.   Marland Kitchen amLODipine (NORVASC) 2.5 MG tablet Take 1 tablet (2.5 mg total) by mouth daily.  Marland Kitchen aspirin 81 MG tablet Take 81 mg by mouth daily.  Marland Kitchen escitalopram (LEXAPRO) 20 MG tablet Take 20 mg by mouth daily.   . Fluticasone-Umeclidin-Vilant (TRELEGY ELLIPTA) 100-62.5-25 MCG/INH AEPB Inhale 1 puff into the lungs daily.  . furosemide (LASIX) 80 MG tablet Take 80 mg by mouth daily.   Marland Kitchen guaifenesin (HUMIBID E) 400 MG TABS tablet Take 800 mg by mouth 2 (two) times daily.  Marland Kitchen linaclotide (LINZESS) 145 MCG CAPS capsule Take 145 mcg by mouth daily as needed.   . metoprolol (LOPRESSOR) 50 MG  tablet Take 1 tablet (50 mg total) by mouth 2 (two) times daily.  Marland Kitchen omeprazole (PRILOSEC) 40 MG capsule Take 40 mg by mouth daily.   . ondansetron (ZOFRAN) 4 MG tablet Take 1 tablet (4 mg total) by mouth every 6 (six) hours as needed for nausea.  Marland Kitchen oxyCODONE-acetaminophen (PERCOCET/ROXICET) 5-325 MG tablet Take 1 tablet by mouth every 8 (eight) hours as needed for moderate pain.  . ranitidine (ZANTAC) 150 MG tablet Take 1 tablet by mouth 2 (two) times daily.  . Vitamin D, Ergocalciferol, (DRISDOL) 50000 UNITS CAPS capsule Take 50,000 Units by mouth every Saturday.   Marland Kitchen albuterol (PROVENTIL) (2.5 MG/3ML) 0.083% nebulizer solution Take 3 mLs (2.5 mg total) by nebulization every 4 (four) hours as needed for wheezing or shortness of breath.  . [DISCONTINUED] fluticasone furoate-vilanterol (BREO ELLIPTA) 200-25 MCG/INH AEPB Inhale 1 puff into the lungs daily.  . [DISCONTINUED] Fluticasone-Salmeterol (ADVAIR) 500-50 MCG/DOSE AEPB Inhale 1 puff into the lungs 2 (two) times daily.  . [DISCONTINUED] Tiotropium Bromide Monohydrate 2.5 MCG/ACT AERS Inhale 2 puffs into the lungs daily. (Patient not taking: Reported on 04/13/2017)   No facility-administered encounter medications on file as of 04/13/2017.      Review of Systems  Constitutional:   No  weight loss, night sweats,  Fevers, chills,  +fatigue, or  lassitude.  HEENT:   No headaches,  Difficulty swallowing,  Tooth/dental problems, or  Sore throat,                No sneezing, itching, ear ache,  +nasal congestion, post nasal drip,   CV:  No chest pain,  Orthopnea, PND, , anasarca, dizziness, palpitations, syncope.   GI  No heartburn, indigestion, abdominal pain, nausea, vomiting, diarrhea, change in bowel habits, loss of appetite, bloody stools.   Resp:   No excess mucus, no productive cough,  No non-productive cough,  No coughing up of blood.  No change in color of mucus.  No wheezing.  No chest wall deformity  Skin: no rash or lesions.  GU:  no dysuria, change in color of urine, no urgency or frequency.  No flank pain, no hematuria   MS:  No joint pain or swelling.  No decreased range of motion.  No back pain.    Physical Exam  BP 118/72 (BP Location: Left Arm, Cuff Size: Large)   Pulse 64   Ht 5\' 2"  (1.575 m)   Wt 225 lb 6.4 oz (102.2 kg)   SpO2 (!) 89%   BMI 41.23 kg/m   GEN: A/Ox3; pleasant , NAD, elderly in wc on O2    HEENT:  [place]/AT,  EACs-clear, TMs-wnl, NOSE-clear, THROAT-clear, no lesions, no postnasal drip or exudate noted.   NECK:  Supple w/ fair ROM; no JVD; normal carotid impulses w/o bruits; no thyromegaly or nodules palpated; no lymphadenopathy.    RESP  Decreased BS in bases ,  no accessory muscle use, no dullness to percussion  CARD:  RRR, no m/r/g, tr  peripheral edema, pulses intact, no cyanosis or clubbing.  GI:   Soft & nt; nml bowel sounds; no organomegaly or masses detected.   Musco: Warm bil, no deformities or joint swelling noted.   Neuro: alert, no focal deficits noted.    Skin: Warm, no lesions or rashes   Lab Results:   Imaging: Dg Chest 2 View  Result Date: 04/11/2017 CLINICAL DATA:  Shortness of breath, productive cough. EXAM: CHEST  2 VIEW COMPARISON:  Radiograph of January 30, 2017. FINDINGS: Stable mild cardiomegaly. No pneumothorax or pleural effusion is noted. New nodular opacity is noted in right upper lobe. Another nodular opacity is noted in the left lung apex. Bony thorax is unremarkable. IMPRESSION: Interval development of bilateral nodular opacities. CT scan of the chest is recommended to rule out pulmonary masses or metastatic disease. These results will be called to the ordering clinician or representative by the Radiologist Assistant, and communication documented in the PACS or zVision Dashboard. Electronically Signed   By: Marijo Conception, M.D.   On: 04/11/2017 12:51     Assessment & Plan:   OSA (obstructive sleep apnea) Improved compliance and adequate control of  OSA/OHS .  Improved clinically   Plan  Cont on BIPAP At bedtime  w/ O2  ONO results pending .   Chronic diastolic heart failure ([facility]) Improved on current regimen .  Cont same plan   COPD (chronic obstructive pulmonary disease) ([place]) Improved control  Cont on TRELEGY       Tammy Parrett, NP 04/13/2017

## 2017-04-13 NOTE — Telephone Encounter (Signed)
-----   Message from Sueanne Margarita, MD sent at 04/13/2017 12:47 PM EDT ----- Abnormal chest xray showing bilateral nodular opacities - please get a chest CT without contrast to rule out pulmonary mass or metastatic disease

## 2017-04-13 NOTE — Telephone Encounter (Signed)
Informed patient of results and verbal understanding expressed.  Instructed patient to INCREASE LASIX to 80 mg BID for 3 days, then take Lasix 80 mg qAM and 40 mg qPM. Chest CT ordered for scheduling. She will have BMET when she comes in for CT. She was grateful for call and agrees with treatment plan.

## 2017-04-13 NOTE — Telephone Encounter (Signed)
°  Follow Up   Pt is returning call regarding test results. Please call.

## 2017-04-16 NOTE — Telephone Encounter (Signed)
CT and BMET scheduled 5/18.

## 2017-04-18 DIAGNOSIS — R0902 Hypoxemia: Secondary | ICD-10-CM | POA: Diagnosis not present

## 2017-04-18 DIAGNOSIS — M899 Disorder of bone, unspecified: Secondary | ICD-10-CM | POA: Diagnosis not present

## 2017-04-18 DIAGNOSIS — J449 Chronic obstructive pulmonary disease, unspecified: Secondary | ICD-10-CM | POA: Diagnosis not present

## 2017-04-18 DIAGNOSIS — M47817 Spondylosis without myelopathy or radiculopathy, lumbosacral region: Secondary | ICD-10-CM | POA: Diagnosis not present

## 2017-04-20 ENCOUNTER — Other Ambulatory Visit: Payer: Medicare HMO | Admitting: *Deleted

## 2017-04-20 ENCOUNTER — Ambulatory Visit (INDEPENDENT_AMBULATORY_CARE_PROVIDER_SITE_OTHER)
Admission: RE | Admit: 2017-04-20 | Discharge: 2017-04-20 | Disposition: A | Discharge status: Home / Self Care | Payer: Medicare HMO | Source: Ambulatory Visit | Attending: Cardiology | Admitting: Cardiology

## 2017-04-20 ENCOUNTER — Telehealth: Payer: Self-pay

## 2017-04-20 DIAGNOSIS — R911 Solitary pulmonary nodule: Secondary | ICD-10-CM | POA: Diagnosis not present

## 2017-04-20 DIAGNOSIS — R938 Abnormal findings on diagnostic imaging of other specified body structures: Secondary | ICD-10-CM

## 2017-04-20 DIAGNOSIS — I5032 Chronic diastolic (congestive) heart failure: Secondary | ICD-10-CM

## 2017-04-20 DIAGNOSIS — I10 Essential (primary) hypertension: Secondary | ICD-10-CM

## 2017-04-20 DIAGNOSIS — R9389 Abnormal findings on diagnostic imaging of other specified body structures: Secondary | ICD-10-CM

## 2017-04-20 LAB — BASIC METABOLIC PANEL
BUN / CREAT RATIO: 10 — AB (ref 12–28)
BUN: 15 mg/dL (ref 8–27)
CO2: 43 mmol/L (ref 18–29)
CREATININE: 1.45 mg/dL — AB (ref 0.57–1.00)
Calcium: 8.9 mg/dL (ref 8.7–10.3)
Chloride: 84 mmol/L — ABNORMAL LOW (ref 96–106)
GFR calc Af Amer: 42 mL/min/{1.73_m2} — ABNORMAL LOW (ref >59)
GFR calc non Af Amer: 36 mL/min/{1.73_m2} — ABNORMAL LOW (ref >59)
GLUCOSE: 126 mg/dL — AB (ref 65–99)
Potassium: 3.3 mmol/L — ABNORMAL LOW (ref 3.5–5.2)
Sodium: 140 mmol/L (ref 134–144)

## 2017-04-20 MED ORDER — POTASSIUM CHLORIDE CRYS ER 20 MEQ PO TBCR: 20.0000 meq | EXTENDED_RELEASE_TABLET | Freq: Every day | ORAL | 3 refills | Status: DC

## 2017-04-20 NOTE — Telephone Encounter (Signed)
DOD, Dr. Meda Coffee recommends patient to take potassium 20 meq by mouth daily due to her recent lab results.. Called patient to let her know that she will need to take potassium 20 meq by mouth. Patient stated she does have some potassium pills. Asked patient what dose she is taking. Patient stated hold on and put the phone down. While waiting for patient to come back to the phone it sounded like patient fell on the floor and family was trying to help patient. Family hung up phone. Tried to call back, no answer, left message to call back.

## 2017-04-23 ENCOUNTER — Telehealth: Payer: Self-pay | Admitting: Pulmonary Disease

## 2017-04-23 NOTE — Telephone Encounter (Signed)
   Patient was recently seen by Dr. Radford Pax, Cardiology. Patient complained of more dyspnea than baseline. Chest x-ray was ordered. Chest x-ray done first week of May showed possible nodules suspicious for metastases. Dr. Radford Pax did order chest CT scan which showed bilateral pulmonary metastasis. Liver lesions consistent with liver metastases, possible primary hepatocellular cancer.  Dr. Radford Pax wanted pulmonary to assess the nodules and determine how workup should proceed.   Jasmine :  Can you pls schedule the pt to be seen by any one of [place] this week per Dr. Theodosia Blender request?  Thanks. Pt does NOT know the cancer diagnosis yet. Thanks. Please let me know who will see her and when.  Monica Becton, MD 04/23/2017, 7:01 PM [facility] Pager [phone] After 3 pm or if no answer, call [phone]

## 2017-04-23 NOTE — Telephone Encounter (Signed)
Let message to call back.

## 2017-04-24 ENCOUNTER — Encounter: Payer: Self-pay | Admitting: Pulmonary Disease

## 2017-04-24 ENCOUNTER — Telehealth: Payer: Self-pay | Admitting: Pulmonary Disease

## 2017-04-24 MED ORDER — POTASSIUM CHLORIDE CRYS ER 20 MEQ PO TBCR: 20.0000 meq | EXTENDED_RELEASE_TABLET | Freq: Every day | ORAL | 3 refills | Status: AC

## 2017-04-24 NOTE — Telephone Encounter (Signed)
Pt son is going to keep appt as scheduled for 04/25/17 at 9:45a He was wanting to change this d/t schedule complications but per the telephone message from AD, pt needs to be seen this week. Son is states that he will keep it as is. Nothing further needed.

## 2017-04-24 NOTE — Telephone Encounter (Signed)
-----   Message from Sueanne Margarita, MD sent at 04/23/2017 12:14 PM EDT ----- Renal function improved.  Has chronically elevated CO2.  Please find out if anyone from our office ever contacted her about her potassium. See note in chart about discussion with patient and then the phone was dropped and not sure patient ever got the message about potassium suppl per Dr. Meda Coffee

## 2017-04-24 NOTE — Telephone Encounter (Signed)
Called to check on patient and give lab results. At time of call last week, patient did indeed fall. Her son called EMS and she was checked out. She had no breaks , only bruising.  Informed patient of results and verbal understanding expressed.  Instructed patient to START KDUR 20 meq daily BMET scheduled 5/30. Patient agrees with treatment plan.

## 2017-04-24 NOTE — Telephone Encounter (Signed)
AD  She has been scheduled to see you tomorrow at 945am.

## 2017-04-25 ENCOUNTER — Encounter: Payer: Self-pay | Admitting: Pulmonary Disease

## 2017-04-25 ENCOUNTER — Ambulatory Visit (INDEPENDENT_AMBULATORY_CARE_PROVIDER_SITE_OTHER): Payer: Medicare HMO | Admitting: Pulmonary Disease

## 2017-04-25 VITALS — BP 118/78 | HR 71 | Ht 62.0 in | Wt 216.0 lb

## 2017-04-25 DIAGNOSIS — Z9181 History of falling: Secondary | ICD-10-CM | POA: Diagnosis not present

## 2017-04-25 DIAGNOSIS — R918 Other nonspecific abnormal finding of lung field: Secondary | ICD-10-CM | POA: Insufficient documentation

## 2017-04-25 DIAGNOSIS — C229 Malignant neoplasm of liver, not specified as primary or secondary: Secondary | ICD-10-CM | POA: Diagnosis not present

## 2017-04-25 DIAGNOSIS — J9601 Acute respiratory failure with hypoxia: Secondary | ICD-10-CM | POA: Diagnosis not present

## 2017-04-25 DIAGNOSIS — R0902 Hypoxemia: Secondary | ICD-10-CM

## 2017-04-25 DIAGNOSIS — M6281 Muscle weakness (generalized): Secondary | ICD-10-CM | POA: Diagnosis not present

## 2017-04-25 DIAGNOSIS — I5042 Chronic combined systolic (congestive) and diastolic (congestive) heart failure: Secondary | ICD-10-CM | POA: Diagnosis not present

## 2017-04-25 DIAGNOSIS — G4733 Obstructive sleep apnea (adult) (pediatric): Secondary | ICD-10-CM

## 2017-04-25 DIAGNOSIS — J439 Emphysema, unspecified: Secondary | ICD-10-CM

## 2017-04-25 DIAGNOSIS — R911 Solitary pulmonary nodule: Secondary | ICD-10-CM

## 2017-04-25 DIAGNOSIS — R160 Hepatomegaly, not elsewhere classified: Secondary | ICD-10-CM | POA: Diagnosis not present

## 2017-04-25 NOTE — Assessment & Plan Note (Signed)
Patient with chest x-ray recently showing pulmonary nodules. Chest CT scan showed several nodules bilaterally peripherally concerning for pulmonary metastasis. Chest CT scan showed liver mass/masses concerning for primary liver cancer.  I extensively discussed the CAT scan report as well as images with the patient and her 2 boys. 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  We will facilitate PET scan. I'm not sure if patient wants chemotherapy, if ever palliative. She may not tolerated given her significant comorbidities. If she does not want treatment, suggest to hold off on biopsy as she may have more complications.  Told patient and sons to schedule follow-up with primary care doctor.  Lung nodules are too small and peripheral to be biopsied.

## 2017-04-25 NOTE — Assessment & Plan Note (Signed)
Seems stable.  Cont Trelegy, 1 puff daily.  Cont Duoneb qid prn for now

## 2017-04-25 NOTE — Patient Instructions (Signed)
  It was a pleasure taking care of you today!  Continue using your BiPAP machine.  We will schedule you for a PET scan.  Please make sure you follow up with her regular doctor soon.  Return to clinic in July  As scheduled.

## 2017-04-25 NOTE — Progress Notes (Signed)
Subjective:    Patient ID: Amanda Horton, female    DOB: 1946/09/16, 71 y.o.   MRN: [id number]  HPI  ROV 02/20/17 {t returns to the office as follow up.  Pt has a 67 PY smoking history, quit in 2014.  Pt was diagnosed in 2008 with COPD. She  is using advair 500/50 1 puff 2x/day, Spiriva capsule daily, duoneb 4x/day.  Pt has been on O2 since she was 71 yrs old. She has been on 2L O2 24/7, rest and walking. She has a POC and O2 tanks. [employer] is her DME.  Patient has OSA, unsure when the diagnosis was made. She is currently on a Bipap set at 20/6 cm water.  Machine is 3-4 yrs old. Has o2 2-3 L bled into machine. Last sleep study was in 2013.   PFT (03/2014), done at [facility],  FEV1/FVC  77%,  FEV1  0.58  26%. No FLOOP seen.  CXR (01/31/17)  RVD, Cardiomegaly  Patient follows up in the office for her chronic hypoxemic respiratory failure. She was recently admitted for a couple of times for acute and chronic respiratory failure (Jan, February, March). Last admission was start of March 2018.  She was treated with diuretics + prednisone + Antibiotics.   Pt has a BiPAP machine which was reset at 20/6cm water. She had some issues with her BiPAP machine for which the son ended up touching base with me and her DME company. With her recent discharge last February, when she used her BiPAP machine, her oxygen level was dropping despite 2 L oxygen. There was not enough pressure coming off from the BiPAP machine. They called [employer] and they were able to "fix the machine" by adjusting the pressure. The Bipap worked for less than a month but the last 3 days or so, machine is not delivering enough pressure and is malfunctioning again.  Pt's O2 sats are starting to drop again while on 2L O2 and her current bipap machine.   Download the last month: 93%, AHI 2.6.   ROV 04/25/2017 Patient returns to the office urgently after chest CT scan was done. She saw her cardiologist recently and she complained of more dyspnea than  baseline. Chest x-ray was done which showed nodules suspicious for metastasis. Cardiologist ordered a chest CT scan which showed several pulmonary nodules concerning for pulmonary metastasis. It also showed liver mass/mass is concerning for primary hepatic liver cancer. Pt tries to wear her bIpap.  Has on and off confusion.   Review of Systems  Constitutional: Positive for fatigue.       On and off confusion  HENT: Negative.   Eyes: Negative.   Respiratory: Positive for cough and shortness of breath.   Cardiovascular: Positive for leg swelling.  Gastrointestinal: Negative.   Endocrine: Negative.   Genitourinary: Negative.   Musculoskeletal: Negative.   Skin: Negative.   Allergic/Immunologic: Negative.   Neurological: Negative.   Hematological: Negative.   Psychiatric/Behavioral: Negative.   All other systems reviewed and are negative.      Objective:   Physical Exam Vitals:  Vitals:   04/25/17 0945  BP: 118/78  Pulse: 71  SpO2: 90%  Weight: 216 lb (98 kg)  Height: 5\' 2"  (1.575 m)    Constitutional/General:  Morbidly obese, not in any distress,  Comfortably seating on her wheelchair.  Well kempt  Body mass index is 39.51 kg/m. Wt Readings from Last 3 Encounters:  04/25/17 216 lb (98 kg)  04/13/17 225 lb 6.4 oz (102.2 kg)  04/11/17 227 lb 8 oz (103.2 kg)     HEENT: Pupils equal and reactive to light and accommodation. Anicteric sclerae. Normal nasal mucosa.   No oral  lesions,  mouth clear,  oropharynx clear, no postnasal drip. (-) Oral thrush. No dental caries.  Airway - Mallampati class III-IV  Neck: No masses. Midline trachea. No JVD, (-) LAD. (-) bruits appreciated.  Respiratory/Chest: Grossly normal chest. (-) deformity. (-) Accessory muscle use.  Symmetric expansion. (-) Tenderness on palpation.  Resonant on percussion.  Diminished BS on both lower lung zones. (-)rhonchi Some wheezing in upper lung zones Crackles at bases.  (-)  egophony  Cardiovascular: Regular rate and  rhythm, heart sounds normal, no murmur or gallops,Gr 2  peripheral edema  Gastrointestinal:  Normal bowel sounds. Soft, non-tender. No hepatosplenomegaly.  (-) masses.   Musculoskeletal:  Normal muscle tone. Unable to examine gain as she was sitting on her wheelchair  Extremities: Grossly normal. (-) clubbing, cyanosis.  Gr 2 edema  Skin: (-) rash,lesions seen.   Neurological/Psychiatric : alert, oriented to time, place, person. Normal mood and affect           Assessment & Plan:  Pulmonary nodules Patient with chest x-ray recently showing pulmonary nodules. Chest CT scan showed several nodules bilaterally peripherally concerning for pulmonary metastasis. Chest CT scan showed liver mass/masses concerning for primary liver cancer.  I extensively discussed the CAT scan report as well as images with the patient and her 2 boys. 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  We will facilitate PET scan. I'm not sure if patient wants chemotherapy, if ever palliative. She may not tolerated given her significant comorbidities. If she does not want treatment, suggest to hold off on biopsy as she may have more complications.  Told patient and sons to schedule follow-up with primary care doctor.  Lung nodules are too small and peripheral to be biopsied.  COPD (chronic obstructive pulmonary disease) (HCC) Seems stable.  Cont Trelegy, 1 puff daily.  Cont Duoneb qid prn for now    OSA (obstructive sleep apnea) Cont Bipap 20/6 with 2 L o2.  Bipap machine working well.  Need to f/u on ONO  Hypoxia Patient has retain CO2. Currently on 2 L oxygen, 24/7. May increase to 3 L to keep O2 saturation more than 88%.   Return to clinic in July as scheduled.     Monica Becton, MD 04/25/2017, 10:27 AM [facility] Pager [phone] After 3 pm or if no answer, call  [phone]

## 2017-04-25 NOTE — Assessment & Plan Note (Signed)
Patient has retain CO2. Currently on 2 L oxygen, 24/7. May increase to 3 L to keep O2 saturation more than 88%.

## 2017-04-25 NOTE — Progress Notes (Signed)
Pt came in on 2L pulse 80%. Pt was bumped up to 3L pulse 90%. Per AD pt can monitor her oxygen on 2L and if needed bump up to 3L

## 2017-04-25 NOTE — Assessment & Plan Note (Signed)
Cont Bipap 20/6 with 2 L o2.  Bipap machine working well.  Need to f/u on ONO

## 2017-05-02 ENCOUNTER — Other Ambulatory Visit (INDEPENDENT_AMBULATORY_CARE_PROVIDER_SITE_OTHER): Payer: Medicare HMO | Admitting: *Deleted

## 2017-05-02 DIAGNOSIS — I10 Essential (primary) hypertension: Secondary | ICD-10-CM

## 2017-05-03 LAB — BASIC METABOLIC PANEL
BUN/Creatinine Ratio: 10 — ABNORMAL LOW (ref 12–28)
BUN: 19 mg/dL (ref 8–27)
CALCIUM: 8.8 mg/dL (ref 8.7–10.3)
CHLORIDE: 83 mmol/L — AB (ref 96–106)
CO2: 35 mmol/L — AB (ref 18–29)
Creatinine, Ser: 1.82 mg/dL — ABNORMAL HIGH (ref 0.57–1.00)
GFR calc non Af Amer: 28 mL/min/{1.73_m2} — ABNORMAL LOW (ref >59)
GFR, EST AFRICAN AMERICAN: 32 mL/min/{1.73_m2} — AB (ref >59)
Glucose: 119 mg/dL — ABNORMAL HIGH (ref 65–99)
POTASSIUM: 4.3 mmol/L (ref 3.5–5.2)
Sodium: 134 mmol/L (ref 134–144)

## 2017-05-04 ENCOUNTER — Ambulatory Visit (HOSPITAL_COMMUNITY)
Admission: RE | Admit: 2017-05-04 | Discharge: 2017-05-04 | Disposition: A | Discharge status: Home / Self Care | Payer: Medicare HMO | Source: Ambulatory Visit | Attending: Pulmonary Disease | Admitting: Pulmonary Disease

## 2017-05-04 ENCOUNTER — Telehealth: Payer: Self-pay | Admitting: Pulmonary Disease

## 2017-05-04 DIAGNOSIS — C7951 Secondary malignant neoplasm of bone: Secondary | ICD-10-CM | POA: Insufficient documentation

## 2017-05-04 DIAGNOSIS — C7801 Secondary malignant neoplasm of right lung: Secondary | ICD-10-CM | POA: Insufficient documentation

## 2017-05-04 DIAGNOSIS — K769 Liver disease, unspecified: Secondary | ICD-10-CM | POA: Diagnosis present

## 2017-05-04 DIAGNOSIS — C7802 Secondary malignant neoplasm of left lung: Secondary | ICD-10-CM | POA: Diagnosis not present

## 2017-05-04 DIAGNOSIS — C787 Secondary malignant neoplasm of liver and intrahepatic bile duct: Secondary | ICD-10-CM | POA: Insufficient documentation

## 2017-05-04 DIAGNOSIS — C801 Malignant (primary) neoplasm, unspecified: Secondary | ICD-10-CM | POA: Insufficient documentation

## 2017-05-04 DIAGNOSIS — R918 Other nonspecific abnormal finding of lung field: Secondary | ICD-10-CM | POA: Diagnosis not present

## 2017-05-04 DIAGNOSIS — C229 Malignant neoplasm of liver, not specified as primary or secondary: Secondary | ICD-10-CM

## 2017-05-04 DIAGNOSIS — N2889 Other specified disorders of kidney and ureter: Secondary | ICD-10-CM | POA: Insufficient documentation

## 2017-05-04 DIAGNOSIS — R911 Solitary pulmonary nodule: Secondary | ICD-10-CM

## 2017-05-04 DIAGNOSIS — R160 Hepatomegaly, not elsewhere classified: Secondary | ICD-10-CM

## 2017-05-04 LAB — GLUCOSE, CAPILLARY: GLUCOSE-CAPILLARY: 124 mg/dL — AB (ref 65–99)

## 2017-05-04 MED ORDER — FLUDEOXYGLUCOSE F - 18 (FDG) INJECTION
10.7800 | Freq: Once | INTRAVENOUS | Status: AC | PRN
  Administered 2017-05-04: 10.78 via INTRAVENOUS

## 2017-05-04 NOTE — Telephone Encounter (Signed)
Received call from [facility] with [facility] - call report for PET scan that was performed on 05/04/17. Impression is below.   IMPRESSION: 1. 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 Recommend urology consultation. 2. Intensely hypermetabolic liver metastasis within both hepatic lobes. 3. Bilateral multiple intensely hypermetabolic pulmonary metastasis. 4. Multiple foci of hypermetabolic skeletal metastasis. 5. Potential biopsy sites could include the liver and lumbar spine. The pelvic lesions also potentially but lesions are difficult to define on the CT portion exam. These results will be called to the ordering clinician or representative by the Radiologist Assistant, and communication documented in the PACS or zVision Dashboard.   Electronically Signed   By: Suzy Bouchard M.D.   On: 05/04/2017 12:52    Dr. Corrie Dandy please advise. Thanks.

## 2017-05-04 NOTE — Telephone Encounter (Signed)
Patient's son is calling back to further discuss PET results.  AD please advise after you've spoken to patient.  Thanks.

## 2017-05-04 NOTE — Telephone Encounter (Signed)
Amanda Horton, patient's other son, said Dr. Corrie Dandy called after the the message I put in today at 3:07 pm.  He is returning his call.  [phone] Cecilie Lowers) or [phone] Amanda Horton), Amanda Horton, pt daughter, is [phone].

## 2017-05-04 NOTE — Telephone Encounter (Signed)
   PET scan done today showed possible right kidney/UJ system as a source of cancer with metastasis to the liver and lungs.  I called patient regarding results. When he saw her the last time, she was not necessarily sure what she wanted to do if she had cancer. During the last time I saw her, her 2 sons were with her and I discussed with her cancer diagnosis. I tried calling both sons today but they did not pick up. I left a voicemail for them to call back.  I'm not sure if patient wants to pursue treatment for cancer as that was my impression when I saw her the last time.   Jasmine :  1. Can you pls  try calling patient's son [name] or Cecilie Lowers and tell them about results of the PET scan ? 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  2.  Can you pls send the PET scan report to the PCP Dr. [name] in [place]?  I do not think he is in epic. I sent him a note previously and I am not sure if he got my note.  Can we try if PCP can see pt next week?  I also tried to let the sons make a follow-up appointment with her to see her primary care doctor.  Thanks a bunch!   J. Shirl Harris, MD 05/04/2017, 2:58 PM [facility] Pager [phone] After 3 pm or if no answer, call [phone]

## 2017-05-04 NOTE — Telephone Encounter (Signed)
Cecilie Lowers, patient's son, is returning call about PET scan results.  CB is [phone].

## 2017-05-04 NOTE — Telephone Encounter (Signed)
   I made a separate Epic encounter to address this result.  Monica Becton, MD 05/04/2017, 2:58 PM [facility] Pager [phone] After 3 pm or if no answer, call [phone]

## 2017-05-07 NOTE — Telephone Encounter (Signed)
Patient's son, Valla Leaver [phone] or [phone], is returning Dr. Corrie Dandy' call regarding his mother. States he tried to call him on Friday and also his other brother as well.  There are a few phone messages open for this patient.

## 2017-05-07 NOTE — Telephone Encounter (Signed)
Spoke with patient's son regarding results. He verbalized understanding. Stated that he will try to get an appt with Dr. [name] within the next week. I will fax a copy of the PET scan as well as the last OV with [facility] since [place] stated he wasn't sure if he received it due to not being in [place]. [name] verbalized understanding and had no further questions.

## 2017-05-08 ENCOUNTER — Telehealth: Payer: Self-pay

## 2017-05-08 DIAGNOSIS — R0602 Shortness of breath: Secondary | ICD-10-CM

## 2017-05-08 MED ORDER — FUROSEMIDE 40 MG PO TABS: 40.0000 mg | ORAL_TABLET | Freq: Two times a day (BID) | ORAL | 11 refills | Status: AC

## 2017-05-08 NOTE — Telephone Encounter (Signed)
Attempted to call [name] back was unable to leave a vm, please see AD's previous message below as to what he wanted to discuss with son

## 2017-05-08 NOTE — Telephone Encounter (Signed)
-----   Message from Sueanne Margarita, MD sent at 05/05/2017  9:44 PM EDT ----- Potassium level now normal.  Creatinine bumped.  Her abnormal lung sounds on exam at last OV likely from underlying lung disease as Chest CT did not show any edema and did show multiple pulmonary nodules that are being worked up by pulmonary and likely metastatic disease.  I would like her to decrease her Lasix to 40mg  BID and repeat BMET and BNP in 1 week

## 2017-05-08 NOTE — Telephone Encounter (Signed)
Pet Scan report faxed to Dr. [name]. Will call son to  that pt gets an appt with [facility]

## 2017-05-08 NOTE — Telephone Encounter (Signed)
Informed patient of results and verbal understanding expressed.  Instructed patient to decrease Lasix to 40 mg BID. BNP and BMET scheduled 6/15. Patient agrees with treatment plan.

## 2017-05-09 DIAGNOSIS — C7801 Secondary malignant neoplasm of right lung: Secondary | ICD-10-CM | POA: Diagnosis not present

## 2017-05-09 DIAGNOSIS — C787 Secondary malignant neoplasm of liver and intrahepatic bile duct: Secondary | ICD-10-CM | POA: Diagnosis not present

## 2017-05-09 DIAGNOSIS — R7301 Impaired fasting glucose: Secondary | ICD-10-CM | POA: Diagnosis not present

## 2017-05-09 DIAGNOSIS — C689 Malignant neoplasm of urinary organ, unspecified: Secondary | ICD-10-CM | POA: Diagnosis not present

## 2017-05-09 DIAGNOSIS — I5032 Chronic diastolic (congestive) heart failure: Secondary | ICD-10-CM | POA: Diagnosis not present

## 2017-05-09 DIAGNOSIS — C7951 Secondary malignant neoplasm of bone: Secondary | ICD-10-CM | POA: Diagnosis not present

## 2017-05-09 DIAGNOSIS — D638 Anemia in other chronic diseases classified elsewhere: Secondary | ICD-10-CM | POA: Diagnosis not present

## 2017-05-09 DIAGNOSIS — J449 Chronic obstructive pulmonary disease, unspecified: Secondary | ICD-10-CM | POA: Diagnosis not present

## 2017-05-09 DIAGNOSIS — E559 Vitamin D deficiency, unspecified: Secondary | ICD-10-CM | POA: Diagnosis not present

## 2017-05-09 DIAGNOSIS — I129 Hypertensive chronic kidney disease with stage 1 through stage 4 chronic kidney disease, or unspecified chronic kidney disease: Secondary | ICD-10-CM | POA: Diagnosis not present

## 2017-05-09 DIAGNOSIS — N1330 Unspecified hydronephrosis: Secondary | ICD-10-CM | POA: Diagnosis not present

## 2017-05-10 NOTE — Telephone Encounter (Signed)
AD  I spoke with Amanda Horton and he stated they went and saw Dr. [name] yesterday. He stated they were informed that she has cancer of the lungs,liver and kidney. He did not have any further questions.

## 2017-05-10 NOTE — Telephone Encounter (Signed)
OK.  Nothing further needed then ?  PCP is taking care of PET scan results and work up.   Thanks.   Monica Becton, MD 05/10/2017, 9:30 PM [facility] Pager [phone] After 3 pm or if no answer, call [phone]

## 2017-05-15 NOTE — Progress Notes (Signed)
Reviewed & agree with plan  

## 2017-05-16 DIAGNOSIS — N131 Hydronephrosis with ureteral stricture, not elsewhere classified: Secondary | ICD-10-CM | POA: Diagnosis not present

## 2017-05-16 DIAGNOSIS — C661 Malignant neoplasm of right ureter: Secondary | ICD-10-CM | POA: Diagnosis not present

## 2017-05-18 ENCOUNTER — Other Ambulatory Visit: Payer: Medicare HMO

## 2017-05-19 DIAGNOSIS — M47817 Spondylosis without myelopathy or radiculopathy, lumbosacral region: Secondary | ICD-10-CM | POA: Diagnosis not present

## 2017-05-19 DIAGNOSIS — J449 Chronic obstructive pulmonary disease, unspecified: Secondary | ICD-10-CM | POA: Diagnosis not present

## 2017-05-19 DIAGNOSIS — M899 Disorder of bone, unspecified: Secondary | ICD-10-CM | POA: Diagnosis not present

## 2017-05-19 DIAGNOSIS — R0902 Hypoxemia: Secondary | ICD-10-CM | POA: Diagnosis not present

## 2017-05-20 ENCOUNTER — Emergency Department (HOSPITAL_COMMUNITY)
Admission: EM | Admit: 2017-05-20 | Discharge: 2017-05-20 | Disposition: A | Discharge status: Home / Self Care | Payer: Medicare HMO | Attending: Emergency Medicine | Admitting: Emergency Medicine

## 2017-05-20 ENCOUNTER — Emergency Department (HOSPITAL_COMMUNITY): Payer: Medicare HMO

## 2017-05-20 DIAGNOSIS — I129 Hypertensive chronic kidney disease with stage 1 through stage 4 chronic kidney disease, or unspecified chronic kidney disease: Secondary | ICD-10-CM | POA: Insufficient documentation

## 2017-05-20 DIAGNOSIS — J80 Acute respiratory distress syndrome: Secondary | ICD-10-CM | POA: Diagnosis not present

## 2017-05-20 DIAGNOSIS — R0602 Shortness of breath: Secondary | ICD-10-CM | POA: Diagnosis not present

## 2017-05-20 DIAGNOSIS — N183 Chronic kidney disease, stage 3 unspecified: Secondary | ICD-10-CM

## 2017-05-20 DIAGNOSIS — C7800 Secondary malignant neoplasm of unspecified lung: Secondary | ICD-10-CM | POA: Diagnosis not present

## 2017-05-20 DIAGNOSIS — D649 Anemia, unspecified: Secondary | ICD-10-CM | POA: Diagnosis not present

## 2017-05-20 DIAGNOSIS — Z87891 Personal history of nicotine dependence: Secondary | ICD-10-CM | POA: Insufficient documentation

## 2017-05-20 DIAGNOSIS — R4182 Altered mental status, unspecified: Secondary | ICD-10-CM | POA: Diagnosis not present

## 2017-05-20 DIAGNOSIS — Z7982 Long term (current) use of aspirin: Secondary | ICD-10-CM | POA: Insufficient documentation

## 2017-05-20 DIAGNOSIS — C801 Malignant (primary) neoplasm, unspecified: Secondary | ICD-10-CM | POA: Diagnosis not present

## 2017-05-20 DIAGNOSIS — Z79899 Other long term (current) drug therapy: Secondary | ICD-10-CM | POA: Diagnosis not present

## 2017-05-20 DIAGNOSIS — J441 Chronic obstructive pulmonary disease with (acute) exacerbation: Secondary | ICD-10-CM | POA: Insufficient documentation

## 2017-05-20 DIAGNOSIS — R069 Unspecified abnormalities of breathing: Secondary | ICD-10-CM | POA: Diagnosis not present

## 2017-05-20 LAB — URINALYSIS, ROUTINE W REFLEX MICROSCOPIC
BILIRUBIN URINE: NEGATIVE
Glucose, UA: NEGATIVE mg/dL
Ketones, ur: NEGATIVE mg/dL
Leukocytes, UA: NEGATIVE
NITRITE: NEGATIVE
PH: 5.5 (ref 5.0–8.0)
Protein, ur: NEGATIVE mg/dL
SPECIFIC GRAVITY, URINE: 1.015 (ref 1.005–1.030)

## 2017-05-20 LAB — COMPREHENSIVE METABOLIC PANEL
ALBUMIN: 2.3 g/dL — AB (ref 3.5–5.0)
ALK PHOS: 106 U/L (ref 38–126)
ALT: 25 U/L (ref 14–54)
AST: 46 U/L — AB (ref 15–41)
Anion gap: 12 (ref 5–15)
BILIRUBIN TOTAL: 0.3 mg/dL (ref 0.3–1.2)
BUN: 18 mg/dL (ref 6–20)
CO2: 36 mmol/L — AB (ref 22–32)
Calcium: 8.8 mg/dL — ABNORMAL LOW (ref 8.9–10.3)
Chloride: 92 mmol/L — ABNORMAL LOW (ref 101–111)
Creatinine, Ser: 1.55 mg/dL — ABNORMAL HIGH (ref 0.44–1.00)
GFR calc Af Amer: 38 mL/min — ABNORMAL LOW (ref >60)
GFR calc non Af Amer: 33 mL/min — ABNORMAL LOW (ref >60)
GLUCOSE: 127 mg/dL — AB (ref 65–99)
POTASSIUM: 3.9 mmol/L (ref 3.5–5.1)
SODIUM: 140 mmol/L (ref 135–145)
TOTAL PROTEIN: 7.8 g/dL (ref 6.5–8.1)

## 2017-05-20 LAB — BRAIN NATRIURETIC PEPTIDE: B Natriuretic Peptide: 148.4 pg/mL — ABNORMAL HIGH (ref 0.0–100.0)

## 2017-05-20 LAB — BLOOD GAS, VENOUS
Acid-Base Excess: 13.5 mmol/L — ABNORMAL HIGH (ref 0.0–2.0)
BICARBONATE: 41.1 mmol/L — AB (ref 20.0–28.0)
O2 CONTENT: 2 L/min
O2 Saturation: 62.7 %
PATIENT TEMPERATURE: 98.6
PH VEN: 7.343 (ref 7.250–7.430)
pCO2, Ven: 77.8 mmHg (ref 44.0–60.0)
pO2, Ven: 38.1 mmHg (ref 32.0–45.0)

## 2017-05-20 LAB — URINALYSIS, MICROSCOPIC (REFLEX)
BACTERIA UA: NONE SEEN
WBC UA: NONE SEEN WBC/hpf (ref 0–5)

## 2017-05-20 LAB — CBC WITH DIFFERENTIAL/PLATELET
BASOS ABS: 0 10*3/uL (ref 0.0–0.1)
BASOS PCT: 0 %
Eosinophils Absolute: 0 10*3/uL (ref 0.0–0.7)
Eosinophils Relative: 0 %
HEMATOCRIT: 30.8 % — AB (ref 36.0–46.0)
HEMOGLOBIN: 8.8 g/dL — AB (ref 12.0–15.0)
Lymphocytes Relative: 14 %
Lymphs Abs: 1.5 10*3/uL (ref 0.7–4.0)
MCH: 22.7 pg — ABNORMAL LOW (ref 26.0–34.0)
MCHC: 28.6 g/dL — ABNORMAL LOW (ref 30.0–36.0)
MCV: 79.4 fL (ref 78.0–100.0)
Monocytes Absolute: 1.4 10*3/uL — ABNORMAL HIGH (ref 0.1–1.0)
Monocytes Relative: 13 %
NEUTROS ABS: 8.3 10*3/uL — AB (ref 1.7–7.7)
NEUTROS PCT: 73 %
Platelets: 541 10*3/uL — ABNORMAL HIGH (ref 150–400)
RBC: 3.88 MIL/uL (ref 3.87–5.11)
RDW: 18.7 % — AB (ref 11.5–15.5)
WBC: 11.2 10*3/uL — ABNORMAL HIGH (ref 4.0–10.5)

## 2017-05-20 LAB — TROPONIN I: Troponin I: <0.03 ng/mL (ref <0.03)

## 2017-05-20 MED ORDER — METHYLPREDNISOLONE SODIUM SUCC 125 MG IJ SOLR
125.0000 mg | Freq: Once | INTRAMUSCULAR | Status: AC
  Administered 2017-05-20: 125 mg via INTRAVENOUS
  Filled 2017-05-20: qty 2

## 2017-05-20 MED ORDER — ALBUTEROL SULFATE (2.5 MG/3ML) 0.083% IN NEBU
5.0000 mg | INHALATION_SOLUTION | Freq: Once | RESPIRATORY_TRACT | Status: AC
  Administered 2017-05-20: 5 mg via RESPIRATORY_TRACT
  Filled 2017-05-20: qty 6

## 2017-05-20 NOTE — Discharge Instructions (Signed)
Wear your bipap

## 2017-05-20 NOTE — ED Notes (Signed)
Bed: WA07 Expected date:  Expected time:  Means of arrival:  Comments: EMS short of breath

## 2017-05-20 NOTE — ED Triage Notes (Signed)
Pt from home via [facility] with c/o confusion and SOB. Pt is supposed to wear bipap but per family, they can only get her to wear it about 20 min a day. Family states pt gets confused when she does not use bipap. Pt is alert and oriented x 3 (not to date). Pt is 94% on [place] and has clear bilateral lung sounds

## 2017-05-20 NOTE — ED Provider Notes (Signed)
[facility] DEPT Provider Note   CSN: [id number] Arrival date & time: 05/20/17  1402     History   Chief Complaint Chief Complaint  Patient presents with  . Shortness of Breath  . Altered Mental Status    HPI Cintya Daughety is a 71 y.o. female.  Pt presents to the ED today with sob and confusion.  Pt has a hx of COPD and is supposed to wear bipap at night.  She does wear O2 at 2L.  Per EMS, she has not been compliant with it and family said she's more confused.  The pt denies any sx.  Pt's family has not yet arrived to discuss concerns.  Per chart, pt was recently diagnosed with metastatic cancer.      Past Medical History:  Diagnosis Date  . Allergic rhinitis   . Anemia due to chronic kidney disease   . APC (atrial premature contractions)   . 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 Benign essential hypertension   . Borderline diabetes   . Chronic insomnia   . Chronic venous insufficiency   . CKD (chronic kidney disease), stage III   . COPD (chronic obstructive pulmonary disease) ([place])   . GERD (gastroesophageal reflux disease)   . Hyperlipemia   . Hypoxia   . Lumbar disc narrowing   . On home oxygen therapy   . OSA (obstructive sleep apnea)   . Osteoporosis   . Respiratory failure requiring intubation ([place])   . Spinal stenosis of lumbar region without neurogenic claudication   . Varicose veins with pain   . Vitamin B12 deficiency   . Vitamin D deficiency     Patient Active Problem List   Diagnosis Date Noted  . Pulmonary nodules 04/25/2017  . COPD (chronic obstructive pulmonary disease) ([place]) 02/20/2017  . Obesity hypoventilation syndrome ([place]) 02/20/2017  . Morbid obesity ([place]) 02/20/2017  . Hypoxia   . Stasis ulcer ([place])   . Shortness of breath   . Elevated troponin   . Anemia of chronic kidney failure, stage 4 (severe) ([place])   . Venous stasis ulcer limited to breakdown of skin with varicose veins ([place])   . COPD exacerbation ([place]) 12/03/2016  . Sepsis  due to pneumonia ([place]) 12/03/2016  . Leukocytosis 12/03/2016  . Troponin level elevated 12/03/2016  . Chronic diastolic heart failure ([place]) 12/03/2016  . Hypertensive heart and chronic kidney disease with heart failure and stage 1 through stage 4 chronic kidney disease, or chronic kidney disease ([place]) 12/03/2016  . Anemia of chronic kidney failure 12/03/2016  . Metabolic alkalosis 25/36/6440  . Anxiety and depression 12/03/2016  . Essential hypertension 08/27/2016  . GERD (gastroesophageal reflux disease)   . OSA (obstructive sleep apnea)     Past Surgical History:  Procedure Laterality Date  . APPLICATION OF A-CELL OF EXTREMITY Right 08/30/2016   Procedure: APPLICATION OF A-CELL OF EXTREMITY;  Surgeon: Loel Lofty Dillingham, DO;  Location: [facility];  Service: Plastics;  Laterality: Right;  . APPLICATION OF A-CELL OF EXTREMITY Right 09/20/2016   Procedure: APPLICATION OF A-CELL OF right lower leg wound;  Surgeon: Loel Lofty Dillingham, DO;  Location: [facility];  Service: Plastics;  Laterality: Right;  . APPLICATION OF WOUND VAC Right 08/30/2016   Procedure: APPLICATION OF WOUND VAC;  Surgeon: Loel Lofty Dillingham, DO;  Location: [facility];  Service: Plastics;  Laterality: Right;  . APPLICATION OF WOUND VAC Right 09/20/2016   Procedure: APPLICATION OF WOUND VAC right lower leg wound;  Surgeon: Loel Lofty  Dillingham, DO;  Location[place];  Service: Plastics;  Laterality: Right;  . I&D EXTREMITY Right 08/30/2016   Procedure: IRRIGATION AND DEBRIDEMENT EXTREMITY RIGHT;  Surgeon: Loel Lofty Dillingham, DO;  Location: [place];  Service: Plastics;  Laterality: Right;  . INCISION AND DRAINAGE OF WOUND Right 09/20/2016   Procedure: IRRIGATION AND DEBRIDEMENT right lower leg wound;  Surgeon: Loel Lofty Dillingham, DO;  Location: [place];  Service: Plastics;  Laterality: Right;  . PARTIAL HYSTERECTOMY      OB History    No data available       Home Medications    Prior to Admission medications   Medication Sig Start  Date End Date Taking? Authorizing Provider  albuterol (PROVENTIL) (2.5 MG/3ML) 0.083% nebulizer solution Take 3 mLs (2.5 mg total) by nebulization every 4 (four) hours as needed for wheezing or shortness of breath. Patient taking differently: Take 2.5 mg by nebulization every 6 (six) hours as needed for wheezing or shortness of breath.  02/02/17 05/20/17 Yes Arrien, Jimmy Picket, MD  alendronate (FOSAMAX) 70 MG tablet Take 70 mg by mouth every Saturday. Take with a full glass of water on an empty stomach.    Yes [provider]  amLODipine (NORVASC) 2.5 MG tablet Take 1 tablet (2.5 mg total) by mouth daily. 12/12/16  Yes Emokpae, Courage, MD  aspirin 81 MG tablet Take 81 mg by mouth daily.   Yes [provider]  escitalopram (LEXAPRO) 20 MG tablet Take 20 mg by mouth daily.    Yes [provider]  Fluticasone-Umeclidin-Vilant (TRELEGY ELLIPTA) 100-62.5-25 MCG/INH AEPB Inhale 1 puff into the lungs daily. 02/26/17  Yes [place], [place] A, MD  furosemide (LASIX) 40 MG tablet Take 1 tablet (40 mg total) by mouth 2 (two) times daily. 05/08/17 05/03/18 Yes Turner, Eber Hong, MD  guaifenesin (HUMIBID E) 400 MG TABS tablet Take 800 mg by mouth 2 (two) times daily.   Yes [provider]  linaclotide (LINZESS) 145 MCG CAPS capsule Take 145 mcg by mouth daily as needed.    Yes [provider]  metoprolol (LOPRESSOR) 50 MG tablet Take 1 tablet (50 mg total) by mouth 2 (two) times daily. 12/11/16  Yes Emokpae, Courage, MD  omeprazole (PRILOSEC) 40 MG capsule Take 40 mg by mouth daily.  11/06/14  Yes [provider]  ondansetron (ZOFRAN) 4 MG tablet Take 1 tablet (4 mg total) by mouth every 6 (six) hours as needed for nausea. 12/11/16  Yes Emokpae, Courage, MD  oxyCODONE-acetaminophen (PERCOCET/ROXICET) 5-325 MG tablet Take 1 tablet by mouth every 8 (eight) hours as needed for moderate pain. 12/13/16  Yes Bonnielee Haff, MD  predniSONE (DELTASONE) 20 MG tablet Take 20 mg  by mouth daily with breakfast.   Yes [provider]  ranitidine (ZANTAC) 150 MG tablet Take 1 tablet by mouth 2 (two) times daily. 11/06/14  Yes [provider]  tiotropium (SPIRIVA) 18 MCG inhalation capsule Place 18 mcg into inhaler and inhale daily.   Yes [provider]  Vitamin D, Ergocalciferol, (DRISDOL) 50000 UNITS CAPS capsule Take 50,000 Units by mouth every Saturday.  11/06/14  Yes [provider]  potassium chloride SA (K-DUR,KLOR-CON) 20 MEQ tablet Take 1 tablet (20 mEq total) by mouth daily. Patient not taking: Reported on 05/20/2017 04/24/17   Sueanne Margarita, MD    Family History Family History  Problem Relation Age of Onset  . Emphysema Father   . Asthma Father   . Throat cancer Mother  Social History Social History  Substance Use Topics  . Smoking status: Former Smoker    Packs/day: 1.00    Years: 55.00    Types: Cigarettes    Quit date: 12/04/2012  . Smokeless tobacco: Never Used     Comment: Stopped in 2014  . Alcohol use No     Allergies   Sulfa antibiotics and Tape   Review of Systems Review of Systems  Respiratory: Positive for shortness of breath.   All other systems reviewed and are negative.    Physical Exam Updated Vital Signs BP (!) 149/131 (BP Location: Left Arm)   Pulse 96   Temp 98.2 F (36.8 C) (Oral)   Resp 20   SpO2 96%   Physical Exam  Constitutional: She appears well-developed and well-nourished.  HENT:  Head: Normocephalic and atraumatic.  Right Ear: External ear normal.  Left Ear: External ear normal.  Nose: Nose normal.  Mouth/Throat: Oropharynx is clear and moist.  Eyes: Conjunctivae and EOM are normal. Pupils are equal, round, and reactive to light.  Neck: Normal range of motion. Neck supple.  Cardiovascular: Normal rate, regular rhythm, normal heart sounds and intact distal pulses.   Pulmonary/Chest: She has wheezes.  Abdominal: Soft. Bowel sounds are normal.  Musculoskeletal:  Normal range of motion.  Neurological: She is alert.  Pt knows her name.  She thinks she's at [facility].  She thinks it's January and 1918.  Nursing note and vitals reviewed.    ED Treatments / Results  Labs (all labs ordered are listed, but only abnormal results are displayed) Labs Reviewed  CBC WITH DIFFERENTIAL/PLATELET - Abnormal; Notable for the following:       Result Value   WBC 11.2 (*)    Hemoglobin 8.8 (*)    HCT 30.8 (*)    MCH 22.7 (*)    MCHC 28.6 (*)    RDW 18.7 (*)    Platelets 541 (*)    Neutro Abs 8.3 (*)    Monocytes Absolute 1.4 (*)    All other components within normal limits  COMPREHENSIVE METABOLIC PANEL - Abnormal; Notable for the following:    Chloride 92 (*)    CO2 36 (*)    Glucose, Bld 127 (*)    Creatinine, Ser 1.55 (*)    Calcium 8.8 (*)    Albumin 2.3 (*)    AST 46 (*)    GFR calc non Af Amer 33 (*)    GFR calc Af Amer 38 (*)    All other components within normal limits  BRAIN NATRIURETIC PEPTIDE - Abnormal; Notable for the following:    B Natriuretic Peptide 148.4 (*)    All other components within normal limits  URINALYSIS, ROUTINE W REFLEX MICROSCOPIC - Abnormal; Notable for the following:    APPearance HAZY (*)    Hgb urine dipstick TRACE (*)    All other components within normal limits  BLOOD GAS, VENOUS - Abnormal; Notable for the following:    pCO2, Ven 77.8 (*)    Bicarbonate 41.1 (*)    Acid-Base Excess 13.5 (*)    All other components within normal limits  URINALYSIS, MICROSCOPIC (REFLEX) - Abnormal; Notable for the following:    Squamous Epithelial / LPF 6-30 (*)    All other components within normal limits  TROPONIN I    EKG  EKG Interpretation  Date/Time:  Sunday May 20 2017 14:52:42 EDT Ventricular Rate:  82 PR Interval:    QRS Duration: 138 QT Interval:  453 QTC Calculation: 530 R Axis:   30 Text Interpretation:  Sinus rhythm Left bundle branch block No significant change since last tracing Confirmed by  Isla Pence [phone]) on 05/20/2017 3:22:48 PM       Radiology Dg Chest 2 View  Result Date: 05/20/2017 CLINICAL DATA:  Confusion and shortness of breath. EXAM: CHEST  2 VIEW COMPARISON:  PET-CT 05/04/2017 FINDINGS: The heart is enlarged but appears stable. Diffuse pulmonary metastatic disease is again demonstrated. No definite acute overlying pulmonary process. No pleural effusion. The bony thorax is grossly intact. IMPRESSION: Diffuse pulmonary metastatic nodules. No acute overlying pulmonary process. Electronically Signed   By: Marijo Sanes M.D.   On: 05/20/2017 15:44   Ct Head Wo Contrast  Result Date: 05/20/2017 CLINICAL DATA:  Initial evaluation for acute mental status changes, confusion. EXAM: CT HEAD WITHOUT CONTRAST TECHNIQUE: Contiguous axial images were obtained from the base of the skull through the vertex without intravenous contrast. COMPARISON:  None available. FINDINGS: Brain: Age-related cerebral atrophy with mild chronic small vessel ischemic disease. Small focus of encephalomalacia at the right frontal operculum like related to small remote ischemic infarct. No evidence for acute intracranial hemorrhage. No acute large vessel territory infarct. No mass lesion, midline shift or mass effect. No hydrocephalus. No extra-axial fluid collection. Vascular: No hyperdense vessel. Scattered vascular calcifications noted within the carotid siphons. Skull: Scalp soft tissues and calvarium within normal limits. Sinuses/Orbits: Globes and orbital soft tissues within normal limits. Patient status post lens extraction bilaterally. Paranasal sinuses and mastoids are clear. IMPRESSION: 1. No acute intracranial process. 2. 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 Electronically Signed   By: Jeannine Boga M.D.   On: 05/20/2017 17:00   PET scan results from 05/04/17:  IMPRESSION: 1. 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 Recommend urology consultation. 2. Intensely hypermetabolic liver metastasis within both hepatic lobes. 3. Bilateral multiple intensely hypermetabolic pulmonary metastasis. 4. Multiple foci of hypermetabolic skeletal metastasis. 5. Potential biopsy sites could include the liver and lumbar spine. The pelvic lesions also potentially but lesions are difficult to define on the CT portion exam. These results will be called to the ordering clinician or representative by the Radiologist Assistant, and communication documented in the PACS or zVision Dashboard.  Procedures Procedures (including critical care time)  Medications Ordered in ED Medications  albuterol (PROVENTIL) (2.5 MG/3ML) 0.083% nebulizer solution 5 mg (5 mg Nebulization Given 05/20/17 1500)  methylPREDNISolone sodium succinate (SOLU-MEDROL) 125 mg/2 mL injection 125 mg (125 mg Intravenous Given 05/20/17 1459)     Initial Impression / Assessment and Plan / ED Course  I have reviewed the triage vital signs and the nursing notes.  Pertinent labs & imaging results that were available during my care of the patient were reviewed by me and considered in my medical decision making (see chart for details).    VBG and COPD at baseline.  Pt has no evidence of mets to brain.  No family here even after hours of observation.  Pt is stable for d/c. She knows to wear her bipap and f/u with pcp.  Final Clinical Impressions(s) / ED Diagnoses   Final diagnoses:  Chronic anemia  CRI (chronic renal insufficiency), stage 3 (moderate)  Malignant neoplasm metastatic to lung, unspecified  laterality ([place])  COPD exacerbation ([place])    New Prescriptions New Prescriptions   No medications on file     Isla Pence,  MD 05/20/17 1705

## 2017-05-23 ENCOUNTER — Other Ambulatory Visit: Payer: Medicare HMO | Admitting: *Deleted

## 2017-05-23 DIAGNOSIS — R0602 Shortness of breath: Secondary | ICD-10-CM | POA: Diagnosis not present

## 2017-05-24 LAB — BASIC METABOLIC PANEL
BUN/Creatinine Ratio: 16 (ref 12–28)
BUN: 25 mg/dL (ref 8–27)
CALCIUM: 8.4 mg/dL — AB (ref 8.7–10.3)
CHLORIDE: 90 mmol/L — AB (ref 96–106)
CO2: 33 mmol/L — ABNORMAL HIGH (ref 20–29)
Creatinine, Ser: 1.52 mg/dL — ABNORMAL HIGH (ref 0.57–1.00)
GFR calc Af Amer: 39 mL/min/{1.73_m2} — ABNORMAL LOW (ref >59)
GFR calc non Af Amer: 34 mL/min/{1.73_m2} — ABNORMAL LOW (ref >59)
GLUCOSE: 128 mg/dL — AB (ref 65–99)
Potassium: 4.3 mmol/L (ref 3.5–5.2)
Sodium: 142 mmol/L (ref 134–144)

## 2017-05-24 LAB — PRO B NATRIURETIC PEPTIDE: NT-PRO BNP: 1994 pg/mL — AB (ref 0–301)

## 2017-05-26 DIAGNOSIS — I5042 Chronic combined systolic (congestive) and diastolic (congestive) heart failure: Secondary | ICD-10-CM | POA: Diagnosis not present

## 2017-05-26 DIAGNOSIS — Z9181 History of falling: Secondary | ICD-10-CM | POA: Diagnosis not present

## 2017-05-26 DIAGNOSIS — J9601 Acute respiratory failure with hypoxia: Secondary | ICD-10-CM | POA: Diagnosis not present

## 2017-05-26 DIAGNOSIS — M6281 Muscle weakness (generalized): Secondary | ICD-10-CM | POA: Diagnosis not present

## 2017-05-28 ENCOUNTER — Telehealth: Payer: Self-pay | Admitting: Oncology

## 2017-05-28 NOTE — Telephone Encounter (Signed)
Spoke with patient re 7/2 new patient appointment with Dr. Alen Blew at 11:30 am to arrive 11 am. Demographic and insurance information confirmed.

## 2017-05-31 ENCOUNTER — Telehealth: Payer: Self-pay | Admitting: Oncology

## 2017-05-31 NOTE — Telephone Encounter (Signed)
Per message from [facility] moved new patient appointment from 7/2 to 7/3. Patient was scheduled 7/3 in 2 pm new patient slot as this time has now become available.   Spoke with patient re change and new appointment for 7/3 @ 2 pm to arrive 1:30 pm. Also left message informing Bethena Amanda Horton at [employer]

## 2017-06-04 ENCOUNTER — Ambulatory Visit: Payer: Medicare HMO | Admitting: Oncology

## 2017-06-05 ENCOUNTER — Ambulatory Visit (HOSPITAL_BASED_OUTPATIENT_CLINIC_OR_DEPARTMENT_OTHER): Payer: Medicare HMO | Admitting: Oncology

## 2017-06-05 VITALS — BP 92/50 | HR 83 | Temp 97.8°F | Resp 18 | Ht 62.0 in | Wt 199.3 lb

## 2017-06-05 DIAGNOSIS — C801 Malignant (primary) neoplasm, unspecified: Secondary | ICD-10-CM

## 2017-06-05 DIAGNOSIS — C7800 Secondary malignant neoplasm of unspecified lung: Secondary | ICD-10-CM | POA: Diagnosis not present

## 2017-06-05 DIAGNOSIS — C787 Secondary malignant neoplasm of liver and intrahepatic bile duct: Secondary | ICD-10-CM | POA: Diagnosis not present

## 2017-06-05 NOTE — Progress Notes (Signed)
Reason for Referral: Metastatic cancer.   HPI: 71 year old woman with past medical history significant for degenerative arthritis, chronic renal insufficiency and a COPD. She is oxygen dependent as well as limited in her mobility to a chair with very limited ability to ambulate. She started developing symptoms of worsening shortness of breath and her workup included a CT scan of the chest which showed bilateral pulmonary nodules suspicious for malignancy. She subsequently had a PET CT scan on 05/04/2017 which showed a PET avid pulmonary nodules consistent with malignancy. She also had multiple hepatic lesions in addition to skeletal lesions as well. She has severe right hydronephrosis with a mass appearance around the mid right ureter. She was evaluated by Dr. 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 She had no symptoms related due to her hydronephrosis and no intervention is needed. Clinically, she is minimally symptomatic from her disease although she is quite debilitated overall. She denied any hematuria or flank pain. She denied any worsening shortness of breath at rest. Her performance status is very poor with very limited quality of life.  She denied any headaches, blurry vision, syncope or seizures. She did have a fall that required surgical repair and past. She does not report any fevers, chills or sweats. She does not report any cough, wheezing but does report dyspnea on minimal exertion. She does not report any nausea, vomiting or abdominal pain. She does not report any constipation or diarrhea. She does not report any frequency urgency or hesitancy. She does not report any hematuria. She does not report any skin rashes or lesions. Remaining review of systems unremarkable.   Past Medical History:  Diagnosis Date  . Allergic rhinitis   . Anemia due to chronic kidney disease   . APC (atrial premature contractions)   . Arthritis  of lumbar spine ([place])   . Benign essential hypertension   . Borderline diabetes   . Chronic insomnia   . Chronic venous insufficiency   . CKD (chronic kidney disease), stage III   . COPD (chronic obstructive pulmonary disease) ([place])   . GERD (gastroesophageal reflux disease)   . Hyperlipemia   . Hypoxia   . Lumbar disc narrowing   . On home oxygen therapy   . OSA (obstructive sleep apnea)   . Osteoporosis   . Respiratory failure requiring intubation ([place])   . Spinal stenosis of lumbar region without neurogenic claudication   . Varicose veins with pain   . Vitamin B12 deficiency   . Vitamin D deficiency   :  Past Surgical History:  Procedure Laterality Date  . APPLICATION OF A-CELL OF EXTREMITY Right 08/30/2016   Procedure: APPLICATION OF A-CELL OF EXTREMITY;  Surgeon: Loel Lofty Dillingham, DO;  Location: [place];  Service: Plastics;  Laterality: Right;  . APPLICATION OF A-CELL OF EXTREMITY Right 09/20/2016   Procedure: APPLICATION OF A-CELL OF right lower leg wound;  Surgeon: Loel Lofty Dillingham, DO;  Location: [place];  Service: Plastics;  Laterality: Right;  . APPLICATION OF WOUND VAC Right 08/30/2016   Procedure: APPLICATION OF WOUND VAC;  Surgeon: Loel Lofty Dillingham, DO;  Location: [place];  Service: Plastics;  Laterality: Right;  . APPLICATION OF WOUND VAC Right 09/20/2016   Procedure: APPLICATION OF WOUND VAC right lower leg wound;  Surgeon: Loel Lofty Dillingham, DO;  Location: [place];  Service: Plastics;  Laterality: Right;  . I&D EXTREMITY Right 08/30/2016   Procedure: IRRIGATION AND DEBRIDEMENT EXTREMITY RIGHT;  Surgeon:  Loel Lofty Dillingham, DO;  Location: [place];  Service: Plastics;  Laterality: Right;  . INCISION AND DRAINAGE OF WOUND Right 09/20/2016   Procedure: IRRIGATION AND DEBRIDEMENT right lower leg wound;  Surgeon: Loel Lofty Dillingham, DO;  Location: [place];  Service: Plastics;  Laterality: Right;  . PARTIAL HYSTERECTOMY    :   Current Outpatient Prescriptions:  .   alendronate (FOSAMAX) 70 MG tablet, Take 70 mg by mouth every Saturday. Take with a full glass of water on an empty stomach. , Disp: , Rfl:  .  amLODipine (NORVASC) 2.5 MG tablet, Take 1 tablet (2.5 mg total) by mouth daily., Disp: 30 tablet, Rfl: 1 .  aspirin 81 MG tablet, Take 81 mg by mouth daily., Disp: , Rfl:  .  escitalopram (LEXAPRO) 20 MG tablet, Take 20 mg by mouth daily. , Disp: , Rfl:  .  Fluticasone-Umeclidin-Vilant (TRELEGY ELLIPTA) 100-62.5-25 MCG/INH AEPB, Inhale 1 puff into the lungs daily., Disp: 30 each, Rfl: 5 .  furosemide (LASIX) 40 MG tablet, Take 1 tablet (40 mg total) by mouth 2 (two) times daily., Disp: 60 tablet, Rfl: 11 .  furosemide (LASIX) 80 MG tablet, TAKE 1 TABLET EVERY MORNING AND 1/2 TABLET EVERY AFTERNOON, Disp: , Rfl: 11 .  guaifenesin (HUMIBID E) 400 MG TABS tablet, Take 800 mg by mouth 2 (two) times daily., Disp: , Rfl:  .  linaclotide (LINZESS) 145 MCG CAPS capsule, Take 145 mcg by mouth daily as needed. , Disp: , Rfl:  .  metoprolol (LOPRESSOR) 50 MG tablet, Take 1 tablet (50 mg total) by mouth 2 (two) times daily., Disp: 60 tablet, Rfl: 1 .  omeprazole (PRILOSEC) 40 MG capsule, Take 40 mg by mouth daily. , Disp: , Rfl:  .  ondansetron (ZOFRAN) 4 MG tablet, Take 1 tablet (4 mg total) by mouth every 6 (six) hours as needed for nausea., Disp: 20 tablet, Rfl: 0 .  oxyCODONE-acetaminophen (PERCOCET/ROXICET) 5-325 MG tablet, Take 1 tablet by mouth every 8 (eight) hours as needed for moderate pain., Disp: 10 tablet, Rfl: 0 .  potassium chloride SA (K-DUR,KLOR-CON) 20 MEQ tablet, Take 1 tablet (20 mEq total) by mouth daily., Disp: 90 tablet, Rfl: 3 .  predniSONE (DELTASONE) 20 MG tablet, Take 20 mg by mouth daily with breakfast., Disp: , Rfl:  .  ranitidine (ZANTAC) 150 MG tablet, Take 1 tablet by mouth 2 (two) times daily., Disp: , Rfl:  .  tiotropium (SPIRIVA) 18 MCG inhalation capsule, Place 18 mcg into inhaler and inhale daily., Disp: , Rfl:  .  Vitamin D,  Ergocalciferol, (DRISDOL) 50000 UNITS CAPS capsule, Take 50,000 Units by mouth every Saturday. , Disp: , Rfl:  .  albuterol (PROVENTIL) (2.5 MG/3ML) 0.083% nebulizer solution, Take 3 mLs (2.5 mg total) by nebulization every 4 (four) hours as needed for wheezing or shortness of breath. (Patient taking differently: Take 2.5 mg by nebulization every 6 (six) hours as needed for wheezing or shortness of breath. ), Disp: 75 mL, Rfl: 0:  Allergies  Allergen Reactions  . Sulfa Antibiotics Nausea And Vomiting and Other (See Comments)  . Tape Other (See Comments)    SKIN IS VERY THIN AND WILL BRUISE & TEAR EASILY!!  :  Family History  Problem Relation Age of Onset  . Emphysema Father   . Asthma Father   . Throat cancer Mother   :  Social History   Social History  . Marital status: Single    Spouse name: N/A  . Number of children:  N/A  . Years of education: N/A   Occupational History  . Retired     [employer]   Social History Main Topics  . Smoking status: Former Smoker    Packs/day: 1.00    Years: 55.00    Types: Cigarettes    Quit date: 12/04/2012  . Smokeless tobacco: Never Used     Comment: Stopped in 2014  . Alcohol use No  . Drug use: No  . Sexual activity: Not on file   Other Topics Concern  . Not on file   Social History Narrative  . No narrative on file  :  Pertinent items are noted in HPI.  Exam: Blood pressure (!) 92/50, pulse 83, temperature 97.8 F (36.6 C), temperature source Oral, resp. rate 18, height 5\' 2"  (1.575 m), weight 199 lb 4.8 oz (90.4 kg), SpO2 93 %.  ECOG 3 General appearance: alert and cooperative appeared without distress. Throat: No oral thrush. Back: negative Resp: Wheezes auscultated bilaterally. Chest wall: no tenderness Cardio: regular rate and rhythm, S1, S2 normal, no murmur, click, rub or gallop GI: soft, non-tender; bowel sounds normal; no masses,  no organomegaly Extremities: extremities normal, atraumatic, no cyanosis or  edema Pulses: 2+ and symmetric Skin: Skin color, texture, turgor normal. No rashes or lesions Lymph nodes: Cervical, supraclavicular, and axillary nodes normal.  CBC    Component Value Date/Time   WBC 11.2 (H) 05/20/2017 1500   RBC 3.88 05/20/2017 1500   HGB 8.8 (L) 05/20/2017 1500   HCT 30.8 (L) 05/20/2017 1500   PLT 541 (H) 05/20/2017 1500   MCV 79.4 05/20/2017 1500   MCH 22.7 (L) 05/20/2017 1500   MCHC 28.6 (L) 05/20/2017 1500   RDW 18.7 (H) 05/20/2017 1500   LYMPHSABS 1.5 05/20/2017 1500   MONOABS 1.4 (H) 05/20/2017 1500   EOSABS 0.0 05/20/2017 1500   BASOSABS 0.0 05/20/2017 1500     Ct Head Wo Contrast  Result Date: 05/20/2017 CLINICAL DATA:  Initial evaluation for acute mental status changes, confusion. EXAM: CT HEAD WITHOUT CONTRAST TECHNIQUE: Contiguous axial images were obtained from the base of the skull through the vertex without intravenous contrast. COMPARISON:  None available. FINDINGS: Brain: Age-related cerebral atrophy with mild chronic small vessel ischemic disease. Small focus of encephalomalacia at the right frontal operculum like related to small remote ischemic infarct. No evidence for acute intracranial hemorrhage. No acute large vessel territory infarct. No mass lesion, midline shift or mass effect. No hydrocephalus. No extra-axial fluid collection. Vascular: No hyperdense vessel. Scattered vascular calcifications noted within the carotid siphons. Skull: Scalp soft tissues and calvarium within normal limits. Sinuses/Orbits: Globes and orbital soft tissues within normal limits. Patient status post lens extraction bilaterally. Paranasal sinuses and mastoids are clear. IMPRESSION: 1. No acute intracranial process. 2. 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 Electronically Signed   By: Jeannine Boga M.D.   On: 05/20/2017 17:00   CLINICAL DATA:  Initial treatment strategy  for pulmonary nodules. Suspicion for metastasis. Unknown primary  EXAM: NUCLEAR MEDICINE PET SKULL BASE TO THIGH  TECHNIQUE: 10.8 mCi F-18 FDG was injected intravenously. Full-ring PET imaging was performed from the skull base to thigh after the radiotracer. CT data was obtained and used for attenuation correction and anatomic localization.  FASTING BLOOD GLUCOSE:  Value: 124 mg/dl  COMPARISON:  CT chest 04/20/2017  FINDINGS: NECK  No hypermetabolic lymph nodes in the neck.  CHEST  Multiple intensely hypermetabolic  pulmonary nodules present within both lungs. These nodules of varying size have intense metabolic activity. Example nodule in the LEFT upper lobe measures 14 mm (image 47, series 4) with SUV max 27. Example nodule within the RIGHT lower lobe measures 11 mm with SUV max equal 14.9 (image 77)  ABDOMEN/PELVIS  Large lesion in the LEFT hepatic lobe measures 6.5 by 3.9 cm with SUV max equal 23. Smaller adjacent LEFT hepatic lobe lesion measuring 2.6 cm with SUV max equal 16.7. There are smaller lesions within the RIGHT hepatic lobe.  Severe hydronephrosis of the RIGHT kidney and RIGHT renal pelvis. Soft tissue expansion of the mid and distal RIGHT ureter measuring up to 2.3 cm in diameter (image 133, series 4). This soft tissue expansion of the RIGHT ureter is intensely hypermetabolic over a long course with SUV max equal 21.4.  The LEFT kidney and ureter appear normal.  Bladder is grossly normal  SKELETON  Multiple sites of intense radiotracer activity within the skeleton consistent skeletal metastasis. These lesions are not well depicted on CT exam. Example lesion in the RIGHT acetabulum with SUV max equal 21. Adjacent lesions at L2 and L3 with SUV max equal 23.  IMPRESSION: 1. 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 Recommend urology consultation. 2. Intensely hypermetabolic liver metastasis within both hepatic lobes. 3. Bilateral multiple intensely hypermetabolic pulmonary metastasis. 4. Multiple foci of hypermetabolic skeletal metastasis. 5. Potential biopsy sites could include the liver and lumbar spine. The pelvic lesions also potentially but lesions are difficult to define on the CT portion exam. These results will be called to the ordering clinician or representative by the Radiologist Assistant, and communication documented in the PACS or zVision Dashboard.   Assessment and Plan:   71 year old woman with the following issues:  1. Metastatic malignancy likely arising from the genitourinary tract and the right ureter. She has evidence of metastatic disease to the lung, liver and possibly skeletal metastasis. She does not report any hematuria or discomfort from these findings at this time. Her PET scan on 05/04/2017 showed a rather extensive and bulky disease.  The natural course of this presumed illness was discussed today with the patient and her family. The ideal sequence of events would require a tissue biopsy most likely from the liver to confirm these findings. Risks and benefits of this procedure was reviewed today which includes local anesthesia complications pain and bleeding. After obtaining a tissue biopsy and confirming the nature of metastatic malignancy her treatment options are limited. Surgery and radiation therapy has no role and the only option would be palliative systemic therapy.  She is not a candidate for systemic chemotherapy. Single agent immunotherapy and be used in the setting but I doubt will make any meaningful impact given the volume of disease. Her performance status is rather poor and her quality of life is very limited. This on these findings, I have recommended supportive care only and  potentially hospice enrollment if she develops symptoms. I anticipate that she will require hospice care in the immediate future.  This plan was discussed today in detail with the patient and her children and she declined a biopsy as it will not impact her decision for treatment at this time. I agree with her decision to forego the biopsy and proceed with supportive care only.  2. Prognosis:  Very poor with limited life expectancy likely in single digit months.  3. Follow-up: No follow-up will be scheduled and I'm happy to assist in her care in the future as needed.

## 2017-06-18 DIAGNOSIS — J449 Chronic obstructive pulmonary disease, unspecified: Secondary | ICD-10-CM | POA: Diagnosis not present

## 2017-06-18 DIAGNOSIS — M899 Disorder of bone, unspecified: Secondary | ICD-10-CM | POA: Diagnosis not present

## 2017-06-18 DIAGNOSIS — R0902 Hypoxemia: Secondary | ICD-10-CM | POA: Diagnosis not present

## 2017-06-18 DIAGNOSIS — M47817 Spondylosis without myelopathy or radiculopathy, lumbosacral region: Secondary | ICD-10-CM | POA: Diagnosis not present

## 2017-06-25 DIAGNOSIS — M6281 Muscle weakness (generalized): Secondary | ICD-10-CM | POA: Diagnosis not present

## 2017-06-25 DIAGNOSIS — I5042 Chronic combined systolic (congestive) and diastolic (congestive) heart failure: Secondary | ICD-10-CM | POA: Diagnosis not present

## 2017-06-25 DIAGNOSIS — J9601 Acute respiratory failure with hypoxia: Secondary | ICD-10-CM | POA: Diagnosis not present

## 2017-06-25 DIAGNOSIS — Z9181 History of falling: Secondary | ICD-10-CM | POA: Diagnosis not present

## 2017-06-28 ENCOUNTER — Encounter: Payer: Self-pay | Admitting: *Deleted

## 2017-06-28 NOTE — Progress Notes (Signed)
Faxed notes to [facility] attention Nancy Marus, Hospice nurse. Fax # [phone].

## 2017-07-02 ENCOUNTER — Telehealth: Payer: Self-pay | Admitting: *Deleted

## 2017-07-02 NOTE — Telephone Encounter (Signed)
"  Amanda Horton S.W. [facility] in [place].  This patient expired 2017/07/02 at 3:40 pm at the [facility].  Call us at [phone] if any questions."

## 2017-07-02 NOTE — Telephone Encounter (Signed)
Received call from [address] [facility] at [facility] that patient passed away on 07/15/2017 @ 3:40 am. Message sent to scheduling.

## 2017-07-03 ENCOUNTER — Ambulatory Visit: Payer: Medicare HMO | Admitting: Pulmonary Disease

## 2017-07-04 DEATH — deceased

## 2017-12-17 IMAGING — CR DG CHEST 1V PORT
1 series · 1 of 1 positions shown · non-contrast
Comparison: 12/06/2016

CLINICAL DATA: Respiratory failure. Pt c/o of SOB, but denies chest
pain.

EXAM:
PORTABLE CHEST 1 VIEW

[AP]
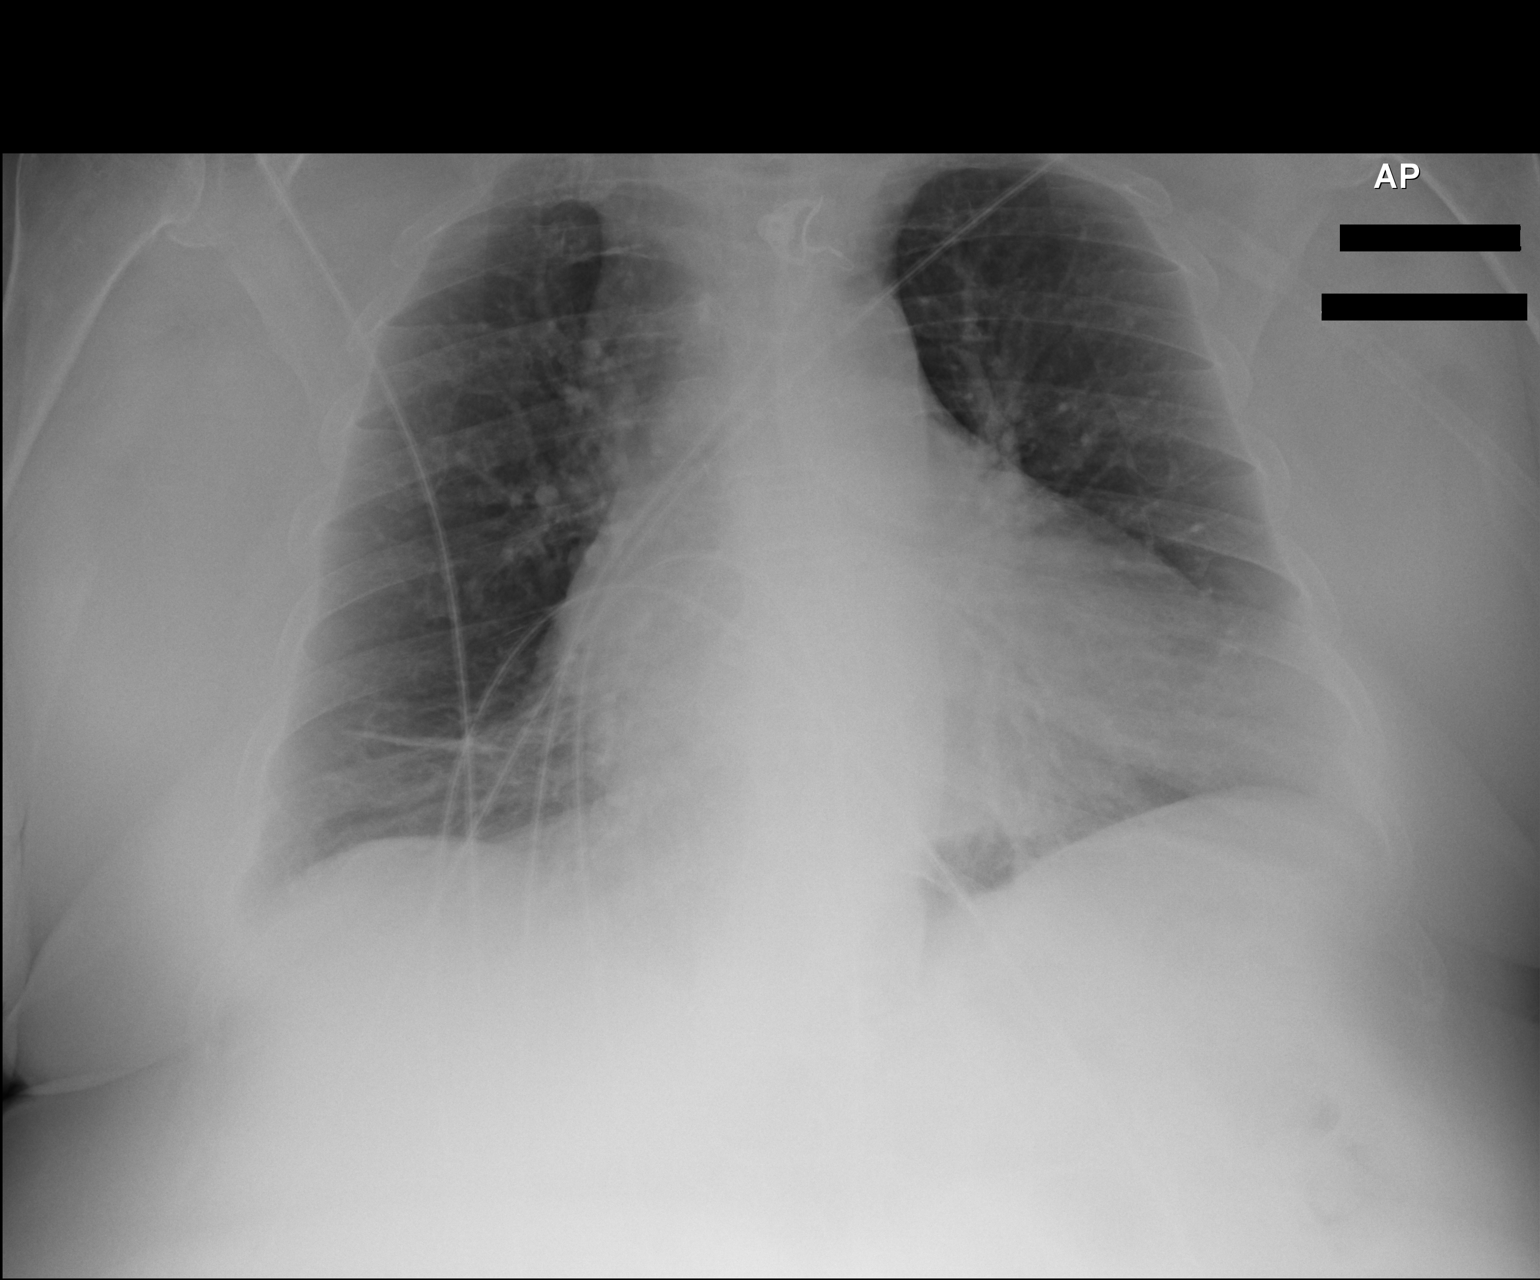

[1 of 1 positions shown; findings below may reference images not displayed]

FINDINGS: Stable mild cardiomegaly. No mediastinal or hilar masses. Mild
linear atelectasis at the lung bases, also stable. Lungs otherwise
clear.

No convincing pleural effusion.  No pneumothorax.
IMPRESSION: 1. No acute cardiopulmonary disease.
2. Mild persistent lung base atelectasis.

## 2018-05-28 IMAGING — CR DG CHEST 2V
2 series · 2 of 2 positions shown · non-contrast
Comparison: PET-CT 05/04/2017

CLINICAL DATA: Confusion and shortness of breath.

EXAM:
CHEST  2 VIEW

[w chest lat]
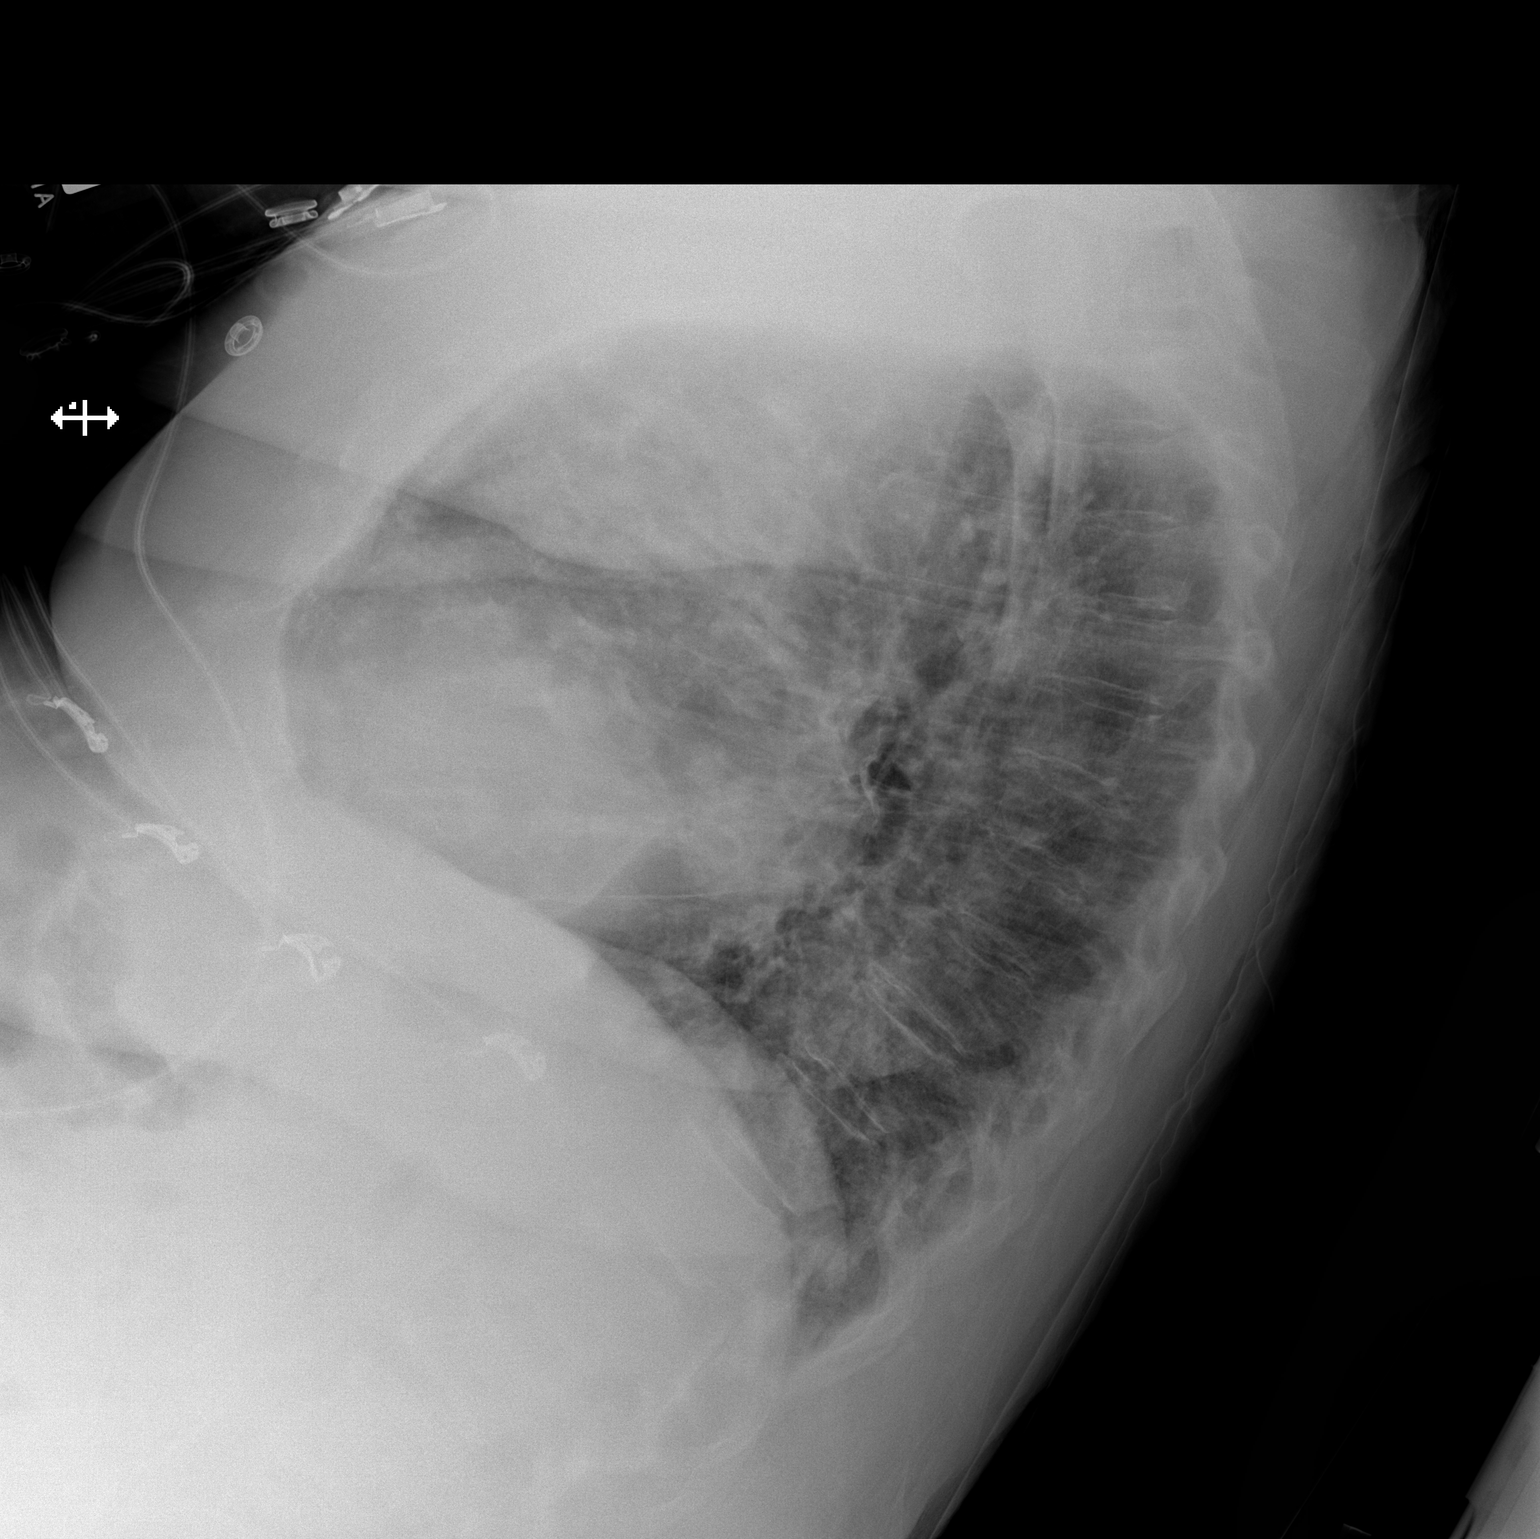

[x chest ap]
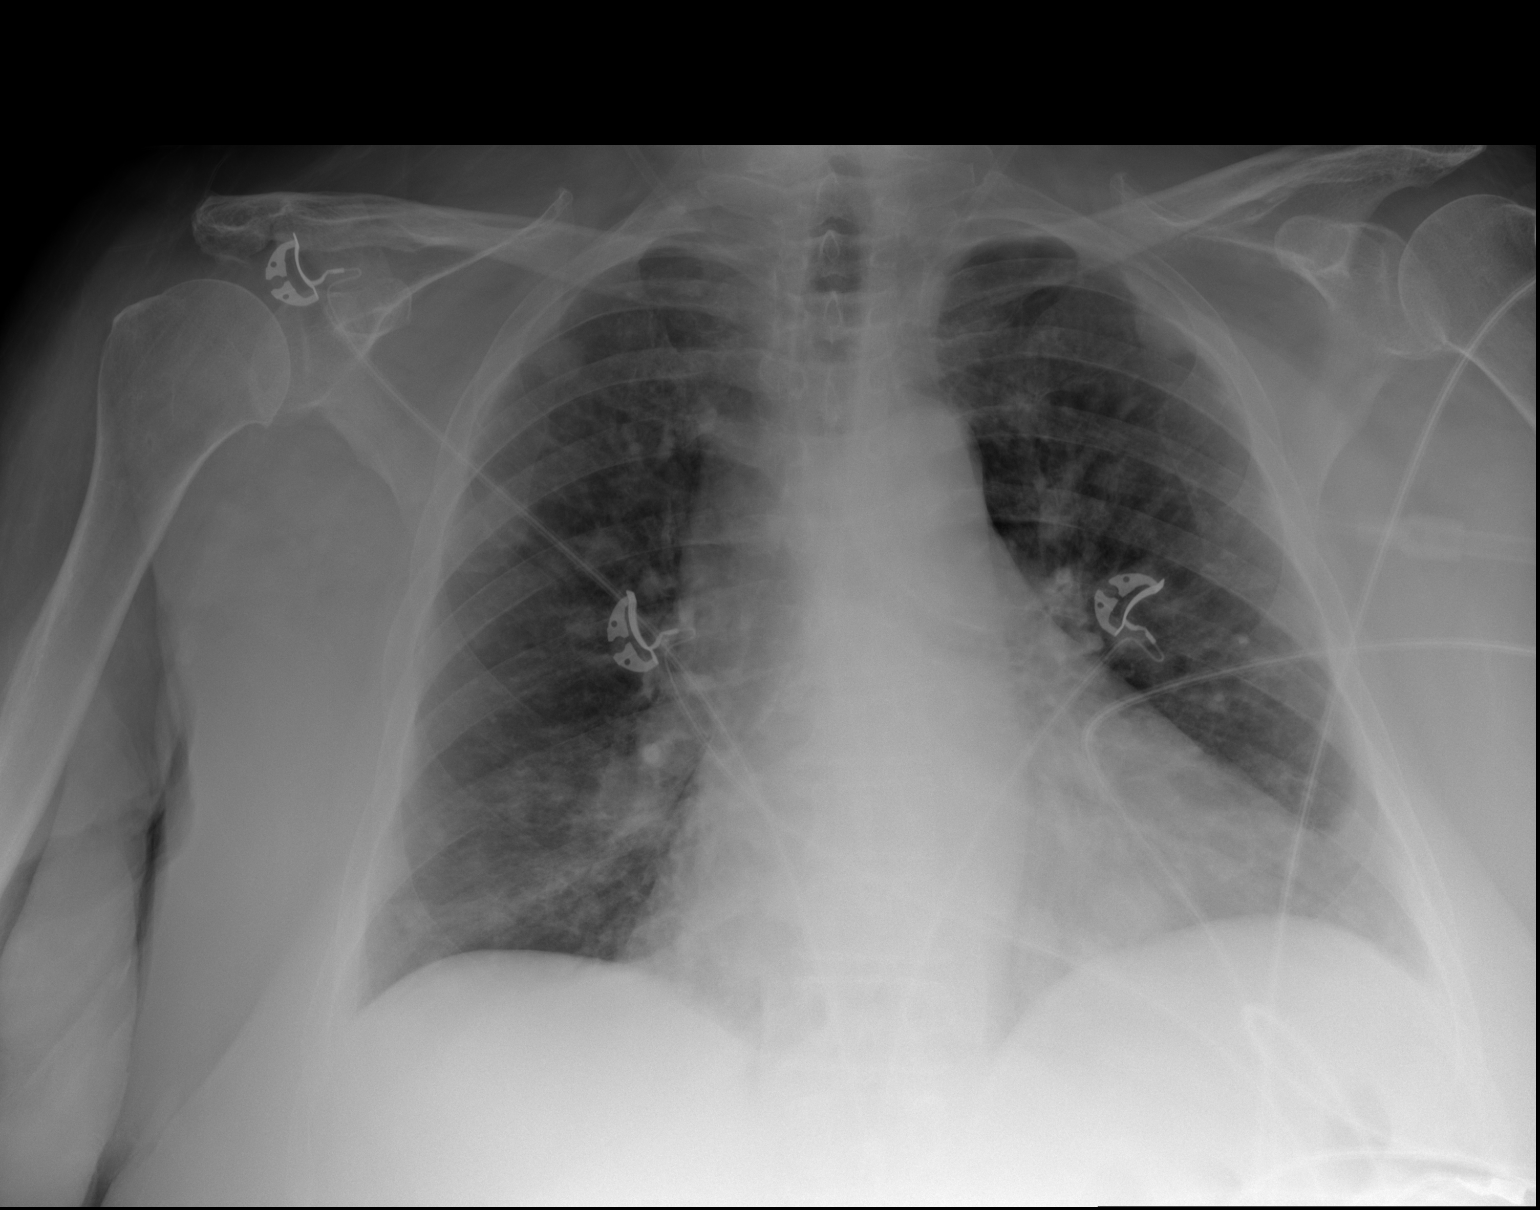

[2 of 2 positions shown; findings below may reference images not displayed]

FINDINGS: The heart is enlarged but appears stable. Diffuse pulmonary
metastatic disease is again demonstrated. No definite acute
overlying pulmonary process. No pleural effusion. The bony thorax is
grossly intact.
IMPRESSION: Diffuse pulmonary metastatic nodules. No acute overlying pulmonary
process.

## 2018-05-28 IMAGING — CT CT HEAD W/O CM
3 of 4 series · 14 of 47 positions shown, 16 images · non-contrast
Comparison: None available.

CLINICAL DATA: Initial evaluation for acute mental status changes,
confusion.

EXAM:
CT HEAD WITHOUT CONTRAST
TECHNIQUE: Contiguous axial images were obtained from the base of the skull
through the vertex without intravenous contrast.

[Series 2: head w/o · axial · non-contrast · 0.45mm/px · z∈[-117,+8]mm · 8 of 31 slices shown, 10 images]
[im 3/31  brain]
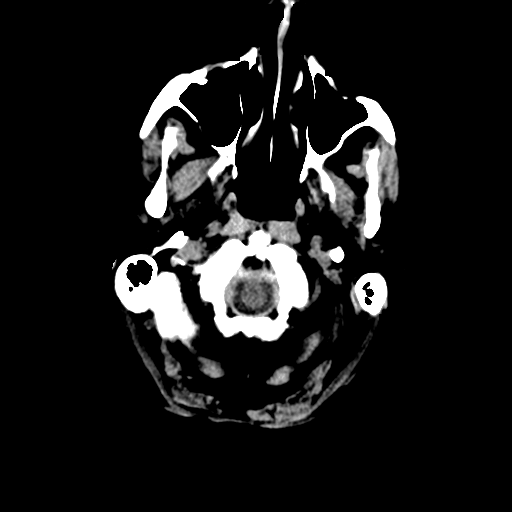
[im 3/31  bone]
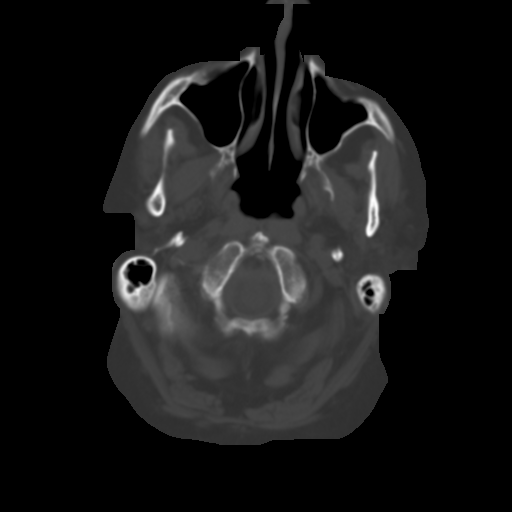
[im 7/31  brain]
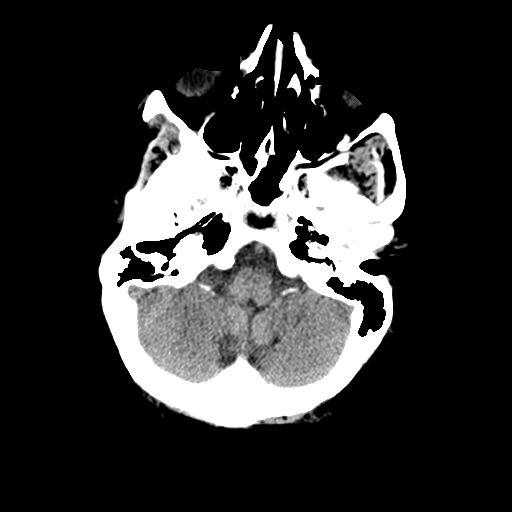
[im 11/31  brain]
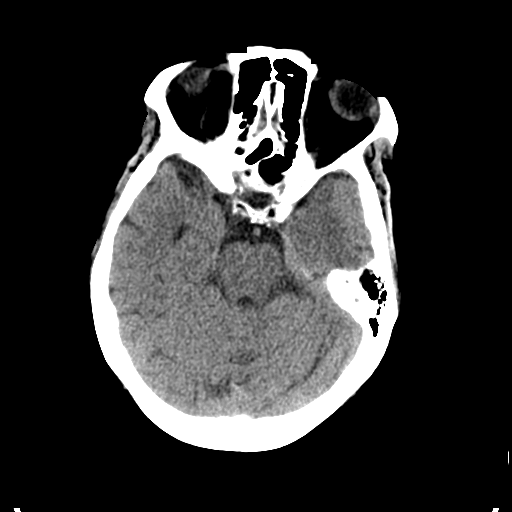
[im 13/31  brain]
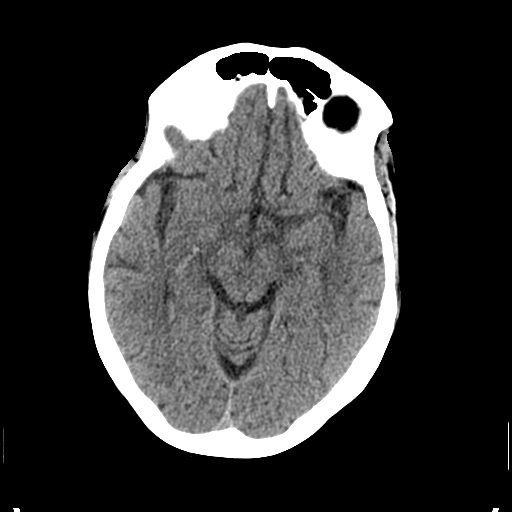
[im 18/31  brain]
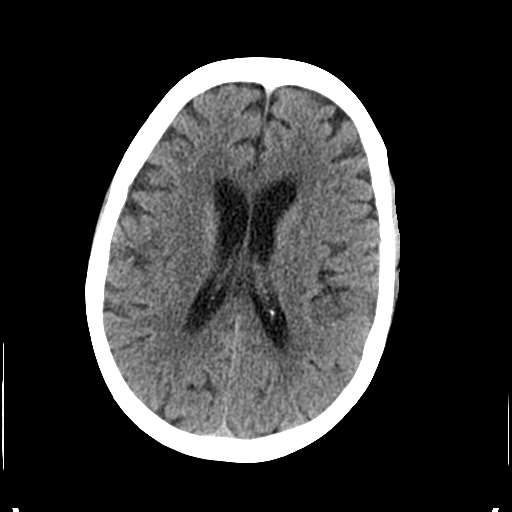
[im 18/31  bone]
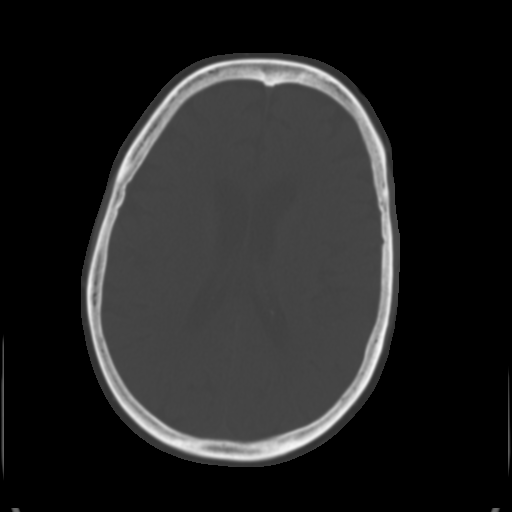
[im 20/31  brain]
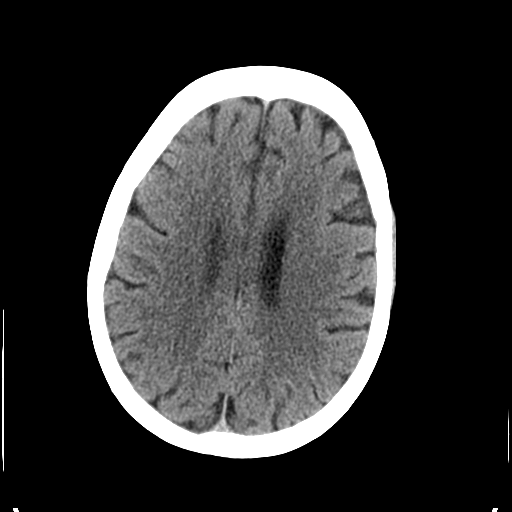
[im 24/31  brain]
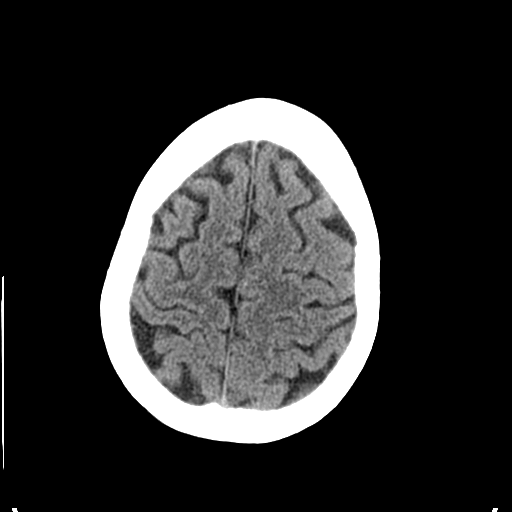
[im 28/31  brain]
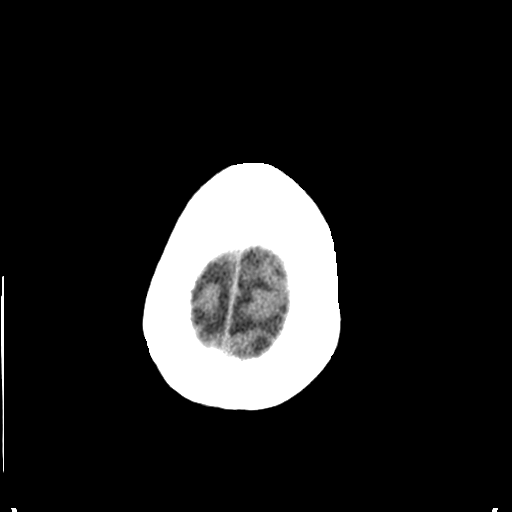

[Series 5: coronal · coronal · 0.29mm/px · 3 of 70 slices shown]
[im 24/70  brain]
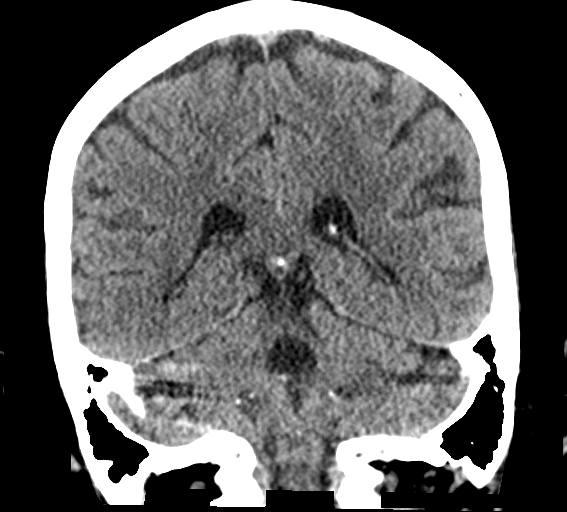
[im 31/70  brain]
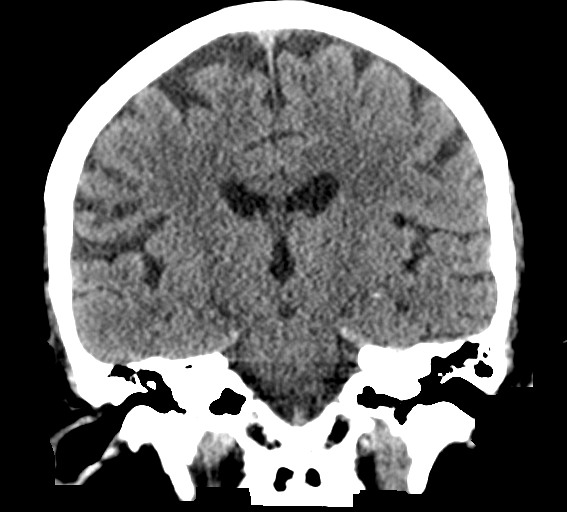
[im 39/70  brain]
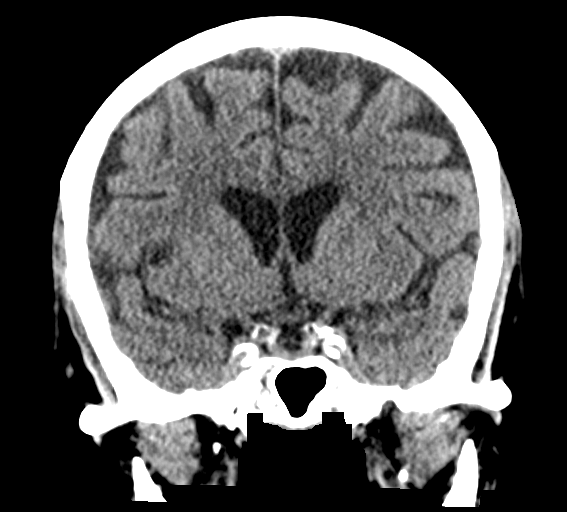

[Series 6: sagittal · sagittal · 0.29mm/px · 3 of 57 slices shown]
[im 19/57  brain]
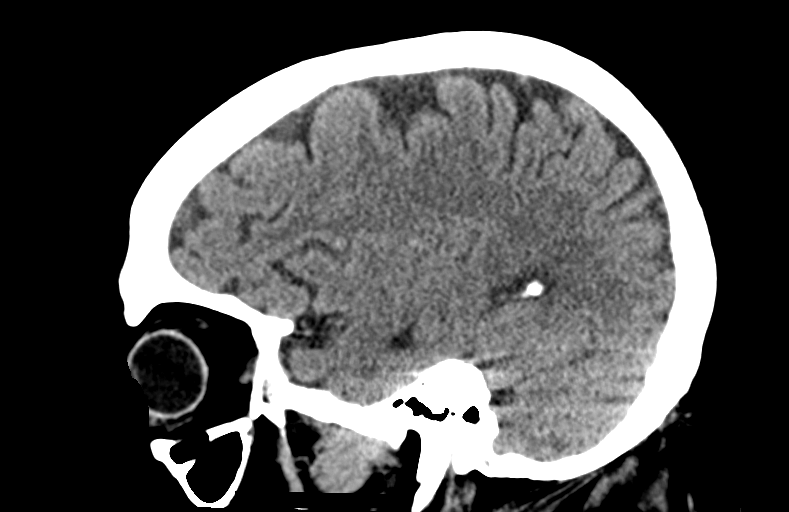
[im 29/57  brain]
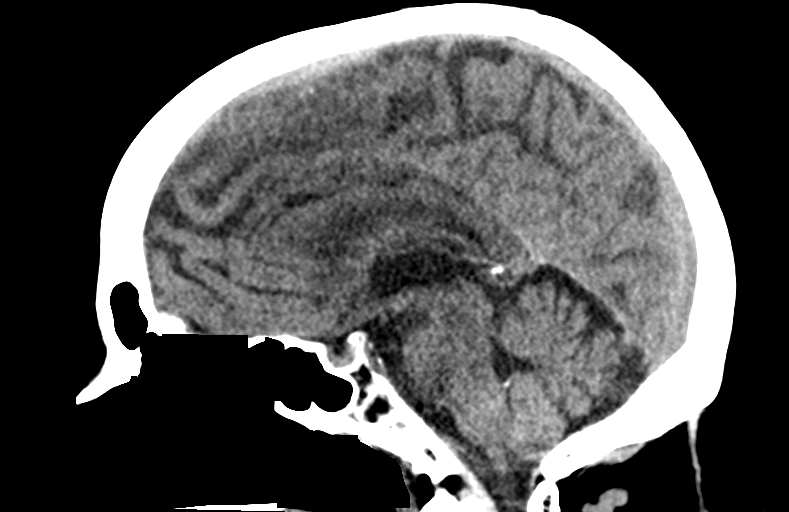
[im 38/57  brain]
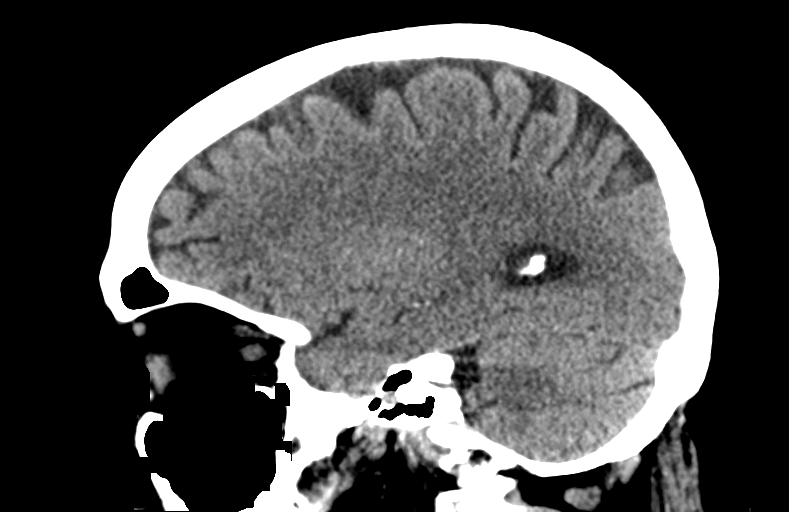

[14 of 47 positions shown; findings below may reference images not displayed]

FINDINGS: Brain: Age-related cerebral atrophy with mild chronic small vessel
ischemic disease. Small focus of encephalomalacia at the right
frontal operculum like related to small remote ischemic infarct.

No evidence for acute intracranial hemorrhage. No acute large vessel
territory infarct. No mass lesion, midline shift or mass effect. No
hydrocephalus. No extra-axial fluid collection.

Vascular: No hyperdense vessel. Scattered vascular calcifications
noted within the carotid siphons.

Skull: Scalp soft tissues and calvarium within normal limits.

Sinuses/Orbits: Globes and orbital soft tissues within normal
limits. Patient status post lens extraction bilaterally. Paranasal
sinuses and mastoids are clear.
IMPRESSION: 1. No acute intracranial process.
2. Mild atrophy with chronic small vessel ischemic disease, with
small remote ischemic infarct involving the right frontal operculum.
